# Patient Record
Sex: Female | Born: 1955
Health system: Southern US, Community
[De-identification: ages and names within clinical notes are randomized; demographics above are authoritative.]

## PROBLEM LIST (undated history)

## (undated) DIAGNOSIS — H269 Unspecified cataract: Secondary | ICD-10-CM

## (undated) DIAGNOSIS — I10 Essential (primary) hypertension: Secondary | ICD-10-CM

## (undated) DIAGNOSIS — E785 Hyperlipidemia, unspecified: Secondary | ICD-10-CM

## (undated) DIAGNOSIS — M1990 Unspecified osteoarthritis, unspecified site: Secondary | ICD-10-CM

## (undated) DIAGNOSIS — N189 Chronic kidney disease, unspecified: Secondary | ICD-10-CM

## (undated) HISTORY — PX: CATARACT EXTRACTION: SUR2

## (undated) HISTORY — DX: Chronic kidney disease, unspecified: N18.9

## (undated) HISTORY — PX: ABDOMINAL HYSTERECTOMY: SHX81

## (undated) HISTORY — PX: VAGINAL HYSTERECTOMY: SUR661

## (undated) HISTORY — DX: Unspecified osteoarthritis, unspecified site: M19.90

## (undated) HISTORY — DX: Hyperlipidemia, unspecified: E78.5

## (undated) HISTORY — DX: Unspecified cataract: H26.9

## (undated) HISTORY — PX: EYE SURGERY: SHX253

---

## 1999-06-28 DIAGNOSIS — I10 Essential (primary) hypertension: Secondary | ICD-10-CM

## 1999-06-28 HISTORY — DX: Essential (primary) hypertension: I10

## 1999-12-20 ENCOUNTER — Encounter: Admission: RE | Admit: 1999-12-20 | Discharge: 2000-03-19 | Payer: Self-pay | Admitting: Internal Medicine

## 2000-03-06 ENCOUNTER — Ambulatory Visit (HOSPITAL_COMMUNITY): Admission: RE | Admit: 2000-03-06 | Discharge: 2000-03-06 | Payer: Self-pay | Admitting: Internal Medicine

## 2000-03-06 ENCOUNTER — Encounter: Payer: Self-pay | Admitting: Internal Medicine

## 2000-10-12 ENCOUNTER — Encounter: Payer: Self-pay | Admitting: Internal Medicine

## 2000-10-12 ENCOUNTER — Ambulatory Visit (HOSPITAL_COMMUNITY): Admission: RE | Admit: 2000-10-12 | Discharge: 2000-10-12 | Payer: Self-pay | Admitting: Internal Medicine

## 2001-02-12 ENCOUNTER — Encounter: Payer: Self-pay | Admitting: Internal Medicine

## 2001-02-12 ENCOUNTER — Ambulatory Visit (HOSPITAL_COMMUNITY): Admission: RE | Admit: 2001-02-12 | Discharge: 2001-02-12 | Payer: Self-pay | Admitting: Internal Medicine

## 2002-10-28 ENCOUNTER — Ambulatory Visit (HOSPITAL_COMMUNITY): Admission: RE | Admit: 2002-10-28 | Discharge: 2002-10-28 | Payer: Self-pay | Admitting: Family Medicine

## 2003-03-05 ENCOUNTER — Emergency Department (HOSPITAL_COMMUNITY): Admission: EM | Admit: 2003-03-05 | Discharge: 2003-03-06 | Payer: Self-pay

## 2003-08-07 ENCOUNTER — Emergency Department (HOSPITAL_COMMUNITY): Admission: EM | Admit: 2003-08-07 | Discharge: 2003-08-07 | Payer: Self-pay | Admitting: Emergency Medicine

## 2005-01-14 ENCOUNTER — Ambulatory Visit (HOSPITAL_COMMUNITY): Admission: RE | Admit: 2005-01-14 | Discharge: 2005-01-14 | Payer: Self-pay | Admitting: Obstetrics and Gynecology

## 2005-07-11 ENCOUNTER — Encounter: Admission: RE | Admit: 2005-07-11 | Discharge: 2005-07-11 | Payer: Self-pay | Admitting: Family Medicine

## 2005-08-26 ENCOUNTER — Ambulatory Visit: Payer: Self-pay | Admitting: Family Medicine

## 2005-09-06 ENCOUNTER — Ambulatory Visit: Payer: Self-pay | Admitting: *Deleted

## 2005-09-29 ENCOUNTER — Ambulatory Visit: Payer: Self-pay | Admitting: Family Medicine

## 2005-10-10 ENCOUNTER — Ambulatory Visit: Payer: Self-pay | Admitting: Family Medicine

## 2005-11-04 ENCOUNTER — Ambulatory Visit: Payer: Self-pay | Admitting: Family Medicine

## 2005-11-04 ENCOUNTER — Ambulatory Visit (HOSPITAL_COMMUNITY): Admission: RE | Admit: 2005-11-04 | Discharge: 2005-11-04 | Payer: Self-pay | Admitting: Family Medicine

## 2006-01-23 ENCOUNTER — Ambulatory Visit: Payer: Self-pay | Admitting: Family Medicine

## 2006-01-30 ENCOUNTER — Ambulatory Visit (HOSPITAL_COMMUNITY): Admission: RE | Admit: 2006-01-30 | Discharge: 2006-01-30 | Payer: Self-pay | Admitting: Family Medicine

## 2006-02-20 ENCOUNTER — Ambulatory Visit: Payer: Self-pay | Admitting: Family Medicine

## 2006-06-06 ENCOUNTER — Encounter (INDEPENDENT_AMBULATORY_CARE_PROVIDER_SITE_OTHER): Payer: Self-pay | Admitting: Family Medicine

## 2006-06-06 ENCOUNTER — Ambulatory Visit: Payer: Self-pay | Admitting: Family Medicine

## 2006-08-11 ENCOUNTER — Ambulatory Visit: Payer: Self-pay | Admitting: Family Medicine

## 2006-10-17 ENCOUNTER — Ambulatory Visit: Payer: Self-pay | Admitting: Family Medicine

## 2007-01-22 ENCOUNTER — Ambulatory Visit: Payer: Self-pay | Admitting: Family Medicine

## 2007-01-22 LAB — CONVERTED CEMR LAB
BUN: 15 mg/dL (ref 6–23)
Chloride: 102 meq/L (ref 96–112)
Cholesterol: 233 mg/dL — ABNORMAL HIGH (ref 0–200)
Creatinine, Ser: 0.75 mg/dL (ref 0.40–1.20)
HDL: 51 mg/dL (ref >39)
LDL Cholesterol: 139 mg/dL — ABNORMAL HIGH (ref 0–99)
Potassium: 4 meq/L (ref 3.5–5.3)
Sodium: 137 meq/L (ref 135–145)
Total CHOL/HDL Ratio: 4.6
Triglycerides: 213 mg/dL — ABNORMAL HIGH (ref <150)
VLDL: 43 mg/dL — ABNORMAL HIGH (ref 0–40)

## 2007-03-14 ENCOUNTER — Encounter (INDEPENDENT_AMBULATORY_CARE_PROVIDER_SITE_OTHER): Payer: Self-pay | Admitting: *Deleted

## 2007-05-04 ENCOUNTER — Ambulatory Visit: Payer: Self-pay | Admitting: Internal Medicine

## 2007-05-21 ENCOUNTER — Ambulatory Visit: Payer: Self-pay | Admitting: Internal Medicine

## 2007-06-11 ENCOUNTER — Ambulatory Visit: Payer: Self-pay | Admitting: Family Medicine

## 2007-08-15 ENCOUNTER — Ambulatory Visit: Payer: Self-pay | Admitting: Internal Medicine

## 2007-09-10 ENCOUNTER — Ambulatory Visit: Payer: Self-pay | Admitting: Family Medicine

## 2007-09-10 LAB — CONVERTED CEMR LAB: Microalb, Ur: 1.03 mg/dL (ref 0.00–1.89)

## 2007-11-05 ENCOUNTER — Ambulatory Visit: Payer: Self-pay | Admitting: Internal Medicine

## 2007-11-05 ENCOUNTER — Encounter (INDEPENDENT_AMBULATORY_CARE_PROVIDER_SITE_OTHER): Payer: Self-pay | Admitting: Family Medicine

## 2007-11-05 LAB — CONVERTED CEMR LAB
ALT: 39 units/L — ABNORMAL HIGH (ref 0–35)
Albumin: 4.5 g/dL (ref 3.5–5.2)
BUN: 10 mg/dL (ref 6–23)
Potassium: 4.5 meq/L (ref 3.5–5.3)
Sodium: 141 meq/L (ref 135–145)
Total Bilirubin: 0.5 mg/dL (ref 0.3–1.2)
Total Protein: 7.3 g/dL (ref 6.0–8.3)
Triglycerides: 219 mg/dL — ABNORMAL HIGH (ref <150)

## 2007-11-14 ENCOUNTER — Ambulatory Visit: Payer: Self-pay | Admitting: Family Medicine

## 2007-12-10 ENCOUNTER — Ambulatory Visit: Payer: Self-pay | Admitting: Internal Medicine

## 2008-01-09 ENCOUNTER — Ambulatory Visit: Payer: Self-pay | Admitting: Family Medicine

## 2008-01-09 LAB — CONVERTED CEMR LAB
ALT: 46 units/L — ABNORMAL HIGH (ref 0–35)
AST: 39 units/L — ABNORMAL HIGH (ref 0–37)
Cholesterol: 209 mg/dL — ABNORMAL HIGH (ref 0–200)
Indirect Bilirubin: 0.3 mg/dL (ref 0.0–0.9)
Total Bilirubin: 0.4 mg/dL (ref 0.3–1.2)
Total CHOL/HDL Ratio: 3.9
Total Protein: 7.3 g/dL (ref 6.0–8.3)

## 2008-02-13 ENCOUNTER — Ambulatory Visit: Payer: Self-pay | Admitting: Internal Medicine

## 2008-03-11 ENCOUNTER — Ambulatory Visit: Payer: Self-pay | Admitting: Family Medicine

## 2008-03-25 ENCOUNTER — Ambulatory Visit: Payer: Self-pay | Admitting: Internal Medicine

## 2008-04-03 ENCOUNTER — Ambulatory Visit: Payer: Self-pay | Admitting: Internal Medicine

## 2008-04-25 ENCOUNTER — Ambulatory Visit: Payer: Self-pay | Admitting: Family Medicine

## 2008-06-12 ENCOUNTER — Ambulatory Visit: Payer: Self-pay | Admitting: Family Medicine

## 2008-07-14 ENCOUNTER — Ambulatory Visit: Payer: Self-pay | Admitting: Internal Medicine

## 2008-07-25 ENCOUNTER — Ambulatory Visit: Payer: Self-pay | Admitting: Internal Medicine

## 2008-08-04 ENCOUNTER — Encounter (INDEPENDENT_AMBULATORY_CARE_PROVIDER_SITE_OTHER): Payer: Self-pay | Admitting: Family Medicine

## 2008-08-04 ENCOUNTER — Ambulatory Visit: Payer: Self-pay | Admitting: Internal Medicine

## 2008-08-04 LAB — CONVERTED CEMR LAB
CO2: 25 meq/L (ref 19–32)
Calcium: 9.4 mg/dL (ref 8.4–10.5)
Chloride: 105 meq/L (ref 96–112)
Creatinine, Ser: 0.78 mg/dL (ref 0.40–1.20)
HDL: 58 mg/dL (ref >39)
Sodium: 141 meq/L (ref 135–145)
VLDL: 23 mg/dL (ref 0–40)

## 2008-08-05 ENCOUNTER — Ambulatory Visit: Payer: Self-pay | Admitting: Family Medicine

## 2008-08-08 ENCOUNTER — Ambulatory Visit (HOSPITAL_COMMUNITY): Admission: RE | Admit: 2008-08-08 | Discharge: 2008-08-08 | Payer: Self-pay | Admitting: Family Medicine

## 2008-08-11 ENCOUNTER — Ambulatory Visit (HOSPITAL_COMMUNITY): Admission: RE | Admit: 2008-08-11 | Discharge: 2008-08-11 | Payer: Self-pay | Admitting: Family Medicine

## 2008-09-05 ENCOUNTER — Ambulatory Visit: Payer: Self-pay | Admitting: Family Medicine

## 2008-12-18 ENCOUNTER — Ambulatory Visit: Payer: Self-pay | Admitting: Family Medicine

## 2009-03-11 ENCOUNTER — Telehealth (INDEPENDENT_AMBULATORY_CARE_PROVIDER_SITE_OTHER): Payer: Self-pay | Admitting: *Deleted

## 2009-03-13 ENCOUNTER — Ambulatory Visit (HOSPITAL_COMMUNITY): Admission: RE | Admit: 2009-03-13 | Discharge: 2009-03-13 | Payer: Self-pay | Admitting: Family Medicine

## 2009-03-13 ENCOUNTER — Ambulatory Visit: Payer: Self-pay | Admitting: Internal Medicine

## 2009-04-24 ENCOUNTER — Ambulatory Visit: Payer: Self-pay | Admitting: Family Medicine

## 2009-05-01 ENCOUNTER — Telehealth (INDEPENDENT_AMBULATORY_CARE_PROVIDER_SITE_OTHER): Payer: Self-pay | Admitting: *Deleted

## 2009-05-04 ENCOUNTER — Telehealth (INDEPENDENT_AMBULATORY_CARE_PROVIDER_SITE_OTHER): Payer: Self-pay | Admitting: *Deleted

## 2009-05-05 ENCOUNTER — Ambulatory Visit: Payer: Self-pay | Admitting: Internal Medicine

## 2009-05-14 ENCOUNTER — Ambulatory Visit: Payer: Self-pay | Admitting: Internal Medicine

## 2009-06-23 ENCOUNTER — Ambulatory Visit: Payer: Self-pay | Admitting: Family Medicine

## 2009-06-23 LAB — CONVERTED CEMR LAB
ALT: 15 units/L (ref 0–35)
Albumin: 4.8 g/dL (ref 3.5–5.2)
Alkaline Phosphatase: 51 units/L (ref 39–117)
Calcium: 9.9 mg/dL (ref 8.4–10.5)
Chloride: 105 meq/L (ref 96–112)
Sodium: 142 meq/L (ref 135–145)
Total Protein: 7.6 g/dL (ref 6.0–8.3)

## 2009-08-07 ENCOUNTER — Ambulatory Visit: Payer: Self-pay | Admitting: Family Medicine

## 2009-10-09 ENCOUNTER — Ambulatory Visit: Payer: Self-pay | Admitting: Family Medicine

## 2010-01-05 ENCOUNTER — Ambulatory Visit: Payer: Self-pay | Admitting: Family Medicine

## 2010-01-05 LAB — CONVERTED CEMR LAB
Albumin: 4.4 g/dL (ref 3.5–5.2)
Alkaline Phosphatase: 53 units/L (ref 39–117)
BUN: 13 mg/dL (ref 6–23)
CO2: 23 meq/L (ref 19–32)
Chloride: 103 meq/L (ref 96–112)
Cholesterol: 159 mg/dL (ref 0–200)
HDL: 45 mg/dL (ref >39)
LDL Cholesterol: 74 mg/dL (ref 0–99)
Sodium: 140 meq/L (ref 135–145)
Total Protein: 7.3 g/dL (ref 6.0–8.3)
VLDL: 40 mg/dL (ref 0–40)

## 2010-01-13 ENCOUNTER — Ambulatory Visit (HOSPITAL_COMMUNITY): Admission: RE | Admit: 2010-01-13 | Discharge: 2010-01-13 | Payer: Self-pay | Admitting: Internal Medicine

## 2010-01-19 ENCOUNTER — Ambulatory Visit: Payer: Self-pay | Admitting: Family Medicine

## 2010-04-12 ENCOUNTER — Encounter (INDEPENDENT_AMBULATORY_CARE_PROVIDER_SITE_OTHER): Payer: Self-pay | Admitting: Family Medicine

## 2010-04-12 LAB — CONVERTED CEMR LAB
HDL: 58 mg/dL (ref >39)
Hgb A1c MFr Bld: 10.7 % — ABNORMAL HIGH (ref <5.7)
Total CHOL/HDL Ratio: 2.5

## 2010-04-15 ENCOUNTER — Ambulatory Visit: Payer: Self-pay | Admitting: Family Medicine

## 2010-06-17 ENCOUNTER — Encounter (INDEPENDENT_AMBULATORY_CARE_PROVIDER_SITE_OTHER): Payer: Self-pay | Admitting: Family Medicine

## 2010-06-17 LAB — CONVERTED CEMR LAB: Free T4: 1.06 ng/dL (ref 0.80–1.80)

## 2010-07-27 NOTE — Miscellaneous (Signed)
Summary: VIP  Patient: Babs Bertin Note: All result statuses are Final unless otherwise noted.  Tests: (1) VIP (Medications)   LLIMPORTMEDS              "Result Below..."       RESULT: *USE AS DIRECTED*12/11/2006*Last Refill: [id number]*******   LLIMPORTMEDS              "Result Below..."       RESULT: MUCINEX D TB12 60-600 MG*TAKE ONE (1) TABLET TWICE DAILY*08/24/2006*Last Refill: [id number]*******   LLIMPORTMEDS              "Result Below..."       RESULT: JANUVIA TABS 100 MG*TAKE ONE (1) TABLET EACH DAY*03/12/2007*Last Refill: Unknown*[id number]*******   LLIMPORTMEDS              "Result Below..."       RESULT: GLYBURIDE-METFORMIN TABS 5-500 MG*TAKE 2 TABLETS TWICE DAILY WITH MEALS   Generic for GLUCOVANCE 5-500MG *03/12/2007*Last Refill: [id number]*******   LLIMPORTALLS              NKDA***  Note: An exclamation mark (!) indicates a result that was not dispersed into the flowsheet. Document Creation Date: 04/26/2007 3:00 PM _______________________________________________________________________  (1) Order result status: Final Collection or observation date-time: 03/14/2007 Requested date-time: 03/14/2007 Receipt date-time:  Reported date-time: 03/14/2007 Referring Physician:   Ordering Physician:   Specimen Source:  Source: [facility] [place] Order Number:  Lab site:

## 2011-05-02 ENCOUNTER — Other Ambulatory Visit (HOSPITAL_COMMUNITY): Payer: Self-pay | Admitting: Family Medicine

## 2011-05-02 DIAGNOSIS — Z1231 Encounter for screening mammogram for malignant neoplasm of breast: Secondary | ICD-10-CM

## 2011-05-30 ENCOUNTER — Ambulatory Visit (HOSPITAL_COMMUNITY)
Admission: RE | Admit: 2011-05-30 | Discharge: 2011-05-30 | Disposition: A | Discharge status: Home / Self Care | Payer: Self-pay | Source: Ambulatory Visit | Attending: Family Medicine | Admitting: Family Medicine

## 2011-05-30 DIAGNOSIS — Z1231 Encounter for screening mammogram for malignant neoplasm of breast: Secondary | ICD-10-CM

## 2011-06-10 ENCOUNTER — Ambulatory Visit (HOSPITAL_COMMUNITY)
Admission: RE | Admit: 2011-06-10 | Discharge: 2011-06-10 | Disposition: A | Discharge status: Home / Self Care | Payer: Self-pay | Source: Ambulatory Visit | Attending: Family Medicine | Admitting: Family Medicine

## 2011-06-10 ENCOUNTER — Other Ambulatory Visit (HOSPITAL_COMMUNITY): Payer: Self-pay | Admitting: Family Medicine

## 2011-06-10 DIAGNOSIS — R05 Cough: Secondary | ICD-10-CM

## 2011-06-10 DIAGNOSIS — M79609 Pain in unspecified limb: Secondary | ICD-10-CM | POA: Insufficient documentation

## 2011-06-10 DIAGNOSIS — R053 Chronic cough: Secondary | ICD-10-CM

## 2011-06-10 DIAGNOSIS — M79646 Pain in unspecified finger(s): Secondary | ICD-10-CM

## 2011-06-10 DIAGNOSIS — R0602 Shortness of breath: Secondary | ICD-10-CM | POA: Insufficient documentation

## 2012-03-19 ENCOUNTER — Emergency Department (INDEPENDENT_AMBULATORY_CARE_PROVIDER_SITE_OTHER)
Admission: EM | Admit: 2012-03-19 | Discharge: 2012-03-19 | Disposition: A | Discharge status: Home / Self Care | Payer: Self-pay | Source: Home / Self Care | Attending: Family Medicine | Admitting: Family Medicine

## 2012-03-19 ENCOUNTER — Encounter (HOSPITAL_COMMUNITY): Payer: Self-pay

## 2012-03-19 DIAGNOSIS — Z760 Encounter for issue of repeat prescription: Secondary | ICD-10-CM

## 2012-03-19 HISTORY — DX: Essential (primary) hypertension: I10

## 2012-03-19 NOTE — ED Notes (Addendum)
Patient left upset, states that we did nothing for her, she wanted forms filled out for free lantus and to have it shipped her, when she found out we did not have meds shipped here she wanted a flu shot which we do not have, once advised she wanted her 20.00 copay back and was told to call supervisor, stated she did not want a number to call and left upset, did not want to wait for CBG

## 2012-03-19 NOTE — ED Provider Notes (Signed)
History     CSN: [id number]  Arrival date & time 03/19/12  Kimberly [name]   First MD Initiated Contact with Patient 03/19/12 1945      Chief Complaint  Patient presents with  . Medication Refill    (Consider location/radiation/quality/duration/timing/severity/associated sxs/prior treatment) HPI Comments: [facility].  She has been out of her lantus insulin  For 7 weeks.  She has brought paper work that "if you fill it out out they will send me a 90 day supply.  They won't send it to my house so they will have to send it here".  She can't afford to pay to get established with a doctor.    i went and spoke with dr. Juventino Slovak about the logistics of this interaction and the proposed arrangements.  He states that we can not have the medicine sent here and then distribute it to her.   i went back and explained this to the pt.  She is very angry that "i paid $20 dollars just to find out you guys can't help me?"  i want my money back.  i offered to write an rx for insulin but pt says she can't afford to get it filled.  The history is provided by the patient. No language interpreter was used.    Past Medical History  Diagnosis Date  . Diabetes mellitus   . Hypertension     Past Surgical History  Procedure Date  . Abdominal hysterectomy     No family history on file.  History  Substance Use Topics  . Smoking status: Never Smoker   . Smokeless tobacco: Not on file  . Alcohol Use: No    OB History    Grav Para Term Preterm Abortions TAB SAB Ect Mult Living                  Review of Systems  Constitutional: Negative for fever and chills.  All other systems reviewed and are negative.    Allergies  Review of patient's allergies indicates no known allergies.  Home Medications   Current Outpatient Rx  Name Route Sig Dispense Refill  . GLYBURIDE 5 MG PO TABS Oral Take 5 mg by mouth 2 (two) times daily.    . INSULIN GLARGINE 100 UNIT/ML  SOLN Subcutaneous Inject into  the skin. Inject 40 units the morning, and 40 units in the evening    . METFORMIN HCL 1000 MG PO TABS Oral Take 1,000 mg by mouth 2 (two) times daily with a meal.      BP 125/65  Pulse 95  Temp 98.2 F (36.8 C) (Oral)  Resp 16  SpO2 96%  Physical Exam  Vitals reviewed. Constitutional: She is oriented to person, place, and time. She appears well-developed and well-nourished.  HENT:  Head: Normocephalic.  Eyes: EOM are normal.  Neck: Normal range of motion.  Pulmonary/Chest: Effort normal.  Abdominal: She exhibits no distension.  Musculoskeletal: Normal range of motion.  Neurological: She is alert and oriented to person, place, and time.  Psychiatric: She has a normal mood and affect.    ED Course  Procedures (including critical care time)  Labs Reviewed - No data to display No results found.   1. Medication refill     No physical exam done.  MDM  Find a PCP ASAP        Jennye Boroughs, PA 03/19/12 2036

## 2012-03-19 NOTE — ED Notes (Addendum)
Patient is here today for a medication refill, states she was a [facility] patient and she needs to have forms filled out to receive her Lantus Solostar Insulin Pens, says that she has been without insulin for 7 weeks, says if possible she would like her labs done too A1C last I found in records was 8.7 on 04/21/11

## 2012-03-23 NOTE — ED Provider Notes (Signed)
Medical screening examination/treatment/procedure(s) were performed by resident physician or non-physician practitioner and as supervising physician I was immediately available for consultation/collaboration.   Pauline Good MD.    Billy Fischer, MD 03/23/12 0930

## 2012-03-26 ENCOUNTER — Emergency Department (INDEPENDENT_AMBULATORY_CARE_PROVIDER_SITE_OTHER)
Admission: EM | Admit: 2012-03-26 | Discharge: 2012-03-26 | Disposition: A | Discharge status: Home / Self Care | Payer: Self-pay | Source: Home / Self Care

## 2012-03-26 ENCOUNTER — Encounter (HOSPITAL_COMMUNITY): Payer: Self-pay | Admitting: Emergency Medicine

## 2012-03-26 DIAGNOSIS — R197 Diarrhea, unspecified: Secondary | ICD-10-CM

## 2012-03-26 DIAGNOSIS — E119 Type 2 diabetes mellitus without complications: Secondary | ICD-10-CM

## 2012-03-26 DIAGNOSIS — N898 Other specified noninflammatory disorders of vagina: Secondary | ICD-10-CM

## 2012-03-26 LAB — POCT I-STAT, CHEM 8
Calcium, Ion: 1.21 mmol/L (ref 1.12–1.23)
Chloride: 106 mEq/L (ref 96–112)
Creatinine, Ser: 0.8 mg/dL (ref 0.50–1.10)
HCT: 45 % (ref 36.0–46.0)
Hemoglobin: 15.3 g/dL — ABNORMAL HIGH (ref 12.0–15.0)
Potassium: 3.9 mEq/L (ref 3.5–5.1)
TCO2: 24 mmol/L (ref 0–100)

## 2012-03-26 LAB — POCT URINALYSIS DIP (DEVICE)
Bilirubin Urine: NEGATIVE
Specific Gravity, Urine: >=1.03 (ref 1.005–1.030)
Urobilinogen, UA: 0.2 mg/dL (ref 0.0–1.0)

## 2012-03-26 LAB — WET PREP, GENITAL: Trich, Wet Prep: NONE SEEN

## 2012-03-26 MED ORDER — FLUCONAZOLE 150 MG PO TABS: ORAL_TABLET | ORAL | Status: DC

## 2012-03-26 NOTE — ED Notes (Signed)
Patient reports having diarrhea that started on 9/18.  Patient reports since then she did take pepto bismal, diarrhea seemed to slow down, but has increased in frequency again.  Reports 3 episodes of diarrhea today.  Reports nausea, no vomiting. Patient reports she is a diabetic.  Patient reports cbg 254.  Patient reports she is out of lantus--out for 2 months

## 2012-03-26 NOTE — ED Notes (Signed)
Pelvic equipment at bedside

## 2012-03-26 NOTE — ED Provider Notes (Signed)
Medical screening examination/treatment/procedure(s) were performed by resident physician or non-physician practitioner and as supervising physician I was immediately available for consultation/collaboration.   Pauline Good MD.    Billy Fischer, MD 03/26/12 2114

## 2012-03-26 NOTE — ED Provider Notes (Addendum)
History     CSN: [id number]  Arrival date & time 03/26/12  1304   None     Chief Complaint  Patient presents with  . Diarrhea    (Consider location/radiation/quality/duration/timing/severity/associated sxs/prior treatment) HPI Comments: 56 year old female [facility] presents with chief complaint of diarrhea since mid-September and stomach ache. Just states she is tight blood sugars. She is a type II diabetic has been out of her oral hypoglycemic agents and insulin for 2 months. She denies upper respiratory symptoms fever or chills. States her blood sugar this morning was 254 at home. Her next complaint is nothing to have a yeast infection describing a cottage cheeselike vaginal discharge associated with vulvovaginal itching.  Patient is a 56 y.o. female presenting with diarrhea. The history is provided by the patient.  Diarrhea The primary symptoms include fatigue, abdominal pain, nausea and diarrhea. Primary symptoms do not include fever, hematemesis, jaundice, dysuria or rash.    Past Medical History  Diagnosis Date  . Diabetes mellitus   . Hypertension     Past Surgical History  Procedure Date  . Abdominal hysterectomy     No family history on file.  History  Substance Use Topics  . Smoking status: Never Smoker   . Smokeless tobacco: Not on file  . Alcohol Use: No    OB History    Grav Para Term Preterm Abortions TAB SAB Ect Mult Living                  Review of Systems  Constitutional: Positive for fatigue. Negative for fever.  HENT: Negative.   Respiratory: Negative.   Cardiovascular: Negative.   Gastrointestinal: Positive for nausea, abdominal pain and diarrhea. Negative for hematemesis and jaundice.  Genitourinary: Negative for dysuria.  Skin: Negative.  Negative for rash.    Allergies  Review of patient's allergies indicates no known allergies.  Home Medications   Current Outpatient Rx  Name Route Sig Dispense Refill  .  HYDROCHLOROTHIAZIDE PO Oral Take by mouth.    Marland Kitchen FLUCONAZOLE 150 MG PO TABS  1 tab po x 1. May repeat in 72 hours if no improvement 2 tablet 0  . GLYBURIDE 5 MG PO TABS Oral Take 5 mg by mouth 2 (two) times daily.    . INSULIN GLARGINE 100 UNIT/ML [place] SOLN Subcutaneous Inject into the skin. Inject 40 units the morning, and 40 units in the evening    . METFORMIN HCL 1000 MG PO TABS Oral Take 1,000 mg by mouth 2 (two) times daily with a meal.      BP 134/73  Pulse 94  Temp 98.2 F (36.8 C) (Oral)  Resp 20  SpO2 100%  Physical Exam  Constitutional: She is oriented to person, place, and time. She appears well-developed and well-nourished. No distress.  Neck: Normal range of motion. Neck supple.  Cardiovascular: Normal rate, regular rhythm and normal heart sounds.   Pulmonary/Chest: Effort normal and breath sounds normal. No respiratory distress. She has no wheezes.  Abdominal: Soft. Bowel sounds are normal. She exhibits no distension and no mass. There is tenderness. There is no rebound and no guarding.       Abdomen normoactive bowel sounds. Mild periumbilical tenderness. No guarding or rebound. No palpable masses.  Lymphadenopathy:    She has no cervical adenopathy.  Neurological: She is alert and oriented to person, place, and time.  Skin: Skin is warm and dry. No erythema.    ED Course  Procedures (including critical  care time)  Labs Reviewed  POCT URINALYSIS DIP (DEVICE) - Abnormal; Notable for the following:    Glucose, UA 500 (*)     Ketones, ur 15 (*)     Hgb urine dipstick TRACE (*)     All other components within normal limits  POCT I-STAT, CHEM 8 - Abnormal; Notable for the following:    Glucose, Bld 232 (*)     Hemoglobin 15.3 (*)     All other components within normal limits  GI PATHOGEN PANEL BY PCR, STOOL  GC/CHLAMYDIA PROBE AMP, GENITAL  WET PREP, GENITAL   No results found.   1. Diarrhea   2. T2DM (type 2 diabetes mellitus)   3. Vaginal Discharge        MDM  Imodium A-D as directed for diarrhea. He did not take enough sufficient to stop the diarrhea most only checked. Should have some diarrhea to shed the virus organism that may be causing this. A prescription for insulin was written by Dr. Ruben Gottron prior to the patient checking in. Diflucan 150 mg one now repeat in 2 days. Am attempting to obtain stool specimen for pathology for diarrhea. She was able to produce a stool however it was well formed no diarrhea. It is questionable as to whether the lab will be able to obtain sufficient specimen for the test. I-STAT values were normal with exception of blood sugar which was 232. She is to take her insulin as directed 40 units PID. Tx vag D/C with clue cells with flagyl 500 bid for 7 d Results for orders placed during the hospital encounter of 03/26/12  POCT URINALYSIS DIP (DEVICE)      Component Value Range   Glucose, UA 500 (*) NEGATIVE mg/dL   Bilirubin Urine NEGATIVE  NEGATIVE   Ketones, ur 15 (*) NEGATIVE mg/dL   Specific Gravity, Urine >=1.030  1.005 - 1.030   Hgb urine dipstick TRACE (*) NEGATIVE   pH 5.0  5.0 - 8.0   Protein, ur NEGATIVE  NEGATIVE mg/dL   Urobilinogen, UA 0.2  0.0 - 1.0 mg/dL   Nitrite NEGATIVE  NEGATIVE   Leukocytes, UA NEGATIVE  NEGATIVE  POCT I-STAT, CHEM 8      Component Value Range   Sodium 141  135 - 145 mEq/L   Potassium 3.9  3.5 - 5.1 mEq/L   Chloride 106  96 - 112 mEq/L   BUN 16  6 - 23 mg/dL   Creatinine, Ser 0.80  0.50 - 1.10 mg/dL   Glucose, Bld 232 (*) 70 - 99 mg/dL   Calcium, Ion 1.21  1.12 - 1.23 mmol/L   TCO2 24  0 - 100 mmol/L   Hemoglobin 15.3 (*) 12.0 - 15.0 g/dL   HCT 45.0  36.0 - 46.0 %    Results for orders placed during the hospital encounter of 03/26/12  POCT URINALYSIS DIP (DEVICE)      Component Value Range   Glucose, UA 500 (*) NEGATIVE mg/dL   Bilirubin Urine NEGATIVE  NEGATIVE   Ketones, ur 15 (*) NEGATIVE mg/dL   Specific Gravity, Urine >=1.030  1.005 - 1.030   Hgb  urine dipstick TRACE (*) NEGATIVE   pH 5.0  5.0 - 8.0   Protein, ur NEGATIVE  NEGATIVE mg/dL   Urobilinogen, UA 0.2  0.0 - 1.0 mg/dL   Nitrite NEGATIVE  NEGATIVE   Leukocytes, UA NEGATIVE  NEGATIVE  POCT I-STAT, CHEM 8      Component Value Range   Sodium 141  135 - 145 mEq/L   Potassium 3.9  3.5 - 5.1 mEq/L   Chloride 106  96 - 112 mEq/L   BUN 16  6 - 23 mg/dL   Creatinine, Ser 0.80  0.50 - 1.10 mg/dL   Glucose, Bld 232 (*) 70 - 99 mg/dL   Calcium, Ion 1.21  1.12 - 1.23 mmol/L   TCO2 24  0 - 100 mmol/L   Hemoglobin 15.3 (*) 12.0 - 15.0 g/dL   HCT 45.0  36.0 - 46.0 %  GI PATHOGEN PANEL BY PCR, STOOL      Component Value Range   Campylobacter by PCR Negative     C difficile toxin A/B Negative     E coli 0157 by PCR Negative     E coli (ETEC) LT/ST Negative     E coli (STEC) Negative     Salmonella by PCR Negative     Shigella by PCR Negative     Norovirus G!/G2 Negative     Rotavirus A by PCR Negative     G lamblia by PCR Negative     Cryptosporidium by PCR Negative    GC/CHLAMYDIA PROBE AMP, GENITAL      Component Value Range   GC Probe Amp, Genital NEGATIVE  NEGATIVE   Chlamydia, DNA Probe NEGATIVE  NEGATIVE  WET PREP, GENITAL      Component Value Range   Yeast Wet Prep HPF POC NONE SEEN  NONE SEEN   Trich, Wet Prep NONE SEEN  NONE SEEN   Clue Cells Wet Prep HPF POC MANY (*) NONE SEEN   WBC, Wet Prep HPF POC NONE SEEN  NONE SEEN           Janne Napoleon, NP 03/26/12 1642  Janne Napoleon, NP 03/30/12 [phone]

## 2012-03-26 NOTE — ED Notes (Signed)
Patient reports intermittent, generalized abdominal pain.  Vaginal discharge "cottoge cheese" , odor, and itching.  [facility], does not have a physician since they closed.

## 2012-03-26 NOTE — ED Notes (Signed)
Sent patient to bathroom for urine specimen

## 2012-03-27 LAB — GC/CHLAMYDIA PROBE AMP, GENITAL
Chlamydia, DNA Probe: NEGATIVE
GC Probe Amp, Genital: NEGATIVE

## 2012-03-28 LAB — GI PATHOGEN PANEL BY PCR, STOOL
Cryptosporidium by PCR: NEGATIVE
E coli (STEC): NEGATIVE
E coli 0157 by PCR: NEGATIVE
Norovirus GI/GII: NEGATIVE

## 2012-03-29 MED ORDER — METRONIDAZOLE 500 MG PO TABS: 500.0000 mg | ORAL_TABLET | Freq: Two times a day (BID) | ORAL | Status: DC

## 2012-03-30 ENCOUNTER — Telehealth (HOSPITAL_COMMUNITY): Payer: Self-pay | Admitting: *Deleted

## 2012-03-30 NOTE — ED Notes (Unsigned)
I called pt.  Pt. verified x 2 and given results.  Pt. told she needs a Rx. of Flagyl.   Pt. instructed to no alcohol while taking this medication.  Pt. wants Rx. called to the [facility]. I told her they were closed now and would not open until Mon.  She said to call it on on Mon., because she can't afford to get it until then anyway. I told her I would call it in.  Rx. called to [facility] voicemail @ [phone]. Roselyn Meier. 03/30/2012

## 2012-04-02 NOTE — ED Provider Notes (Signed)
Medical screening examination/treatment/procedure(s) were performed by resident physician or non-physician practitioner and as supervising physician I was immediately available for consultation/collaboration.   Pauline Good MD.    Billy Fischer, MD 04/02/12 2055

## 2012-04-30 ENCOUNTER — Ambulatory Visit: Payer: Self-pay | Admitting: Family Medicine

## 2012-05-14 ENCOUNTER — Ambulatory Visit (INDEPENDENT_AMBULATORY_CARE_PROVIDER_SITE_OTHER): Payer: Self-pay | Admitting: Family Medicine

## 2012-05-14 ENCOUNTER — Encounter: Payer: Self-pay | Admitting: Family Medicine

## 2012-05-14 VITALS — BP 140/82 | HR 95 | Temp 98.0°F | Ht 71.0 in | Wt 196.0 lb

## 2012-05-14 DIAGNOSIS — E1142 Type 2 diabetes mellitus with diabetic polyneuropathy: Secondary | ICD-10-CM | POA: Insufficient documentation

## 2012-05-14 DIAGNOSIS — I10 Essential (primary) hypertension: Secondary | ICD-10-CM

## 2012-05-14 DIAGNOSIS — E663 Overweight: Secondary | ICD-10-CM

## 2012-05-14 DIAGNOSIS — E785 Hyperlipidemia, unspecified: Secondary | ICD-10-CM

## 2012-05-14 DIAGNOSIS — E1140 Type 2 diabetes mellitus with diabetic neuropathy, unspecified: Secondary | ICD-10-CM | POA: Insufficient documentation

## 2012-05-14 DIAGNOSIS — E1165 Type 2 diabetes mellitus with hyperglycemia: Secondary | ICD-10-CM | POA: Insufficient documentation

## 2012-05-14 DIAGNOSIS — Z6825 Body mass index (BMI) 25.0-25.9, adult: Secondary | ICD-10-CM

## 2012-05-14 DIAGNOSIS — E119 Type 2 diabetes mellitus without complications: Secondary | ICD-10-CM

## 2012-05-14 DIAGNOSIS — Z23 Encounter for immunization: Secondary | ICD-10-CM

## 2012-05-14 DIAGNOSIS — E1129 Type 2 diabetes mellitus with other diabetic kidney complication: Secondary | ICD-10-CM | POA: Insufficient documentation

## 2012-05-14 DIAGNOSIS — E049 Nontoxic goiter, unspecified: Secondary | ICD-10-CM

## 2012-05-14 MED ORDER — INSULIN GLARGINE 100 UNIT/ML ~~LOC~~ SOLN: 40.0000 [IU] | Freq: Two times a day (BID) | SUBCUTANEOUS | Status: DC

## 2012-05-14 MED ORDER — GLYBURIDE 5 MG PO TABS: 5.0000 mg | ORAL_TABLET | Freq: Two times a day (BID) | ORAL | Status: DC

## 2012-05-14 MED ORDER — METFORMIN HCL 1000 MG PO TABS: 1000.0000 mg | ORAL_TABLET | Freq: Two times a day (BID) | ORAL | Status: DC

## 2012-05-14 MED ORDER — HYDROCHLOROTHIAZIDE 25 MG PO TABS: 25.0000 mg | ORAL_TABLET | Freq: Every day | ORAL | Status: DC

## 2012-05-14 NOTE — Progress Notes (Signed)
Subjective:     Patient ID: Kimberly [name], female   DOB: Apr 24, 1956, 56 y.o.   MRN: [id number]  HPI Kimberly [name] is a 56 yo F previous [facility] patient who presents to establish care and f/u diabetes.   1. Diabetes: x 12 years. On insulin. Was taking 40 U lantus BID. Ran out 3 days ago. She is checking CBGs Fasting: 95-136 Post prandial: low 64, 118-188 She denies visual changes, chest pain, GI upset.   2. HTN: x 12 years. taking HCTZ. Was on and ACE inhibitor as well but she was d/cd due to chronic cough in 2012.   Past Medical History  Diagnosis Date  . Diabetes mellitus 2001  . Hypertension 2001   Review of Systems As per HPI    Objective:   Physical Exam BP 140/82  Pulse 95  Temp 98 F (36.7 C) (Oral)  Ht 5\' 11"  (1.803 m)  Wt 196 lb (88.905 kg)  BMI 27.34 kg/m2 General appearance: alert, cooperative and no distress Neck: no adenopathy, no carotid bruit, no JVD, supple, symmetrical, trachea midline and thyroid: enlarged and L lobe, non tender, no palpable nodules  Lungs: clear to auscultation bilaterally Heart: regular rate and rhythm, S1, S2 normal, no murmur, click, rub or gallop Extremities: extremities normal, atraumatic, no cyanosis or edema    Assessment and Plan:

## 2012-05-14 NOTE — Patient Instructions (Addendum)
Kimberly [name],  Thank you for coming in today.  It is a pleasure meeting you.  Please take the prescription for lantus to the [facility]. They will order and dispense it from there.   I will call with lab results.   Dr. Adrian Blackwater

## 2012-05-15 LAB — COMPREHENSIVE METABOLIC PANEL
Albumin: 4.7 g/dL (ref 3.5–5.2)
Alkaline Phosphatase: 54 U/L (ref 39–117)
BUN: 15 mg/dL (ref 6–23)
CO2: 29 mEq/L (ref 19–32)
Calcium: 10.2 mg/dL (ref 8.4–10.5)
Glucose, Bld: 229 mg/dL — ABNORMAL HIGH (ref 70–99)
Potassium: 3.9 mEq/L (ref 3.5–5.3)
Sodium: 139 mEq/L (ref 135–145)
Total Protein: 7.5 g/dL (ref 6.0–8.3)

## 2012-05-15 LAB — LDL CHOLESTEROL, DIRECT: Direct LDL: 153 mg/dL — ABNORMAL HIGH

## 2012-05-16 ENCOUNTER — Encounter: Payer: Self-pay | Admitting: Family Medicine

## 2012-05-16 DIAGNOSIS — E663 Overweight: Secondary | ICD-10-CM | POA: Insufficient documentation

## 2012-05-16 DIAGNOSIS — E785 Hyperlipidemia, unspecified: Secondary | ICD-10-CM | POA: Insufficient documentation

## 2012-05-16 DIAGNOSIS — I10 Essential (primary) hypertension: Secondary | ICD-10-CM | POA: Insufficient documentation

## 2012-05-16 DIAGNOSIS — E049 Nontoxic goiter, unspecified: Secondary | ICD-10-CM | POA: Insufficient documentation

## 2012-05-16 NOTE — Assessment & Plan Note (Signed)
A: enlarged L lobe. No evidence of hypo or hyperthyroidism  P: Review previous records.  Check TSH. Consider thyroid ultrasound.

## 2012-05-16 NOTE — Assessment & Plan Note (Signed)
A: BP elevated above goal. Patient intolerant of ACE inhibitors. P:  Recheck at f/u.  Start norvasc if BP still elevated at f/u.

## 2012-05-16 NOTE — Assessment & Plan Note (Addendum)
A: improved. A1c trending down.  P: Refilled lantus.  The patient would get better glycemic control for a short acting insulin. Will investigate purchasing this from the [employer] program.  Encouraged regular exercise.  Will check LDL today. Goal < 100.

## 2012-05-16 NOTE — Assessment & Plan Note (Signed)
A: LDL above goal.  P: start statin since LDL > 130.

## 2012-05-23 ENCOUNTER — Telehealth: Payer: Self-pay | Admitting: *Deleted

## 2012-05-23 NOTE — Telephone Encounter (Signed)
Message left on our office voicemail.  Patient had labs drawn on 05/14/12 (cholesterol & thyroid).  Has not heard back from our office and would like lab results.  Will route request to Dr. Adrian Blackwater and blue team.  Nolene Ebbs, RN

## 2012-05-28 MED ORDER — PRAVASTATIN SODIUM 40 MG PO TABS: 40.0000 mg | ORAL_TABLET | Freq: Every day | ORAL | Status: DC

## 2012-05-28 NOTE — Telephone Encounter (Signed)
Called patient about lab results.  There was no TSH. LDL elevated. Plan to start pravastatin 40 mg daily. Recheck cholesterol in 6 months. Will send letter with lab results

## 2012-11-16 ENCOUNTER — Telehealth: Payer: Self-pay | Admitting: Family Medicine

## 2012-11-16 MED ORDER — ASPIRIN EC 81 MG PO TBEC: 81.0000 mg | DELAYED_RELEASE_TABLET | Freq: Every day | ORAL | Status: DC

## 2012-11-16 NOTE — Telephone Encounter (Signed)
Called patient to discuss medications. Left VM. ? Is she taking pravastatin? If not why? If she can we can get it from [employer]. ? Starting ARB. Recommend starting daily aspirin 82 mg EC daily. It is OTC.   Asked for call back.  Need info to complete [facility] form from [facility].

## 2013-01-01 NOTE — Telephone Encounter (Signed)
Called patient Overdue for diabetes f/u. Asked patient to please call in to schedule f/u appt within the next month.

## 2013-05-25 ENCOUNTER — Other Ambulatory Visit: Payer: Self-pay | Admitting: Family Medicine

## 2013-07-27 ENCOUNTER — Ambulatory Visit: Payer: No Typology Code available for payment source | Attending: Internal Medicine | Admitting: Family Medicine

## 2013-07-27 ENCOUNTER — Encounter: Payer: Self-pay | Admitting: Family Medicine

## 2013-07-27 VITALS — BP 128/76 | HR 76 | Temp 98.2°F | Ht 71.0 in | Wt 200.8 lb

## 2013-07-27 DIAGNOSIS — Z0000 Encounter for general adult medical examination without abnormal findings: Secondary | ICD-10-CM

## 2013-07-27 DIAGNOSIS — E119 Type 2 diabetes mellitus without complications: Secondary | ICD-10-CM

## 2013-07-27 DIAGNOSIS — I10 Essential (primary) hypertension: Secondary | ICD-10-CM

## 2013-07-27 DIAGNOSIS — E785 Hyperlipidemia, unspecified: Secondary | ICD-10-CM

## 2013-07-27 DIAGNOSIS — E049 Nontoxic goiter, unspecified: Secondary | ICD-10-CM

## 2013-07-27 LAB — LIPID PANEL
CHOL/HDL RATIO: 4.9 ratio
CHOLESTEROL: 183 mg/dL (ref 0–200)
HDL: 37 mg/dL — ABNORMAL LOW (ref >39)
LDL Cholesterol: 73 mg/dL (ref 0–99)
TRIGLYCERIDES: 365 mg/dL — AB (ref <150)
VLDL: 73 mg/dL — AB (ref 0–40)

## 2013-07-27 LAB — COMPREHENSIVE METABOLIC PANEL
ALK PHOS: 44 U/L (ref 39–117)
ALT: 40 U/L — ABNORMAL HIGH (ref 0–35)
AST: 43 U/L — AB (ref 0–37)
Albumin: 4 g/dL (ref 3.5–5.2)
BILIRUBIN TOTAL: 0.3 mg/dL (ref 0.2–1.2)
BUN: 12 mg/dL (ref 6–23)
CO2: 27 mEq/L (ref 19–32)
CREATININE: 0.82 mg/dL (ref 0.50–1.10)
Calcium: 9.4 mg/dL (ref 8.4–10.5)
Chloride: 104 mEq/L (ref 96–112)
Glucose, Bld: 162 mg/dL — ABNORMAL HIGH (ref 70–99)
Potassium: 3.9 mEq/L (ref 3.5–5.3)
Sodium: 141 mEq/L (ref 135–145)
Total Protein: 6.7 g/dL (ref 6.0–8.3)

## 2013-07-27 LAB — TSH: TSH: 0.611 u[IU]/mL (ref 0.350–4.500)

## 2013-07-27 LAB — POCT GLYCOSYLATED HEMOGLOBIN (HGB A1C): Hemoglobin A1C: 9.3

## 2013-07-27 MED ORDER — GLIMEPIRIDE 4 MG PO TABS: 8.0000 mg | ORAL_TABLET | Freq: Every day | ORAL | Status: DC

## 2013-07-27 MED ORDER — GLIMEPIRIDE 4 MG PO TABS: 4.0000 mg | ORAL_TABLET | Freq: Every day | ORAL | Status: DC

## 2013-07-27 MED ORDER — LOSARTAN POTASSIUM 50 MG PO TABS: 50.0000 mg | ORAL_TABLET | Freq: Every day | ORAL | Status: DC

## 2013-07-27 MED ORDER — INSULIN GLARGINE 100 UNIT/ML SOLOSTAR PEN: 50.0000 [IU] | PEN_INJECTOR | Freq: Every day | SUBCUTANEOUS | Status: DC

## 2013-07-27 MED ORDER — BLOOD GLUCOSE METER KIT: PACK | Status: DC

## 2013-07-27 MED ORDER — ROSUVASTATIN CALCIUM 10 MG PO TABS: 10.0000 mg | ORAL_TABLET | ORAL | Status: DC

## 2013-07-27 NOTE — Progress Notes (Signed)
   Subjective:    Patient ID: Kimberly [name], female    DOB: 1956/04/03, 58 y.o.   MRN: [id number]  HPI 58 yo F new patient presents to establish care and discuss the following:  1. CHRONIC DIABETES  Disease Monitoring  Blood Sugar Ranges: not checked   Polyuria: no   Visual problems: no   Medication Compliance: yes. lantus 40 U BID and orals.  Patient gets lantus solostar from the company she is enrolled in medication assistance. Recently received a 3 month supply  Medication Side Effects  Hypoglycemia: no symptoms  Weight gain: around abdomen   Preventitive Health Care  Eye Exam: due   Foot Exam: done today   Diet pattern: low carb, does not skip meals   Exercise: not done   2. HLD- compliant with crestor 10 mg once weekly. Had myalgias with lipitor and crestor 20. Still gets intermittent leg cramps. Taking HCTZ. Unsure of last BMP.   3 L axillary irritation: x 3 months. Tenderness and mild itching. Also on the R. No improvement after changing deodorant. No swelling. No fever, chills, weight loss.    4. HM: due for mammogram, colonoscopy, pap (not needed, patient reports hysterectomy for heavy vaginal bleeding only).   Review of Systems As per HPI  Denies HA, vision changes, CP, SOB and palpitations     Objective:   Physical Exam BP 128/76  Pulse 76  Temp(Src) 98.2 F (36.8 C) (Oral)  Ht 5\' 11"  (1.803 m)  Wt 200 lb 12.8 oz (91.082 kg)  BMI 28.02 kg/m2 General appearance: alert, cooperative and no distress, thin African American woman  Neck: no adenopathy, no carotid bruit, no JVD, supple, symmetrical, trachea midline and thyroid: enlarged  L>R Lungs: clear to auscultation bilaterally Heart: regular rate and rhythm, S1, S2 normal, no murmur, click, rub or gallop Abdomen: soft, non-tender; bowel sounds normal; no masses,  no organomegaly Pelvic: external genitalia normal,  vagina with scant white mucoid discharge, cervix surgically absent, uterus surgically absent. No  adnexal mass or fullness.   Pap not done-absent cervix  Lab Results  Component Value Date   HGBA1C 9.3 07/27/2013   Diabetic foot exam done and documented:  Dorsal foot calluses noted  R-1st MTP and 2nd MTP  L-1st PIP, 2nd MTP    Assessment & Plan:

## 2013-07-27 NOTE — Assessment & Plan Note (Signed)
Noted on exam. tsh ordered.

## 2013-07-27 NOTE — Assessment & Plan Note (Signed)
A: Tolerating low dose crestor. P: Lipid panel Increase crestor to 10 mg every other day

## 2013-07-27 NOTE — Patient Instructions (Addendum)
Please contact Kimberly [name] with the [employer] (pharmacy assistance)  program on Monday to change the the recipient address to our clinic for your lantus.   In the meantime, I think we can decrease your insulin dose and maxmize oral medication. Decrease lantus to 50 U once daily  Increase diabeta to two pills twice daily. Once you are out of diabeta start amaryl one pill daily with plan to increase to two pills daily if your blood sugar does not get too low.  Continue metformin.   For BP: stop HCTZ start cozaar.   We have free meters. Lancets $2 Strips $6 Start checking sugars twice daily- In the morning fasting and afternoon after meals.   F/u with chris via phone [phone]  I will be in touch with lab results.  We will get the records from your hysterectomy.   Dr. Adrian Blackwater

## 2013-07-27 NOTE — Assessment & Plan Note (Signed)
A: at goal. Patient with DM2 taking HCTZ. Had cough with ACEi P: BMP Start arb F/u BMP in 2-4 weeks (monitor Cr)

## 2013-07-27 NOTE — Assessment & Plan Note (Addendum)
A: poor control. Goal A1c <7.  Large dose of [place] lantus, increase truncal adiposity. No s/s of retinopathy, nephropathy or significant neuropathy P: Decrease lantus to 50 U q HS Continue metformin Change from diabeta to amaryl (safer longterm) Negotiated an increase of crestor to 10 mg every other day Eye exam- to be done at [facility] Will need U Pr/Cr to quantitate risk of diabetic nephropathy  Ordered new meter today.

## 2013-07-30 ENCOUNTER — Telehealth: Payer: Self-pay | Admitting: Family Medicine

## 2013-07-30 ENCOUNTER — Encounter: Payer: Self-pay | Admitting: Family Medicine

## 2013-07-30 NOTE — Telephone Encounter (Signed)
Called patient Reviewed results. Informed her that letter was sent. She asked about records. Informed her that I have no update on records at this time.

## 2013-08-07 ENCOUNTER — Other Ambulatory Visit: Payer: Self-pay | Admitting: Emergency Medicine

## 2013-08-07 MED ORDER — SIMVASTATIN 40 MG PO TABS: 40.0000 mg | ORAL_TABLET | Freq: Every day | ORAL | Status: DC

## 2013-09-20 ENCOUNTER — Encounter: Payer: Self-pay | Admitting: Internal Medicine

## 2013-09-20 ENCOUNTER — Ambulatory Visit: Payer: No Typology Code available for payment source | Attending: Internal Medicine | Admitting: Internal Medicine

## 2013-09-20 VITALS — BP 136/76 | HR 73 | Temp 97.9°F | Resp 16 | Ht 71.0 in | Wt 199.0 lb

## 2013-09-20 DIAGNOSIS — E119 Type 2 diabetes mellitus without complications: Secondary | ICD-10-CM

## 2013-09-20 DIAGNOSIS — E785 Hyperlipidemia, unspecified: Secondary | ICD-10-CM

## 2013-09-20 DIAGNOSIS — I10 Essential (primary) hypertension: Secondary | ICD-10-CM

## 2013-09-20 MED ORDER — BUDESONIDE-FORMOTEROL FUMARATE 160-4.5 MCG/ACT IN AERO: 2.0000 | INHALATION_SPRAY | Freq: Two times a day (BID) | RESPIRATORY_TRACT | Status: DC

## 2013-09-20 MED ORDER — AZITHROMYCIN 250 MG PO TABS: ORAL_TABLET | ORAL | Status: DC

## 2013-09-20 NOTE — Progress Notes (Signed)
Pt here to f/u with medication change on BP meds. States Losartan was causing cough,sob. Stopped taking 2 weeks ago. Restarted HCTZ 25 mg CBG 136 this am at home

## 2013-09-20 NOTE — Progress Notes (Signed)
Patient ID: Kimberly [name], female   DOB: 01-30-1956, 58 y.o.   MRN: [id number]  CC: Cough  HPI: 58 year old female with past medical history significant for hypertension, diabetes, dyslipidemia who presented to clinic for followup. Patient reports having persistent cough and she noticed this with taking losartan. No fevers or chills. She did have cold a few days ago. She reports using Symbicort to help her with cough and occasional shortness of breath.  Allergies  Allergen Reactions  . Ace Inhibitors Cough   Past Medical History  Diagnosis Date  . Diabetes mellitus 2001  . Hypertension 2001   Current Outpatient Prescriptions on File Prior to Visit  Medication Sig Dispense Refill  . aspirin EC 81 MG tablet Take 1 tablet (81 mg total) by mouth daily.      Marland Kitchen glimepiride (AMARYL) 4 MG tablet Take 2 tablets (8 mg total) by mouth daily with breakfast.  60 tablet  1  . Insulin Glargine (LANTUS) 100 UNIT/ML Solostar Pen Inject 50 Units into the skin daily at 10 pm.  15 mL    . metFORMIN (GLUCOPHAGE) 1000 MG tablet TAKE 1 TABLET (1,000 MG TOTAL) BY MOUTH 2 (TWO) TIMES DAILY WITH A MEAL.  60 tablet  0  . simvastatin (ZOCOR) 40 MG tablet Take 1 tablet (40 mg total) by mouth at bedtime.  90 tablet  3  . Blood Glucose Monitoring Suppl (BLOOD GLUCOSE METER) kit Use as instructed  1 each  0  . losartan (COZAAR) 50 MG tablet Take 1 tablet (50 mg total) by mouth daily.  90 tablet  3   No current facility-administered medications on file prior to visit.   Family history significant for hypertension in mother.  History   Social History  . Marital Status: Divorced    Spouse Name: N/A    Number of Children: N/A  . Years of Education: N/A   Occupational History  . Not on file.   Social History Main Topics  . Smoking status: Never Smoker   . Smokeless tobacco: Not on file  . Alcohol Use: No  . Drug Use: No  . Sexual Activity:    Other Topics Concern  . Not on file   Social History Narrative   . No narrative on file    Review of Systems  Constitutional: Negative for fever, chills, diaphoresis, activity change, appetite change and fatigue.  HENT: Negative for ear pain, nosebleeds, congestion, facial swelling, rhinorrhea, neck pain, neck stiffness and ear discharge.   Eyes: Negative for pain, discharge, redness, itching and visual disturbance.  Respiratory: Positive for cough, negative for choking, chest tightness, shortness of breath, wheezing and stridor.   Cardiovascular: Negative for chest pain, palpitations and leg swelling.  Gastrointestinal: Negative for abdominal distention.  Genitourinary: Negative for dysuria, urgency, frequency, hematuria, flank pain, decreased urine volume, difficulty urinating and dyspareunia.  Musculoskeletal: Negative for back pain, joint swelling, arthralgias and gait problem.  Neurological: Negative for dizziness, tremors, seizures, syncope, facial asymmetry, speech difficulty, weakness, light-headedness, numbness and headaches.  Hematological: Negative for adenopathy. Does not bruise/bleed easily.  Psychiatric/Behavioral: Negative for hallucinations, behavioral problems, confusion, dysphoric mood, decreased concentration and agitation.    Objective:   Filed Vitals:   09/20/13 0953  BP: 136/76  Pulse: 73  Temp: 97.9 F (36.6 C)  Resp: 16    Physical Exam  Constitutional: Appears well-developed and well-nourished. No distress.  HENT: Normocephalic. External right and left ear normal. Oropharynx is clear and moist.  Eyes: Conjunctivae and EOM are normal.  PERRLA, no scleral icterus.  Neck: Normal ROM. Neck supple. No JVD. No tracheal deviation. No thyromegaly.  CVS: RRR, S1/S2 +, no murmurs, no gallops, no carotid bruit.  Pulmonary: Effort and breath sounds normal, no stridor, rhonchi, wheezes, rales.  Abdominal: Soft. BS +,  no distension, tenderness, rebound or guarding.  Musculoskeletal: Normal range of motion. No edema and no  tenderness.  Lymphadenopathy: No lymphadenopathy noted, cervical, inguinal. Neuro: Alert. Normal reflexes, muscle tone coordination. No cranial nerve deficit. Skin: Skin is warm and dry. No rash noted. Not diaphoretic. No erythema. No pallor.  Psychiatric: Normal mood and affect. Behavior, judgment, thought content normal.   Lab Results  Component Value Date   HGB 15.3* 03/26/2012   HCT 45.0 03/26/2012   Lab Results  Component Value Date   CREATININE 0.82 07/27/2013   BUN 12 07/27/2013   NA 141 07/27/2013   K 3.9 07/27/2013   CL 104 07/27/2013   CO2 27 07/27/2013    Lab Results  Component Value Date   HGBA1C 9.3 07/27/2013   Lipid Panel     Component Value Date/Time   CHOL 183 07/27/2013 1243   TRIG 365* 07/27/2013 1243   HDL 37* 07/27/2013 1243   CHOLHDL 4.9 07/27/2013 1243   VLDL 73* 07/27/2013 1243   LDLCALC 73 07/27/2013 1243       Assessment and plan:   Patient Active Problem List   Diagnosis Date Noted  . Hypertension 05/16/2012    Priority: Medium - Patient was on HCTZ and losartan. She did not take losartan because of the cough. Her blood pressure is stable during this visit. Patient will continue HCTZ for right now and was instructed to come back in one month so we can recheck the blood pressure. Stop losartan  - We have discussed target BP range - I have advised pt to check BP regularly and to call us back if the numbers are higher than 140/90 - discussed the importance of compliance with medical therapy and diet   . Hyperlipidemia LDL goal < 100 05/16/2012    Priority: Medium - Continue statin therapy   . DM type 2 (diabetes mellitus, type 2) 05/14/2012    Priority: Medium - Next A1c due April 30 first 2015. Previous A1c in January was 9.3 indicating poor glycemic control  - Patient is on Amaryl, Lantus, metformin  - Podiatry and ophthalmology referral   .  Mild bronchitis  - Start azithromycin, 500 mg tablet today and then 250 mg for next 3 days after that  -  Continue Symbicort  05/16/2012

## 2013-09-20 NOTE — Addendum Note (Signed)
Addended by: Luberta Mutter R on: 09/20/2013 10:16 AM   Modules accepted: Orders, Medications

## 2013-09-20 NOTE — Patient Instructions (Signed)

## 2013-09-30 ENCOUNTER — Ambulatory Visit (INDEPENDENT_AMBULATORY_CARE_PROVIDER_SITE_OTHER): Payer: No Typology Code available for payment source | Admitting: Home Health Services

## 2013-09-30 DIAGNOSIS — E119 Type 2 diabetes mellitus without complications: Secondary | ICD-10-CM

## 2013-09-30 NOTE — Progress Notes (Signed)
DIABETES Pt came in to have a retinal scan per diabetic care.   Image was taken and submitted to [facility]-DR. Cathren Laine for reading.    Results will be available in 1-2 weeks.  Results will be given to PCP for review and to contact patient.  [name]

## 2013-10-18 ENCOUNTER — Ambulatory Visit: Payer: No Typology Code available for payment source | Admitting: Internal Medicine

## 2013-10-23 ENCOUNTER — Ambulatory Visit (HOSPITAL_COMMUNITY)
Admission: RE | Admit: 2013-10-23 | Discharge: 2013-10-23 | Disposition: A | Discharge status: Home / Self Care | Payer: No Typology Code available for payment source | Source: Ambulatory Visit | Attending: Family Medicine | Admitting: Family Medicine

## 2013-10-23 ENCOUNTER — Other Ambulatory Visit: Payer: Self-pay | Admitting: Family Medicine

## 2013-10-23 DIAGNOSIS — Z1231 Encounter for screening mammogram for malignant neoplasm of breast: Secondary | ICD-10-CM

## 2013-10-23 DIAGNOSIS — Z0000 Encounter for general adult medical examination without abnormal findings: Secondary | ICD-10-CM

## 2013-11-28 ENCOUNTER — Encounter: Payer: Self-pay | Admitting: Internal Medicine

## 2013-11-28 ENCOUNTER — Ambulatory Visit: Payer: No Typology Code available for payment source | Attending: Internal Medicine | Admitting: Internal Medicine

## 2013-11-28 ENCOUNTER — Other Ambulatory Visit: Payer: Self-pay | Admitting: Emergency Medicine

## 2013-11-28 VITALS — BP 144/91 | HR 80 | Temp 98.2°F | Resp 17 | Wt 181.0 lb

## 2013-11-28 DIAGNOSIS — Z7982 Long term (current) use of aspirin: Secondary | ICD-10-CM | POA: Insufficient documentation

## 2013-11-28 DIAGNOSIS — E785 Hyperlipidemia, unspecified: Secondary | ICD-10-CM | POA: Insufficient documentation

## 2013-11-28 DIAGNOSIS — Z794 Long term (current) use of insulin: Secondary | ICD-10-CM | POA: Insufficient documentation

## 2013-11-28 DIAGNOSIS — E119 Type 2 diabetes mellitus without complications: Secondary | ICD-10-CM | POA: Insufficient documentation

## 2013-11-28 DIAGNOSIS — I10 Essential (primary) hypertension: Secondary | ICD-10-CM | POA: Insufficient documentation

## 2013-11-28 LAB — POCT GLYCOSYLATED HEMOGLOBIN (HGB A1C): Hemoglobin A1C: 9.3

## 2013-11-28 LAB — GLUCOSE, POCT (MANUAL RESULT ENTRY): POC GLUCOSE: 169 mg/dL — AB (ref 70–99)

## 2013-11-28 MED ORDER — METFORMIN HCL 1000 MG PO TABS: ORAL_TABLET | ORAL | Status: DC

## 2013-11-28 MED ORDER — GLYBURIDE 5 MG PO TABS: 5.0000 mg | ORAL_TABLET | Freq: Two times a day (BID) | ORAL | Status: DC

## 2013-11-28 MED ORDER — HYDROCHLOROTHIAZIDE 25 MG PO TABS: 25.0000 mg | ORAL_TABLET | Freq: Every day | ORAL | Status: DC

## 2013-11-28 MED ORDER — SIMVASTATIN 20 MG PO TABS
20.0000 mg | ORAL_TABLET | Freq: Every day | ORAL | Status: DC
Start: 2013-11-28 — End: 2014-07-24

## 2013-11-28 MED ORDER — SIMVASTATIN 20 MG PO TABS: 20.0000 mg | ORAL_TABLET | Freq: Every day | ORAL | Status: DC

## 2013-11-28 MED ORDER — METFORMIN HCL 1000 MG PO TABS
ORAL_TABLET | ORAL | Status: DC
Start: 2013-11-28 — End: 2014-06-30

## 2013-11-28 NOTE — Progress Notes (Signed)
Patient here for follow up on her DM 

## 2013-11-28 NOTE — Progress Notes (Signed)
Patient ID: Kimberly [name], female   DOB: May 30, 1956, 58 y.o.   MRN: [id number]  CC: f/u DM  HPI: Patient reports that she currently checks BS at home twice weekly ranging from 117 to 200.  Patient was given Amaryl her at past visit but reports that it made her sick so she switched to glyburide 5 mg that she had daily.  She reports that she had some left over glyburide at home from before.  She has been out of the glyburide for three weeks. She reports that she only takes 40 units of Lantus daily because she feels her sugars are normal.  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   Allergies  Allergen Reactions  . Ace Inhibitors Cough   Past Medical History  Diagnosis Date  . Diabetes mellitus 2001  . Hypertension 2001   Current Outpatient Prescriptions on File Prior to Visit  Medication Sig Dispense Refill  . aspirin EC 81 MG tablet Take 1 tablet (81 mg total) by mouth daily.      Marland Kitchen azithromycin (ZITHROMAX) 250 MG tablet Take 2 pills today 3/27 and then 1 tablet daily until completed  6 tablet  0  . Blood Glucose Monitoring Suppl (BLOOD GLUCOSE METER) kit Use as instructed  1 each  0  . budesonide-formoterol (SYMBICORT) 160-4.5 MCG/ACT inhaler Inhale 2 puffs into the lungs 2 (two) times daily.  1 Inhaler  3  . glimepiride (AMARYL) 4 MG tablet Take 2 tablets (8 mg total) by mouth daily with breakfast.  60 tablet  1  . hydrochlorothiazide (HYDRODIURIL) 25 MG tablet Take 25 mg by mouth daily.      . Insulin Glargine (LANTUS) 100 UNIT/ML Solostar Pen Inject 50 Units into the skin daily at 10 pm.  15 mL    . losartan (COZAAR) 50 MG tablet Take 1 tablet (50 mg total) by mouth daily.  90 tablet  3  . metFORMIN (GLUCOPHAGE) 1000 MG tablet TAKE 1 TABLET (1,000 MG TOTAL) BY MOUTH 2 (TWO) TIMES DAILY WITH A MEAL.  60 tablet  0  . simvastatin (ZOCOR) 40 MG tablet Take 1 tablet (40 mg total) by mouth at bedtime.   90 tablet  3   No current facility-administered medications on file prior to visit.   History reviewed. No pertinent family history. History   Social History  . Marital Status: Divorced    Spouse Name: N/A    Number of Children: N/A  . Years of Education: N/A   Occupational History  . Not on file.   Social History Main Topics  . Smoking status: Never Smoker   . Smokeless tobacco: Not on file  . Alcohol Use: No  . Drug Use: No  . Sexual Activity:    Other Topics Concern  . Not on file   Social History Narrative  . No narrative on file   Review of Systems  Constitutional: Negative.   HENT: Negative.   Eyes: Negative.   Neurological: Negative.   Endo/Heme/Allergies: Negative for polydipsia.     Objective:   Filed Vitals:   11/28/13 1722  BP: 144/91  Pulse: 80  Temp: 98.2 F (36.8 C)  Resp: 17   Physical Exam  Constitutional: She is oriented to person, place, and time.  Neck: Normal range of motion. Neck supple. No JVD present.  Cardiovascular: Normal rate, regular rhythm and normal heart sounds.   Pulmonary/Chest: Effort normal  and breath sounds normal.  Abdominal: Soft. Bowel sounds are normal.  Musculoskeletal: Normal range of motion. She exhibits no edema and no tenderness.  Lymphadenopathy:    She has no cervical adenopathy.  Neurological: She is alert and oriented to person, place, and time. She has normal reflexes.  Skin: Skin is warm and dry.  Psychiatric: She has a normal mood and affect. Thought content normal.     Lab Results  Component Value Date   HGB 15.3* 03/26/2012   HCT 45.0 03/26/2012   Lab Results  Component Value Date   CREATININE 0.82 07/27/2013   BUN 12 07/27/2013   NA 141 07/27/2013   K 3.9 07/27/2013   CL 104 07/27/2013   CO2 27 07/27/2013    Lab Results  Component Value Date   HGBA1C 9.3 11/28/2013   Lipid Panel     Component Value Date/Time   CHOL 183 07/27/2013 1243   TRIG 365* 07/27/2013 1243   HDL 37* 07/27/2013 1243    CHOLHDL 4.9 07/27/2013 1243   VLDL 73* 07/27/2013 1243   LDLCALC 73 07/27/2013 1243       Assessment and plan:   Katlyne was seen today for follow-up.  Diagnoses and associated orders for this visit:  DM (diabetes mellitus) - Glucose (CBG) - HgB A1c - glyBURIDE (DIABETA) 5 MG tablet; Take 1 tablet (5 mg total) by mouth 2 (two) times daily with a meal. - metFORMIN (GLUCOPHAGE) 1000 MG tablet; TAKE 1 TABLET (1,000 MG TOTAL) BY MOUTH 2 (TWO) TIMES DAILY WITH A MEAL.  HLD (hyperlipidemia) - simvastatin (ZOCOR) 20 MG tablet; Take 1 tablet (20 mg total) by mouth at bedtime.  HTN (hypertension) - hydrochlorothiazide (HYDRODIURIL) 25 MG tablet; Take 1 tablet (25 mg total) by mouth daily.  Follow up in 3 months for HTN and DM.      Lance Bosch, [facility] [phone] 12/01/2013, 9:24 PM

## 2014-02-24 ENCOUNTER — Ambulatory Visit: Payer: Self-pay | Attending: Internal Medicine | Admitting: Internal Medicine

## 2014-02-24 ENCOUNTER — Encounter: Payer: Self-pay | Admitting: Internal Medicine

## 2014-02-24 VITALS — BP 128/82 | HR 76 | Temp 98.0°F | Resp 16

## 2014-02-24 DIAGNOSIS — E089 Diabetes mellitus due to underlying condition without complications: Secondary | ICD-10-CM | POA: Insufficient documentation

## 2014-02-24 DIAGNOSIS — Z1211 Encounter for screening for malignant neoplasm of colon: Secondary | ICD-10-CM

## 2014-02-24 DIAGNOSIS — E139 Other specified diabetes mellitus without complications: Secondary | ICD-10-CM

## 2014-02-24 DIAGNOSIS — H538 Other visual disturbances: Secondary | ICD-10-CM | POA: Insufficient documentation

## 2014-02-24 DIAGNOSIS — E785 Hyperlipidemia, unspecified: Secondary | ICD-10-CM | POA: Insufficient documentation

## 2014-02-24 DIAGNOSIS — I10 Essential (primary) hypertension: Secondary | ICD-10-CM | POA: Insufficient documentation

## 2014-02-24 DIAGNOSIS — E119 Type 2 diabetes mellitus without complications: Secondary | ICD-10-CM | POA: Insufficient documentation

## 2014-02-24 LAB — POCT GLYCOSYLATED HEMOGLOBIN (HGB A1C): HEMOGLOBIN A1C: 11.2

## 2014-02-24 LAB — GLUCOSE, POCT (MANUAL RESULT ENTRY): POC GLUCOSE: 271 mg/dL — AB (ref 70–99)

## 2014-02-24 NOTE — Progress Notes (Signed)
Patient here for follow up on her diabetes 

## 2014-02-24 NOTE — Patient Instructions (Signed)
Diabetes Mellitus and Food It is important for you to manage your blood sugar (glucose) level. Your blood glucose level can be greatly affected by what you eat. Eating healthier foods in the appropriate amounts throughout the day at about the same time each day will help you control your blood glucose level. It can also help slow or prevent worsening of your diabetes mellitus. Healthy eating may even help you improve the level of your blood pressure and reach or maintain a healthy weight.  HOW CAN FOOD AFFECT ME? Carbohydrates Carbohydrates affect your blood glucose level more than any other type of food. Your dietitian will help you determine how many carbohydrates to eat at each meal and teach you how to count carbohydrates. Counting carbohydrates is important to keep your blood glucose at a healthy level, especially if you are using insulin or taking certain medicines for diabetes mellitus. Alcohol Alcohol can cause sudden decreases in blood glucose (hypoglycemia), especially if you use insulin or take certain medicines for diabetes mellitus. Hypoglycemia can be a life-threatening condition. Symptoms of hypoglycemia (sleepiness, dizziness, and disorientation) are similar to symptoms of having too much alcohol.  If your health care provider has given you approval to drink alcohol, do so in moderation and use the following guidelines:  Women should not have more than one drink per day, and men should not have more than two drinks per day. One drink is equal to:  12 oz of beer.  5 oz of wine.  1 oz of hard liquor.  Do not drink on an empty stomach.  Keep yourself hydrated. Have water, diet soda, or unsweetened iced tea.  Regular soda, juice, and other mixers might contain a lot of carbohydrates and should be counted. WHAT FOODS ARE NOT RECOMMENDED? As you make food choices, it is important to remember that all foods are not the same. Some foods have fewer nutrients per serving than other  foods, even though they might have the same number of calories or carbohydrates. It is difficult to get your body what it needs when you eat foods with fewer nutrients. Examples of foods that you should avoid that are high in calories and carbohydrates but low in nutrients include:  Trans fats (most processed foods list trans fats on the Nutrition Facts label).  Regular soda.  Juice.  Candy.  Sweets, such as cake, pie, doughnuts, and cookies.  Fried foods. WHAT FOODS CAN I EAT? Have nutrient-rich foods, which will nourish your body and keep you healthy. The food you should eat also will depend on several factors, including:  The calories you need.  The medicines you take.  Your weight.  Your blood glucose level.  Your blood pressure level.  Your cholesterol level. You also should eat a variety of foods, including:  Protein, such as meat, poultry, fish, tofu, nuts, and seeds (lean animal proteins are best).  Fruits.  Vegetables.  Dairy products, such as milk, cheese, and yogurt (low fat is best).  Breads, grains, pasta, cereal, rice, and beans.  Fats such as olive oil, trans fat-free margarine, canola oil, avocado, and olives. DOES EVERYONE WITH DIABETES MELLITUS HAVE THE SAME MEAL PLAN? Because every person with diabetes mellitus is different, there is not one meal plan that works for everyone. It is very important that you meet with a dietitian who will help you create a meal plan that is just right for you. Document Released: 03/10/2005 Document Revised: 06/18/2013 Document Reviewed: 05/10/2013 Exit[employer] Patient Information 2015 [employer]. This   information is not intended to replace advice given to you by your health care provider. Make sure you discuss any questions you have with your health care provider.  

## 2014-02-24 NOTE — Progress Notes (Signed)
MRN: [id number] Name: Kimberly [name]  Sex: female Age: 58 y.o. DOB: 1955-08-30  Allergies: Ace inhibitors  Chief Complaint  Patient presents with  . Follow-up    HPI: Patient is 58 y.o. female who has to of diabetes hypertension hyperlipidemia comes today for followup, for diabetes she has been taking Lantus 40 units each bedtime taking glyburide 5 mg twice a day as well as metformin 1 g twice a day, she denies any hypoglycemic symptoms her blood sugar has been uncontrolled, her A1c is trending up, I have advised patient for diet modification, her blood pressure is well controlled she denies any headache dizziness chest and shortness of breath, she is up-to-date with her mammogram and never had colonoscopy done. Patient reported to have occasional numbness tingling in the feet.  Past Medical History  Diagnosis Date  . Diabetes mellitus 2001  . Hypertension 2001    Past Surgical History  Procedure Laterality Date  . Abdominal hysterectomy        Medication List       This list is accurate as of: 02/24/14  5:49 PM.  Always use your most recent med list.               aspirin EC 81 MG tablet  Take 1 tablet (81 mg total) by mouth daily.     azithromycin 250 MG tablet  Commonly known as:  ZITHROMAX  Take 2 pills today 3/27 and then 1 tablet daily until completed     blood glucose meter kit and supplies  Use as instructed     budesonide-formoterol 160-4.5 MCG/ACT inhaler  Commonly known as:  SYMBICORT  Inhale 2 puffs into the lungs 2 (two) times daily.     glyBURIDE 5 MG tablet  Commonly known as:  DIABETA  Take 1 tablet (5 mg total) by mouth 2 (two) times daily with a meal.     hydrochlorothiazide 25 MG tablet  Commonly known as:  HYDRODIURIL  Take 1 tablet (25 mg total) by mouth daily.     Insulin Glargine 100 UNIT/ML Solostar Pen  Commonly known as:  LANTUS  Inject 50 Units into the skin daily at 10 pm.     losartan 50 MG tablet  Commonly known as:  COZAAR   Take 1 tablet (50 mg total) by mouth daily.     metFORMIN 1000 MG tablet  Commonly known as:  GLUCOPHAGE  TAKE 1 TABLET (1,000 MG TOTAL) BY MOUTH 2 (TWO) TIMES DAILY WITH A MEAL.     simvastatin 20 MG tablet  Commonly known as:  ZOCOR  Take 1 tablet (20 mg total) by mouth at bedtime.        No orders of the defined types were placed in this encounter.    Immunization History  Administered Date(s) Administered  . Influenza Split 05/14/2012  . Influenza,inj,Quad PF,36+ Mos 07/27/2013    History reviewed. No pertinent family history.  History  Substance Use Topics  . Smoking status: Never Smoker   . Smokeless tobacco: Not on file  . Alcohol Use: No    Review of Systems   As noted in HPI  Filed Vitals:   02/24/14 1733  BP: 128/82  Pulse: 76  Temp: 98 F (36.7 C)  Resp: 16    Physical Exam  Physical Exam  Constitutional: No distress.  Eyes: EOM are normal. Pupils are equal, round, and reactive to light.  Cardiovascular: Normal rate and regular rhythm.   Pulmonary/Chest: Breath sounds normal. No  respiratory distress. She has no wheezes. She has no rales.  Musculoskeletal:  Bilateral feet no ulcer or callus. Normal monofilament test.    CBC    Component Value Date/Time   HGB 15.3* 03/26/2012 1558   HCT 45.0 03/26/2012 1558    CMP     Component Value Date/Time   NA 141 07/27/2013 1243   K 3.9 07/27/2013 1243   CL 104 07/27/2013 1243   CO2 27 07/27/2013 1243   GLUCOSE 162* 07/27/2013 1243   BUN 12 07/27/2013 1243   CREATININE 0.82 07/27/2013 1243   CREATININE 0.80 03/26/2012 1558   CALCIUM 9.4 07/27/2013 1243   PROT 6.7 07/27/2013 1243   ALBUMIN 4.0 07/27/2013 1243   AST 43* 07/27/2013 1243   ALT 40* 07/27/2013 1243   ALKPHOS 44 07/27/2013 1243   BILITOT 0.3 07/27/2013 1243    Lab Results  Component Value Date/Time   CHOL 183 07/27/2013 12:43 PM    No components found with this basename: hga1c    Lab Results  Component Value Date/Time   AST 43*  07/27/2013 12:43 PM    Assessment and Plan  Diabetes mellitus due to underlying condition without complications - Plan:  Results for orders placed in visit on 02/24/14  GLUCOSE, POCT (MANUAL RESULT ENTRY)      Result Value Ref Range   POC Glucose 271 (*) 70 - 99 mg/dl  POCT GLYCOSYLATED HEMOGLOBIN (HGB A1C)      Result Value Ref Range   Hemoglobin A1C 11.2     Diabetes is uncontrolled, I have advised patient for diabetes meal planning, she will increase the dose of Lantus to 50 units, continue with metformin 1 g twice a day, I have discussed with patient about increasing the dose of glyburide, she does not want to do it at this point will consider on the next visit, repeat A1c in 3 months.  Essential hypertension Blood pressure is well controlled continue with current meds.  HLD (hyperlipidemia) Currently patient is on simvastatin, will do fasting lipid panel on the next visit.  Blurry vision - Plan: Ambulatory referral to Ophthalmology  Special screening for malignant neoplasms, colon - Plan: Ambulatory referral to Gastroenterology   Health Maintenance -Colonoscopy: Referred to GI  -Mammogram: Up-to-date   Return in about 3 months (around 05/26/2014) for diabetes, hyperipidemia.  Lorayne Marek, MD

## 2014-03-12 ENCOUNTER — Ambulatory Visit: Payer: Self-pay | Attending: Family Medicine

## 2014-03-12 ENCOUNTER — Ambulatory Visit: Payer: Self-pay

## 2014-03-12 DIAGNOSIS — Z23 Encounter for immunization: Secondary | ICD-10-CM

## 2014-03-25 ENCOUNTER — Other Ambulatory Visit: Payer: Self-pay | Admitting: Internal Medicine

## 2014-03-28 ENCOUNTER — Telehealth: Payer: Self-pay | Admitting: Emergency Medicine

## 2014-03-28 ENCOUNTER — Telehealth: Payer: Self-pay | Admitting: Family Medicine

## 2014-03-28 NOTE — Telephone Encounter (Signed)
Pt called wanting to schedule a same day appointment. She has been having a cough that began about 2 days after her flu shot and its been going on for about two weeks. Please follow up with patient to see if she could be seen sometime today. FYI: Her Chart looks like she has seen Dr. Annitta Needs more often but her PCP is Dr. Adrian Blackwater.

## 2014-03-28 NOTE — Telephone Encounter (Signed)
Left message for pt to call for possible nurse visit

## 2014-04-01 ENCOUNTER — Other Ambulatory Visit: Payer: Self-pay | Admitting: Internal Medicine

## 2014-04-02 ENCOUNTER — Ambulatory Visit: Payer: Self-pay | Attending: Family Medicine | Admitting: Family Medicine

## 2014-04-02 ENCOUNTER — Encounter: Payer: Self-pay | Admitting: Family Medicine

## 2014-04-02 VITALS — BP 124/75 | HR 93 | Temp 98.1°F | Resp 18 | Ht 72.0 in | Wt 195.0 lb

## 2014-04-02 DIAGNOSIS — I10 Essential (primary) hypertension: Secondary | ICD-10-CM

## 2014-04-02 DIAGNOSIS — E119 Type 2 diabetes mellitus without complications: Secondary | ICD-10-CM

## 2014-04-02 DIAGNOSIS — Z01818 Encounter for other preprocedural examination: Secondary | ICD-10-CM | POA: Insufficient documentation

## 2014-04-02 DIAGNOSIS — F419 Anxiety disorder, unspecified: Secondary | ICD-10-CM | POA: Insufficient documentation

## 2014-04-02 DIAGNOSIS — R739 Hyperglycemia, unspecified: Secondary | ICD-10-CM

## 2014-04-02 DIAGNOSIS — F418 Other specified anxiety disorders: Secondary | ICD-10-CM

## 2014-04-02 DIAGNOSIS — Z0000 Encounter for general adult medical examination without abnormal findings: Secondary | ICD-10-CM

## 2014-04-02 LAB — GLUCOSE, POCT (MANUAL RESULT ENTRY): POC Glucose: 227 mg/dl — AB (ref 70–99)

## 2014-04-02 MED ORDER — METOPROLOL SUCCINATE ER 25 MG PO TB24: 25.0000 mg | ORAL_TABLET | Freq: Every day | ORAL | Status: DC

## 2014-04-02 NOTE — Progress Notes (Signed)
Pt state delevated  blood pressure  BP 141/71 at home this morning  BP 185/82 last night  Taking medication as prescribed

## 2014-04-02 NOTE — Patient Instructions (Signed)
Ms. Jeanlouis,  Thank you for coming in to see me today. It was good to see you again.  One medication change, add toprol xl for situational anxiety and to help control BP. Take once daily. I have placed requested referrals including one to behavioral health for counseling.   F/u in 2 weeks with RN for BP check  F/u in 6 week for DM2 f/u with me, you have repeat A1c done at this visit.   Dr. Adrian Blackwater

## 2014-04-02 NOTE — Assessment & Plan Note (Signed)
A: BP at goal P: Continue current regimen Add toprol for anxiety

## 2014-04-02 NOTE — Assessment & Plan Note (Signed)
A: CBGs at goal per report P: F/u in 6 weeks for DM2 f/u with repeat A1c Referral to ophthalmology

## 2014-04-02 NOTE — Assessment & Plan Note (Signed)
GI referral for screening colonoscopy.

## 2014-04-02 NOTE — Assessment & Plan Note (Signed)
A: with driving. Decreasing quality of life.  P: Start beta blocker toprol XL, 25 mg daily  Referral to psychology

## 2014-04-02 NOTE — Progress Notes (Signed)
   Subjective:    Patient ID: Kimberly [name], female    DOB: Jun 24, 1956, 58 y.o.   MRN: [id number] CC: f/u HTN, BP was high at [facility]  HPI 58 yo F presents for f/u visit to discuss the following:  1. HTN: high at [employer] to 150-180/90s. Feeling lightheaded. No vision changes, CP, SOB. Taking HCTZ daily. Not compliant with low salt diet.   2. DM2: compliant with medication regimen. Checking CBGs, fasting 70-98. Post prandial < 200.  Has not yet seen ophthalmologist, bc [place] was too far.   3. Driving anxiety: since 2007. Avoids highways and busy/large streets.  4. HM: due for screening colonoscopy   Soc hx: chronic non smoker  Review of Systems As per HPI     Objective:   Physical Exam BP 124/75  Pulse 93  Temp(Src) 98.1 F (36.7 C) (Oral)  Resp 18  Ht 6' (1.829 m)  Wt 195 lb (88.451 kg)  BMI 26.44 kg/m2  SpO2 97% BP Readings from Last 3 Encounters:  04/02/14 124/75  02/24/14 128/82  11/28/13 144/91  General appearance: alert, cooperative and no distress Lungs: clear to auscultation bilaterally Heart: regular rate and rhythm, S1, S2 normal, no murmur, click, rub or gallop Extremities: extremities normal, atraumatic, no cyanosis or edema   Lab Results  Component Value Date   HGBA1C 11.2 02/24/2014       Assessment & Plan:

## 2014-04-09 ENCOUNTER — Telehealth: Payer: Self-pay | Admitting: Family Medicine

## 2014-04-09 ENCOUNTER — Other Ambulatory Visit: Payer: Self-pay | Admitting: Internal Medicine

## 2014-04-09 DIAGNOSIS — R053 Chronic cough: Secondary | ICD-10-CM

## 2014-04-09 DIAGNOSIS — R05 Cough: Secondary | ICD-10-CM

## 2014-04-09 NOTE — Telephone Encounter (Signed)
Pt. States that she is still coughing and would like to see the doctor or just get something to treat cough. Please f/u with pt.

## 2014-04-09 NOTE — Telephone Encounter (Signed)
States that she is still coughing and would like to see the doctor or just get something to treat cough. Please f/u with pt.

## 2014-04-15 DIAGNOSIS — K219 Gastro-esophageal reflux disease without esophagitis: Secondary | ICD-10-CM | POA: Insufficient documentation

## 2014-04-15 MED ORDER — BENZONATATE 200 MG PO CAPS: 200.0000 mg | ORAL_CAPSULE | Freq: Three times a day (TID) | ORAL | Status: DC | PRN

## 2014-04-15 NOTE — Assessment & Plan Note (Signed)
Tessalon perles CXR if cough becomes chronic

## 2014-04-15 NOTE — Telephone Encounter (Signed)
Tessalon perles sent to onsite pharmacy

## 2014-04-16 ENCOUNTER — Ambulatory Visit: Payer: Self-pay | Attending: Family Medicine

## 2014-04-16 NOTE — Progress Notes (Unsigned)
Pt here for BP check Medication taken Metoprolol ER 25mg  and   HCTC 25MG  Stated not taking Metoprolol x 1wk   BP 130/76 P 99 Pt advised to continue taking medication as PCP prescribe.

## 2014-04-29 ENCOUNTER — Telehealth: Payer: Self-pay | Admitting: Family Medicine

## 2014-04-29 NOTE — Telephone Encounter (Signed)
Pt. Called stating that she still has a cough and would like to see the doctor. Please f/u with pt.

## 2014-04-30 ENCOUNTER — Ambulatory Visit: Payer: Self-pay | Attending: Family Medicine | Admitting: Family Medicine

## 2014-04-30 ENCOUNTER — Encounter: Payer: Self-pay | Admitting: Family Medicine

## 2014-04-30 VITALS — BP 121/76 | HR 77 | Temp 98.3°F | Resp 18 | Ht 72.0 in | Wt 199.0 lb

## 2014-04-30 DIAGNOSIS — Z299 Encounter for prophylactic measures, unspecified: Secondary | ICD-10-CM

## 2014-04-30 DIAGNOSIS — E119 Type 2 diabetes mellitus without complications: Secondary | ICD-10-CM | POA: Insufficient documentation

## 2014-04-30 DIAGNOSIS — R05 Cough: Secondary | ICD-10-CM | POA: Insufficient documentation

## 2014-04-30 DIAGNOSIS — I10 Essential (primary) hypertension: Secondary | ICD-10-CM | POA: Insufficient documentation

## 2014-04-30 DIAGNOSIS — Z23 Encounter for immunization: Secondary | ICD-10-CM

## 2014-04-30 DIAGNOSIS — R059 Cough, unspecified: Secondary | ICD-10-CM

## 2014-04-30 DIAGNOSIS — Z7982 Long term (current) use of aspirin: Secondary | ICD-10-CM | POA: Insufficient documentation

## 2014-04-30 DIAGNOSIS — Z418 Encounter for other procedures for purposes other than remedying health state: Secondary | ICD-10-CM

## 2014-04-30 LAB — GLUCOSE, POCT (MANUAL RESULT ENTRY): POC Glucose: 167 mg/dl — AB (ref 70–99)

## 2014-04-30 LAB — POCT GLYCOSYLATED HEMOGLOBIN (HGB A1C): HEMOGLOBIN A1C: 9.5

## 2014-04-30 MED ORDER — INSULIN GLARGINE 300 UNIT/ML ~~LOC~~ SOPN: 60.0000 [IU] | PEN_INJECTOR | Freq: Every day | SUBCUTANEOUS | Status: DC

## 2014-04-30 MED ORDER — LORATADINE 10 MG PO TABS
10.0000 mg | ORAL_TABLET | Freq: Every day | ORAL | Status: DC
Start: 2014-04-30 — End: 2014-06-30

## 2014-04-30 NOTE — Progress Notes (Signed)
   Subjective:    Patient ID: Kimberly [name], female    DOB: 03/31/1956, 58 y.o.   MRN: [id number] CC: DM2 f/u,  Dry cough   HPI  58 yo F presents for DM2 f/u:  1. CHRONIC DIABETES  Disease Monitoring  Blood Sugar Ranges: 60-365 averages   Polyuria: no   Visual problems: no   Medication Compliance: yes  Medication Side Effects  Hypoglycemia: no   Preventitive Health Care  Eye Exam: needed   Foot Exam: done   Diet pattern: regular meals   Exercise: rare   2. Dry cough: non productive cough now. No fever and chills. No chest pain. Tessalon perles are not helping.   Soc hx:  Chronic non smoker  Review of Systems  As per HPI     Objective:   Physical Exam BP 121/76 mmHg  Pulse 77  Temp(Src) 98.3 F (36.8 C) (Oral)  Resp 18  Ht 6' (1.829 m)  Wt 199 lb (90.266 kg)  BMI 26.98 kg/m2  SpO2 98% General appearance: alert, cooperative and no distress Nose: Nares normal. Septum midline. Mucosa normal. No drainage or sinus tenderness. Throat: lips, mucosa, and tongue normal; teeth and gums normal Neck: no adenopathy and thyroid not enlarged, symmetric, no tenderness/mass/nodules Lungs: clear to auscultation bilaterally Heart: regular rate and rhythm, S1, S2 normal, no murmur, click, rub or gallop  Lab Results  Component Value Date   HGBA1C 9.5 04/30/2014       Assessment & Plan:

## 2014-04-30 NOTE — Assessment & Plan Note (Addendum)
A: wide range of CBGs with elevated fasting blood sugars. Overall improved control with improving A1c.  P: Change from lantus 50 U nightly to toujeo 60 U nightly with titration instructions, up 2 units for every fasting blood sugar > 130  Continue metformin 1000 mg BID  Continue amaryl 5 mg BID  F/u in 4 weeks

## 2014-04-30 NOTE — Assessment & Plan Note (Signed)
A: BP at goal w.o BB. P: Stop BB Continue HCTZ 25 mg  Daily

## 2014-04-30 NOTE — Assessment & Plan Note (Signed)
A: dry cough no improvement with tessalon. P: Start claritin 10 mg daily for suspected allergy mediated cough.

## 2014-04-30 NOTE — Progress Notes (Signed)
F/U DM  Complaining dry Cough

## 2014-04-30 NOTE — Patient Instructions (Signed)
Kimberly [name],  Thank you for coming in to see me today.  1. Diabetes: Elevated fasting sugar today. Plan: Change from lantus 50 U nightly to toujeo 60 U nightly with plan to up 2 units for every fasting blood sugar > 130. So take 60 tonight if sugar tomorrow AM is higher than 130 take 62 tomorrow night.  Write down your fasting blood sugars and nightly insulin dose.  Continue metformin 1000 mg BID  Continue amaryl 5 mg BID   2. Dry cough: stop tesslon. Start claritin 10 mg daily.   F/u in 4 weeks   Dr. Adrian Blackwater

## 2014-05-01 ENCOUNTER — Telehealth: Payer: Self-pay | Admitting: Family Medicine

## 2014-05-01 NOTE — Telephone Encounter (Signed)
Called patient left VM A1c going down! Well done Still recommend switching to toujeo.

## 2014-05-05 ENCOUNTER — Ambulatory Visit: Payer: Self-pay | Admitting: Family Medicine

## 2014-05-12 ENCOUNTER — Other Ambulatory Visit: Payer: Self-pay | Admitting: Internal Medicine

## 2014-05-12 ENCOUNTER — Other Ambulatory Visit: Payer: Self-pay | Admitting: *Deleted

## 2014-05-12 MED ORDER — GLYBURIDE 5 MG PO TABS: 5.0000 mg | ORAL_TABLET | Freq: Two times a day (BID) | ORAL | Status: DC

## 2014-06-30 ENCOUNTER — Ambulatory Visit: Payer: Self-pay | Attending: Family Medicine | Admitting: Family Medicine

## 2014-06-30 ENCOUNTER — Encounter: Payer: Self-pay | Admitting: Family Medicine

## 2014-06-30 VITALS — BP 150/85 | HR 83 | Temp 97.6°F | Resp 16 | Ht 72.0 in | Wt 197.0 lb

## 2014-06-30 DIAGNOSIS — Z7951 Long term (current) use of inhaled steroids: Secondary | ICD-10-CM | POA: Insufficient documentation

## 2014-06-30 DIAGNOSIS — E119 Type 2 diabetes mellitus without complications: Secondary | ICD-10-CM | POA: Insufficient documentation

## 2014-06-30 DIAGNOSIS — Z794 Long term (current) use of insulin: Secondary | ICD-10-CM | POA: Insufficient documentation

## 2014-06-30 DIAGNOSIS — R05 Cough: Secondary | ICD-10-CM | POA: Insufficient documentation

## 2014-06-30 DIAGNOSIS — R059 Cough, unspecified: Secondary | ICD-10-CM

## 2014-06-30 DIAGNOSIS — I10 Essential (primary) hypertension: Secondary | ICD-10-CM | POA: Insufficient documentation

## 2014-06-30 DIAGNOSIS — Z7982 Long term (current) use of aspirin: Secondary | ICD-10-CM | POA: Insufficient documentation

## 2014-06-30 LAB — GLUCOSE, POCT (MANUAL RESULT ENTRY): POC Glucose: 205 mg/dl — AB (ref 70–99)

## 2014-06-30 MED ORDER — LOSARTAN POTASSIUM 50 MG PO TABS: 50.0000 mg | ORAL_TABLET | Freq: Every day | ORAL | Status: DC

## 2014-06-30 MED ORDER — GLYBURIDE 5 MG PO TABS: 5.0000 mg | ORAL_TABLET | Freq: Two times a day (BID) | ORAL | Status: DC

## 2014-06-30 MED ORDER — OMEPRAZOLE 40 MG PO CPDR: 40.0000 mg | DELAYED_RELEASE_CAPSULE | Freq: Every day | ORAL | Status: DC

## 2014-06-30 MED ORDER — METFORMIN HCL 1000 MG PO TABS: ORAL_TABLET | ORAL | Status: DC

## 2014-06-30 MED ORDER — HYDROCHLOROTHIAZIDE 25 MG PO TABS: 25.0000 mg | ORAL_TABLET | Freq: Every day | ORAL | Status: DC

## 2014-06-30 NOTE — Progress Notes (Signed)
   Subjective:    Patient ID: Kimberly [name], female    DOB: 12-Jun-1956, 59 y.o.   MRN: [id number] CC: f/u HTN and DM2  HPI 59 yo F presents for f/u visit:  1. CHRONIC DIABETES  Disease Monitoring  Polyuria: no   Visual problems: no   Medication Compliance: yes  Medication Side Effects  Hypoglycemia: no   Preventitive Health Care  Eye Exam:done   Foot Exam: done   Diet pattern: regular   Exercise: light   2. CHRONIC HYPERTENSION  Disease Monitoring  Blood pressure range: does not check   Chest pain: no   Dyspnea: no   Claudication: no   Medication compliance: yes  Medication Side Effects  Lightheadedness: no   Urinary frequency: no   Edema: no     Preventitive Healthcare:  Salt Restriction: yes    Soc Hx:  Non smoker  Review of Systems As per HPI  R bicep pain  Persistent cough, no improvement with claritin. No fever, chills or weight loss.     Objective:   Physical Exam BP 149/83 mmHg  Pulse 84  Temp(Src) 97.6 F (36.4 C) (Oral)  Resp 16  Ht 6' (1.829 m)  Wt 197 lb (89.359 kg)  BMI 26.71 kg/m2  SpO2 98%  BP Readings from Last 3 Encounters:  06/30/14 149/83  04/30/14 121/76  04/16/14 130/76  General appearance: alert, cooperative and no distress Lungs: clear to auscultation bilaterally Heart: regular rate and rhythm, S1, S2 normal, no murmur, click, rub or gallop Extremities: extremities normal, atraumatic, no cyanosis or edema  CBG 205  Lab Results  Component Value Date   HGBA1C 9.5 04/30/2014        Assessment & Plan:

## 2014-06-30 NOTE — Progress Notes (Signed)
Pt is here following up on her HTN, Diabetes,and her hyperlipidemia. Pt states that she has had a prolonged cough. Pt reports since Friday she is having extreme pain in her right arm.

## 2014-06-30 NOTE — Patient Instructions (Signed)
Mrs. Lanford,  Thank you for coming in to see me today. Happy new year!  1. Diabetes: continue current medication. Low carb diet Exercise.  2. HTN: Continue HCTZ 25 mg daily  Goal BP is < 140/90  If needed, I will add cozaar to help get you to goal.   3.cough for 3 months I suspect post viral cough, allergy, GERD.  Allergy ruled out since Claritin did not help. Now treating possible GERD with Protonix  F/u in 4-6 weeks for DM2 (due for A1c), cough  Dr. Adrian Blackwater

## 2014-07-01 NOTE — Assessment & Plan Note (Signed)
Diabetes: continue current medication. Low carb diet Exercise.

## 2014-07-01 NOTE — Assessment & Plan Note (Signed)
cough for 3 months I suspect post viral cough, allergy, GERD.  Allergy ruled out since Claritin did not help. Now treating possible GERD with Protonix

## 2014-07-01 NOTE — Assessment & Plan Note (Signed)
HTN: Continue HCTZ 25 mg daily  Goal BP is < 140/90  If needed, I will add cozaar to help get you to goal.

## 2014-07-09 ENCOUNTER — Other Ambulatory Visit: Payer: Self-pay | Admitting: Family Medicine

## 2014-07-22 ENCOUNTER — Other Ambulatory Visit: Payer: Self-pay | Admitting: Internal Medicine

## 2014-07-24 ENCOUNTER — Other Ambulatory Visit: Payer: Self-pay | Admitting: *Deleted

## 2014-07-24 DIAGNOSIS — E785 Hyperlipidemia, unspecified: Secondary | ICD-10-CM

## 2014-07-24 MED ORDER — SIMVASTATIN 20 MG PO TABS
20.0000 mg | ORAL_TABLET | Freq: Every day | ORAL | Status: DC
Start: 2014-07-24 — End: 2015-02-20

## 2014-08-21 ENCOUNTER — Ambulatory Visit: Payer: Self-pay | Attending: Family Medicine | Admitting: Family Medicine

## 2014-08-21 ENCOUNTER — Encounter: Payer: Self-pay | Admitting: Family Medicine

## 2014-08-21 VITALS — BP 130/77 | HR 85 | Temp 97.8°F | Resp 16 | Ht 71.0 in | Wt 199.0 lb

## 2014-08-21 DIAGNOSIS — E119 Type 2 diabetes mellitus without complications: Secondary | ICD-10-CM | POA: Insufficient documentation

## 2014-08-21 DIAGNOSIS — E1165 Type 2 diabetes mellitus with hyperglycemia: Secondary | ICD-10-CM

## 2014-08-21 DIAGNOSIS — K219 Gastro-esophageal reflux disease without esophagitis: Secondary | ICD-10-CM | POA: Insufficient documentation

## 2014-08-21 DIAGNOSIS — L84 Corns and callosities: Secondary | ICD-10-CM | POA: Insufficient documentation

## 2014-08-21 DIAGNOSIS — I10 Essential (primary) hypertension: Secondary | ICD-10-CM

## 2014-08-21 LAB — GLUCOSE, POCT (MANUAL RESULT ENTRY): POC Glucose: 258 mg/dl — AB (ref 70–99)

## 2014-08-21 LAB — POCT GLYCOSYLATED HEMOGLOBIN (HGB A1C): Hemoglobin A1C: 11.4

## 2014-08-21 MED ORDER — OMEPRAZOLE 40 MG PO CPDR: 40.0000 mg | DELAYED_RELEASE_CAPSULE | Freq: Every day | ORAL | Status: DC

## 2014-08-21 MED ORDER — INSULIN GLARGINE 100 UNIT/ML ~~LOC~~ SOLN: 65.0000 [IU] | Freq: Every day | SUBCUTANEOUS | Status: DC

## 2014-08-21 MED ORDER — LOSARTAN POTASSIUM 50 MG PO TABS: 50.0000 mg | ORAL_TABLET | Freq: Every day | ORAL | Status: DC

## 2014-08-21 MED ORDER — GLYBURIDE 5 MG PO TABS
10.0000 mg | ORAL_TABLET | Freq: Two times a day (BID) | ORAL | Status: DC
Start: 2014-08-21 — End: 2014-10-17

## 2014-08-21 NOTE — Patient Instructions (Addendum)
Kimberly [name],  Thank you for coming in today. Very nice to see you again  1. Diabetes: A1c on the rise 11  Goal is < 8 Plan Increase lantus to 65 U daily  Increase glyburide to 10 mg BID  Increase exercise  Low carb and low fat diet   2. Concerns about housing:  I spoke to the clinic coordinator  [facility]   F/u in 3 weeks with nurse to review blood sugars  F/u in 3 months with me   Dr. Adrian Blackwater

## 2014-08-21 NOTE — Assessment & Plan Note (Signed)
A; chronic cough improved with prilosec, consistent with GERD P: refilled prilosec

## 2014-08-21 NOTE — Progress Notes (Signed)
   Subjective:    Patient ID: Kimberly [name], female    DOB: 22-Jun-1956, 59 y.o.   MRN: [id number] CC; f/u DM2 HPI 59 yo F:  1. CHRONIC DIABETES  Disease Monitoring  Blood Sugar Ranges: not checkin   Polyuria: no   Visual problems: no   Medication Compliance: yes  Medication Side Effects  Hypoglycemia: no   Preventitive Health Care  Eye Exam: due   Foot Exam: due   Diet pattern: eating fried foods  Exercise: minimal   2. Painful foot calluses: x many months. No skin lesions or ulcerations.   Soc Hx: chronic non smoker    Review of Systems As per HPI  GAD-7: 1-4.     Objective:   Physical Exam BP 130/77 mmHg  Pulse 85  Temp(Src) 97.8 F (36.6 C)  Resp 16  Ht 5\' 11"  (1.803 m)  Wt 199 lb (90.266 kg)  BMI 27.77 kg/m2  SpO2 97% General appearance: alert, cooperative and no distress Lungs: normal WOB Extremities: extremities normal, atraumatic, no cyanosis or edema , callus on plantar foot   Lab Results  Component Value Date   HGBA1C 11.40 08/21/2014       Assessment & Plan:

## 2014-08-21 NOTE — Assessment & Plan Note (Signed)
A: calluses both feet P:  Referral to podiatry

## 2014-08-21 NOTE — Progress Notes (Signed)
Patient here to follow up with DM2 Patient reports cough is better since starting the antacid Patient asking if she will need to take this medication for the rest of her life Patient c/o calluses on bottom of both feet

## 2014-08-21 NOTE — Assessment & Plan Note (Signed)
1. Diabetes: declined, A1c elevated  A1c on the rise 11  Goal is < 8 Plan Increase lantus to 65 U daily  Increase glyburide to 10 mg BID  Increase exercise  Low carb and low fat diet

## 2014-08-22 ENCOUNTER — Telehealth: Payer: Self-pay | Admitting: *Deleted

## 2014-08-22 LAB — MICROALBUMIN / CREATININE URINE RATIO
CREATININE, URINE: 107.7 mg/dL
MICROALB UR: 5.5 mg/dL — AB (ref <2.0)
Microalb Creat Ratio: 51.1 mg/g — ABNORMAL HIGH (ref 0.0–30.0)

## 2014-08-22 NOTE — Telephone Encounter (Signed)
Left voice message to return call 

## 2014-08-22 NOTE — Telephone Encounter (Signed)
Pt aware of results 

## 2014-08-22 NOTE — Telephone Encounter (Signed)
-----   Message from Minerva Ends, MD sent at 08/22/2014  9:33 AM EST ----- Elevated urine micral Continue losartan

## 2014-09-12 ENCOUNTER — Ambulatory Visit: Payer: Self-pay | Attending: Family Medicine

## 2014-09-24 ENCOUNTER — Other Ambulatory Visit: Payer: Self-pay | Admitting: Family Medicine

## 2014-10-06 ENCOUNTER — Telehealth: Payer: Self-pay | Admitting: General Practice

## 2014-10-06 NOTE — Telephone Encounter (Signed)
Thank you  Girl

## 2014-10-06 NOTE — Telephone Encounter (Signed)
Hi Alinda Sierras,  The paperwork that you mailed the patient was returned to clinic today, 10/06/14. Per Post office: "insufficent address, unable to forward"..  The address that you sent it to is the same as the address listed in EPIC. I tried contacting the patient, no answer. I will place this paperwork in your box.

## 2014-10-17 ENCOUNTER — Ambulatory Visit: Payer: Self-pay | Attending: Family Medicine | Admitting: Family Medicine

## 2014-10-17 ENCOUNTER — Encounter: Payer: Self-pay | Admitting: Family Medicine

## 2014-10-17 VITALS — BP 150/75 | HR 98 | Temp 98.0°F | Resp 16 | Ht 72.0 in | Wt 195.0 lb

## 2014-10-17 DIAGNOSIS — Z794 Long term (current) use of insulin: Secondary | ICD-10-CM | POA: Insufficient documentation

## 2014-10-17 DIAGNOSIS — E119 Type 2 diabetes mellitus without complications: Secondary | ICD-10-CM | POA: Insufficient documentation

## 2014-10-17 DIAGNOSIS — K219 Gastro-esophageal reflux disease without esophagitis: Secondary | ICD-10-CM

## 2014-10-17 DIAGNOSIS — H269 Unspecified cataract: Secondary | ICD-10-CM

## 2014-10-17 DIAGNOSIS — I10 Essential (primary) hypertension: Secondary | ICD-10-CM | POA: Insufficient documentation

## 2014-10-17 LAB — GLUCOSE, POCT (MANUAL RESULT ENTRY): POC Glucose: 251 mg/dl — AB (ref 70–99)

## 2014-10-17 MED ORDER — OMEPRAZOLE 40 MG PO CPDR: 40.0000 mg | DELAYED_RELEASE_CAPSULE | Freq: Every day | ORAL | Status: DC

## 2014-10-17 MED ORDER — DAPAGLIFLOZIN PROPANEDIOL 5 MG PO TABS: 5.0000 mg | ORAL_TABLET | Freq: Every day | ORAL | Status: DC

## 2014-10-17 NOTE — Assessment & Plan Note (Signed)
A:  Diabetes: above goal P: Diabetes blood sugar goals  Fasting (in AM before breakfast, 8 hrs of no eating or drinking (except water or unsweetened coffee or tea): 90-110 2 hrs after meals and snack: < 160,  2 hrs after meals  No low sugars: nothing < 70   Start Farxiga 5 mg daily drink a lot of water Stop glyburide

## 2014-10-17 NOTE — Assessment & Plan Note (Signed)
HTN: at goal  Goal < 150/90 Continue HCTZ and losartan farxiga will help lower your blood pressure

## 2014-10-17 NOTE — Patient Instructions (Signed)
Kimberly [name],  1. Diabetes: Diabetes blood sugar goals  Fasting (in AM before breakfast, 8 hrs of no eating or drinking (except water or unsweetened coffee or tea): 90-110 2 hrs after meals and snack: < 160,  2 hrs after meals  No low sugars: nothing < 70   Start Farxiga 5 mg daily drink a lot of water Stop glyburide  2. HTN: Goal < 150/90 Continue HCTZ and losartan farxiga will help lower your blood pressure  F/u in 2 months with me for diabetes follow up  Dr. Adrian Blackwater

## 2014-10-17 NOTE — Progress Notes (Signed)
F/U DM

## 2014-10-17 NOTE — Assessment & Plan Note (Signed)
A; followed by Dr. Arlyn Dunning on Milus Glazier P: plan for cataract surgery

## 2014-10-17 NOTE — Progress Notes (Signed)
   Subjective:    Patient ID: Kimberly [name], female    DOB: Aug 22, 1955, 59 y.o.   MRN: [id number] CC: DM f/u  HPI  1. CHRONIC DIABETES  Disease Monitoring  Blood Sugar Ranges: 200-250 high fastings   Polyuria: no   Visual problems: yes, planning to have cataract surgery    Medication Compliance: yes  Medication Side Effects  Hypoglycemia: no   Preventitive Health Care  Eye Exam: done has cataracts   Foot Exam: done today   Diet pattern: regular meals, high carb snacks   Exercise: yes    2. CHRONIC HYPERTENSION  Disease Monitoring  Blood pressure range: not checking   Chest pain: no   Dyspnea: no   Claudication: no   Medication compliance: yes  Medication Side Effects  Lightheadedness: no   Urinary frequency: yes   Edema: no    Soc Hx: non smoker  Review of Systems  Respiratory: Negative for shortness of breath.   Cardiovascular: Negative for chest pain and leg swelling.       Objective:   Physical Exam BP 150/75 mmHg  Pulse 98  Temp(Src) 98 F (36.7 C) (Oral)  Resp 16  Ht 6' (1.829 m)  Wt 195 lb (88.451 kg)  BMI 26.44 kg/m2  SpO2 98% General appearance: alert, cooperative and no distress Lungs: clear to auscultation bilaterally Extremities: extremities normal, atraumatic, no cyanosis or edema  Callus on bottom of feet    Lab Results  Component Value Date   HGBA1C 11.40 08/21/2014   CBG     Assessment & Plan:

## 2014-11-17 ENCOUNTER — Other Ambulatory Visit: Payer: Self-pay | Admitting: Family Medicine

## 2014-11-18 ENCOUNTER — Other Ambulatory Visit: Payer: Self-pay | Admitting: Family Medicine

## 2014-11-20 ENCOUNTER — Other Ambulatory Visit: Payer: Self-pay | Admitting: Family Medicine

## 2014-11-25 ENCOUNTER — Other Ambulatory Visit: Payer: Self-pay | Admitting: Family Medicine

## 2014-11-25 DIAGNOSIS — E119 Type 2 diabetes mellitus without complications: Secondary | ICD-10-CM

## 2014-11-25 MED ORDER — TRUEPLUS LANCETS 28G MISC: 1.0000 | Freq: Three times a day (TID) | Status: DC

## 2014-12-12 ENCOUNTER — Other Ambulatory Visit: Payer: Self-pay | Admitting: Family Medicine

## 2014-12-15 ENCOUNTER — Telehealth: Payer: Self-pay | Admitting: Family Medicine

## 2014-12-15 DIAGNOSIS — I10 Essential (primary) hypertension: Secondary | ICD-10-CM

## 2014-12-15 NOTE — Telephone Encounter (Signed)
Kimberly [name] from [facility] called requesting medication refill on hydrochlorothiazide (HYDRODIURIL) 25 MG tablet . Patient was given an emergency refill .Please f/u with patient

## 2014-12-16 MED ORDER — HYDROCHLOROTHIAZIDE 25 MG PO TABS: 25.0000 mg | ORAL_TABLET | Freq: Every day | ORAL | Status: DC

## 2014-12-16 NOTE — Telephone Encounter (Signed)
Rx send

## 2014-12-19 ENCOUNTER — Other Ambulatory Visit: Payer: Self-pay | Admitting: Family Medicine

## 2014-12-23 ENCOUNTER — Other Ambulatory Visit: Payer: Self-pay | Admitting: Family Medicine

## 2014-12-24 ENCOUNTER — Other Ambulatory Visit: Payer: Self-pay | Admitting: Family Medicine

## 2015-01-06 ENCOUNTER — Ambulatory Visit: Payer: Self-pay | Attending: Family Medicine | Admitting: Family Medicine

## 2015-01-06 ENCOUNTER — Encounter: Payer: Self-pay | Admitting: Family Medicine

## 2015-01-06 VITALS — BP 128/81 | HR 92 | Temp 98.0°F | Resp 18 | Ht 71.0 in | Wt 197.0 lb

## 2015-01-06 DIAGNOSIS — K219 Gastro-esophageal reflux disease without esophagitis: Secondary | ICD-10-CM

## 2015-01-06 DIAGNOSIS — E119 Type 2 diabetes mellitus without complications: Secondary | ICD-10-CM

## 2015-01-06 DIAGNOSIS — H269 Unspecified cataract: Secondary | ICD-10-CM

## 2015-01-06 LAB — GLUCOSE, POCT (MANUAL RESULT ENTRY): POC Glucose: 142 mg/dl — AB (ref 70–99)

## 2015-01-06 LAB — POCT GLYCOSYLATED HEMOGLOBIN (HGB A1C): HEMOGLOBIN A1C: 10.3

## 2015-01-06 MED ORDER — OMEPRAZOLE 20 MG PO CPDR: 20.0000 mg | DELAYED_RELEASE_CAPSULE | Freq: Every day | ORAL | Status: DC

## 2015-01-06 MED ORDER — GLYBURIDE 2.5 MG PO TABS: 2.5000 mg | ORAL_TABLET | Freq: Two times a day (BID) | ORAL | Status: DC

## 2015-01-06 NOTE — Progress Notes (Signed)
   Subjective:    Patient ID: Kimberly [name], female    DOB: [birth year], 59 y.o.   MRN: [id number] CC: DM2 f/u  HPI  1. CHRONIC DIABETES  Disease Monitoring  Blood Sugar Ranges: 140s  Polyuria: no   Visual problems: yes has b/l cataract L > R  Medication Compliance: yes, except for farxiga   Medication Side Effects  Hypoglycemia: no   Preventitive Health Care  Eye Exam: done has cataracts needs cataract surgery. Is awaiting determination of payment from the [employer].    2. Cataracts: L eye worse than R. Has trouble seeing. Scheduled for surgery. Waiting to see if the [employer] will cover the cost of surgery.   3. GERD: improved. No cough. Taking prilosec 40 mg daily. Having burps. Has intermittent CP. No pressure or diaphoresis.   Soc Hx: chronic non smoker Review of Systems  Constitutional: Negative for fever and chills.  Eyes: Positive for visual disturbance.  Respiratory: Negative for shortness of breath.   Cardiovascular: Negative for chest pain.  Gastrointestinal: Negative for abdominal pain and blood in stool.  Endocrine: Negative for polydipsia, polyphagia and polyuria.  Skin: Negative for rash.  Psychiatric/Behavioral: Negative for suicidal ideas and dysphoric mood.       Objective:   Physical Exam BP 128/81 mmHg  Pulse 92  Temp(Src) 98 F (36.7 C) (Oral)  Resp 18  Ht 5\' 11"  (1.803 m)  Wt 197 lb (89.359 kg)  BMI 27.49 kg/m2  SpO2 96% General appearance: alert, cooperative and no distress Lungs: clear to auscultation bilaterally Heart: regular rate and rhythm, S1, S2 normal, no murmur, click, rub or gallop Extremities: extremities normal, atraumatic, no cyanosis or edema  Lab Results  Component Value Date   HGBA1C 10.30 01/06/2015   CBG 142      Assessment & Plan:

## 2015-01-06 NOTE — Progress Notes (Signed)
F/U DM

## 2015-01-06 NOTE — Progress Notes (Signed)
ASSESSMENT: Pt currently experiencing psychosocial problems. She would benefit from community resources. Stage of Change: contemplative  PLAN: 1. F/U with behavioral health consultant in as needed 2. Psychiatric Medications: none. 3. Behavioral recommendation(s):   -Consider [facility] for help w rent/utilities -Consider [employer] for support group- women in transition SUBJECTIVE: Pt. referred by Dr Adrian Blackwater for psychosocial issues:  Pt. here for referral regarding psychosocial issues.  Pt. reports the following symptoms/concerns: Pt says she is out of work for the next two months, and the unemployment she will receive is not going to be enough to pay her rent; she has already maxed out rental/utility assistance at [employer] and [employer], and says that [employer] was not helpful to her.  Duration of problem: about a month Severity: mild  OBJECTIVE: Orientation & Cognition: Oriented x3. Thought processes normal and appropriate to situation. Mood: appropriate. Affect: appropriate Appearance: appropriate Risk of harm to self or others: no risk of harm to self or others Substance use: none Psychiatric medication use: Unchanged from prior contact. Assessments administered: none  Diagnosis: Problem related to psychosocial circumstances CPT Code: Z65.9 -------------------------------------------- Other(s) present in the room: none  Time spent with patient in exam room: 10 minutes

## 2015-01-06 NOTE — Patient Instructions (Signed)
Kindall,  Thank you for coming in today  1. Diabetes: Improving Continue levemir Diabeta 2.5 mg twice daily Metformin 1000 mg twice daily  Exercise    2. Gerd:  Improved with no longer having cough Decrease prilosec to 20 mg once daily for next 3 weeks then decrease to as needed for cough or heartburn   Call with update on cataract surgery  F/u in 3 months for diabetes  Dr. Adrian Blackwater

## 2015-01-07 ENCOUNTER — Ambulatory Visit: Payer: Self-pay | Attending: Internal Medicine

## 2015-01-07 ENCOUNTER — Telehealth: Payer: Self-pay | Admitting: General Practice

## 2015-01-07 DIAGNOSIS — Z0000 Encounter for general adult medical examination without abnormal findings: Secondary | ICD-10-CM

## 2015-01-07 NOTE — Assessment & Plan Note (Signed)
Diabetes: Improving Continue levemir Diabeta 2.5 mg twice daily Metformin 1000 mg twice daily  Exercise

## 2015-01-07 NOTE — Telephone Encounter (Signed)
Mammogram ordered please inform patient

## 2015-01-07 NOTE — Telephone Encounter (Signed)
Patient was seen in clinic yesterday 01/06/15... Patient states she failed to mention to PCP that she would like mammogram referral. Please assist

## 2015-01-07 NOTE — Telephone Encounter (Signed)
Pt aware  [facility] phone number given to pt

## 2015-01-07 NOTE — Assessment & Plan Note (Signed)
Gerd:  Improved with no longer having cough Decrease prilosec to 20 mg once daily for next 3 weeks then decrease to as needed for cough or heartburn

## 2015-01-07 NOTE — Telephone Encounter (Signed)
LVM to return call.

## 2015-01-07 NOTE — Assessment & Plan Note (Signed)
Call with update on cataract surgery if [employer] unable to assist with cost, will pursue referral through [facility] or [facility]

## 2015-01-15 ENCOUNTER — Telehealth: Payer: Self-pay | Admitting: Family Medicine

## 2015-01-15 DIAGNOSIS — E119 Type 2 diabetes mellitus without complications: Secondary | ICD-10-CM

## 2015-01-15 NOTE — Telephone Encounter (Signed)
Pt's upcoming eye surgery has been scheduled and she's calling to rely information about surgery as well as to ask advice. Please f/u with pt.

## 2015-01-16 MED ORDER — GLYBURIDE 2.5 MG PO TABS: 5.0000 mg | ORAL_TABLET | Freq: Two times a day (BID) | ORAL | Status: DC

## 2015-01-16 NOTE — Telephone Encounter (Signed)
Mediation resend, pt aware

## 2015-01-23 ENCOUNTER — Telehealth: Payer: Self-pay | Admitting: Family Medicine

## 2015-01-23 DIAGNOSIS — E119 Type 2 diabetes mellitus without complications: Secondary | ICD-10-CM

## 2015-01-23 NOTE — Telephone Encounter (Signed)
Pt was referred to get a mammogram but was never called to be scheduled. She has the orange card and the [employer] discount. [facility] Please follow up with pt. Thank you.

## 2015-01-23 NOTE — Telephone Encounter (Signed)
Pt is calling because she is confused about her dosage and intake for her glyBURIDE (DIABETA) 2.5 MG tablet. She said that in the past she was diagnosed 5mg  once a day but now is prescribed 2.5mg  twice a day. She mentioned that she would like to keep her dosage to 5mg  once a day to avoid confusion in the future and be consistent. She is also concerned because her prescription was not sent to her pharamacy and was sent to her old pharmacy and that this has been an ongoing issue. The pharmacy that she uses is [facility] [place], [place] and their phone is [phone]. Please follow up with pt. Thank you.

## 2015-01-23 NOTE — Telephone Encounter (Signed)
I don't get those referral  Nurse schedule those appointments.

## 2015-01-27 MED ORDER — GLYBURIDE 5 MG PO TABS: 5.0000 mg | ORAL_TABLET | Freq: Two times a day (BID) | ORAL | Status: DC

## 2015-01-27 NOTE — Telephone Encounter (Signed)
Rx send to [facility]  Pt aware

## 2015-01-30 ENCOUNTER — Other Ambulatory Visit: Payer: Self-pay | Admitting: Family Medicine

## 2015-01-30 ENCOUNTER — Ambulatory Visit (HOSPITAL_COMMUNITY)
Admission: RE | Admit: 2015-01-30 | Discharge: 2015-01-30 | Disposition: A | Discharge status: Home / Self Care | Payer: No Typology Code available for payment source | Source: Ambulatory Visit | Attending: Family Medicine | Admitting: Family Medicine

## 2015-01-30 DIAGNOSIS — Z0000 Encounter for general adult medical examination without abnormal findings: Secondary | ICD-10-CM

## 2015-01-30 DIAGNOSIS — Z1231 Encounter for screening mammogram for malignant neoplasm of breast: Secondary | ICD-10-CM | POA: Insufficient documentation

## 2015-02-14 ENCOUNTER — Other Ambulatory Visit: Payer: Self-pay | Admitting: Family Medicine

## 2015-02-20 ENCOUNTER — Other Ambulatory Visit: Payer: Self-pay | Admitting: *Deleted

## 2015-02-20 DIAGNOSIS — E785 Hyperlipidemia, unspecified: Secondary | ICD-10-CM

## 2015-02-20 MED ORDER — SIMVASTATIN 20 MG PO TABS: 20.0000 mg | ORAL_TABLET | Freq: Every day | ORAL | Status: DC

## 2015-03-28 ENCOUNTER — Other Ambulatory Visit: Payer: Self-pay | Admitting: Family Medicine

## 2015-04-28 ENCOUNTER — Other Ambulatory Visit: Payer: Self-pay | Admitting: Family Medicine

## 2015-05-15 ENCOUNTER — Telehealth: Payer: Self-pay

## 2015-05-15 NOTE — Telephone Encounter (Signed)
Nurse called patient, patient verified date of birth. Nurse received refill request for metformin. Patient explains she already has metformin prescription. Nurse explained to patient need for appointment to be seen by Dr. Adrian Blackwater for diabetes. Patient explains she also needs an appointment with Diane. Nurse transferred patient to front office staff to schedule both apportionments.

## 2015-07-11 ENCOUNTER — Other Ambulatory Visit: Payer: Self-pay | Admitting: Family Medicine

## 2015-08-12 ENCOUNTER — Other Ambulatory Visit: Payer: Self-pay | Admitting: Internal Medicine

## 2015-08-12 DIAGNOSIS — E049 Nontoxic goiter, unspecified: Secondary | ICD-10-CM

## 2015-08-17 ENCOUNTER — Ambulatory Visit (HOSPITAL_COMMUNITY): Payer: No Typology Code available for payment source

## 2015-08-19 ENCOUNTER — Ambulatory Visit (HOSPITAL_COMMUNITY)
Admission: RE | Admit: 2015-08-19 | Discharge: 2015-08-19 | Disposition: A | Discharge status: Home / Self Care | Payer: BLUE CROSS/BLUE SHIELD | Source: Ambulatory Visit | Attending: Internal Medicine | Admitting: Internal Medicine

## 2015-08-19 DIAGNOSIS — E049 Nontoxic goiter, unspecified: Secondary | ICD-10-CM | POA: Insufficient documentation

## 2015-11-02 ENCOUNTER — Emergency Department (HOSPITAL_COMMUNITY): Payer: BLUE CROSS/BLUE SHIELD

## 2015-11-02 ENCOUNTER — Encounter (HOSPITAL_COMMUNITY): Payer: Self-pay | Admitting: Emergency Medicine

## 2015-11-02 ENCOUNTER — Emergency Department (HOSPITAL_COMMUNITY)
Admission: EM | Admit: 2015-11-02 | Discharge: 2015-11-02 | Disposition: A | Discharge status: Home / Self Care | Payer: BLUE CROSS/BLUE SHIELD | Attending: Emergency Medicine | Admitting: Emergency Medicine

## 2015-11-02 DIAGNOSIS — Z7982 Long term (current) use of aspirin: Secondary | ICD-10-CM | POA: Diagnosis not present

## 2015-11-02 DIAGNOSIS — S00511A Abrasion of lip, initial encounter: Secondary | ICD-10-CM | POA: Insufficient documentation

## 2015-11-02 DIAGNOSIS — Y999 Unspecified external cause status: Secondary | ICD-10-CM | POA: Insufficient documentation

## 2015-11-02 DIAGNOSIS — S80211A Abrasion, right knee, initial encounter: Secondary | ICD-10-CM | POA: Insufficient documentation

## 2015-11-02 DIAGNOSIS — M25561 Pain in right knee: Secondary | ICD-10-CM | POA: Diagnosis present

## 2015-11-02 DIAGNOSIS — W010XXA Fall on same level from slipping, tripping and stumbling without subsequent striking against object, initial encounter: Secondary | ICD-10-CM | POA: Insufficient documentation

## 2015-11-02 DIAGNOSIS — Y9301 Activity, walking, marching and hiking: Secondary | ICD-10-CM | POA: Insufficient documentation

## 2015-11-02 DIAGNOSIS — Z794 Long term (current) use of insulin: Secondary | ICD-10-CM | POA: Insufficient documentation

## 2015-11-02 DIAGNOSIS — E119 Type 2 diabetes mellitus without complications: Secondary | ICD-10-CM | POA: Diagnosis not present

## 2015-11-02 DIAGNOSIS — W19XXXA Unspecified fall, initial encounter: Secondary | ICD-10-CM

## 2015-11-02 DIAGNOSIS — Y929 Unspecified place or not applicable: Secondary | ICD-10-CM | POA: Insufficient documentation

## 2015-11-02 DIAGNOSIS — T07XXXA Unspecified multiple injuries, initial encounter: Secondary | ICD-10-CM

## 2015-11-02 DIAGNOSIS — S0081XA Abrasion of other part of head, initial encounter: Secondary | ICD-10-CM | POA: Diagnosis not present

## 2015-11-02 DIAGNOSIS — Z79899 Other long term (current) drug therapy: Secondary | ICD-10-CM | POA: Diagnosis not present

## 2015-11-02 DIAGNOSIS — E785 Hyperlipidemia, unspecified: Secondary | ICD-10-CM | POA: Insufficient documentation

## 2015-11-02 DIAGNOSIS — I10 Essential (primary) hypertension: Secondary | ICD-10-CM | POA: Insufficient documentation

## 2015-11-02 DIAGNOSIS — Z7984 Long term (current) use of oral hypoglycemic drugs: Secondary | ICD-10-CM | POA: Insufficient documentation

## 2015-11-02 MED ORDER — IBUPROFEN 800 MG PO TABS
800.0000 mg | ORAL_TABLET | Freq: Once | ORAL | Status: AC
  Administered 2015-11-02: 800 mg via ORAL
  Filled 2015-11-02: qty 1

## 2015-11-02 MED ORDER — ACETAMINOPHEN 325 MG PO TABS
650.0000 mg | ORAL_TABLET | Freq: Once | ORAL | Status: AC
  Administered 2015-11-02: 650 mg via ORAL
  Filled 2015-11-02: qty 2

## 2015-11-02 MED ORDER — AMOXICILLIN 500 MG PO CAPS: 500.0000 mg | ORAL_CAPSULE | Freq: Two times a day (BID) | ORAL | Status: DC

## 2015-11-02 MED ORDER — IBUPROFEN 800 MG PO TABS: 800.0000 mg | ORAL_TABLET | Freq: Three times a day (TID) | ORAL | Status: DC

## 2015-11-02 MED ORDER — BACITRACIN ZINC 500 UNIT/GM EX OINT
TOPICAL_OINTMENT | Freq: Two times a day (BID) | CUTANEOUS | Status: DC
  Administered 2015-11-02: 1 via TOPICAL

## 2015-11-02 MED ORDER — ACETAMINOPHEN 500 MG PO TABS: 500.0000 mg | ORAL_TABLET | Freq: Four times a day (QID) | ORAL | Status: DC | PRN

## 2015-11-02 NOTE — ED Provider Notes (Signed)
CSN: [id number]     Arrival date & time 11/02/15  1116 History  By signing my name below, I, Soijett Blue, attest that this documentation has been prepared under the direction and in the presence of Gloriann Loan, PA-C Electronically Signed: Soijett Blue, ED Scribe. 11/02/2015. 12:37 PM.   Chief Complaint  Patient presents with  . Facial Injury    laceration to lower lip  . Knee Pain    r/knee pain  . Fall    pt tripped an fell on face, denies LOC     The history is provided by the patient. No language interpreter was used.    Kimberly [name] is a 60 y.o. female with a medical hx of DM, HTN, who presents to the Emergency Department complaining of a fall onset 10:15 AM PTA. Pt notes that she tripped and fell onto her face while ambulating and landed on the concrete. Pt is having associated symptoms of right knee pain, lower lip laceration, abrasion to chin and right knee, and global HA. She notes that she has not tried any medications for the relief of her symptoms. She denies CP, SOB, LOC, nausea, vomiting, blurred vision, neck pain, gait problem, or any other symptoms. She is not anticoagulated.  Per pt chart review, pt tetanus is UTD and was last updated in 2015 during her PCP visit.  Past Medical History  Diagnosis Date  . Diabetes mellitus 2001  . Hypertension 2001  . Hyperlipidemia Dx 2012   Past Surgical History  Procedure Laterality Date  . Abdominal hysterectomy     Family History  Problem Relation Age of Onset  . Depression Mother   . Hypertension Mother   . Heart disease Father   . Cancer Sister   . Cancer Brother    Social History  Substance Use Topics  . Smoking status: Never Smoker   . Smokeless tobacco: None  . Alcohol Use: Yes     Comment: occ   OB History    No data available     Review of Systems  Eyes: Negative for visual disturbance.  Respiratory: Negative for shortness of breath.   Cardiovascular: Negative for chest pain.  Gastrointestinal: Negative  for nausea and vomiting.  Musculoskeletal: Positive for arthralgias. Negative for joint swelling and neck pain.  Skin: Positive for wound (abrasion to chin and right knee).  Neurological: Positive for headaches. Negative for syncope.  All other systems reviewed and are negative.     Allergies  Ace inhibitors  Home Medications   Prior to Admission medications   Medication Sig Start Date End Date Taking? Authorizing Provider  aspirin EC 81 MG tablet Take 1 tablet (81 mg total) by mouth daily. 11/16/12   Boykin Nearing, MD  Biotin 5000 MCG CAPS Take 1 capsule by mouth daily.    Historical Provider, MD  Blood Glucose Monitoring Suppl (BLOOD GLUCOSE METER) kit Use as instructed 07/27/13   Boykin Nearing, MD  budesonide-formoterol (SYMBICORT) 160-4.5 MCG/ACT inhaler Inhale 2 puffs into the lungs 2 (two) times daily. 09/20/13   Robbie Lis, MD  glyBURIDE (DIABETA) 5 MG tablet TAKE 1 TABLET (5 MG TOTAL) BY MOUTH 2 (TWO) TIMES DAILY WITH A MEAL. 04/30/15   Josalyn Funches, MD  hydrochlorothiazide (HYDRODIURIL) 25 MG tablet TAKE 1 TABLET (25 MG TOTAL) BY MOUTH DAILY. 01/30/15   Josalyn Funches, MD  insulin glargine (LANTUS) 100 UNIT/ML injection Inject 0.65 mLs (65 Units total) into the skin at bedtime. 08/21/14   Boykin Nearing, MD  losartan (  COZAAR) 50 MG tablet Take 1 tablet (50 mg total) by mouth daily. 08/21/14   Josalyn Funches, MD  metFORMIN (GLUCOPHAGE) 1000 MG tablet TAKE 1 TABLET (1,000 MG TOTAL) BY MOUTH 2 (TWO) TIMES DAILY WITH A MEAL. 06/30/14   Boykin Nearing, MD  omeprazole (PRILOSEC) 20 MG capsule Take 1 capsule (20 mg total) by mouth daily. 30 minutes before supper 01/06/15   Boykin Nearing, MD  simvastatin (ZOCOR) 20 MG tablet Take 1 tablet (20 mg total) by mouth at bedtime. 02/20/15   Boykin Nearing, MD  TRUEPLUS LANCETS 28G MISC 1 each by Does not apply route 3 (three) times daily. 11/25/14   Josalyn Funches, MD  TRUETEST TEST test strip USE AS DIRECTED BY MD 12/26/14   Boykin Nearing, MD   BP 154/83 mmHg  Pulse 83  Temp(Src) 99.4 F (37.4 C) (Oral)  Resp 18  Wt 88.451 kg  SpO2 96% Physical Exam  Constitutional: She is oriented to person, place, and time. She appears well-developed and well-nourished.  Non-toxic appearance. She does not have a sickly appearance. She does not appear ill.  HENT:  Head: Normocephalic.  Mouth/Throat: Oropharynx is clear and moist.  Superficial abrasion to chin. Interior lip injury involving mucosal surface, non-penetrating. No FB. No other signs of trauma.   Eyes: Conjunctivae are normal. Pupils are equal, round, and reactive to light.  Neck: Normal range of motion. Neck supple.  No cervical midline tenderness.   Cardiovascular: Normal rate, regular rhythm and normal heart sounds.   No murmur heard. Pulmonary/Chest: Effort normal and breath sounds normal. No accessory muscle usage or stridor. No respiratory distress. She has no wheezes. She has no rhonchi. She has no rales.  Abdominal: Soft. Bowel sounds are normal. She exhibits no distension. There is no tenderness.  Musculoskeletal: Normal range of motion.  No thoracic, lumbar, or sacral midline tenderness.  Right knee with superficial abrasion and mild swelling. Mild tenderness over abrasion. Full ROM in flexion and extension.   Lymphadenopathy:    She has no cervical adenopathy.  Neurological: She is alert and oriented to person, place, and time.  Speech clear without dysarthria. Strength and sensation intact to bilateral lower extremities.   Skin: Skin is warm and dry. Abrasion noted.  Psychiatric: She has a normal mood and affect. Her behavior is normal.  Nursing note and vitals reviewed.   ED Course  Procedures (including critical care time) DIAGNOSTIC STUDIES: Oxygen Saturation is 96% on RA, nl by my interpretation.    COORDINATION OF CARE: 12:36 PM Discussed treatment plan with pt at bedside which includes right knee xray, ibuprofen, tylenol, and pt agreed to  plan.    Labs Review Labs Reviewed - No data to display  Imaging Review Dg Knee Complete 4 Views Right  11/02/2015  CLINICAL DATA:  Status post fall onto pavement today with a right knee injury. Pain. Initial encounter. EXAM: RIGHT KNEE - COMPLETE 4+ VIEW COMPARISON:  None. FINDINGS: There is no evidence of fracture, dislocation, or joint effusion. Small medial patellofemoral osteophytes are noted. Soft tissues are unremarkable. IMPRESSION: No acute abnormality. Mild osteoarthritis. Electronically Signed   By: Inge Rise M.D.   On: 11/02/2015 13:14   I have personally reviewed and evaluated these images as part of my medical decision-making.   EKG Interpretation None      MDM   Final diagnoses:  Fall, initial encounter  Abrasions of multiple sites  Lip abrasion, initial encounter  Right knee pain    Patient X-Ray  negative for obvious fracture or dislocation.  No indication for CT head using [place] CT rules.  No cervical midline tenderness. Pt advised to follow up with orthopedics. Conservative therapy recommended and discussed. Interior lip injury is non-penetrating, superficial, no indication for laceration repair at this time. Tetanus UTD. Will place on amoxicillin for prophylaxis. Patient advised to avoid spicy or salty foods.  Eat soft foods for 2-3 days. Rinse after eating.  Bacitracin applied to superficial abrasions. Patient will be discharged home & is agreeable with above plan. Returns precautions discussed. Pt appears safe for discharge.  I personally performed the services described in this documentation, which was scribed in my presence. The recorded information has been reviewed and is accurate.    Gloriann Loan, PA-C 11/02/15 [place], MD 11/03/15 [phone]

## 2015-11-02 NOTE — Discharge Instructions (Signed)
Your x-ray is normal .  Take ibuprofen three times daily as needed for pain.  You may also take Tylenol every 6 hours as needed for pain. Avoid spicy or salty foods. Eat soft foods for 2-3 days. Rinse after eating.  Take Amoxicillin twice daily for the next 7 days.  Apply Bacitracin twice daily to your chin and your knee.  Follow up with your primary care physician in 1 week.  Return to the ED if you experience worsening pain, increased redness, swelling, warmth, or pus from your abrasions or your lip.  Abrasion An abrasion is a cut or scrape on the outer surface of your skin. An abrasion does not extend through all of the layers of your skin. It is important to care for your abrasion properly to prevent infection. CAUSES Most abrasions are caused by falling on or gliding across the ground or another surface. When your skin rubs on something, the outer and inner layer of skin rubs off.  SYMPTOMS A cut or scrape is the main symptom of this condition. The scrape may be bleeding, or it may appear red or pink. If there was an associated fall, there may be an underlying bruise. DIAGNOSIS An abrasion is diagnosed with a physical exam. TREATMENT Treatment for this condition depends on how large and deep the abrasion is. Usually, your abrasion will be cleaned with water and mild soap. This removes any dirt or debris that may be stuck. An antibiotic ointment may be applied to the abrasion to help prevent infection. A bandage (dressing) may be placed on the abrasion to keep it clean. You may also need a tetanus shot. HOME CARE INSTRUCTIONS Medicines  Take or apply medicines only as directed by your health care provider.  If you were prescribed an antibiotic ointment, finish all of it even if you start to feel better. Wound Care  Clean the wound with mild soap and water 2-3 times per day or as directed by your health care provider. Pat your wound dry with a clean towel. Do not rub it.  There are many  different ways to close and cover a wound. Follow instructions from your health care provider about:  Wound care.  Dressing changes and removal.  Check your wound every day for signs of infection. Watch for:  Redness, swelling, or pain.  Fluid, blood, or pus. General Instructions  Keep the dressing dry as directed by your health care provider. Do not take baths, swim, use a hot tub, or do anything that would put your wound underwater until your health care provider approves.  If there is swelling, raise (elevate) the injured area above the level of your heart while you are sitting or lying down.  Keep all follow-up visits as directed by your health care provider. This is important. SEEK MEDICAL CARE IF:  You received a tetanus shot and you have swelling, severe pain, redness, or bleeding at the injection site.  Your pain is not controlled with medicine.  You have increased redness, swelling, or pain at the site of your wound. SEEK IMMEDIATE MEDICAL CARE IF:  You have a red streak going away from your wound.  You have a fever.  You have fluid, blood, or pus coming from your wound.  You notice a bad smell coming from your wound or your dressing.   This information is not intended to replace advice given to you by your health care provider. Make sure you discuss any questions you have with your health care  provider.   Document Released: 03/23/2005 Document Revised: 03/04/2015 Document Reviewed: 06/11/2014 Elsevier Interactive Patient Education 2016 [employer] Mouth Laceration  A mouth laceration is a deep cut in the lining of your mouth (mucosa). The laceration may extend into your lip or go all of the way through your mouth and cheek. Lacerations inside your mouth may involve your tongue, the insides of your cheeks, or the upper surface of your mouth (palate).  Mouth lacerations may bleed a lot because your mouth has a very rich blood supply. Mouth lacerations may need to  be repaired with stitches (sutures).  CAUSES  Any type of facial injury can cause a mouth laceration. Common causes include:  Getting hit in the mouth.  Being in a car accident. SYMPTOMS  The most common sign of a mouth laceration is bleeding that fills the mouth.  DIAGNOSIS  Your health care provider can diagnose a mouth laceration by examining your mouth. Your mouth may need to be washed out (irrigated) with a sterile salt-water (saline) solution. Your health care provider may also have to remove any blood clots to determine how bad your injury is. You may need X-rays of the bones in your jaw or your face to rule out other injuries, such as dental injuries, facial fractures, or jaw fractures.  TREATMENT  Treatment depends on the location and severity of your injury. Small mouth lacerations may not need treatment if bleeding has stopped. You may need sutures if:  You have a tongue laceration.  Your mouth laceration is large or deep, or it continues to bleed. If sutures are necessary, your health care provider will use absorbable sutures that dissolve as your body heals. You may also receive antibiotic medicine or a tetanus shot.  HOME CARE INSTRUCTIONS  Take medicines only as directed by your health care provider.  If you were prescribed an antibiotic medicine, finish all of it even if you start to feel better.  Eat as directed by your health care provider. You may only be able to drink liquids or eat soft foods for a few days.  Rinse your mouth with a warm, salt-water rinse 4-6 times per day or as directed by your health care provider. You can make a salt-water rinse by mixing one tsp of salt into two cups of warm water.  Do not poke the sutures with your tongue. Doing that can loosen them.  Check your wound every day for signs of infection. It is normal to have a white or gray patch over your wound while it heals. Watch for:  Redness.  Swelling.  Blood or pus. Maintain regular oral  hygiene, if possible. Gently brush your teeth with a soft, nylon-bristled toothbrush 2 times per day.  Keep all follow-up visits as directed by your health care provider. This is important. SEEK MEDICAL CARE IF:  You were given a tetanus shot and have swelling, severe pain, redness, or bleeding at the injection site.  You have a fever.  Your pain is not controlled with medicine.  You have redness, swelling, or pain at your wound that is getting worse.  You have fresh bleeding or pus coming from your wound.  The edges of your wound break open.  You develop swollen, tender glands in your throat. SEEK IMMEDIATE MEDICAL CARE IF:  Your face or the area under your jaw becomes swollen.  You have trouble breathing or swallowing. This information is not intended to replace advice given to you by your health care provider. Make  sure you discuss any questions you have with your health care provider.  Document Released: 06/13/2005 Document Revised: 10/28/2014 Document Reviewed: 06/04/2014  Elsevier Interactive Patient Education [employer].

## 2015-11-02 NOTE — ED Notes (Signed)
Pt stated that she was walking and tripped and fell, impact to chin and lower lip. Puncture wound to lower lip noted. Bleeding controlled. Pt c/o r/knee pain

## 2015-12-16 ENCOUNTER — Telehealth: Payer: Self-pay | Admitting: Family Medicine

## 2015-12-16 NOTE — Telephone Encounter (Signed)
Pt. Called requesting an appointment with her PCP. Pt. Was told that her PCP did not have anything available and she stated if she could see another provider. Pt. Was told that we had an appointment available for tomorrow in the walk in clinic and she stated if the doctor was an MD or PA.  I told pt. That it is a PA and she said no to the appointment. Pt. Requested to speak with her PCP. Please f/u

## 2015-12-17 NOTE — Telephone Encounter (Signed)
Called to patient Left VM Requested patient call back. Gave office #.  Requested when she call she schedule a f;u appt Advised that she call first thing in the AM on 12/30/15 for a July appointment.

## 2016-04-13 ENCOUNTER — Other Ambulatory Visit: Payer: Self-pay | Admitting: Family Medicine

## 2016-04-13 DIAGNOSIS — Z1231 Encounter for screening mammogram for malignant neoplasm of breast: Secondary | ICD-10-CM

## 2016-04-19 ENCOUNTER — Ambulatory Visit: Payer: BLUE CROSS/BLUE SHIELD | Attending: Internal Medicine

## 2016-04-19 DIAGNOSIS — Z23 Encounter for immunization: Secondary | ICD-10-CM

## 2016-04-19 NOTE — Progress Notes (Signed)
m °

## 2016-04-20 ENCOUNTER — Telehealth: Payer: Self-pay | Admitting: Family Medicine

## 2016-04-20 NOTE — Telephone Encounter (Signed)
Patient called to schedule a sooner appt. With PCP. Patient is re-establishing care, at this moment we don;t have appointments available. Patient has a schedule appointment for 11/13, I offered to place patient on wait list and patient became upset and stated she is always told something different and hung up

## 2016-04-25 ENCOUNTER — Ambulatory Visit (INDEPENDENT_AMBULATORY_CARE_PROVIDER_SITE_OTHER)
Admission: RE | Admit: 2016-04-25 | Discharge: 2016-04-25 | Disposition: A | Discharge status: Home / Self Care | Payer: BLUE CROSS/BLUE SHIELD | Source: Ambulatory Visit | Attending: Family Medicine | Admitting: Family Medicine

## 2016-04-25 ENCOUNTER — Ambulatory Visit (INDEPENDENT_AMBULATORY_CARE_PROVIDER_SITE_OTHER): Payer: BLUE CROSS/BLUE SHIELD | Admitting: Family Medicine

## 2016-04-25 ENCOUNTER — Encounter: Payer: Self-pay | Admitting: Family Medicine

## 2016-04-25 VITALS — BP 132/80 | HR 86 | Resp 12 | Ht 71.0 in | Wt 196.0 lb

## 2016-04-25 DIAGNOSIS — M7711 Lateral epicondylitis, right elbow: Secondary | ICD-10-CM | POA: Diagnosis not present

## 2016-04-25 DIAGNOSIS — R05 Cough: Secondary | ICD-10-CM

## 2016-04-25 DIAGNOSIS — K219 Gastro-esophageal reflux disease without esophagitis: Secondary | ICD-10-CM | POA: Diagnosis not present

## 2016-04-25 DIAGNOSIS — I10 Essential (primary) hypertension: Secondary | ICD-10-CM | POA: Diagnosis not present

## 2016-04-25 DIAGNOSIS — R059 Cough, unspecified: Secondary | ICD-10-CM

## 2016-04-25 MED ORDER — OMEPRAZOLE 40 MG PO CPDR
40.0000 mg | DELAYED_RELEASE_CAPSULE | Freq: Every day | ORAL | 2 refills | Status: DC
Start: 2016-04-25 — End: 2016-06-16

## 2016-04-25 NOTE — Progress Notes (Signed)
HPI:   Kimberly [name] is a 60 y.o. female, who is here today to establish care with me.  Former PCP: Dr Adrian Blackwater. She follows with endocrinologists, Dr Carlis Abbott, for DM II.  Last preventive routine visit: 1-2 years ago.  She has Hx of HTN,HLD, DM II among some.   Concerns today: Cough and RUE pain. Poor historian, filling out forms during visit.   Right upper extremity pain, which she states that has had for "sometime" but getting worse. Started initially in right wrist, then forearm, and now elbow to mid arm. Sometimes shoulder pain. She has not noted limitation of ROM. No numbness or burning sensation. Pain is described as sore/achy, sometimes it is "bad", constant. Exacerbated by certain activities, movement and palpation of elbow, mild edema, and no erythema. Alleviated by rest and OTC Motrin.  According to patient, she has addressed this problem with other providers and she was told it was "old age."  She has had intermittent lower back pain for years, stable, no radiated, exacerbated by certain activities. Currently she is not having cervical pain but states that she has had it before. She denies any associated skin rash.   Cough: Today she is also c/o 2 months of productive cough with whitish sputum. She denies associated chest pain, wheezing, or dyspnea. She has history of allergies, was on Flonase nasal spray. According to patient, she was also told she had asthma. In the past she has been on albuterol inhaler as needed, she is not using any currently.  She also has history of GERD, has not been on PPI for about a year. Heartburn exacerbated by spicy food. Cough is during the day and at night.  Denies abdominal pain, nausea, vomiting, changes in bowel habits, blood in stool or melena.  Denies tobacco use.  Hypertension:  Currently on Cozaar 50 mg daily and HCTZ 25 mg daily. She has not noted unusual headache, visual changes, exertional chest pain,  dyspnea,  focal weakness, or edema.   Lab Results  Component Value Date   CREATININE 0.82 07/27/2013   BUN 12 07/27/2013   NA 141 07/27/2013   K 3.9 07/27/2013   CL 104 07/27/2013   CO2 27 07/27/2013    HLD, she is asking if she needs to continue Zocor. She is not reporting side effects.  Hx of DM II, she is currently following with endocrinologists.      Review of Systems  Constitutional: Negative for activity change, appetite change, fatigue, fever and unexpected weight change.  HENT: Positive for postnasal drip. Negative for ear pain, mouth sores, sinus pressure, sneezing, sore throat, trouble swallowing and voice change.   Eyes: Negative for discharge, redness, itching and visual disturbance.  Respiratory: Positive for cough. Negative for shortness of breath, wheezing and stridor.   Cardiovascular: Negative for chest pain, palpitations and leg swelling.  Gastrointestinal: Negative for abdominal pain, nausea and vomiting.       No changes in bowel habits.  Genitourinary: Negative for decreased urine volume, difficulty urinating and hematuria.  Musculoskeletal: Positive for arthralgias, back pain and joint swelling. Negative for neck pain.  Skin: Negative for color change and rash.  Allergic/Immunologic: Positive for environmental allergies.  Neurological: Positive for numbness (occasionally on toes, stable.). Negative for syncope, weakness and headaches.  Hematological: Negative for adenopathy. Does not bruise/bleed easily.  Psychiatric/Behavioral: Negative for confusion. The patient is nervous/anxious.       Current Outpatient Prescriptions on File Prior to Visit  Medication Sig  Dispense Refill  . acetaminophen (TYLENOL) 500 MG tablet Take 1 tablet (500 mg total) by mouth every 6 (six) hours as needed. 30 tablet 0  . aspirin EC 81 MG tablet Take 1 tablet (81 mg total) by mouth daily.    . Blood Glucose Monitoring Suppl (BLOOD GLUCOSE METER) kit Use as instructed 1  each 0  . glyBURIDE (DIABETA) 5 MG tablet TAKE 1 TABLET (5 MG TOTAL) BY MOUTH 2 (TWO) TIMES DAILY WITH A MEAL. 30 tablet 2  . hydrochlorothiazide (HYDRODIURIL) 25 MG tablet TAKE 1 TABLET (25 MG TOTAL) BY MOUTH DAILY. 30 tablet 0  . insulin glargine (LANTUS) 100 UNIT/ML injection Inject 0.65 mLs (65 Units total) into the skin at bedtime. 10 mL 11  . simvastatin (ZOCOR) 20 MG tablet Take 1 tablet (20 mg total) by mouth at bedtime. 30 tablet 3  . TRUEPLUS LANCETS 28G MISC 1 each by Does not apply route 3 (three) times daily. 100 each 11  . TRUETEST TEST test strip USE AS DIRECTED BY MD 50 each 12  . budesonide-formoterol (SYMBICORT) 160-4.5 MCG/ACT inhaler Inhale 2 puffs into the lungs 2 (two) times daily. (Patient not taking: Reported on 04/25/2016) 1 Inhaler 3  . losartan (COZAAR) 50 MG tablet Take 1 tablet (50 mg total) by mouth daily. (Patient not taking: Reported on 04/25/2016) 90 tablet 3  . metFORMIN (GLUCOPHAGE) 1000 MG tablet TAKE 1 TABLET (1,000 MG TOTAL) BY MOUTH 2 (TWO) TIMES DAILY WITH A MEAL. 60 tablet 3   No current facility-administered medications on file prior to visit.      Past Medical History:  Diagnosis Date  . Diabetes mellitus 2001  . Hyperlipidemia Dx 2012  . Hypertension 2001   Allergies  Allergen Reactions  . Ace Inhibitors Cough    Family History  Problem Relation Age of Onset  . Depression Mother   . Hypertension Mother   . Heart disease Father   . Cancer Sister   . Cancer Brother     Social History   Social History  . Marital status: Divorced    Spouse name: N/A  . Number of children: N/A  . Years of education: N/A   Social History Main Topics  . Smoking status: Never Smoker  . Smokeless tobacco: None  . Alcohol use Yes     Comment: occ  . Drug use: No  . Sexual activity: Not Asked   Other Topics Concern  . None   Social History Narrative  . None    Vitals:   04/25/16 1401  BP: 132/80  Pulse: 86  Resp: 12   O2 sat 98% at  RA.  Body mass index is 27.34 kg/m.    Physical Exam  Nursing note and vitals reviewed. Constitutional: She is oriented to person, place, and time. She appears well-developed. No distress.  HENT:  Head: Atraumatic.  Mouth/Throat: Oropharynx is clear and moist and mucous membranes are normal.  Eyes: Conjunctivae and EOM are normal. Pupils are equal, round, and reactive to light.  Neck: No JVD present. No tracheal deviation present. Thyromegaly (mild) present. No thyroid mass present.  Cardiovascular: Normal rate and regular rhythm.   No murmur heard. Pulses:      Dorsalis pedis pulses are 2+ on the right side, and 2+ on the left side.  Respiratory: Effort normal and breath sounds normal. No respiratory distress.  GI: Soft. She exhibits no mass. There is no hepatomegaly. There is no tenderness.  Musculoskeletal: She exhibits no edema.  Shoulder  ROM normal bilateral. Right UE: No tenderness upon palpation of shoulder. Pain with palpation of lateral epicondyle, no pain of medial one. Mild edema on lateral epicondyle, no erythema. Pain elicited by supination with resistance >> pronation. Pain upon palpation of proximal forearm muscles (radial aspect). No tenderness upon palpation of paraspinal muscles cervical, thoracic, and lumbar bilateral.  Lymphadenopathy:    She has no cervical adenopathy.  Neurological: She is alert and oriented to person, place, and time. She has normal strength. Coordination normal.  Skin: Skin is warm. No rash noted. No erythema.  Psychiatric: Her mood appears anxious.  Well groomed, poor eye contact.      ASSESSMENT AND PLAN:     Sherral was seen today for establish care.  Diagnoses and all orders for this visit:  Right lateral epicondylitis  Seems chronic. Finding today are suggestive of epicondylitis. We discussed other possible causes of upper extremity pain. Since no Hx of trauma, I do not think imaging is needed today. PT will be  arranged. Ortho referral will be considered if not improvement after PT and iontophoresis.    -     Ambulatory referral to Physical Therapy  Gastroesophageal reflux disease, esophagitis presence not specified  GERD precautions discussed. She agrees with 4-6 weeks of Omeprazole. We can decrease dose to 20 mg after symptoms improved. F/U in 6 weeks.  -     omeprazole (PRILOSEC) 40 MG capsule; Take 1 capsule (40 mg total) by mouth daily.  Essential hypertension  No changes today. Low salt diet and regular eye examination. Labs next OV.  Cough  We discussed possible causes: allergies,asthma, s/p URI, and GERD among some. She is not reporting wheezing or dyspnea, so will hold on resuming inhalers for now.  Further recommendations will be given according to CXR results. F/U in 6 weeks, before if needed.  -     DG Chest 2 View; Future       -She will continue following with Dr Carlis Abbott for DM II. -Next OV I am planning on FLP, will continue stain for now.        Romayne Ticas G. Martinique, MD  [facility]. [place] office.

## 2016-04-25 NOTE — Patient Instructions (Addendum)
A few things to remember from today's visit:   Right lateral epicondylitis - Plan: Ambulatory referral to Physical Therapy  Essential hypertension  Gastroesophageal reflux disease, esophagitis presence not specified - Plan: omeprazole (PRILOSEC) 40 MG capsule  Cough - Plan: DG Chest 2 View    Avoid foods that make your symptoms worse, for example coffee, chocolate,pepermeint,alcohol, and greasy food. Raising the head of your bed about 6 inches may help with nocturnal symptoms.   Weight loss (if you are overweight). Avoid lying down for 3 hours after eating.  Instead 3 large meals daily try small and more frequent meals during the day.   You should be evaluated immediately if bloody vomiting, bloody stools, black stools (like tar), difficulty swallowing, food gets stuck on the way down or choking when eating. Abnormal weight loss or severe abdominal pain.  If symptoms are not resolved sometimes endoscopy is necessary.   Labs next visit Please be sure medication list is accurate. If a new problem present, please set up appointment sooner than planned today.

## 2016-04-25 NOTE — Progress Notes (Signed)
Pre visit review using our clinic review tool, if applicable. No additional management support is needed unless otherwise documented below in the visit note. 

## 2016-05-02 ENCOUNTER — Ambulatory Visit
Admission: RE | Admit: 2016-05-02 | Discharge: 2016-05-02 | Disposition: A | Discharge status: Home / Self Care | Payer: BLUE CROSS/BLUE SHIELD | Source: Ambulatory Visit | Attending: Family Medicine | Admitting: Family Medicine

## 2016-05-02 DIAGNOSIS — Z1231 Encounter for screening mammogram for malignant neoplasm of breast: Secondary | ICD-10-CM

## 2016-05-06 ENCOUNTER — Ambulatory Visit: Payer: BLUE CROSS/BLUE SHIELD | Attending: Family Medicine | Admitting: Physical Therapy

## 2016-05-06 ENCOUNTER — Encounter: Payer: Self-pay | Admitting: Physical Therapy

## 2016-05-06 DIAGNOSIS — M6281 Muscle weakness (generalized): Secondary | ICD-10-CM | POA: Diagnosis present

## 2016-05-06 DIAGNOSIS — R293 Abnormal posture: Secondary | ICD-10-CM | POA: Insufficient documentation

## 2016-05-06 DIAGNOSIS — M25521 Pain in right elbow: Secondary | ICD-10-CM

## 2016-05-06 NOTE — Therapy (Signed)
[facility] [address], [place], [place], [address] Phone: [phone]   Fax:  [phone]  Physical Therapy Evaluation  Patient Details  Name: Kimberly [name] MRN: [id number] Date of Birth: [birth year] Referring Provider: Betty G Martinique, MD  Encounter Date: 05/06/2016      PT End of Session - 05/06/16 2050    Visit Number 1   Date for PT Re-Evaluation 07/01/16   PT Start Time 1020   PT Stop Time 1105   PT Time Calculation (min) 45 min   Activity Tolerance Patient tolerated treatment well   Behavior During Therapy [facility] for tasks assessed/performed      Past Medical History:  Diagnosis Date  . Diabetes mellitus 2001  . Hyperlipidemia Dx 2012  . Hypertension 2001    Past Surgical History:  Procedure Laterality Date  . ABDOMINAL HYSTERECTOMY      There were no vitals filed for this visit.       Subjective Assessment - 05/06/16 1030    Subjective Pt states pain began in Sept in elbow/arm.  States she feels like it started in her Rt wrist which has been painful for years.  Feels like it moving up her arm.  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  Pt states she fell in June with outstretched arms, but she didn't notice increase arm pain in her arm at that time because her pain was mostly jaw and knee pain.    Pertinent History HTN, DM type II   Limitations Lifting;House hold activities;Other (comment)  reaching overhead, writing   How long can you sit comfortably? not limited   How long can you stand comfortably? not limited   How long can you walk comfortably? not limited   Patient Stated Goals to get rid of pain, be able to do ADLs without pain   Currently in Pain? Yes   Pain Score 8   5/10 with NSAIDS   Pain Location Elbow  feel it up and down arm, elbow is worst   Pain Orientation Right   Pain Descriptors / Indicators Throbbing;Sore;Radiating   Pain Type Acute pain   Pain Radiating Towards wrist  and back of arm towards shoulder   Pain Onset More than a month ago   Pain Frequency Constant   Aggravating Factors  turning the steering wheel, washing back, wakes at night about 1x   Pain Relieving Factors NSAIDS   Effect of Pain on Daily Activities driving and showering, lifting bags, writing on papers for students is painful and difficult   Multiple Pain Sites No            [facility] PT Assessment - 05/06/16 0001      Assessment   Medical Diagnosis M77.11 (ICD-10-CM) - Right lateral epicondylitis   Referring Provider Betty G Martinique, MD   Onset Date/Surgical Date 03/08/16   Hand Dominance Right   Prior Therapy no     Precautions   Precautions None     Restrictions   Weight Bearing Restrictions No     Balance Screen   Has the patient fallen in the past 6 months Yes   How many times? 1   Has the patient had a decrease in activity level because of a fear of falling?  No   Is the patient reluctant to leave their home because of a fear of falling?  No     Home Ecologist residence     Prior Function  Level of Independence Independent   Vocation Part time employment   Pensions consultant     Cognition   Overall Cognitive Status Within Functional Limits for tasks assessed     Observation/Other Assessments   Focus on Therapeutic Outcomes (FOTO)  62% limitation  42% limitation - goal     Posture/Postural Control   Posture/Postural Control Postural limitations   Postural Limitations Rounded Shoulders;Forward head     AROM   Right Shoulder Flexion 135 Degrees  +pain   Right Shoulder ABduction 90 Degrees  +pain   Left Shoulder Flexion 150 Degrees   Left Shoulder ABduction 128 Degrees   Right Elbow Flexion 136  +pain   Right Elbow Extension -28  +pain   Right Wrist Extension 30 Degrees  +pain   Right Wrist Flexion 30 Degrees  +pain   Cervical Flexion WNL   Cervical Extension WNL  +pain   Cervical - Left Side  Bend 20 deg  + elbow pain Rt UE   Cervical - Left Rotation 45 deg   +elbow pain Rt     Strength   Overall Strength Comments Right UE grossly 3+/5 due to pain     Palpation   Palpation comment tender to palpation: R forearem flexors, extensors, lateral epicondyle, tricep insertion  muscle tightness of pec major/minor, suboccipitals                   [facility] Adult PT Treatment/Exercise - 05/06/16 0001      Shoulder Exercises: Stretch   Other Shoulder Stretches pec open book stretch  educated and performed 3x30 sec                PT Education - 05/06/16 2049    Education provided Yes   Education Details educated about plan and current condition and posture, edu on doing open book Corporate treasurer) Educated Patient   Methods Explanation;Tactile cues;Verbal cues   Comprehension Verbalized understanding          PT Short Term Goals - 05/06/16 2222      PT SHORT TERM GOAL #1   Title independent with initial HEP   Time 4   Period Weeks   Status New     PT SHORT TERM GOAL #2   Title increased shoulder AROM to Rt shoulder flex 160 deg, abduction 130 deg in order to improve functional reaching overhead   Time 4   Period Weeks   Status New     PT SHORT TERM GOAL #3   Title decrease pain by 50% in due to improved posture and ability to maintain improved posture throughout the day.   Time 4   Period Weeks   Status New           PT Long Term Goals - 05/06/16 2224      PT LONG TERM GOAL #1   Title FOTO < or = to 42%   Baseline 62%   Time 8   Period Weeks   Status New     PT LONG TERM GOAL #2   Title independent with advanced HEP as part of maintenance plan   Time 8   Period Weeks   Status New     PT LONG TERM GOAL #3   Title able to drive without increased pain when looking over shoulder or using steering wheel to turn.   Time 8   Period Weeks     PT LONG TERM GOAL #4   Title  demonstrate 4+/5 strength of elbow and wrist  flexion/extension for improved ability to perform job responsibilities and functional lifting of bags at least 10 lbs.   Time 8   Period Weeks   Status New               Plan - 05/06/16 2151    Clinical Impression Statement Pt received moderate complex eval due to worsening condition and comorbidities including DM, HTN, anxiety, and multiple regions implicated in treatment including Rt wrist, elbow and neck which all appear to have involvement in patient's condition.  Pt presents with pain in Rt elbow and has signs of tendinities to the region, but also has increased pain with certain cervical motions showing some involvement of nerve tissue.  Pt demonstrates decreased cervical, shoulder, elbow and wrist ROM to <75% grossly in Rt UE, decreased strength of grossly 3+/5 in Rt UE, hypomiobility of C2/3/4/5, and pain radiating throughout Rt UE.  Due to these deficits pt is not able to efficiently perform ADLs and job functons.  Pt is recommended skilled PT for the interventions listed below in order to reduce pain and improve function.   Rehab Potential Excellent   Clinical Impairments Affecting Rehab Potential none   PT Frequency 2x / week   PT Duration 8 weeks   PT Treatment/Interventions Electrical Stimulation;Moist Heat;Ultrasound;Therapeutic activities;Therapeutic exercise;Neuromuscular re-education;Patient/family education;Manual techniques;Taping;Passive range of motion   PT Next Visit Plan postural re-education, strengthening mid/low traps, pec stretches, manual to wrist flexors/extensors and triceps, cervical PROM and joint mobilization as needed   PT Home Exercise Plan open book stretches, add cervical retractions if able, progress as needed   Recommended Other Services none   Consulted and Agree with Plan of Care Patient      Patient will benefit from skilled therapeutic intervention in order to improve the following deficits and impairments:  Pain, Postural dysfunction, Decreased  range of motion, Hypomobility, Impaired UE functional use, Increased muscle spasms, Impaired flexibility  Visit Diagnosis: Abnormal posture - Plan: PT plan of care cert/re-cert  Muscle weakness (generalized) - Plan: PT plan of care cert/re-cert  Pain in right elbow - Plan: PT plan of care cert/re-cert     Problem List Patient Active Problem List   Diagnosis Date Noted  . Cataract, left eye 10/17/2014  . Foot callus 08/21/2014  . GERD (gastroesophageal reflux disease) 04/15/2014  . Situational anxiety 04/02/2014  . Healthcare maintenance 04/02/2014  . Blurry vision 02/24/2014  . Hypertension 05/16/2012  . Overweight (BMI 25.0-29.9) 05/16/2012  . Enlarged thyroid 05/16/2012  . Hyperlipidemia LDL goal < 100 05/16/2012  . DM type 2 (diabetes mellitus, type 2) ([place]) 05/14/2012    [name], PT 05/06/2016, 10:33 PM  [facility] [address], [place], [place], [address] Phone: [phone]   Fax:  [phone]  Name: Daniel Johndrow MRN: [id number] Date of Birth: 1955/11/01

## 2016-05-09 ENCOUNTER — Ambulatory Visit: Payer: BLUE CROSS/BLUE SHIELD | Admitting: Family Medicine

## 2016-05-10 ENCOUNTER — Ambulatory Visit: Payer: BLUE CROSS/BLUE SHIELD | Admitting: Physical Therapy

## 2016-05-10 DIAGNOSIS — R293 Abnormal posture: Secondary | ICD-10-CM

## 2016-05-10 DIAGNOSIS — M25521 Pain in right elbow: Secondary | ICD-10-CM

## 2016-05-10 DIAGNOSIS — M6281 Muscle weakness (generalized): Secondary | ICD-10-CM

## 2016-05-10 NOTE — Therapy (Signed)
[facility] [address] [place], [place], [address] Phone: [phone]   Fax:  [phone]  Physical Therapy Treatment  Patient Details  Name: Kimberly [name] MRN: [id number] Date of Birth: Mar 14, 1956 Referring Provider: Betty G Martinique, MD  Encounter Date: 05/10/2016      PT End of Session - 05/10/16 1617    Visit Number 2   Date for PT Re-Evaluation 07/01/16   PT Start Time 0349   PT Stop Time 1705   PT Time Calculation (min) 50 min   Activity Tolerance Patient tolerated treatment well   Behavior During Therapy [facility] for tasks assessed/performed      Past Medical History:  Diagnosis Date  . Diabetes mellitus 2001  . Hyperlipidemia Dx 2012  . Hypertension 2001    Past Surgical History:  Procedure Laterality Date  . ABDOMINAL HYSTERECTOMY      There were no vitals filed for this visit.      Subjective Assessment - 05/10/16 1614    Subjective Pt reports arm not hurting as bad as it was last time she was here. Rt arm pain 6/10 neck pain 5/10   Pertinent History HTN, DM type II   Limitations Lifting;House hold activities;Other (comment)   How long can you sit comfortably? not limited   How long can you stand comfortably? not limited   How long can you walk comfortably? not limited   Patient Stated Goals to get rid of pain, be able to do ADLs without pain   Currently in Pain? Yes   Pain Score 6    Pain Location Elbow   Pain Orientation Right   Pain Descriptors / Indicators Throbbing;Sore;Radiating   Pain Type Acute pain   Pain Onset More than a month ago   Pain Frequency Constant                         [facility] Adult PT Treatment/Exercise - 05/10/16 0001      Exercises   Exercises Wrist;Shoulder;Elbow     Elbow Exercises   Elbow Extension Strengthening;Right;20 reps   Theraband Level (Elbow Extension) Level 2 (Red)   Forearm Supination Strengthening;Right;10 reps;Seated  #2;  elbow on table   Forearm Pronation Strengthening;Right;10 reps;Seated  #2; elbow on table   Wrist Flexion Strengthening;Right;20 reps;Seated  #2   Wrist Extension Strengthening;Right;20 reps;Seated  #2     Shoulder Exercises: Seated   Extension Strengthening;Right;20 reps;Theraband   Theraband Level (Shoulder Extension) Level 2 (Red)   Row Strengthening;Both;20 reps   Theraband Level (Shoulder Row) Level 2 (Red)   Horizontal ABduction Strengthening;Both;20 reps     Wrist Exercises   Other wrist exercises Velcro board x5     Modalities   Modalities Ultrasound     Ultrasound   Ultrasound Location Lt lateral epicondyle   Ultrasound Parameters 3.3 Mhz 50% 1.0   Ultrasound Goals Pain     Manual Therapy   Manual Therapy Soft tissue mobilization   Manual therapy comments Pt seated    Soft tissue mobilization upper traps and lower cervical paraspinals                  PT Short Term Goals - 05/06/16 2222      PT SHORT TERM GOAL #1   Title independent with initial HEP   Time 4   Period Weeks   Status New     PT SHORT TERM GOAL #2   Title increased shoulder  AROM to Rt shoulder flex 160 deg, abduction 130 deg in order to improve functional reaching overhead   Time 4   Period Weeks   Status New     PT SHORT TERM GOAL #3   Title decrease pain by 50% in due to improved posture and ability to maintain improved posture throughout the day.   Time 4   Period Weeks   Status New           PT Long Term Goals - 05/06/16 2224      PT LONG TERM GOAL #1   Title FOTO < or = to 42%   Baseline 62%   Time 8   Period Weeks   Status New     PT LONG TERM GOAL #2   Title independent with advanced HEP as part of maintenance plan   Time 8   Period Weeks   Status New     PT LONG TERM GOAL #3   Title able to drive without increased pain when looking over shoulder or using steering wheel to turn.   Time 8   Period Weeks     PT LONG TERM GOAL #4   Title demonstrate  4+/5 strength of elbow and wrist flexion/extension for improved ability to perform job responsibilities and functional lifting of bags at least 10 lbs.   Time 8   Period Weeks   Status New               Plan - 05/10/16 1631    Clinical Impression Statement Pt presents with forward head posture. Reports arm feeling better than last time but conitnues to be painful. Pt has difficulty with elbow extension, especially with shoulder flexion. Responded well to ultrasound and manual therapy. Pt will cotntinue to benfit from skilled therapy for Rt UE strenghtening and neck strength and stability.    Rehab Potential Excellent   Clinical Impairments Affecting Rehab Potential none   PT Treatment/Interventions Electrical Stimulation;Moist Heat;Ultrasound;Therapeutic activities;Therapeutic exercise;Neuromuscular re-education;Patient/family education;Manual techniques;Taping;Passive range of motion   PT Next Visit Plan Postural strenghtening and education, elbow and shoulder strength, ultrasound to elbow, manual to neck   Consulted and Agree with Plan of Care Patient      Patient will benefit from skilled therapeutic intervention in order to improve the following deficits and impairments:  Pain, Postural dysfunction, Decreased range of motion, Hypomobility, Impaired UE functional use, Increased muscle spasms, Impaired flexibility  Visit Diagnosis: Abnormal posture  Muscle weakness (generalized)  Pain in right elbow     Problem List Patient Active Problem List   Diagnosis Date Noted  . Cataract, left eye 10/17/2014  . Foot callus 08/21/2014  . GERD (gastroesophageal reflux disease) 04/15/2014  . Situational anxiety 04/02/2014  . Healthcare maintenance 04/02/2014  . Blurry vision 02/24/2014  . Hypertension 05/16/2012  . Overweight (BMI 25.0-29.9) 05/16/2012  . Enlarged thyroid 05/16/2012  . Hyperlipidemia LDL goal < 100 05/16/2012  . DM type 2 (diabetes mellitus, type 2) ([place])  05/14/2012    Mikle Bosworth PTA 05/10/2016, 5:13 PM  [facility] [address] [place], [place], [address] Phone: [phone]   Fax:  [phone]  Name: Kimberly [name] MRN: [id number] Date of Birth: 04/28/1956

## 2016-05-17 ENCOUNTER — Encounter: Payer: Self-pay | Admitting: Physical Therapy

## 2016-05-17 ENCOUNTER — Ambulatory Visit: Payer: BLUE CROSS/BLUE SHIELD | Admitting: Physical Therapy

## 2016-05-17 DIAGNOSIS — M25521 Pain in right elbow: Secondary | ICD-10-CM

## 2016-05-17 DIAGNOSIS — R293 Abnormal posture: Secondary | ICD-10-CM | POA: Diagnosis not present

## 2016-05-17 DIAGNOSIS — M6281 Muscle weakness (generalized): Secondary | ICD-10-CM

## 2016-05-17 NOTE — Therapy (Signed)
[facility] [address] [place], [place], [address] Phone: [phone]   Fax:  [phone]  Physical Therapy Treatment  Patient Details  Name: Kimberly [name] MRN: [id number] Date of Birth: 04/09/1956 Referring Provider: Betty G Martinique, MD  Encounter Date: 05/17/2016      PT End of Session - 05/17/16 1709    Visit Number 3   Date for PT Re-Evaluation 07/01/16   PT Start Time 1620   PT Stop Time 1705   PT Time Calculation (min) 45 min   Activity Tolerance Patient tolerated treatment well   Behavior During Therapy [facility] for tasks assessed/performed      Past Medical History:  Diagnosis Date  . Diabetes mellitus 2001  . Hyperlipidemia Dx 2012  . Hypertension 2001    Past Surgical History:  Procedure Laterality Date  . ABDOMINAL HYSTERECTOMY      There were no vitals filed for this visit.      Subjective Assessment - 05/17/16 1628    Subjective Pt states feels like it is a little better.     Patient Stated Goals to get rid of pain, be able to do ADLs without pain   Currently in Pain? Yes   Pain Score 6    Pain Location Elbow   Pain Orientation Right   Pain Descriptors / Indicators Aching;Throbbing;Radiating   Pain Type Acute pain   Pain Radiating Towards wrist and back of arm   Pain Onset More than a month ago   Pain Frequency Intermittent   Pain Relieving Factors taking aleve to be able to sleep   Multiple Pain Sites No                         [facility] Adult PT Treatment/Exercise - 05/17/16 0001      Elbow Exercises   Forearm Supination Strengthening;Right;10 reps;Seated  #2; elbow on table   Forearm Pronation Strengthening;Right;10 reps;Seated  #2; elbow on table (unable to do full ROM)   Wrist Flexion Strengthening;Right;20 reps;Seated  #2   Wrist Extension Strengthening;Right;20 reps;Seated  #2     Shoulder Exercises: Seated   Extension Strengthening;Right;Theraband;10  reps   Theraband Level (Shoulder Extension) Level 2 (Red)  over use of upper traps noted VC   Row Strengthening;Both;20 reps   Theraband Level (Shoulder Row) Level 2 (Red)   Horizontal ABduction Strengthening;15 reps;Theraband   Theraband Level (Shoulder Horizontal ABduction) Level 2 (Red)  VC for upright   External Rotation Strengthening;Both;15 reps;Theraband   Theraband Level (Shoulder External Rotation) Level 2 (Red)     Ultrasound   Ultrasound Location Rt lateral epicondyle   Ultrasound Parameters 3 MHz 20% 1.2   Ultrasound Goals Pain     Manual Therapy   Manual Therapy Soft tissue mobilization   Manual therapy comments supine   Soft tissue mobilization upper traps and upper/lower cervical paraspinals, suboccipitals, Rt pecs                  PT Short Term Goals - 05/17/16 1716      PT SHORT TERM GOAL #1   Title independent with initial HEP   Period Weeks   Status On-going     PT SHORT TERM GOAL #2   Title increased shoulder AROM to Rt shoulder flex 160 deg, abduction 130 deg in order to improve functional reaching overhead   Time 4   Period Weeks   Status On-going     PT SHORT  TERM GOAL #3   Title decrease pain by 50% in due to improved posture and ability to maintain improved posture throughout the day.   Time 4   Period Weeks   Status On-going           PT Long Term Goals - 05/06/16 2224      PT LONG TERM GOAL #1   Title FOTO < or = to 42%   Baseline 62%   Time 8   Period Weeks   Status New     PT LONG TERM GOAL #2   Title independent with advanced HEP as part of maintenance plan   Time 8   Period Weeks   Status New     PT LONG TERM GOAL #3   Title able to drive without increased pain when looking over shoulder or using steering wheel to turn.   Time 8   Period Weeks     PT LONG TERM GOAL #4   Title demonstrate 4+/5 strength of elbow and wrist flexion/extension for improved ability to perform job responsibilities and functional  lifting of bags at least 10 lbs.   Time 8   Period Weeks   Status New               Plan - 05/17/16 1710    Clinical Impression Statement Pt presents with 6/10 pain and reports pain increases to 7/10 with exercises performed today.  Pt has very flat affect and seems to have difficulty keeping her eyes open.  Pt symptoms improved slightly with cues for improved upright posture.  After [place] and manual treatment pt reports 5/10 pain.  Pt continues to demonstrate postural limitations  and weakness causing increased pain.  Skilled PT needed for strength in order to improved posture and functional mobility to return to self care activities.   Rehab Potential Excellent   PT Frequency 2x / week   PT Duration 8 weeks   PT Treatment/Interventions Electrical Stimulation;Moist Heat;Ultrasound;Therapeutic activities;Therapeutic exercise;Neuromuscular re-education;Patient/family education;Manual techniques;Taping;Passive range of motion   PT Next Visit Plan Postural strenghtening and education, elbow and shoulder strength, ultrasound to elbow, manual to neck, cervical retraction and scap squeezes and add to HEP   PT Home Exercise Plan add cervical retractions, scap squeezes and forearm exercises as tolerated   Consulted and Agree with Plan of Care Patient      Patient will benefit from skilled therapeutic intervention in order to improve the following deficits and impairments:  Pain, Postural dysfunction, Decreased range of motion, Hypomobility, Impaired UE functional use, Increased muscle spasms, Impaired flexibility  Visit Diagnosis: Abnormal posture  Muscle weakness (generalized)  Pain in right elbow     Problem List Patient Active Problem List   Diagnosis Date Noted  . Cataract, left eye 10/17/2014  . Foot callus 08/21/2014  . GERD (gastroesophageal reflux disease) 04/15/2014  . Situational anxiety 04/02/2014  . Healthcare maintenance 04/02/2014  . Blurry vision 02/24/2014  .  Hypertension 05/16/2012  . Overweight (BMI 25.0-29.9) 05/16/2012  . Enlarged thyroid 05/16/2012  . Hyperlipidemia LDL goal < 100 05/16/2012  . DM type 2 (diabetes mellitus, type 2) ([place]) 05/14/2012    [name], PT 05/17/2016, 5:18 PM  [facility] [address], [place], [place], [address] Phone: [phone]   Fax:  [phone]  Name: Kimberly [name] MRN: [id number] Date of Birth: December 23, 1955

## 2016-05-18 LAB — LIPID PANEL
Cholesterol: 184 mg/dL (ref 0–200)
HDL: 45 mg/dL (ref 35–70)
LDL Cholesterol: 103 mg/dL
Triglycerides: 180 mg/dL — AB (ref 40–160)

## 2016-05-18 LAB — BASIC METABOLIC PANEL
BUN: 12 mg/dL (ref 4–21)
CREATININE: 0.9 mg/dL (ref 0.5–1.1)
Glucose: 176 mg/dL
Potassium: 4.2 mmol/L (ref 3.4–5.3)
Sodium: 139 mmol/L (ref 137–147)

## 2016-05-18 LAB — TSH: TSH: 1.5 u[IU]/mL (ref 0.41–5.90)

## 2016-05-18 LAB — HEMOGLOBIN A1C: HEMOGLOBIN A1C: 11.3

## 2016-05-18 LAB — CBC AND DIFFERENTIAL
HCT: 41 % (ref 36–46)
Hemoglobin: 13.7 g/dL (ref 12.0–16.0)
PLATELETS: 205 10*3/uL (ref 150–399)
WBC: 5.1 10^3/mL

## 2016-05-18 LAB — HEPATIC FUNCTION PANEL
ALK PHOS: 63 U/L (ref 25–125)
ALT: 39 U/L — AB (ref 7–35)
AST: 43 U/L — AB (ref 13–35)
Bilirubin, Total: 0.4 mg/dL

## 2016-05-24 ENCOUNTER — Encounter: Payer: BLUE CROSS/BLUE SHIELD | Admitting: Physical Therapy

## 2016-05-24 ENCOUNTER — Encounter: Payer: Self-pay | Admitting: Physical Therapy

## 2016-05-24 ENCOUNTER — Ambulatory Visit: Payer: BLUE CROSS/BLUE SHIELD | Admitting: Physical Therapy

## 2016-05-24 DIAGNOSIS — R293 Abnormal posture: Secondary | ICD-10-CM | POA: Diagnosis not present

## 2016-05-24 DIAGNOSIS — M25521 Pain in right elbow: Secondary | ICD-10-CM

## 2016-05-24 DIAGNOSIS — M6281 Muscle weakness (generalized): Secondary | ICD-10-CM

## 2016-05-24 NOTE — Therapy (Signed)
[facility] [address] [place], [place], [address] Phone: [phone]   Fax:  [phone]  Physical Therapy Treatment  Patient Details  Name: Kimberly [name] MRN: [id number] Date of Birth: May 27, 1956 Referring Provider: Betty G Martinique, MD  Encounter Date: 05/24/2016      PT End of Session - 05/24/16 1712    Visit Number 4   Date for PT Re-Evaluation 07/01/16   PT Start Time 1625   PT Stop Time 1705   PT Time Calculation (min) 40 min   Activity Tolerance Patient tolerated treatment well   Behavior During Therapy [facility] for tasks assessed/performed      Past Medical History:  Diagnosis Date  . Diabetes mellitus 2001  . Hyperlipidemia Dx 2012  . Hypertension 2001    Past Surgical History:  Procedure Laterality Date  . ABDOMINAL HYSTERECTOMY      There were no vitals filed for this visit.      Subjective Assessment - 05/24/16 1632    Subjective Pt states she has gone without pain most of this weeks   Pertinent History HTN, DM type II   Limitations Lifting;House hold activities;Other (comment)   How long can you sit comfortably? not limited   How long can you stand comfortably? not limited   How long can you walk comfortably? not limited   Patient Stated Goals to get rid of pain, be able to do ADLs without pain   Currently in Pain? Yes   Pain Score 2    Pain Location Arm   Pain Orientation Right   Pain Descriptors / Indicators Aching   Pain Type Acute pain   Pain Onset More than a month ago   Pain Frequency Intermittent   Pain Relieving Factors only needed aleve one night this week, was able to sleep other nights   Effect of Pain on Daily Activities prolongued activities   Multiple Pain Sites No                         [facility] Adult PT Treatment/Exercise - 05/24/16 0001      Shoulder Exercises: Standing   External Rotation Strengthening;Right;20 reps;Theraband   Theraband Level  (Shoulder External Rotation) Level 1 (Yellow)   Extension Strengthening;20 reps;Theraband   Theraband Level (Shoulder Extension) Level 1 (Yellow)   Row Strengthening;20 reps;Theraband   Theraband Level (Shoulder Row) Level 2 (Red)     Shoulder Exercises: ROM/Strengthening   UBE (Upper Arm Bike) 3 min each way Level 2   [place] flexion 15x     Ultrasound   Ultrasound Location Rt lateral epiconyle   Ultrasound Parameters 3Mhz 20% 1.3   Ultrasound Goals Pain     Manual Therapy   Manual Therapy Soft tissue mobilization   Manual therapy comments supine   Soft tissue mobilization Rt pecs, wrist flexor and extensors                PT Education - 05/24/16 1711    Education provided No          PT Short Term Goals - 05/24/16 1713      PT SHORT TERM GOAL #3   Title decrease pain by 50% in due to improved posture and ability to maintain improved posture throughout the day.   Time 4   Period Weeks   Status Achieved           PT Long Term Goals - 05/06/16 2224  PT LONG TERM GOAL #1   Title FOTO < or = to 42%   Baseline 62%   Time 8   Period Weeks   Status New     PT LONG TERM GOAL #2   Title independent with advanced HEP as part of maintenance plan   Time 8   Period Weeks   Status New     PT LONG TERM GOAL #3   Title able to drive without increased pain when looking over shoulder or using steering wheel to turn.   Time 8   Period Weeks     PT LONG TERM GOAL #4   Title demonstrate 4+/5 strength of elbow and wrist flexion/extension for improved ability to perform job responsibilities and functional lifting of bags at least 10 lbs.   Time 8   Period Weeks   Status New               Plan - 05/24/16 1714    Clinical Impression Statement Pt able to perform band exercises without reports of increased pain.  Pt fatigued quickly and needs constant cues for improved posture and to activated mid and low traps.  Pt responds well to soft tissue  release and has greater shoulder mobility after treatment.  Pt able to perform functional tasks with reduced pain.  She continues to need skilled PT to progress strength in order to work on greter endurance and improved posture.    Rehab Potential Excellent   Clinical Impairments Affecting Rehab Potential none   PT Frequency 2x / week   PT Duration 8 weeks   PT Treatment/Interventions Electrical Stimulation;Moist Heat;Ultrasound;Therapeutic activities;Therapeutic exercise;Neuromuscular re-education;Patient/family education;Manual techniques;Taping;Passive range of motion   PT Next Visit Plan mid/low trap strengthening, bicep and tricep strengthening, cont manual and work on thoracic extension as tolerated   PT Home Exercise Plan progress as tolerated   Consulted and Agree with Plan of Care Patient      Patient will benefit from skilled therapeutic intervention in order to improve the following deficits and impairments:  Pain, Postural dysfunction, Decreased range of motion, Hypomobility, Impaired UE functional use, Increased muscle spasms, Impaired flexibility  Visit Diagnosis: Abnormal posture  Muscle weakness (generalized)  Pain in right elbow     Problem List Patient Active Problem List   Diagnosis Date Noted  . Cataract, left eye 10/17/2014  . Foot callus 08/21/2014  . GERD (gastroesophageal reflux disease) 04/15/2014  . Situational anxiety 04/02/2014  . Healthcare maintenance 04/02/2014  . Blurry vision 02/24/2014  . Hypertension 05/16/2012  . Overweight (BMI 25.0-29.9) 05/16/2012  . Enlarged thyroid 05/16/2012  . Hyperlipidemia LDL goal < 100 05/16/2012  . DM type 2 (diabetes mellitus, type 2) ([place]) 05/14/2012    [name], PT 05/24/2016, 5:20 PM  [facility] [address], [place], [place], [address] Phone: [phone]   Fax:  [phone]  Name: Kimberly [name] MRN: [id number] Date of Birth:  02/11/1956

## 2016-05-25 ENCOUNTER — Encounter: Payer: BLUE CROSS/BLUE SHIELD | Admitting: Physical Therapy

## 2016-05-30 ENCOUNTER — Ambulatory Visit: Payer: BLUE CROSS/BLUE SHIELD | Attending: Family Medicine | Admitting: Physical Therapy

## 2016-05-30 ENCOUNTER — Encounter: Payer: Self-pay | Admitting: Physical Therapy

## 2016-05-30 DIAGNOSIS — R293 Abnormal posture: Secondary | ICD-10-CM | POA: Diagnosis not present

## 2016-05-30 DIAGNOSIS — M6281 Muscle weakness (generalized): Secondary | ICD-10-CM | POA: Diagnosis present

## 2016-05-30 DIAGNOSIS — M25521 Pain in right elbow: Secondary | ICD-10-CM | POA: Diagnosis present

## 2016-05-30 NOTE — Therapy (Signed)
[facility] [address], [place], [place], [address] Phone: [phone]   Fax:  [phone]  Physical Therapy Treatment  Patient Details  Name: Kimberly [name] MRN: [id number] Date of Birth: February 13, 1956 Referring Provider: Betty G Martinique, MD  Encounter Date: 05/30/2016      PT End of Session - 05/30/16 0802    Visit Number 5   Date for PT Re-Evaluation 07/01/16   PT Start Time 0802   PT Stop Time 0845   PT Time Calculation (min) 43 min   Activity Tolerance Patient tolerated treatment well   Behavior During Therapy [facility] for tasks assessed/performed      Past Medical History:  Diagnosis Date  . Diabetes mellitus 2001  . Hyperlipidemia Dx 2012  . Hypertension 2001    Past Surgical History:  Procedure Laterality Date  . ABDOMINAL HYSTERECTOMY      There were no vitals filed for this visit.      Subjective Assessment - 05/30/16 0802    Subjective Pt states "why does my arm still hurt"   Pertinent History HTN, DM type II   Limitations Lifting;House hold activities;Other (comment)   How long can you sit comfortably? not limited   How long can you stand comfortably? not limited   How long can you walk comfortably? not limited   Patient Stated Goals to get rid of pain, be able to do ADLs without pain   Currently in Pain? Yes   Pain Score 6    Pain Location Arm   Pain Descriptors / Indicators Aching   Pain Type Acute pain   Pain Radiating Towards wrist and back of forearm   Pain Onset More than a month ago   Pain Frequency Constant   Multiple Pain Sites No                         [facility] Adult PT Treatment/Exercise - 05/30/16 0001      Elbow Exercises   Elbow Extension Strengthening;Right;20 reps   Theraband Level (Elbow Extension) Level 2 (Red)   Forearm Supination Strengthening;Right;10 reps;Seated  #2; elbow on table   Forearm Pronation Strengthening;Right;10 reps;Seated  #3;  elbow on table (unable to do full ROM)   Wrist Flexion Strengthening;Right;20 reps;Seated  #3   Wrist Extension Strengthening;Right;20 reps;Seated  #3     Shoulder Exercises: Seated   Horizontal ABduction Strengthening;Theraband;20 reps   Theraband Level (Shoulder Horizontal ABduction) Level 2 (Red)   External Rotation Strengthening;Both;Theraband;20 reps   Theraband Level (Shoulder External Rotation) Level 2 (Red)     Shoulder Exercises: Standing   Extension Strengthening;20 reps;Theraband   Theraband Level (Shoulder Extension) Level 2 (Red)   Row Strengthening;20 reps;Theraband   Theraband Level (Shoulder Row) Level 2 (Red)     Shoulder Exercises: ROM/Strengthening   UBE (Upper Arm Bike) 3 min each way Level 2     Shoulder Exercises: Stretch   Other Shoulder Stretches upper trap stretch in sitting 3x20sec   Other Shoulder Stretches pec stretch     Manual Therapy   Manual Therapy Soft tissue mobilization   Manual therapy comments supine   Soft tissue mobilization cervical paraspinals, upper traps Rt, suboccipitals                PT Education - 05/30/16 1205    Education provided Yes   Education Details educated on update to HEP added stretches   Person(s) Educated Patient   Methods Explanation;Handout  Comprehension Verbalized understanding          PT Short Term Goals - 05/30/16 0814      PT SHORT TERM GOAL #1   Title independent with initial HEP   Time 4   Period Weeks   Status Achieved     PT SHORT TERM GOAL #2   Title increased shoulder AROM to Rt shoulder flex 160 deg, abduction 130 deg in order to improve functional reaching overhead   Baseline flexion 125, abduction 130   Time 4   Period Weeks   Status Partially Met     PT SHORT TERM GOAL #3   Title decrease pain by 50% in due to improved posture and ability to maintain improved posture throughout the day.   Baseline 5% improved   Time 4   Period Weeks   Status On-going            PT Long Term Goals - 05/30/16 0815      PT LONG TERM GOAL #1   Title FOTO < or = to 42%   Baseline 62%   Time 8   Period Weeks   Status On-going     PT LONG TERM GOAL #2   Title independent with advanced HEP as part of maintenance plan   Period Weeks   Status On-going     PT LONG TERM GOAL #3   Title able to drive without increased pain when looking over shoulder or using steering wheel to turn.   Time 8   Period Weeks   Status On-going     PT LONG TERM GOAL #4   Title demonstrate 4+/5 strength of elbow and wrist flexion/extension for improved ability to perform job responsibilities and functional lifting of bags at least 10 lbs.   Time 8   Period Weeks   Status On-going               Plan - 05/30/16 0810    Clinical Impression Statement Pt able to increase weight with wrist flexion and extension, and pronation.  Unable to progress supination was unable to due to pain. Pt has not been consistent with stretching and uses upper traps during exercises needing VC and tactile cues for improved form.  Pt responded well to manual to cervical region   Rehab Potential Excellent   Clinical Impairments Affecting Rehab Potential none   PT Frequency 1x / week   PT Duration 8 weeks   PT Treatment/Interventions Electrical Stimulation;Moist Heat;Ultrasound;Therapeutic activities;Therapeutic exercise;Neuromuscular re-education;Patient/family education;Manual techniques;Taping;Passive range of motion   PT Next Visit Plan mid/low trap strengthening, bicep and tricep strengthening, cont manual and work on thoracic extension as tolerated   PT Home Exercise Plan progress as tolerated, f/u with exercises   Consulted and Agree with Plan of Care Patient      Patient will benefit from skilled therapeutic intervention in order to improve the following deficits and impairments:  Pain, Postural dysfunction, Decreased range of motion, Hypomobility, Impaired UE functional use, Increased muscle  spasms, Impaired flexibility  Visit Diagnosis: Abnormal posture  Muscle weakness (generalized)  Pain in right elbow     Problem List Patient Active Problem List   Diagnosis Date Noted  . Cataract, left eye 10/17/2014  . Foot callus 08/21/2014  . GERD (gastroesophageal reflux disease) 04/15/2014  . Situational anxiety 04/02/2014  . Healthcare maintenance 04/02/2014  . Blurry vision 02/24/2014  . Hypertension 05/16/2012  . Overweight (BMI 25.0-29.9) 05/16/2012  . Enlarged thyroid 05/16/2012  . Hyperlipidemia LDL goal <  100 05/16/2012  . DM type 2 (diabetes mellitus, type 2) ([place]) 05/14/2012    [name], PT 05/30/2016, 12:08 PM  [facility] [address], [place], [place], [address] Phone: [phone]   Fax:  [phone]  Name: Harla Mensch MRN: [id number] Date of Birth: 1956/03/20

## 2016-05-30 NOTE — Patient Instructions (Addendum)
Flexibility: Corner Stretch    Standing in corner with hands just above shoulder level and feet ____ inches from corner, lean forward until a comfortable stretch is felt across chest. Hold __30__ seconds. Repeat __1__ times per set. Do _1___ sets per session. Do _3___ sessions per day.  http://orth.exer.us/343   Copyright  VHI. All rights reserved.  Ear / Shoulder Stretch    Exhaling, move left ear toward left shoulder. Hold position for _5__ breaths. Inhaling, bring head back to center. Repeat to other side. Repeat sequence __1_ times. Do _3__ times per day.  Copyright  VHI. All rights reserved.   [place] [facility] [address] [place], [place] [address] Phone # [phone] Fax [phone]  [name], PT 05/30/16 8:35 AM

## 2016-05-31 ENCOUNTER — Encounter: Payer: BLUE CROSS/BLUE SHIELD | Admitting: Physical Therapy

## 2016-06-06 ENCOUNTER — Encounter: Payer: Self-pay | Admitting: Physical Therapy

## 2016-06-06 ENCOUNTER — Ambulatory Visit: Payer: BLUE CROSS/BLUE SHIELD | Admitting: Physical Therapy

## 2016-06-06 DIAGNOSIS — R293 Abnormal posture: Secondary | ICD-10-CM

## 2016-06-06 DIAGNOSIS — M6281 Muscle weakness (generalized): Secondary | ICD-10-CM

## 2016-06-06 DIAGNOSIS — M25521 Pain in right elbow: Secondary | ICD-10-CM

## 2016-06-06 NOTE — Therapy (Signed)
[facility] [address], [address] [place], , [address] Phone: [phone]   Fax:  [phone]  Physical Therapy Treatment  Patient Details  Name: Kimberly [name] MRN: [id number] Date of Birth: 02/11/1956 Referring Provider: Betty G Jordan, MD  Encounter Date: 06/06/2016      PT End of Session - 06/06/16 1535    Visit Number 6   Date for PT Re-Evaluation 07/01/16   PT Start Time 1527   Activity Tolerance Patient tolerated treatment well   Behavior During Therapy WFL for tasks assessed/performed      Past Medical History:  Diagnosis Date  . Diabetes mellitus 2001  . Hyperlipidemia Dx 2012  . Hypertension 2001    Past Surgical History:  Procedure Laterality Date  . ABDOMINAL HYSTERECTOMY      There were no vitals filed for this visit.      Subjective Assessment - 06/06/16 1535    Subjective Pt states her arm and shoulder is feeling much better.  States she was able to do the exercises on Saturday.   Pertinent History HTN, DM type II   Limitations Lifting;House hold activities;Other (comment)   How long can you sit comfortably? not limited   How long can you stand comfortably? not limited   How long can you walk comfortably? not limited   Patient Stated Goals to get rid of pain, be able to do ADLs without pain   Currently in Pain? No/denies   Multiple Pain Sites No                         [facility] Adult PT Treatment/Exercise - 06/06/16 0001      Shoulder Exercises: Seated   External Rotation Strengthening;Both;Theraband;20 reps   Theraband Level (Shoulder External Rotation) Level 2 (Red)     Shoulder Exercises: Standing   Other Standing Exercises PNF D2 - drawing sword 15x each side   Other Standing Exercises wall slide ROM flexion/ pec stretch - 30x     Shoulder Exercises: ROM/Strengthening   UBE (Upper Arm Bike) 3 min each way Level 2   Other ROM/Strengthening Exercises upper  trap stretch 3 x 20sec both sides     Shoulder Exercises: Power Tower   Extension 10 reps  3 sets, 20lb   Row 10 reps  3 sets; 25#     Shoulder Exercises: Body Blade   Other Body Blade Exercises W's seated 3x10, 25#     Manual Therapy   Manual Therapy Soft tissue mobilization   Manual therapy comments supine   Soft tissue mobilization cervical paraspinals, upper traps Rt, suboccipitals                  PT Short Term Goals - 06/06/16 1539      PT SHORT TERM GOAL #2   Title increased shoulder AROM to Rt shoulder flex 160 deg, abduction 130 deg in order to improve functional reaching overhead   Baseline flexion 136, abduction 110 degrees   Time 4   Period Weeks   Status Partially Met     PT SHORT TERM GOAL #3   Title decrease pain by 50% in due to improved posture and ability to maintain improved posture throughout the day.   Baseline at least 50% improved   Time 4   Period Weeks   Status Achieved           PT Long Term Goals - 06/06/16 1540        PT LONG TERM GOAL #2   Title independent with advanced HEP as part of maintenance plan   Period Weeks   Status On-going     PT LONG TERM GOAL #4   Title demonstrate 4+/5 strength of elbow and wrist flexion/extension for improved ability to perform job responsibilities and functional lifting of bags at least 10 lbs.   Time 8   Period Weeks   Status Achieved               Plan - 06/06/16 1611    Clinical Impression Statement Pt was able to tolerate more advanced exericses including power tower and did not have pain or fatigue with UBE.  Pt states she has been feeling much better over the past two days.  Her AROM imroved in flexion today, but abduction was more painful when reaching overhead so she only partially met goal for AROM.  Pt would cont to benefit from skilled PT for improved UE strength for improved functional reaching without pain.   Rehab Potential Excellent   Clinical Impairments Affecting  Rehab Potential none   PT Frequency 1x / week   PT Duration 8 weeks   PT Treatment/Interventions Electrical Stimulation;Moist Heat;Ultrasound;Therapeutic activities;Therapeutic exercise;Neuromuscular re-education;Patient/family education;Manual techniques;Taping;Passive range of motion   PT Next Visit Plan discuss discharge and finalize HEP   PT Home Exercise Plan progress as tolerated, f/u with exercises   Consulted and Agree with Plan of Care Patient      Patient will benefit from skilled therapeutic intervention in order to improve the following deficits and impairments:  Pain, Postural dysfunction, Decreased range of motion, Hypomobility, Impaired UE functional use, Increased muscle spasms, Impaired flexibility  Visit Diagnosis: Abnormal posture  Muscle weakness (generalized)  Pain in right elbow     Problem List Patient Active Problem List   Diagnosis Date Noted  . Cataract, left eye 10/17/2014  . Foot callus 08/21/2014  . GERD (gastroesophageal reflux disease) 04/15/2014  . Situational anxiety 04/02/2014  . Healthcare maintenance 04/02/2014  . Blurry vision 02/24/2014  . Hypertension 05/16/2012  . Overweight (BMI 25.0-29.9) 05/16/2012  . Enlarged thyroid 05/16/2012  . Hyperlipidemia LDL goal < 100 05/16/2012  . DM type 2 (diabetes mellitus, type 2) ([place]) 05/14/2012    [name], PT 06/06/2016, 5:23 PM  [facility] [address] [place], [place], [address] Phone: [phone]   Fax:  [phone]  Name: [name] MRN: [id number] Date of Birth: 26-Jan-1956

## 2016-06-07 ENCOUNTER — Encounter: Payer: BLUE CROSS/BLUE SHIELD | Admitting: Physical Therapy

## 2016-06-14 ENCOUNTER — Encounter: Payer: BLUE CROSS/BLUE SHIELD | Admitting: Physical Therapy

## 2016-06-15 ENCOUNTER — Ambulatory Visit: Payer: BLUE CROSS/BLUE SHIELD | Admitting: Physical Therapy

## 2016-06-15 ENCOUNTER — Encounter: Payer: Self-pay | Admitting: Physical Therapy

## 2016-06-15 DIAGNOSIS — R293 Abnormal posture: Secondary | ICD-10-CM

## 2016-06-15 DIAGNOSIS — M25521 Pain in right elbow: Secondary | ICD-10-CM

## 2016-06-15 DIAGNOSIS — M6281 Muscle weakness (generalized): Secondary | ICD-10-CM

## 2016-06-15 NOTE — Therapy (Signed)
[facility] [address] [place], [place], [address] Phone: [phone]   Fax:  [phone]  Physical Therapy Treatment  Patient Details  Name: Kimberly [name] MRN: [id number] Date of Birth: [birth year] Referring Provider: Betty G Martinique, MD  Encounter Date: 06/15/2016      PT End of Session - 06/15/16 1529    Visit Number 7   Date for PT Re-Evaluation 07/01/16   PT Start Time 1527   PT Stop Time 1615   PT Time Calculation (min) 48 min   Activity Tolerance Patient tolerated treatment well   Behavior During Therapy [facility] for tasks assessed/performed      Past Medical History:  Diagnosis Date  . Diabetes mellitus 2001  . Hyperlipidemia Dx 2012  . Hypertension 2001    Past Surgical History:  Procedure Laterality Date  . ABDOMINAL HYSTERECTOMY      There were no vitals filed for this visit.      Subjective Assessment - 06/15/16 1528    Subjective Pt reports shoulder and hand doing well. A little pain in low back.   Pertinent History HTN, DM type II   Limitations Lifting;House hold activities;Other (comment)   How long can you sit comfortably? not limited   How long can you stand comfortably? not limited   How long can you walk comfortably? not limited   Patient Stated Goals to get rid of pain, be able to do ADLs without pain   Currently in Pain? No/denies                         [facility] Adult PT Treatment/Exercise - 06/15/16 0001      Elbow Exercises   Elbow Extension Strengthening;Right;20 reps   Theraband Level (Elbow Extension) Level 2 (Red)     Shoulder Exercises: Prone   Extension Strengthening  #2   Horizontal ABduction 1 Strengthening;Right;10 reps   Other Prone Exercises Elbow extension  2x10 #2     Shoulder Exercises: Standing   Flexion AAROM  cane   Other Standing Exercises --     Shoulder Exercises: ROM/Strengthening   UBE (Upper Arm Bike) 3 min each way Level 2   Therapist present to discuss treatment     Shoulder Exercises: Power Development worker, community 10 reps  3 sets, [employer] 10 reps  #20   External Rotation 10 reps  2x10 red band   Internal Rotation 10 reps   Other Power [employer] Exercises --   Other Power Tower Exercises Retraction  3x10 red band     Ultrasound   Ultrasound Location Rt lateral epicondyle   Ultrasound Parameters 3 Mhz 20% 1.3   Ultrasound Goals Pain     Manual Therapy   Manual Therapy --   Manual therapy comments --   Soft tissue mobilization --                  PT Short Term Goals - 06/15/16 1530      PT SHORT TERM GOAL #2   Title increased shoulder AROM to Rt shoulder flex 160 deg, abduction 130 deg in order to improve functional reaching overhead   Baseline flexion 136, abduction 110 degrees   Time 4   Period Weeks   Status Partially Met     PT SHORT TERM GOAL #3   Title decrease pain by 50% in due to improved posture and ability to maintain improved posture throughout the day.  Baseline at least 50% improved   Time 4   Period Weeks   Status Achieved           PT Long Term Goals - 06/15/16 1530      PT LONG TERM GOAL #1   Title FOTO < or = to 42%   Baseline 62%   Time 8   Period Weeks   Status On-going     PT LONG TERM GOAL #2   Title independent with advanced HEP as part of maintenance plan   Time 8   Period Weeks   Status On-going     PT LONG TERM GOAL #3   Title able to drive without increased pain when looking over shoulder or using steering wheel to turn.   Time 8   Period Weeks   Status On-going     PT LONG TERM GOAL #4   Title demonstrate 4+/5 strength of elbow and wrist flexion/extension for improved ability to perform job responsibilities and functional lifting of bags at least 10 lbs.   Time 8   Period Weeks   Status Achieved               Plan - 06/15/16 1556    Clinical Impression Statement Pt continues to progress with UE strength and ROM. Able to  tolerate all exercsies well with some fatigue. Pt will continue to benefit from skilled therapy for UE strength and stability.    Rehab Potential Excellent   Clinical Impairments Affecting Rehab Potential none   PT Frequency 1x / week   PT Duration 8 weeks   PT Treatment/Interventions Electrical Stimulation;Moist Heat;Ultrasound;Therapeutic activities;Therapeutic exercise;Neuromuscular re-education;Patient/family education;Manual techniques;Taping;Passive range of motion      Patient will benefit from skilled therapeutic intervention in order to improve the following deficits and impairments:  Pain, Postural dysfunction, Decreased range of motion, Hypomobility, Impaired UE functional use, Increased muscle spasms, Impaired flexibility  Visit Diagnosis: Abnormal posture  Muscle weakness (generalized)  Pain in right elbow     Problem List Patient Active Problem List   Diagnosis Date Noted  . Cataract, left eye 10/17/2014  . Foot callus 08/21/2014  . GERD (gastroesophageal reflux disease) 04/15/2014  . Situational anxiety 04/02/2014  . Healthcare maintenance 04/02/2014  . Blurry vision 02/24/2014  . Hypertension 05/16/2012  . Overweight (BMI 25.0-29.9) 05/16/2012  . Enlarged thyroid 05/16/2012  . Hyperlipidemia LDL goal < 100 05/16/2012  . DM type 2 (diabetes mellitus, type 2) ([place]) 05/14/2012    Mikle Bosworth PTA 06/15/2016, 4:25 PM  [facility] [address], [place], [place], [address] Phone: [phone]   Fax:  [phone]  Name: Kimberly [name] MRN: [id number] Date of Birth: February 16, 1956

## 2016-06-16 ENCOUNTER — Encounter: Payer: Self-pay | Admitting: Family Medicine

## 2016-06-16 ENCOUNTER — Ambulatory Visit (INDEPENDENT_AMBULATORY_CARE_PROVIDER_SITE_OTHER): Payer: BLUE CROSS/BLUE SHIELD | Admitting: Family Medicine

## 2016-06-16 VITALS — BP 122/78 | HR 67 | Resp 12 | Ht 71.0 in | Wt 199.4 lb

## 2016-06-16 DIAGNOSIS — R740 Nonspecific elevation of levels of transaminase and lactic acid dehydrogenase [LDH]: Secondary | ICD-10-CM | POA: Diagnosis not present

## 2016-06-16 DIAGNOSIS — K219 Gastro-esophageal reflux disease without esophagitis: Secondary | ICD-10-CM | POA: Diagnosis not present

## 2016-06-16 DIAGNOSIS — R600 Localized edema: Secondary | ICD-10-CM

## 2016-06-16 DIAGNOSIS — E785 Hyperlipidemia, unspecified: Secondary | ICD-10-CM

## 2016-06-16 DIAGNOSIS — I10 Essential (primary) hypertension: Secondary | ICD-10-CM | POA: Diagnosis not present

## 2016-06-16 DIAGNOSIS — R7401 Elevation of levels of liver transaminase levels: Secondary | ICD-10-CM

## 2016-06-16 DIAGNOSIS — R894 Abnormal immunological findings in specimens from other organs, systems and tissues: Secondary | ICD-10-CM

## 2016-06-16 MED ORDER — HYDROCHLOROTHIAZIDE 25 MG PO TABS: ORAL_TABLET | ORAL | 1 refills | Status: DC

## 2016-06-16 MED ORDER — OMEPRAZOLE 20 MG PO CPDR: 20.0000 mg | DELAYED_RELEASE_CAPSULE | Freq: Every day | ORAL | 1 refills | Status: DC

## 2016-06-16 NOTE — Progress Notes (Signed)
HPI:   [name] is a 60 y.o. female, who is here today to follow on last OV, 04/25/16. She has other concerns today.  Last OV she was Dx with epicondylitis of right elbow, symptoms greatly improved with PT, she has one more section. She has no further concerns in this regard.  GERD: She is currently on omeprazole 40 mg daily, which was recommended because complaining of persistent cough. She states that cough  "kind of subside" , she had some about 2 days ago but attributed to a cold.  Denies abdominal pain, nausea, vomiting, changes in bowel habits, blood in stool or melena.   She recently followed with Dr. Katheran Awe for her DM II, she brings lab results and would like for me to go through results with her.   Hyperlipidemia:  Currently on Zocor 20 mg daily Following a low fat diet: Not consistently.  She has not noted side effects with medication.  Lab Results  Component Value Date   CHOL 184 05/18/2016   HDL 45 05/18/2016   LDLCALC 103 05/18/2016   LDLDIRECT 153 (H) 05/14/2012   TRIG 180 (A) 05/18/2016   CHOLHDL 4.9 07/27/2013     Hypertension:  Currently on HCTZ  25 mg daily. Discontinued Cozaar because thought it may also be causing cough.  She has not noted unusual headache, visual changes, exertional chest pain, dyspnea,  focal weakness, or worsening edema. She mentions long-time history of ankle edema, bilateral, more noticeable at the end of the day. She denies calves pain or erythema.   Lab Results  Component Value Date   CREATININE 0.9 05/18/2016   BUN 12 05/18/2016   NA 139 05/18/2016   K 4.2 05/18/2016   CL 104 07/27/2013   CO2 27 07/27/2013      Concerns today: She also follows with her gynecologist recently, she has some labs done and concern about some of the results Herpes type I-II positive Ab. She denies history of genital herpes or vesicular genital lesion and she wonders if she needs treatment at this time. She states that  test was done because she requested.  -She is also following a diet, "[place]", that recommends 3 servings of grape juice daily as well as bacon and any portion size of meat. She wants to know if she should be continuing his diet for weight loss.  -History of elevated transaminases, recently she had hepatitis B and hepatitis C screening test, both negative. She is not sure if she received hepatitis B vaccine in the past.    Review of Systems  Constitutional: Negative for activity change, fatigue, fever and unexpected weight change.  HENT: Negative for mouth sores, nosebleeds and trouble swallowing.   Eyes: Negative for pain and visual disturbance.  Respiratory: Negative for cough, shortness of breath and wheezing.   Cardiovascular: Negative for chest pain, palpitations and leg swelling.  Gastrointestinal: Negative for abdominal pain, nausea and vomiting.       Negative for changes in bowel habits.  Genitourinary: Negative for decreased urine volume, difficulty urinating, dysuria, genital sores, hematuria, vaginal bleeding and vaginal discharge.  Skin: Negative for rash.  Neurological: Negative for syncope, weakness and headaches.  Psychiatric/Behavioral: Negative for confusion. The patient is nervous/anxious.       Current Outpatient Prescriptions on File Prior to Visit  Medication Sig Dispense Refill  . acetaminophen (TYLENOL) 500 MG tablet Take 1 tablet (500 mg total) by mouth every 6 (six) hours as needed. [place]  tablet 0  . aspirin EC 81 MG tablet Take 1 tablet (81 mg total) by mouth daily.    . Biotin 1000 MCG tablet Take 1,000 mcg by mouth daily.    . Blood Glucose Monitoring Suppl (BLOOD GLUCOSE METER) kit Use as instructed 1 each 0  . budesonide-formoterol (SYMBICORT) 160-4.5 MCG/ACT inhaler Inhale 2 puffs into the lungs 2 (two) times daily. 1 Inhaler 3  . glyBURIDE (DIABETA) 5 MG tablet TAKE 1 TABLET (5 MG TOTAL) BY MOUTH 2 (TWO) TIMES DAILY WITH A MEAL. 30 tablet 2  .  insulin glargine (LANTUS) 100 UNIT/ML injection Inject 0.65 mLs (65 Units total) into the skin at bedtime. 10 mL 11  . metFORMIN (GLUCOPHAGE) 1000 MG tablet TAKE 1 TABLET (1,000 MG TOTAL) BY MOUTH 2 (TWO) TIMES DAILY WITH A MEAL. 60 tablet 3  . simvastatin (ZOCOR) 20 MG tablet Take 1 tablet (20 mg total) by mouth at bedtime. 30 tablet 3  . TRUEPLUS LANCETS 28G MISC 1 each by Does not apply route 3 (three) times daily. 100 each 11  . TRUETEST TEST test strip USE AS DIRECTED BY MD 50 each 12   No current facility-administered medications on file prior to visit.      Past Medical History:  Diagnosis Date  . Diabetes mellitus 2001  . Hyperlipidemia Dx 2012  . Hypertension 2001   Allergies  Allergen Reactions  . Ace Inhibitors Cough    Social History   Social History  . Marital status: Divorced    Spouse name: N/A  . Number of children: N/A  . Years of education: N/A   Social History Main Topics  . Smoking status: Never Smoker  . Smokeless tobacco: None  . Alcohol use Yes     Comment: occ  . Drug use: No  . Sexual activity: Not Asked   Other Topics Concern  . None   Social History Narrative  . None    Vitals:   06/16/16 0802  BP: 122/78  Pulse: 67  Resp: 12   Body mass index is 27.81 kg/m.    Physical Exam  Nursing note and vitals reviewed. Constitutional: She is oriented to person, place, and time. She appears well-developed and well-nourished. No distress.  HENT:  Head: Atraumatic.  Mouth/Throat: Oropharynx is clear and moist and mucous membranes are normal.  Eyes: Conjunctivae and EOM are normal. Pupils are equal, round, and reactive to light.  Neck: Thyromegaly (mild) present.  Cardiovascular: Normal rate and regular rhythm.   No murmur heard. Pulses:      Dorsalis pedis pulses are 2+ on the right side, and 2+ on the left side.  Respiratory: Effort normal and breath sounds normal. No respiratory distress.  GI: Soft. She exhibits no mass. There is no  hepatomegaly. There is no tenderness.  Musculoskeletal: She exhibits edema (mild bilateral periankle pain and trace pitting LE edema). She exhibits no tenderness.  Lymphadenopathy:    She has no cervical adenopathy.  Neurological: She is alert and oriented to person, place, and time. She has normal strength. Coordination and gait normal.  Skin: Skin is warm. No rash noted. No erythema.  Psychiatric: Her mood appears anxious.  Well groomed, poor eye contact.      ASSESSMENT AND PLAN:     Kimberly [name] was seen today for follow-up.  Diagnoses and all orders for this visit:    Gastroesophageal reflux disease, esophagitis presence not specified  Improved. She agrees with trying decreasing dose of Omeprazole from 40 mg to  20 mg daily. Some side effects of PPIs were discussed. GERD precautions to continue. Follow-up in 4 months.  -     omeprazole (PRILOSEC) 20 MG capsule; Take 1 capsule (20 mg total) by mouth daily.  Elevated transaminase level  Possible causes discussed, including fatty liver. With next lab work I am considering adding HBsAb to determine immunity state, OR next OV  we could go a head and given hep A and B vaccines. We'll continue following.  Essential hypertension  Adequately controlled. No changes in current management. DASH diet recommended. Eye exam recommended annually. F/U in 4 months, before if needed.  -     hydrochlorothiazide (HYDRODIURIL) 25 MG tablet; TAKE 1 TABLET (25 MG TOTAL) BY MOUTH DAILY.  Hyperlipidemia, unspecified hyperlipidemia type  Improved. No changes in current management. Low fat diet discussed, so she needs to stop current diet (bacon and meat)  Bilateral lower extremity edema  Mild. We discussed possible causes: Venous disease, medications among some. Lower extremity elevation above heart level may help. Follow up as needed.   Herpes simplex antibody positive  We had a long discussion about this. No Hx of HSV  infection, labs were done upon her request (as reported by pt).    Face to face 8:05 am to 8:47, > 50% of OV was dedicated to discussion of lab results including herpes vitals immunity status. Explained that exposure my has happened many years ago, since she doesn't have any medication and has not had ideation before treatment is not recommended. Very anxious, needed a lot of reassuring. Also sometimes dedicated to some of the side effects of her medications, emphasis on PPIs since she was also concerned about renal side effects. We also had a discussion about dietary recommendations, explained that grapefruit juice can interfere with some of her medications as well as to increase BS;so It is not recommended.     -Kimberly [name] was advised to return sooner than planned today if new concerns arise.       Betty G. Martinique, MD  [facility]. [place] office.

## 2016-06-16 NOTE — Progress Notes (Signed)
Pre visit review using our clinic review tool, if applicable. No additional management support is needed unless otherwise documented below in the visit note. 

## 2016-06-16 NOTE — Patient Instructions (Addendum)
A few things to remember from today's visit:   Essential hypertension  Gastroesophageal reflux disease, esophagitis presence not specified - Plan: omeprazole (PRILOSEC) 20 MG capsule  Hyperlipidemia, unspecified hyperlipidemia type  Elevated transaminase level   Please be sure medication list is accurate. If a new problem present, please set up appointment sooner than planned today.

## 2016-06-23 ENCOUNTER — Encounter: Payer: Self-pay | Admitting: Family Medicine

## 2016-06-23 ENCOUNTER — Telehealth: Payer: Self-pay | Admitting: Family Medicine

## 2016-06-23 NOTE — Telephone Encounter (Signed)
Noted  

## 2016-06-23 NOTE — Telephone Encounter (Signed)
[facility] - Client [place] [facility]  Patient Name: Kimberly [name]  DOB: July 08, 1955    Initial Comment Caller states she has cold, coughing.    Nurse Assessment  Nurse: Wynetta Emery, RN, Baker Janus Date/Time Eilene Ghazi Time): 06/23/2016 10:57:06 AM  Confirm and document reason for call. If symptomatic, describe symptoms. ---Lesleigh Noe has cold - runny nose, cough and congestion onset yesterday fever and body aches  Does the patient have any new or worsening symptoms? ---Yes  Will a triage be completed? ---Yes  Related visit to physician within the last 2 weeks? ---No  Does the PT have any chronic conditions? (i.e. diabetes, asthma, etc.) ---No  Is this a behavioral health or substance abuse call? ---No     Guidelines    Guideline Title Affirmed Question Affirmed Notes  Cough - Acute Non-Productive [1] Fever > 100.5 F (38.1 C) AND [2] diabetes mellitus or weak immune system (e.g., HIV positive, cancer chemo, splenectomy, organ transplant, chronic steroids)    Final Disposition User   See Physician within 4 Hours (or PCP triage) Wynetta Emery, RN, Percell Miller wanting to see MD for cough congestion fever coughing all night; no availability today with any provider; has opening at 115pm arrival time 130 pm with Dr. Blima Singer for cough and congestion fever tomorrow appt given   Referrals  REFERRED TO PCP OFFICE   Disagree/Comply: Comply

## 2016-06-24 ENCOUNTER — Ambulatory Visit (INDEPENDENT_AMBULATORY_CARE_PROVIDER_SITE_OTHER): Payer: BLUE CROSS/BLUE SHIELD | Admitting: Family Medicine

## 2016-06-24 ENCOUNTER — Telehealth: Payer: Self-pay | Admitting: Family Medicine

## 2016-06-24 ENCOUNTER — Encounter: Payer: Self-pay | Admitting: Family Medicine

## 2016-06-24 VITALS — BP 124/62 | HR 96 | Temp 97.5°F | Ht 71.0 in | Wt 196.1 lb

## 2016-06-24 DIAGNOSIS — J069 Acute upper respiratory infection, unspecified: Secondary | ICD-10-CM | POA: Diagnosis not present

## 2016-06-24 LAB — POC INFLUENZA A&B (BINAX/QUICKVUE)
Influenza A, POC: NEGATIVE
Influenza B, POC: NEGATIVE

## 2016-06-24 MED ORDER — BENZONATATE 100 MG PO CAPS: 100.0000 mg | ORAL_CAPSULE | Freq: Three times a day (TID) | ORAL | 0 refills | Status: DC | PRN

## 2016-06-24 NOTE — Telephone Encounter (Signed)
Pt state that she is at the pharmacy ([facility] at [facility]) waiting on the cough syrup that Dr. Maudie Mercury state that would be there when she got there and it is not pt state that she do not want to go back out and will always ask for a paper copy.  (pt was very nasty)

## 2016-06-24 NOTE — Progress Notes (Signed)
Pre visit review using our clinic review tool, if applicable. No additional management support is needed unless otherwise documented below in the visit note. 

## 2016-06-24 NOTE — Addendum Note (Signed)
Addended by: Lucretia Kern on: 06/24/2016 03:37 PM   Modules accepted: Orders

## 2016-06-24 NOTE — Progress Notes (Signed)
HPI:  Kimberly [name] rose pleasant 60 year old here for an acute visit for:  Upper resp symptoms: -Started 3 days ago, everyone in her family has had the same symptoms -Symptoms include nasal congestion, sore throat, postnasal drip, cough, subjective fevers initially -no documented fevers> 100, vomiting, shortness of breath, nausea, sinus pain, thick white mucus production or significant body aches -She has a history of uncontrolled diabetes, however she reports her blood sugars recently including today have been better with a blood glucose around 90 today.   ROS: See pertinent positives and negatives per HPI.  Past Medical History:  Diagnosis Date  . Diabetes mellitus 2001  . Hyperlipidemia Dx 2012  . Hypertension 2001    Past Surgical History:  Procedure Laterality Date  . ABDOMINAL HYSTERECTOMY      Family History  Problem Relation Age of Onset  . Depression Mother   . Hypertension Mother   . Heart disease Father   . Cancer Sister   . Cancer Brother     Social History   Social History  . Marital status: Divorced    Spouse name: N/A  . Number of children: N/A  . Years of education: N/A   Social History Main Topics  . Smoking status: Never Smoker  . Smokeless tobacco: None  . Alcohol use Yes     Comment: occ  . Drug use: No  . Sexual activity: Not Asked   Other Topics Concern  . None   Social History Narrative  . None     Current Outpatient Prescriptions:  .  acetaminophen (TYLENOL) 500 MG tablet, Take 1 tablet (500 mg total) by mouth every 6 (six) hours as needed., Disp: 30 tablet, Rfl: 0 .  aspirin EC 81 MG tablet, Take 1 tablet (81 mg total) by mouth daily., Disp: , Rfl:  .  Biotin 1000 MCG tablet, Take 1,000 mcg by mouth daily., Disp: , Rfl:  .  Blood Glucose Monitoring Suppl (BLOOD GLUCOSE METER) kit, Use as instructed, Disp: 1 each, Rfl: 0 .  budesonide-formoterol (SYMBICORT) 160-4.5 MCG/ACT inhaler, Inhale 2 puffs into the lungs 2 (two)  times daily., Disp: 1 Inhaler, Rfl: 3 .  glyBURIDE (DIABETA) 5 MG tablet, TAKE 1 TABLET (5 MG TOTAL) BY MOUTH 2 (TWO) TIMES DAILY WITH A MEAL., Disp: 30 tablet, Rfl: 2 .  hydrochlorothiazide (HYDRODIURIL) 25 MG tablet, TAKE 1 TABLET (25 MG TOTAL) BY MOUTH DAILY., Disp: 90 tablet, Rfl: 1 .  insulin glargine (LANTUS) 100 UNIT/ML injection, Inject 0.65 mLs (65 Units total) into the skin at bedtime., Disp: 10 mL, Rfl: 11 .  metFORMIN (GLUCOPHAGE) 1000 MG tablet, TAKE 1 TABLET (1,000 MG TOTAL) BY MOUTH 2 (TWO) TIMES DAILY WITH A MEAL., Disp: 60 tablet, Rfl: 3 .  omeprazole (PRILOSEC) 20 MG capsule, Take 1 capsule (20 mg total) by mouth daily., Disp: 90 capsule, Rfl: 1 .  simvastatin (ZOCOR) 20 MG tablet, Take 1 tablet (20 mg total) by mouth at bedtime., Disp: 30 tablet, Rfl: 3 .  TRUEPLUS LANCETS 28G MISC, 1 each by Does not apply route 3 (three) times daily., Disp: 100 each, Rfl: 11 .  TRUETEST TEST test strip, USE AS DIRECTED BY MD, Disp: 50 each, Rfl: 12  EXAM:  Vitals:   06/24/16 1341  BP: 124/62  Pulse: 96  Temp: 97.5 F (36.4 C)    Body mass index is 27.35 kg/m.  GENERAL: vitals reviewed and listed above, alert, oriented, appears well hydrated and in no acute distress  HEENT: atraumatic, conjunttiva  clear, no obvious abnormalities on inspection of external nose and ears, normal appearance of ear canals and TMs, clear nasal congestion, mild post oropharyngeal erythema with PND, no tonsillar edema or exudate, no sinus TTP  NECK: no obvious masses on inspection  LUNGS: clear to auscultation bilaterally, no wheezes, rales or rhonchi, good air movement  CV: HRRR, no peripheral edema  MS: moves all extremities without noticeable abnormality  PSYCH: pleasant and cooperative, no obvious depression or anxiety  ASSESSMENT AND PLAN:  Discussed the following assessment and plan:  Acute upper respiratory infection  -we discussed possible serious and likely etiologies, workup and  treatment, treatment risks and return precautions - history and exam suggestive viral upper respiratory infection that seems to be improving. Did discuss the possibility of flu, limitations of testing and treatment. At this point benefits of Tamiflu are less likely useful, and she opted not to take it flu test positive. -Given recently uncontrolled diabetes, did advise prompt follow-up if any worsening or if symptoms do not continue to improve over the next few days, as she is at higher risk for developing a secondary bacterial infection. There are no signs of a bacterial infection on exam today. Cough medication prescribed. Symptomatic care discussed. Return and emergency/urgency care precautions discussed. -of course, we advised Kaile  to return or notify a doctor immediately if symptoms worsen or persist or new concerns arise.    Patient Instructions  BEFORE YOU LEAVE: -flu test  Seek care promptly if worsening or not improving over the next few days.   INSTRUCTIONS FOR UPPER RESPIRATORY INFECTION:  -plenty of rest and fluids  -nasal saline wash 2-3 times daily (use prepackaged nasal saline or bottled/distilled water if making your own)   -can use tylenol (in no history of liver disease)  as directed for aches and sorethroat  -in the winter time, using a humidifier at night is helpful (please follow cleaning instructions)  -if you are taking a cough medication - use only as directed, may also try a teaspoon of honey to coat the throat and throat lozenges. I sent tessalon to your pharmacy to use for the cough.  -for sore throat, salt water gargles can help  -follow up if you have fevers, facial pain, tooth pain, difficulty breathing or are worsening or symptoms persist longer then expected  Upper Respiratory Infection, Adult An upper respiratory infection (URI) is also known as the common cold. It is often caused by a type of germ (virus). Colds are easily spread (contagious). You  can pass it to others by kissing, coughing, sneezing, or drinking out of the same glass. Usually, you get better in 1 to 3  weeks.  However, the cough can last for even longer. HOME CARE   Only take medicine as told by your doctor. Follow instructions provided above.  Drink enough water and fluids to keep your pee (urine) clear or pale yellow.  Get plenty of rest.  Return to work when your temperature is < 100 for 24 hours or as told by your doctor. You may use a face mask and wash your hands to stop your cold from spreading. GET HELP RIGHT AWAY IF:   After the first few days, you feel you are getting worse.  You have questions about your medicine.  You have chills, shortness of breath, or red spit (mucus).  You have pain in the face for more then 1-2 days, especially when you bend forward.  You have a fever, puffy (swollen) neck, pain  when you swallow, or white spots in the back of your throat.  You have a bad headache, ear pain, sinus pain, or chest pain.  You have a high-pitched whistling sound when you breathe in and out (wheezing).  You cough up blood.  You have sore muscles or a stiff neck. MAKE SURE YOU:   Understand these instructions.  Will watch your condition.  Will get help right away if you are not doing well or get worse. Document Released: 11/30/2007 Document Revised: 09/05/2011 Document Reviewed: 09/18/2013 [facility] Patient Information 2015 [place], [place]. This information is not intended to replace advice given to you by your health care provider. Make sure you discuss any questions you have with your health care provider.       Colin Benton R., DO

## 2016-06-24 NOTE — Patient Instructions (Signed)
BEFORE YOU LEAVE: -flu test  Seek care promptly if worsening or not improving over the next few days.   INSTRUCTIONS FOR UPPER RESPIRATORY INFECTION:  -plenty of rest and fluids  -nasal saline wash 2-3 times daily (use prepackaged nasal saline or bottled/distilled water if making your own)   -can use tylenol (in no history of liver disease)  as directed for aches and sorethroat  -in the winter time, using a humidifier at night is helpful (please follow cleaning instructions)  -if you are taking a cough medication - use only as directed, may also try a teaspoon of honey to coat the throat and throat lozenges. I sent tessalon to your pharmacy to use for the cough.  -for sore throat, salt water gargles can help  -follow up if you have fevers, facial pain, tooth pain, difficulty breathing or are worsening or symptoms persist longer then expected  Upper Respiratory Infection, Adult An upper respiratory infection (URI) is also known as the common cold. It is often caused by a type of germ (virus). Colds are easily spread (contagious). You can pass it to others by kissing, coughing, sneezing, or drinking out of the same glass. Usually, you get better in 1 to 3  weeks.  However, the cough can last for even longer. HOME CARE   Only take medicine as told by your doctor. Follow instructions provided above.  Drink enough water and fluids to keep your pee (urine) clear or pale yellow.  Get plenty of rest.  Return to work when your temperature is < 100 for 24 hours or as told by your doctor. You may use a face mask and wash your hands to stop your cold from spreading. GET HELP RIGHT AWAY IF:   After the first few days, you feel you are getting worse.  You have questions about your medicine.  You have chills, shortness of breath, or red spit (mucus).  You have pain in the face for more then 1-2 days, especially when you bend forward.  You have a fever, puffy (swollen) neck, pain when you  swallow, or white spots in the back of your throat.  You have a bad headache, ear pain, sinus pain, or chest pain.  You have a high-pitched whistling sound when you breathe in and out (wheezing).  You cough up blood.  You have sore muscles or a stiff neck. MAKE SURE YOU:   Understand these instructions.  Will watch your condition.  Will get help right away if you are not doing well or get worse. Document Released: 11/30/2007 Document Revised: 09/05/2011 Document Reviewed: 09/18/2013 [facility] Patient Information 2015 [place], [place]. This information is not intended to replace advice given to you by your health care provider. Make sure you discuss any questions you have with your health care provider.

## 2016-06-24 NOTE — Telephone Encounter (Signed)
rx sent to pharmacy

## 2016-06-24 NOTE — Addendum Note (Signed)
Addended by: Agnes Lawrence on: 06/24/2016 02:14 PM   Modules accepted: Orders

## 2016-06-30 ENCOUNTER — Ambulatory Visit: Payer: BLUE CROSS/BLUE SHIELD | Attending: Family Medicine | Admitting: Physical Therapy

## 2016-06-30 ENCOUNTER — Encounter: Payer: Self-pay | Admitting: Physical Therapy

## 2016-06-30 DIAGNOSIS — R293 Abnormal posture: Secondary | ICD-10-CM | POA: Insufficient documentation

## 2016-06-30 DIAGNOSIS — M6281 Muscle weakness (generalized): Secondary | ICD-10-CM | POA: Insufficient documentation

## 2016-06-30 DIAGNOSIS — M25521 Pain in right elbow: Secondary | ICD-10-CM | POA: Insufficient documentation

## 2016-06-30 NOTE — Therapy (Signed)
[facility] [address] [name], [place], [address] Phone: [phone]   Fax:  [phone]  Physical Therapy Treatment  Patient Details  Name: Kimberly [name] MRN: [id number] Date of Birth: 1955/12/30 Referring Provider: Betty G Martinique, MD  Encounter Date: 06/30/2016      PT End of Session - 06/30/16 1158    Visit Number 8   Date for PT Re-Evaluation 07/01/16   PT Start Time 1157   PT Stop Time 0973   PT Time Calculation (min) 38 min   Activity Tolerance Patient tolerated treatment well   Behavior During Therapy [facility] for tasks assessed/performed      Past Medical History:  Diagnosis Date  . Diabetes mellitus 2001  . Hyperlipidemia Dx 2012  . Hypertension 2001    Past Surgical History:  Procedure Laterality Date  . ABDOMINAL HYSTERECTOMY      There were no vitals filed for this visit.      Subjective Assessment - 06/30/16 1203    Subjective Patient has not had pain today and no flare ups or pain during daily activities since last visit.  Pt feels ready to discharge today.   Pertinent History HTN, DM type II   Limitations Lifting;House hold activities;Other (comment)   How long can you sit comfortably? not limited   How long can you stand comfortably? not limited   How long can you walk comfortably? not limited   Patient Stated Goals Pt states she has achieved her goals at this time   Currently in Pain? No/denies                         [facility] Adult PT Treatment/Exercise - 06/30/16 0001      Shoulder Exercises: ROM/Strengthening   UBE (Upper Arm Bike) 3 min each way Level 2  Therapist present to discuss treatment     Shoulder Exercises: Power Development worker, community 10 reps  3 sets, [employer] 10 reps  #20 x 3 sets, standing   External Rotation 10 reps  2x10 red band   Internal Rotation 10 reps   Other Power Investment banker, operational - 25# 3x10  seated   Other Power Tower Exercises  lat pull down 35# 3x10     Manual Therapy   Manual Therapy Soft tissue mobilization   Manual therapy comments supine   Soft tissue mobilization cervical paraspinals, upper traps Rt, suboccipitals                  PT Short Term Goals - 06/30/16 1201      PT SHORT TERM GOAL #1   Title independent with initial HEP   Time 4   Period Weeks   Status Achieved     PT SHORT TERM GOAL #2   Title increased shoulder AROM to Rt shoulder flex 160 deg, abduction 130 deg in order to improve functional reaching overhead   Baseline flexion 135, abduction 130   Time 4   Period Weeks   Status Partially Met     PT SHORT TERM GOAL #3   Title decrease pain by 50% in due to improved posture and ability to maintain improved posture throughout the day.   Baseline at least 50% improved   Time 4   Period Weeks   Status Achieved           PT Long Term Goals - 06/30/16 1253      PT  LONG TERM GOAL #1   Title FOTO < or = to 42%   Baseline 31%   Time 8   Period Weeks   Status Achieved     PT LONG TERM GOAL #2   Title independent with advanced HEP as part of maintenance plan   Time 8   Period Weeks   Status Achieved     PT LONG TERM GOAL #3   Title able to drive without increased pain when looking over shoulder or using steering wheel to turn.   Period Weeks   Status Achieved     PT LONG TERM GOAL #4   Title demonstrate 4+/5 strength of elbow and wrist flexion/extension for improved ability to perform job responsibilities and functional lifting of bags at least 10 lbs.   Time 8   Period Weeks   Status Achieved               Plan - 06/30/16 1250    Clinical Impression Statement Pt improved with FOTO score better than anticipated adn only 31% limited.  Only one goal is partially met at this time due to slightly less shoulder flexion AROM than anticipated, but all other goals achieved.  Pt demonstrates improved ROM without pain and she is independent with her final HEP.     Rehab Potential Excellent   Clinical Impairments Affecting Rehab Potential none   PT Frequency 1x / week   PT Duration 8 weeks   PT Treatment/Interventions Electrical Stimulation;Moist Heat;Ultrasound;Therapeutic activities;Therapeutic exercise;Neuromuscular re-education;Patient/family education;Manual techniques;Taping;Passive range of motion   PT Next Visit Plan discharge with HEP   Consulted and Agree with Plan of Care Patient      Patient will benefit from skilled therapeutic intervention in order to improve the following deficits and impairments:  Pain, Postural dysfunction, Decreased range of motion, Hypomobility, Impaired UE functional use, Increased muscle spasms, Impaired flexibility  Visit Diagnosis: Abnormal posture  Muscle weakness (generalized)  Pain in right elbow     Problem List Patient Active Problem List   Diagnosis Date Noted  . Elevated transaminase level 06/16/2016  . Cataract, left eye 10/17/2014  . Foot callus 08/21/2014  . GERD (gastroesophageal reflux disease) 04/15/2014  . Anxiety disorder 04/02/2014  . Healthcare maintenance 04/02/2014  . Blurry vision 02/24/2014  . Hypertension 05/16/2012  . Overweight (BMI 25.0-29.9) 05/16/2012  . Enlarged thyroid 05/16/2012  . Hyperlipidemia 05/16/2012  . DM type 2 (diabetes mellitus, type 2) ([place]) 05/14/2012    [name], PT 06/30/2016, 1:02 PM   [facility] [address] [place], [place], [address] Phone: [phone]   Fax:  [phone]  Name: [place]ly [name] MRN: [id number] Date of Birth: [birth year]  PHYSICAL THERAPY DISCHARGE SUMMARY  Visits from Start of Care: 8  Current functional level related to goals / functional outcomes: See above goals   Remaining deficits: See above strength and ROM goals    Education / Equipment: HEP as seen in chart Plan: Patient agrees to discharge.  Patient goals were partially met. Patient  is being discharged due to meeting the stated rehab goals.  ?????

## 2016-07-01 ENCOUNTER — Encounter: Payer: Self-pay | Admitting: Family Medicine

## 2016-07-01 ENCOUNTER — Ambulatory Visit (INDEPENDENT_AMBULATORY_CARE_PROVIDER_SITE_OTHER): Payer: BLUE CROSS/BLUE SHIELD | Admitting: Family Medicine

## 2016-07-01 VITALS — BP 140/72 | HR 86 | Resp 12 | Ht 71.0 in | Wt 196.4 lb

## 2016-07-01 DIAGNOSIS — R05 Cough: Secondary | ICD-10-CM | POA: Diagnosis not present

## 2016-07-01 DIAGNOSIS — K219 Gastro-esophageal reflux disease without esophagitis: Secondary | ICD-10-CM

## 2016-07-01 DIAGNOSIS — R059 Cough, unspecified: Secondary | ICD-10-CM

## 2016-07-01 MED ORDER — BENZONATATE 100 MG PO CAPS: 100.0000 mg | ORAL_CAPSULE | Freq: Three times a day (TID) | ORAL | 0 refills | Status: AC | PRN

## 2016-07-01 MED ORDER — BENZONATATE 100 MG PO CAPS: 100.0000 mg | ORAL_CAPSULE | Freq: Three times a day (TID) | ORAL | 0 refills | Status: DC | PRN

## 2016-07-01 NOTE — Progress Notes (Signed)
    HPI:   [name] is a 60 y.o. female, who is here today to follow on recent office visit.  She was seen on 06/24/16 and Dx with viral URI. Fever, chills, and body aches have resolved. She is concerned because she is still coughing, whitish sputum, denies hemoptysis. Cough seems to be worse at night.  Cough seems to be worse at night.  She has Hx of GERD and currently she is on Omeprazole 20 mg daily.  Denies abdominal pain, nausea, vomiting, changes in bowel habits, blood in stool or melena. + Burping.  She denies chest pain, dyspnea, or wheezing.  Benzonatate 100 mg tid did not help stop cough but help some.    Review of Systems  Constitutional: Negative for activity change, appetite change, fatigue, fever and unexpected weight change.  HENT: Positive for postnasal drip. Negative for mouth sores, sinus pressure, sore throat, trouble swallowing and voice change.   Respiratory: Positive for cough. Negative for shortness of breath, wheezing and stridor.   Cardiovascular: Negative for chest pain and palpitations.  Gastrointestinal: Negative for abdominal pain, nausea and vomiting.  Skin: Negative for rash.  Allergic/Immunologic: Positive for environmental allergies.  Neurological: Negative for syncope, weakness and headaches.  Hematological: Negative for adenopathy. Does not bruise/bleed easily.      Current Outpatient Prescriptions on File Prior to Visit  Medication Sig Dispense Refill  . acetaminophen (TYLENOL) 500 MG tablet Take 1 tablet (500 mg total) by mouth every 6 (six) hours as needed. 30 tablet 0  . aspirin EC 81 MG tablet Take 1 tablet (81 mg total) by mouth daily.    . Biotin 1000 MCG tablet Take 1,000 mcg by mouth daily.    . Blood Glucose Monitoring Suppl (BLOOD GLUCOSE METER) kit Use as instructed 1 each 0  . budesonide-formoterol (SYMBICORT) 160-4.5 MCG/ACT inhaler Inhale 2 puffs into the lungs 2 (two) times daily. 1 Inhaler 3  . glyBURIDE  (DIABETA) 5 MG tablet TAKE 1 TABLET (5 MG TOTAL) BY MOUTH 2 (TWO) TIMES DAILY WITH A MEAL. 30 tablet 2  . hydrochlorothiazide (HYDRODIURIL) 25 MG tablet TAKE 1 TABLET (25 MG TOTAL) BY MOUTH DAILY. 90 tablet 1  . insulin glargine (LANTUS) 100 UNIT/ML injection Inject 0.65 mLs (65 Units total) into the skin at bedtime. 10 mL 11  . metFORMIN (GLUCOPHAGE) 1000 MG tablet TAKE 1 TABLET (1,000 MG TOTAL) BY MOUTH 2 (TWO) TIMES DAILY WITH A MEAL. 60 tablet 3  . omeprazole (PRILOSEC) 20 MG capsule Take 1 capsule (20 mg total) by mouth daily. 90 capsule 1  . simvastatin (ZOCOR) 20 MG tablet Take 1 tablet (20 mg total) by mouth at bedtime. 30 tablet 3  . TRUEPLUS LANCETS 28G MISC 1 each by Does not apply route 3 (three) times daily. 100 each 11  . TRUETEST TEST test strip USE AS DIRECTED BY MD 50 each 12   No current facility-administered medications on file prior to visit.      Past Medical History:  Diagnosis Date  . Diabetes mellitus 2001  . Hyperlipidemia Dx 2012  . Hypertension 2001   Allergies  Allergen Reactions  . Ace Inhibitors Cough    Social History   Social History  . Marital status: Divorced    Spouse name: N/A  . Number of children: N/A  . Years of education: N/A   Social History Main Topics  . Smoking status: Never Smoker  . Smokeless tobacco: None  . Alcohol use Yes       Comment: occ  . Drug use: No  . Sexual activity: Not Asked   Other Topics Concern  . None   Social History Narrative  . None    Vitals:   07/01/16 1513  BP: 140/72  Pulse: 86  Resp: 12    O2 sat 96% at RA.  Body mass index is 27.4 kg/m.   Physical Exam  Nursing note and vitals reviewed. Constitutional: She is oriented to person, place, and time. She appears well-developed and well-nourished. She does not appear ill. No distress.  HENT:  Head: Atraumatic.  Mouth/Throat: Oropharynx is clear and moist and mucous membranes are normal.  Eyes: Conjunctivae are normal.  Cardiovascular:  Normal rate and regular rhythm.   No murmur heard. Respiratory: Effort normal and breath sounds normal. No respiratory distress.  GI: Soft. She exhibits no mass. There is no hepatomegaly. There is no tenderness.  Lymphadenopathy:    She has no cervical adenopathy.  Neurological: She is alert and oriented to person, place, and time. She has normal strength. Gait normal.  Skin: Skin is warm. No rash noted. No erythema.  Psychiatric: She has a normal mood and affect.  Well groomed, good eye contact.      ASSESSMENT AND PLAN:     Kimberly [name] was seen today for cough.  Diagnoses and all orders for this visit:  Cough  We discussed possible causes. Explained that after URI cough and congestion can last a few weeks. Auscultation today negative, I do not think imaging is needed but still offered given her concern; she agrees with holding on CXR. Instructed about warning signs.     -     benzonatate (TESSALON PERLES) 100 MG capsule; Take 1-2 capsules (100-200 mg total) by mouth 3 (three) times daily as needed for cough.  Gastroesophageal reflux disease, esophagitis presence not specified  GERD can also be aggravating cough. GERD precautions discussed. She will increase Omeprazole dose for 3-4 weeks, 20 mg bid.   -She will keep next F/U appt.    Kimberly G. Jordan, MD  [facility]. [facility] office.              

## 2016-07-01 NOTE — Progress Notes (Signed)
Pre visit review using our clinic review tool, if applicable. No additional management support is needed unless otherwise documented below in the visit note. 

## 2016-07-01 NOTE — Patient Instructions (Addendum)
A few things to remember from today's visit:   Cough - Plan: benzonatate (TESSALON PERLES) 100 MG capsule  Gastroesophageal reflux disease, esophagitis presence not specified  viral infections are self-limited and we treat each symptom depending of severity, seems resolved and now having residual symptoms.  GERD can aggravate cough, so increase Omeprazole to 2 times daily for 3-4 weeks.     Honey helps with cough.  Cough and nasal congestion could last a few days and sometimes weeks. Please follow in not any better in 1-2 weeks or if symptoms get worse.  Please be sure medication list is accurate. If a new problem present, please set up appointment sooner than planned today.

## 2016-07-18 LAB — HM COLONOSCOPY

## 2016-08-12 ENCOUNTER — Encounter: Payer: Self-pay | Admitting: Family Medicine

## 2016-08-12 ENCOUNTER — Ambulatory Visit (INDEPENDENT_AMBULATORY_CARE_PROVIDER_SITE_OTHER): Payer: BLUE CROSS/BLUE SHIELD | Admitting: Family Medicine

## 2016-08-12 VITALS — BP 150/84 | HR 79 | Temp 98.1°F | Resp 12 | Ht 71.0 in | Wt 189.4 lb

## 2016-08-12 DIAGNOSIS — R05 Cough: Secondary | ICD-10-CM

## 2016-08-12 DIAGNOSIS — R634 Abnormal weight loss: Secondary | ICD-10-CM

## 2016-08-12 DIAGNOSIS — K219 Gastro-esophageal reflux disease without esophagitis: Secondary | ICD-10-CM | POA: Diagnosis not present

## 2016-08-12 DIAGNOSIS — I10 Essential (primary) hypertension: Secondary | ICD-10-CM | POA: Diagnosis not present

## 2016-08-12 DIAGNOSIS — R053 Chronic cough: Secondary | ICD-10-CM

## 2016-08-12 MED ORDER — BUDESONIDE-FORMOTEROL FUMARATE 160-4.5 MCG/ACT IN AERO: 2.0000 | INHALATION_SPRAY | Freq: Two times a day (BID) | RESPIRATORY_TRACT | 1 refills | Status: DC

## 2016-08-12 MED ORDER — ESOMEPRAZOLE MAGNESIUM 40 MG PO CPDR: 40.0000 mg | DELAYED_RELEASE_CAPSULE | Freq: Every day | ORAL | 1 refills | Status: DC

## 2016-08-12 MED ORDER — AMLODIPINE BESYLATE 5 MG PO TABS: 5.0000 mg | ORAL_TABLET | Freq: Every day | ORAL | 0 refills | Status: DC

## 2016-08-12 NOTE — Progress Notes (Signed)
Pre visit review using our clinic review tool, if applicable. No additional management support is needed unless otherwise documented below in the visit note. 

## 2016-08-12 NOTE — Patient Instructions (Addendum)
A few things to remember from today's visit:   Gastroesophageal reflux disease, esophagitis presence not specified - Plan: esomeprazole (NEXIUM) 40 MG capsule  Essential hypertension - Plan: amLODipine (NORVASC) 5 MG tablet  Persistent cough - Plan: Ambulatory referral to Pulmonology, budesonide-formoterol (SYMBICORT) 160-4.5 MCG/ACT inhaler     Avoid foods that make your symptoms worse, for example coffee, chocolate,pepermeint,alcohol, and greasy food. Raising the head of your bed about 6 inches may help with nocturnal symptoms.  Avoid tobacco use. Weight loss (if you are overweight). Avoid lying down for 3 hours after eating.  Instead 3 large meals daily try small and more frequent meals during the day.  Every medication have side effects and medications for GERD are not the exception.At this time I think benefit is greater than risk.  There has been some concerns about dementia and medications like Omeprazole or Nexium (PPI) but recent studies do not show a relation. Also kidney function can be affected among some patients that take these type of medications, we will follow accordingly. Taking these medications for long term could increase risk of osteoporosis (some debate now), vitamin deficiencies (Vit D and B12 specialty), increases risk of pneumonia.  You should be evaluated immediately if bloody vomiting, bloody stools, black stools (like tar), difficulty swallowing, food gets stuck on the way down or choking when eating. Abnormal weight loss or severe abdominal pain.  If symptoms are not resolved sometimes endoscopy is necessary.   Please be sure medication list is accurate. If a new problem present, please set up appointment sooner than planned today.

## 2016-08-12 NOTE — Progress Notes (Signed)
HPI:   ACUTE VISIT:  Chief Complaint  Patient presents with  . Cough    [name] is a 61 y.o. female, who is here today complaining of persistent cough. She has had cough since 2007, states that she is tired of coughing all the time. She tells me that she wants to see somebody that finds out why she is coughing, "this is not normal", she adds.  She is a poor historian, gets frustrated when inquiring about duration and associated symptoms. She has not had recent fever,chills, or body aches. No sick contact or recent travel.   Wheezing: "I do not know" She does not know for how long she has been coughing, if this is the same cough she has had or if this is a new cough. Sore throat last week, mild.  Hx of allergies, has has nasal congestion, post nasal drainage, and rhinorrhea. She is not taking any OTC antihistaminic or intranasal steroid.  She is also requesting a refill for Symbicort 160-4.5 mcg, which was prescribed by her former PCP , Dr [name], who thought cough was related to asthma.  She has not identified cough exacerbating or alleviating factors. Problem otherwise stable.  She has not used Symbicort "in a good while", she is not sure if this helped, "evidently did not help because I am still coughing."  She denies Hx of tobacco use, + secondhand exposure (father and sister smoked around her).   - She has Hx of GERD and Omeprazole 20 mg bid has not helped at all.  She also mentions weight loss. She states that she was eating "a lot of spinach" but had to discontinue because it was causing diarrhea (resolved). She is eating now more beans, which she tolerated well but seasoning with peppers, chills, and anions.  Denies abdominal pain, nausea, vomiting, blood in stool or melena. Reporting colonoscopy as current.   + Voice changes, "talking like a man."She denies dysphagia or hemoptysis.  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 BP reading at home 150-170's/90's.  Currently on HCTZ 25 mg daily.  She is taking medications as instructed, no side effects reported.  She has not noted unusual headache, visual changes, exertional chest pain or dyspnea,  focal weakness, or edema.  No orthopnea or PND.  Lab Results  Component Value Date   CREATININE 0.9 05/18/2016   BUN 12 05/18/2016   NA 139 05/18/2016   K 4.2 05/18/2016   CL 104 07/27/2013   CO2 27 07/27/2013     Review of Systems  Constitutional: Positive for fatigue. Negative for activity change, appetite change, chills and fever.  HENT: Positive for congestion, nosebleeds (occasionally, mainly right nostril.), postnasal drip, rhinorrhea and voice change. Negative for ear pain, mouth sores, sinus pain and trouble swallowing.   Eyes: Negative for discharge, redness and visual disturbance.  Respiratory: Positive for cough. Negative for shortness of breath.   Cardiovascular: Negative for chest pain and palpitations.  Gastrointestinal: Negative for abdominal pain, blood in stool, nausea and vomiting.  Endocrine: Negative for polydipsia, polyphagia and polyuria.  Musculoskeletal: Negative for gait problem, myalgias and neck pain.  Skin: Negative for rash.  Allergic/Immunologic: Positive for environmental allergies.  Neurological: Negative for syncope, weakness and headaches.  Hematological: Negative for adenopathy. Does not bruise/bleed easily.  Psychiatric/Behavioral: Positive for  sleep disturbance. Negative for confusion. The patient is nervous/anxious.       Current Outpatient Prescriptions on File Prior to Visit  Medication Sig Dispense Refill  . acetaminophen (TYLENOL) 500 MG tablet Take 1 tablet (500 mg total) by mouth every 6 (six) hours as needed. 30 tablet 0  . aspirin EC 81 MG tablet Take 1 tablet (81 mg total) by  mouth daily.    . Biotin 1000 MCG tablet Take 1,000 mcg by mouth daily.    . Blood Glucose Monitoring Suppl (BLOOD GLUCOSE METER) kit Use as instructed 1 each 0  . glyBURIDE (DIABETA) 5 MG tablet TAKE 1 TABLET (5 MG TOTAL) BY MOUTH 2 (TWO) TIMES DAILY WITH A MEAL. 30 tablet 2  . hydrochlorothiazide (HYDRODIURIL) 25 MG tablet TAKE 1 TABLET (25 MG TOTAL) BY MOUTH DAILY. 90 tablet 1  . insulin glargine (LANTUS) 100 UNIT/ML injection Inject 0.65 mLs (65 Units total) into the skin at bedtime. 10 mL 11  . metFORMIN (GLUCOPHAGE) 1000 MG tablet TAKE 1 TABLET (1,000 MG TOTAL) BY MOUTH 2 (TWO) TIMES DAILY WITH A MEAL. 60 tablet 3  . simvastatin (ZOCOR) 20 MG tablet Take 1 tablet (20 mg total) by mouth at bedtime. 30 tablet 3  . TRUEPLUS LANCETS 28G MISC 1 each by Does not apply route 3 (three) times daily. 100 each 11  . TRUETEST TEST test strip USE AS DIRECTED BY MD 50 each 12   No current facility-administered medications on file prior to visit.      Past Medical History:  Diagnosis Date  . Diabetes mellitus 2001  . Hyperlipidemia Dx 2012  . Hypertension 2001   Allergies  Allergen Reactions  . Ace Inhibitors Cough    Social History   Social History  . Marital status: Divorced    Spouse name: N/A  . Number of children: N/A  . Years of education: N/A   Social History Main Topics  . Smoking status: Never Smoker  . Smokeless tobacco: Never Used  . Alcohol use Yes     Comment: occ  . Drug use: No  . Sexual activity: Not Asked   Other Topics Concern  . None   Social History Narrative  . None    Vitals:   08/12/16 1134  BP: (!) 150/84  Pulse: 79  Resp: 12  Temp: 98.1 F (36.7 C)   Body mass index is 26.41 kg/m.  Wt Readings from Last 3 Encounters:  08/12/16 189 lb 6 oz (85.9 kg)  07/01/16 196 lb 7 oz (89.1 kg)  06/24/16 196 lb 1.6 oz (89 kg)    Physical Exam  Nursing note and vitals reviewed. Constitutional: She is oriented to person, place, and time. She appears  well-developed. She does not appear ill. No distress.  HENT:  Head: Atraumatic.  Nose: Rhinorrhea present. Right sinus exhibits no maxillary sinus tenderness and no frontal sinus tenderness. Left sinus exhibits no maxillary sinus tenderness and no frontal sinus tenderness.  Mouth/Throat: Uvula is midline, oropharynx is clear and moist and mucous membranes are normal.  Enlarged turbinates. Post nasal drainage. Mild dysphonia.   Eyes: Conjunctivae and EOM are normal.  Neck: No JVD present. No muscular tenderness present.  Cardiovascular: Normal rate and regular rhythm.   No murmur heard. Pulses:      Dorsalis pedis pulses are 2+ on the right side, and 2+ on the left side.  Respiratory: Effort normal and breath sounds normal. No respiratory distress. She has no decreased breath sounds. She has  no wheezes. She has no rales.  A few episodes of non productive cough during OV.  GI: Soft. She exhibits no mass. There is no hepatomegaly. There is no tenderness.  Musculoskeletal: She exhibits edema (trace pitting edema LE, bilateral.).  Lymphadenopathy:    She has no cervical adenopathy.       Right: No supraclavicular adenopathy present.       Left: No supraclavicular adenopathy present.  Neurological: She is alert and oriented to person, place, and time. She has normal strength. Coordination and gait normal.  Skin: Skin is warm. No erythema.  Psychiatric: Her mood appears anxious.  Well groomed, poor eye contact.      ASSESSMENT AND PLAN:   [place] was seen today for cough.  Diagnoses and all orders for this visit:  Gastroesophageal reflux disease, esophagitis presence not specified  GERD precautions discussed in detail and recommended. Stop omeprazole, she will try Nexium. Dysphonia she is reporting today, which she does not specify if it is a new problem or it has been persistent for a while; could be related to GERD.. I will see her back in 2 months, may consider ENT  evaluation.   -     esomeprazole (NEXIUM) 40 MG capsule; Take 1 capsule (40 mg total) by mouth daily at 12 noon.  Essential hypertension   Not well controlled. Possible complications of elevated BP discussed. Amlodipine 5 mg added today. No changes in HCTZ. Low salt diet. F/U in 2 months.  -     amLODipine (NORVASC) 5 MG tablet; Take 1 tablet (5 mg total) by mouth daily.  Persistent cough  We discussed a few possible etiologies: allergies,GERD, residual from recent URI,asthma,COPD among some. She tells me that her former PCP did a TB skin test a couple years ago: Negative. She has had CXR done, last one 03/2016: Negative. Start Symbicort twice daily. Appointment with pulmonologists will be arranged.   -     Ambulatory referral to Pulmonology -     Discontinue: budesonide-formoterol (SYMBICORT) 160-4.5 MCG/ACT inhaler; Inhale 2 puffs into the lungs 2 (two) times daily. -     budesonide-formoterol (SYMBICORT) 160-4.5 MCG/ACT inhaler; Inhale 2 puffs into the lungs 2 (two) times daily.  Unintended weight loss  She has lost about 6-7 Lb sine her last OV. Discussed possible causes: Dietary changes, poorly controlled DM II, as well as other more concerning illness. CXR done in the past negative and reporting negative PPD + no TB exposure.  Will follow in 2 months.   Return in about 2 months (around 10/10/2016) for HTN,wt,GERD.     -[name] was advised to return or notify a doctor immediately if symptoms worsen or new concerns arise.       Elenna Spratling G. Martinique, MD  [facility]. [place] office.

## 2016-09-06 ENCOUNTER — Institutional Professional Consult (permissible substitution): Payer: BLUE CROSS/BLUE SHIELD | Admitting: Internal Medicine

## 2016-10-05 ENCOUNTER — Encounter: Payer: Self-pay | Admitting: Internal Medicine

## 2016-10-05 ENCOUNTER — Ambulatory Visit (INDEPENDENT_AMBULATORY_CARE_PROVIDER_SITE_OTHER): Payer: BLUE CROSS/BLUE SHIELD | Admitting: Internal Medicine

## 2016-10-05 ENCOUNTER — Ambulatory Visit (INDEPENDENT_AMBULATORY_CARE_PROVIDER_SITE_OTHER): Payer: BLUE CROSS/BLUE SHIELD | Admitting: Family Medicine

## 2016-10-05 ENCOUNTER — Encounter: Payer: Self-pay | Admitting: *Deleted

## 2016-10-05 ENCOUNTER — Encounter: Payer: Self-pay | Admitting: Family Medicine

## 2016-10-05 ENCOUNTER — Other Ambulatory Visit (INDEPENDENT_AMBULATORY_CARE_PROVIDER_SITE_OTHER): Payer: BLUE CROSS/BLUE SHIELD

## 2016-10-05 VITALS — BP 130/80 | HR 75 | Resp 12 | Ht 71.0 in | Wt 186.1 lb

## 2016-10-05 VITALS — BP 126/66 | HR 82 | Ht 71.0 in | Wt 185.0 lb

## 2016-10-05 DIAGNOSIS — R053 Chronic cough: Secondary | ICD-10-CM

## 2016-10-05 DIAGNOSIS — R252 Cramp and spasm: Secondary | ICD-10-CM

## 2016-10-05 DIAGNOSIS — M79642 Pain in left hand: Secondary | ICD-10-CM

## 2016-10-05 DIAGNOSIS — R197 Diarrhea, unspecified: Secondary | ICD-10-CM

## 2016-10-05 DIAGNOSIS — J45991 Cough variant asthma: Secondary | ICD-10-CM

## 2016-10-05 DIAGNOSIS — R05 Cough: Secondary | ICD-10-CM

## 2016-10-05 LAB — COMPREHENSIVE METABOLIC PANEL
ALK PHOS: 60 U/L (ref 39–117)
ALT: 35 U/L (ref 0–35)
AST: 26 U/L (ref 0–37)
Albumin: 4.1 g/dL (ref 3.5–5.2)
BILIRUBIN TOTAL: 0.4 mg/dL (ref 0.2–1.2)
BUN: 17 mg/dL (ref 6–23)
CO2: 30 mEq/L (ref 19–32)
Calcium: 9.6 mg/dL (ref 8.4–10.5)
Chloride: 99 mEq/L (ref 96–112)
Creatinine, Ser: 0.84 mg/dL (ref 0.40–1.20)
GFR: 88.06 mL/min (ref >60.00)
GLUCOSE: 302 mg/dL — AB (ref 70–99)
Potassium: 3.7 mEq/L (ref 3.5–5.1)
Sodium: 138 mEq/L (ref 135–145)
TOTAL PROTEIN: 6.8 g/dL (ref 6.0–8.3)

## 2016-10-05 LAB — CBC WITH DIFFERENTIAL/PLATELET
Basophils Absolute: 0.1 10*3/uL (ref 0.0–0.1)
Basophils Relative: 0.9 % (ref 0.0–3.0)
EOS ABS: 0.2 10*3/uL (ref 0.0–0.7)
Eosinophils Relative: 3.3 % (ref 0.0–5.0)
HCT: 42.4 % (ref 36.0–46.0)
Hemoglobin: 14 g/dL (ref 12.0–15.0)
Lymphocytes Relative: 30.8 % (ref 12.0–46.0)
Lymphs Abs: 1.7 10*3/uL (ref 0.7–4.0)
MCHC: 33 g/dL (ref 30.0–36.0)
MCV: 85.3 fl (ref 78.0–100.0)
MONO ABS: 0.5 10*3/uL (ref 0.1–1.0)
Monocytes Relative: 8.7 % (ref 3.0–12.0)
NEUTROS ABS: 3.1 10*3/uL (ref 1.4–7.7)
NEUTROS PCT: 56.3 % (ref 43.0–77.0)
PLATELETS: 189 10*3/uL (ref 150.0–400.0)
RBC: 4.97 Mil/uL (ref 3.87–5.11)
RDW: 14.4 % (ref 11.5–15.5)
WBC: 5.6 10*3/uL (ref 4.0–10.5)

## 2016-10-05 LAB — CK: Total CK: 168 U/L (ref 7–177)

## 2016-10-05 LAB — TSH: TSH: 1.37 u[IU]/mL (ref 0.35–4.50)

## 2016-10-05 MED ORDER — BUDESONIDE-FORMOTEROL FUMARATE 160-4.5 MCG/ACT IN AERO: 2.0000 | INHALATION_SPRAY | Freq: Two times a day (BID) | RESPIRATORY_TRACT | 1 refills | Status: DC

## 2016-10-05 MED ORDER — BUDESONIDE-FORMOTEROL FUMARATE 80-4.5 MCG/ACT IN AERO: INHALATION_SPRAY | RESPIRATORY_TRACT | 11 refills | Status: DC

## 2016-10-05 NOTE — Progress Notes (Signed)
Subjective:     Patient ID: Charlann Noss, female   DOB: January 19, 1956,  MRN: [id number]  HPI   58 yobf never smoker healthy as child but onset in her 50's with rhinitis/cough/sob > allergist in [place] in her 20's dx as allergic to grass, given breathing treatments but poor recall of details but apparently got better enough, moved to [place] around 2001 got worse around 2015 and improved p started on symbicort but failed to improve on gerd rx and referred to pulmonary clinic 10/05/2016 by Dr   Inez Catalina Martinique   10/05/2016 1st [facility] office visit/ Reigan Tolliver   Chief Complaint  Patient presents with  . Pulmonary Consult    Referred by Dr. Betty Martinique. Pt states she had been coughing for "years", until started on Symbicort inhaler 3 months ago. She states once she started Symbicort, her cough has basically resolved. She had very minimal dry cough this am.    cough tended to be worse at hs and during the day if got around strong smells but improved on symbicort 160 and so stopped gerd rx and has not flared "does this mean I have asthma?" - note hfa quite poor so not clear how much she's actually getting  Not limited by breathing from desired activities    No obvious day to day or daytime variability or assoc excess/ purulent sputum or mucus plugs or hemoptysis or cp or chest tightness, subjective wheeze or overt sinus or hb symptoms. No unusual exp hx or h/o childhood pna/ asthma or knowledge of premature birth.  Sleeping ok without nocturnal  or early am exacerbation  of respiratory  c/o's or need for noct saba. Also denies any obvious fluctuation of symptoms with weather or environmental changes or other aggravating or alleviating factors except as outlined above   Current Medications, Allergies, Complete Past Medical History, Past Surgical History, Family History, and Social History were reviewed in [employer] record.  ROS  The following are not active complaints unless  bolded sore throat, dysphagia, dental problems, itching, sneezing,  nasal congestion or excess/ purulent secretions, ear ache,   fever, chills, sweats, unintended wt loss, classically pleuritic or exertional cp,  orthopnea pnd or leg swelling, presyncope, palpitations, abdominal pain, anorexia, nausea, vomiting, diarrhea  or change in bowel or bladder habits, change in stools or urine, dysuria,hematuria,  rash, arthralgias, visual complaints, headache, numbness, weakness or ataxia or problems with walking or coordination,  change in mood/affect or memory.              Review of Systems     Objective:   Physical Exam    amb bf nad/ unusual affect, slowed responses    Wt Readings from Last 3 Encounters:  10/05/16 185 lb (83.9 kg)  10/05/16 186 lb 2 oz (84.4 kg)  08/12/16 189 lb 6 oz (85.9 kg)    Vital signs reviewed - - Note on arrival 02 sats  96% on RA       HEENT: nl dentition, turbinates bilaterally, and oropharynx. Nl external ear canals without cough reflex   NECK :  without JVD/Nodes/TM/ nl carotid upstrokes bilaterally   LUNGS: no acc muscle use,  Nl contour chest which is clear to A and P bilaterally without cough on insp or exp maneuvers   CV:  RRR  no s3 or murmur or increase in P2, and no edema   ABD:  soft and nontender with nl inspiratory excursion in the supine position. No bruits or  organomegaly appreciated, bowel sounds nl  MS:  Nl gait/ ext warm without deformities, calf tenderness, cyanosis or clubbing No obvious joint restrictions   SKIN: warm and dry without lesions    NEURO:  alert, approp, nl sensorium with  no motor or cerebellar deficits apparent.        Assessment:

## 2016-10-05 NOTE — Patient Instructions (Signed)
A few things to remember from today's visit:   Muscle cramps - Plan: Comprehensive metabolic panel, TSH, CK  Hand pain, left  Chronic diarrhea - Plan: TSH  Metformin can cause or aggravate diarreha DIARRHEA. SIMVASTATIN CAN CAUSE A CRAMPS.  You can hold simvastatin for 3-4 weeks and monitor for changes. Gabapentin, muscle relaxants among some can be utilized to treat cramps. Try over-the-counter topical icy hot, sometimes might help.    We have ordered labs or studies at this visit.  It can take up to 1-2 weeks for results and processing. IF results require follow up or explanation, we will call you with instructions. Clinically stable results will be released to your [facility]. If you have not heard from [place] or cannot find your results in [facility] in 2 weeks please contact our office at [phone].  If you are not yet signed up for [facility], please consider signing up  Please be sure medication list is accurate. If a new problem present, please set up appointment sooner than planned today.

## 2016-10-05 NOTE — Patient Instructions (Addendum)
Continue symbicort 160 Take 2 puffs first thing in am and then another 2 puffs about 12 hours later.  But when you finish it up try the symbiocrt 80 dose instead same number of doses per day  Work on inhaler technique:  relax and gently blow all the way out then take a nice smooth deep breath back in, triggering the inhaler at same time you start breathing in.  Hold for up to 5 seconds if you can. Blow out thru nose. Rinse and gargle with water when done      Please remember to go to the lab department downstairs in the basement  for your tests - we will call you with the results when they are available.   Please schedule a follow up visit in 3 months but call sooner if needed   Copy sent to PCP

## 2016-10-05 NOTE — Progress Notes (Signed)
Pre visit review using our clinic review tool, if applicable. No additional management support is needed unless otherwise documented below in the visit note. 

## 2016-10-05 NOTE — Progress Notes (Signed)
HPI:   ACUTE VISIT:  Chief Complaint  Patient presents with  . left hand pain    Kimberly [name] is a 61 y.o. female, who is here today complaining of left hand pain, which has been going on "for a while." She [place] mentions cramps of LE, "why am I having diarrhea?", and would like refills for Symbicort.   Bilateral hand pain, now left > right. She states that right hand pain has improved with PT I recommended for her. I did referred her for PT due to right elbow pain she tells me the has had bilateral head pain for a while, right hand pain improved after doing PT for elbow pain, epicondylitis, in 03/2016. Sharp pain,8/10, she cannot specify if it is joint pain or diffuse pain. No recent injury or unusual activity. Pain is alleviated if she holds hand with right hand,pressing on palmar area. Index finger seems to be more affected. No limitation of ROM. Pain is intermittent,sharp,last a few minutes. She is not sure about exacerbating factors.  She denies recent injury. For the past 2 months occasional numbness left hand, occasionally dropping papers due to pain. + Stiffness. No edema or erythema.  Also she has noted cramps on some fingers , noted in the morning. Right handed. She has taken Tylenol.  -Lower extremity cramps: She has has crams,day and night,for long time now.Usually calves and thighs, intermittent,last a few minutes. She has not identified trigger factors and resolve spontaneously. She has not noted associated edema or erythema.  She has not noted back pain.  She takes Simvastatin,decreased dose from daily to q 2 days but did not seem to help.  For "a while" she has had toes tingling.She states that she has been told it is related to diabetes.  Hx of enlarged thyroid and DM II,she follows with endocrinologists.   -She is also complaining of diarrhea, exacerbated by eating, mainly when eating spinach and greens.  She is reporting having  colonoscopy and was told "everything was fine." She has had diarrhea for "a while",she has mild abdominal cramps right before defecation and resolves after. She has about 2 stools per day, denies incontinence or blood in the stool. She is on Metformin.  -She is also requesting refills for Symbicort 160-4.5 mcg,which I initially recommended for chronic cough. Last OV , 08/12/16, she requested referral to pulmonologist because she was not any better with inhalers and PPI. She is reporting today that she has not seen pulmonologist. She continue using Symbicort, cough has resolved. She discontinued Omeprazole.   She is a poor historian, shows frustration when questions related to problem are ask    Review of Systems  Constitutional: Positive for fatigue (improved). Negative for activity change, appetite change, fever and unexpected weight change.  HENT: Negative for mouth sores, nosebleeds, sore throat and trouble swallowing.   Eyes: Negative for redness and visual disturbance.  Respiratory: Negative for cough, shortness of breath and wheezing.   Cardiovascular: Negative for chest pain, palpitations and leg swelling.  Gastrointestinal: Positive for diarrhea. Negative for blood in stool, nausea and vomiting.       Negative for changes in bowel habits.  Endocrine: Negative for cold intolerance and heat intolerance.  Genitourinary: Negative for decreased urine volume and hematuria.  Musculoskeletal: Positive for myalgias. Negative for gait problem and neck pain.  Skin: Negative for color change and rash.  Allergic/Immunologic: Positive for environmental allergies.  Neurological: Positive for numbness. Negative for syncope, weakness and headaches.  Psychiatric/Behavioral: Negative for confusion. The patient is nervous/anxious.       Current Outpatient Prescriptions on File Prior to Visit  Medication Sig Dispense Refill  . amLODipine (NORVASC) 5 MG tablet Take 1 tablet (5 mg total) by mouth  daily. 90 tablet 0  . Blood Glucose Monitoring Suppl (BLOOD GLUCOSE METER) kit Use as instructed 1 each 0  . hydrochlorothiazide (HYDRODIURIL) 25 MG tablet TAKE 1 TABLET (25 MG TOTAL) BY MOUTH DAILY. 90 tablet 1  . metFORMIN (GLUCOPHAGE) 1000 MG tablet TAKE 1 TABLET (1,000 MG TOTAL) BY MOUTH 2 (TWO) TIMES DAILY WITH A MEAL. 60 tablet 3  . simvastatin (ZOCOR) 20 MG tablet Take 1 tablet (20 mg total) by mouth at bedtime. 30 tablet 3  . TRUEPLUS LANCETS 28G MISC 1 each by Does not apply route 3 (three) times daily. 100 each 11  . TRUETEST TEST test strip USE AS DIRECTED BY MD 50 each 12  . budesonide-formoterol (SYMBICORT) 80-4.5 MCG/ACT inhaler Take 2 puffs first thing in am and then another 2 puffs about 12 hours later. 1 Inhaler 11   No current facility-administered medications on file prior to visit.      Past Medical History:  Diagnosis Date  . Diabetes mellitus 2001  . Hyperlipidemia Dx 2012  . Hypertension 2001   Allergies  Allergen Reactions  . Ace Inhibitors Cough    Social History   Social History  . Marital status: Divorced    Spouse name: N/A  . Number of children: N/A  . Years of education: N/A   Social History Main Topics  . Smoking status: Never Smoker  . Smokeless tobacco: Never Used  . Alcohol use Yes     Comment: occ  . Drug use: No  . Sexual activity: Not Asked   Other Topics Concern  . None   Social History Narrative  . None    Vitals:   10/05/16 0854  BP: 130/80  Pulse: 75  Resp: 12  O2 sat at RA 98% Body mass index is 25.96 kg/m.   Physical Exam  Nursing note and vitals reviewed. Constitutional: She is oriented to person, place, and time. She appears well-developed and well-nourished. No distress.  HENT:  Head: Atraumatic.  Mouth/Throat: Oropharynx is clear and moist and mucous membranes are normal.  Eyes: Conjunctivae and EOM are normal. Pupils are equal, round, and reactive to light.  Neck: No tracheal deviation present. Thyromegaly  present. No thyroid mass present.  Cardiovascular: Normal rate and regular rhythm.   No murmur heard. Pulses:      Dorsalis pedis pulses are 2+ on the right side, and 2+ on the left side.  Respiratory: Effort normal and breath sounds normal. No respiratory distress.  GI: Soft. Bowel sounds are normal. She exhibits no mass. There is no hepatomegaly. There is no tenderness.  Musculoskeletal: She exhibits no edema or tenderness.       Left hand: She exhibits normal range of motion, no tenderness, no bony tenderness and no swelling.  Phalen and Tinel negative bilateral.  Lymphadenopathy:    She has no cervical adenopathy.  Neurological: She is alert and oriented to person, place, and time. She has normal strength. Coordination and gait normal.  Skin: Skin is warm. No erythema.  Psychiatric: Her mood appears anxious. Her affect is blunt.  Well groomed, poor eye contact.    ASSESSMENT AND PLAN:   Kimberly [name] was seen today for left hand pain.  Diagnoses and all orders for this visit:  Hand  pain, left  We discussed possible causes, ? OA, ? Carpal tunnel synd (Tinel and Phalen negative), cramps.  Today examination was otherwise normal,no pain or limitation of ROM. Continue Tylenol 500 mg tid as needed.   Muscle cramps  She is on statin and HCTZ. We discussed possible etiologies and treatment options:muscle relaxants ,Gabapentin,and Cymbalta.She is not intrerested in more medications. Topical OTC Icy hot may help with hand and LE cramp. I recommend holding on stain for 3-4 weeks and monitor for changes.   -     Comprehensive metabolic panel -     TSH -     CK  Diarrhea, unspecified type  We discussed some side effects of Metformin, which could be aggravating problem. Avoid food that trigger symptoms, low residual diet. She could take OTC Imodium if problem gets worse. Adequate hydration. I do not have colonoscopy report but she reports having one recently and negative.  -      TSH  Persistent cough  Cough variant asthma,COPD, and even GERD to consider. Resolved. No changes in current management. Refill sent for Symbicort, keep appt with pulmonologist.   -    Budesonide-formoterol (SYMBICORT) 160-4.5 MCG/ACT inhaler; Inhale 2 puffs into the lungs 2 (two) times daily.    Return if symptoms worsen or fail to improve, for Keep f/u appt.     Letroy Vazguez G. Martinique, MD  [facility]. [place] office.

## 2016-10-06 ENCOUNTER — Encounter: Payer: Self-pay | Admitting: Internal Medicine

## 2016-10-06 LAB — RESPIRATORY ALLERGY PROFILE REGION II ~~LOC~~
Allergen, A. alternata, m6: <0.1 kU/L
Allergen, Cedar tree, t12: <0.1 kU/L
Allergen, Comm Silver Birch, t9: <0.1 kU/L
Allergen, Cottonwood, t14: <0.1 kU/L
Allergen, D pternoyssinus,d7: <0.1 kU/L
Allergen, Mouse Urine Protein, e78: <0.1 kU/L
Allergen, Mulberry, t76: <0.1 kU/L
Allergen, P. notatum, m1: <0.1 kU/L
Aspergillus fumigatus, m3: <0.1 kU/L
Bermuda Grass: <0.1 kU/L
Cockroach: <0.1 kU/L
Common Ragweed: <0.1 kU/L
IgE (Immunoglobulin E), Serum: 7 kU/L (ref <115)
Pecan/Hickory Tree IgE: <0.1 kU/L
Rough Pigweed  IgE: <0.1 kU/L
Timothy Grass: <0.1 kU/L

## 2016-10-06 NOTE — Assessment & Plan Note (Addendum)
10/05/2016  After extensive coaching HFA effectiveness =    75% from a baseline of < 25% - Spirometry 10/05/2016  No obst p am symb 160 - Allergy profile 10/05/2016 >  Eos 0. /  IgE    Based on hx suggesting atopy dating back to 36s and response to very low amts of symbicort (as her baseline hfa is so poor) she most likely has asthma and now describes non-specific triggers like strong smells rather than typical seasonal pattern but is so much better on ICS that if she learns to use it correctly can probably step down  to much lower doses of ICS/ general avoidance measures reviewed/ letter for work done as well.   Will start with symb 80 2bid with goal of taper to qvar 40 2bid or similar product  Total time devoted to counseling  > 50 % of initial 60 min office visit:  review case with pt/ discussion of options/alternatives/ personally creating written customized instructions  in presence of pt  then going over those specific  Instructions directly with the pt including how to use all of the meds but in particular covering each new medication in detail and the difference between the maintenance= "automatic" meds and the prns using an action plan format for the latter (If this problem/symptom => do that organization reading Left to right).  Please see AVS from this visit for a full list of these instructions which I personally wrote for this pt and  are unique to this visit.

## 2016-10-07 ENCOUNTER — Encounter: Payer: Self-pay | Admitting: Family Medicine

## 2016-10-14 ENCOUNTER — Ambulatory Visit: Payer: BLUE CROSS/BLUE SHIELD | Admitting: Family Medicine

## 2016-11-01 ENCOUNTER — Telehealth: Payer: Self-pay | Admitting: Family Medicine

## 2016-11-01 NOTE — Telephone Encounter (Signed)
LMTCB No notes in chart about stopping Metformin

## 2016-11-01 NOTE — Telephone Encounter (Signed)
Pt would like to know what she can take for constipation. Pt states it has been about 8 days.  Pt is a diabetic. Pt stopped metformin because it causes diarrhea.Kimberly [name] to leave message

## 2016-11-01 NOTE — Telephone Encounter (Signed)
Spoke with pt and she states that she was taken off Metformin by Dr. Jeanann Lewandowsky. She was placed on Lantus, 60U once daily. Chart has been updated. She also c/o constipation. She has not tried adding more fiber to her diet or taking any OTC medications/supplements. Advised pt to try Miralax or OTC stool softener/laxative per directions on package. If she does not have a bowel movement within the next 24 hours or develops abdominal pain, f/n/v to contact office for appt. Nothing further needed at this time.

## 2016-12-03 ENCOUNTER — Other Ambulatory Visit: Payer: Self-pay | Admitting: Family Medicine

## 2016-12-03 DIAGNOSIS — I10 Essential (primary) hypertension: Secondary | ICD-10-CM

## 2016-12-04 ENCOUNTER — Other Ambulatory Visit: Payer: Self-pay | Admitting: Family Medicine

## 2016-12-04 DIAGNOSIS — R05 Cough: Secondary | ICD-10-CM

## 2016-12-04 DIAGNOSIS — R053 Chronic cough: Secondary | ICD-10-CM

## 2017-01-26 ENCOUNTER — Other Ambulatory Visit: Payer: Self-pay | Admitting: Family Medicine

## 2017-01-26 DIAGNOSIS — I10 Essential (primary) hypertension: Secondary | ICD-10-CM

## 2017-01-27 ENCOUNTER — Telehealth: Payer: Self-pay

## 2017-01-27 ENCOUNTER — Encounter: Payer: Self-pay | Admitting: Family Medicine

## 2017-01-27 ENCOUNTER — Ambulatory Visit (INDEPENDENT_AMBULATORY_CARE_PROVIDER_SITE_OTHER): Payer: BLUE CROSS/BLUE SHIELD | Admitting: Family Medicine

## 2017-01-27 VITALS — BP 118/70 | HR 78 | Resp 12 | Ht 71.0 in | Wt 190.4 lb

## 2017-01-27 DIAGNOSIS — Z794 Long term (current) use of insulin: Secondary | ICD-10-CM | POA: Diagnosis not present

## 2017-01-27 DIAGNOSIS — R200 Anesthesia of skin: Secondary | ICD-10-CM

## 2017-01-27 DIAGNOSIS — R202 Paresthesia of skin: Secondary | ICD-10-CM | POA: Diagnosis not present

## 2017-01-27 DIAGNOSIS — E1140 Type 2 diabetes mellitus with diabetic neuropathy, unspecified: Secondary | ICD-10-CM

## 2017-01-27 LAB — POCT GLYCOSYLATED HEMOGLOBIN (HGB A1C): HEMOGLOBIN A1C: 12.5

## 2017-01-27 MED ORDER — METFORMIN HCL 500 MG PO TABS: 500.0000 mg | ORAL_TABLET | Freq: Two times a day (BID) | ORAL | 1 refills | Status: DC

## 2017-01-27 MED ORDER — PREGABALIN 25 MG PO CAPS
50.0000 mg | ORAL_CAPSULE | Freq: Every day | ORAL | 0 refills | Status: DC
Start: 2017-01-27 — End: 2017-03-07

## 2017-01-27 NOTE — Telephone Encounter (Signed)
PA approved, form faxed back to pharmacy. 

## 2017-01-27 NOTE — Patient Instructions (Addendum)
  A few things to remember from today's visit:   Type 2 diabetes mellitus with diabetic neuropathy, with long-term current use of insulin ([place]) - Plan: POCT glycosylated hemoglobin (Hb A1C), metFORMIN (GLUCOPHAGE) 500 MG tablet  Numbness and tingling of both feet - Plan: pregabalin (LYRICA) 25 MG capsule   Please be sure medication list is accurate. If a new problem present, please set up appointment sooner than planned today.   A1C today 12.5, poorly controlled diabetes.  Please schedule follow up with your endocrinologist.  Better controlled of problem may help with feet discomfort.  Lyrica start 1 cap and increase to 2 caps at bedtime.   Metformin added back. No changes in Lantus.    As discussed today you have been dissatisfied with my care, as we both know this is not new, you have voiced this in prior visits. I am recommending establishing with a new PCP, we have a new female doctor coming next week, Dr Luana Shu, who may meet your expectations better than I have.

## 2017-01-27 NOTE — Telephone Encounter (Signed)
Received PA request for Lyrica. PA submitted & is pending. Key: [id number]

## 2017-01-27 NOTE — Progress Notes (Signed)
ACUTE VISIT   HPI:  Chief Complaint  Patient presents with  . feet hurt & tingling    Ms.Loukisha Mccayla Shimada is a 61 y.o. female, who is here today complaining of numbness,tingling,and pounding pain on feet for "a few months." Left worse than right, more noticeable at night and keeping ehr from sleep. + Needle sensation. She has not noted focal weakness or skin rash.  I saw Ms. Low on 10/05/2016.   Neurologic Problem  The patient's primary symptoms include focal sensory loss. The patient's pertinent negatives include no altered mental status, clumsiness, focal weakness, loss of balance, slurred speech, syncope, visual change or weakness. This is a recurrent problem. The problem has been gradually worsening since onset. There was lower extremity focality noted. Associated symptoms include fatigue. Pertinent negatives include no chest pain, confusion, fever, headaches, nausea, palpitations, shortness of breath or vomiting. Past treatments include acetaminophen. (DM II)   Hx of poorly controlled DM II. She has not followed with Dr Carlis Abbott as recommended. Currently she is on Lantus 60 U daily. She was on Metformin in the past but didn't tolerate medication well. Her insurance didn't cover Januvia.  She is not checking BS.  She is requesting A1C check today because she is concerned about last HgA1C.   Lab Results  Component Value Date   HGBA1C 11.3 05/18/2016   She is a poor historian, so I have difficulty collecting Hx.  She is evidently upset and annoyed about questions in regard to above concern as well as diabetes follow-ups, BS"s, treatments,etc.   In the past she has been rude towards me and has voiced dissatisfaction with care.  Today she again voices dissatisfaction with my care as well as with Dr. Ainsley Spinner care, her endocrinologist.   She is not following on chronic conditions as recommended.  She also mentions that she recently started Omeprazole for chronic  cough treatment. She follows with Dr Melvyn Novas, last seen in 09/2016, 3 months f/u was recommended.    Review of Systems  Constitutional: Positive for fatigue. Negative for appetite change, chills and fever.  HENT: Negative for mouth sores and trouble swallowing.   Respiratory: Negative for shortness of breath.   Cardiovascular: Negative for chest pain, palpitations and leg swelling.  Gastrointestinal: Negative for nausea and vomiting.  Endocrine: Negative for polydipsia, polyphagia and polyuria.  Genitourinary: Negative for decreased urine volume and hematuria.  Musculoskeletal: Positive for arthralgias. Negative for gait problem and joint swelling.  Skin: Negative for rash and wound.  Allergic/Immunologic: Positive for environmental allergies.  Neurological: Positive for numbness. Negative for focal weakness, syncope, facial asymmetry, speech difficulty, weakness, headaches and loss of balance.  Psychiatric/Behavioral: Negative for confusion. The patient is nervous/anxious.       Current Outpatient Prescriptions on File Prior to Visit  Medication Sig Dispense Refill  . amLODipine (NORVASC) 5 MG tablet TAKE ONE TABLET BY MOUTH DAILY 90 tablet 1  . budesonide-formoterol (SYMBICORT) 80-4.5 MCG/ACT inhaler Take 2 puffs first thing in am and then another 2 puffs about 12 hours later. 1 Inhaler 11  . hydrochlorothiazide (HYDRODIURIL) 25 MG tablet TAKE ONE TABLET BY MOUTH DAILY 90 tablet 2  . insulin glargine (LANTUS) 100 UNIT/ML injection Inject 0.6 mLs (60 Units total) into the skin at bedtime. 10 mL 11  . simvastatin (ZOCOR) 20 MG tablet Take 1 tablet (20 mg total) by mouth at bedtime. 30 tablet 3  . Insulin Glargine-Lixisenatide (SOLIQUA) 100-33 UNT-MCG/ML SOPN Inject 45 Units into the skin  daily.     No current facility-administered medications on file prior to visit.      Past Medical History:  Diagnosis Date  . Diabetes mellitus 2001  . Hyperlipidemia Dx 2012  . Hypertension 2001     Allergies  Allergen Reactions  . Ace Inhibitors Cough    Social History   Social History  . Marital status: Divorced    Spouse name: N/A  . Number of children: N/A  . Years of education: N/A   Social History Main Topics  . Smoking status: Never Smoker  . Smokeless tobacco: Never Used  . Alcohol use Yes     Comment: occ  . Drug use: No  . Sexual activity: Not Asked   Other Topics Concern  . None   Social History Narrative  . None    Vitals:   01/27/17 1102  BP: 118/70  Pulse: 78  Resp: 12  O2 sat at RA 97% Body mass index is 26.55 kg/m.    Physical Exam  Nursing note and vitals reviewed. Constitutional: She is oriented to person, place, and time. She appears well-developed and well-nourished. No distress.  HENT:  Head: Atraumatic.  Mouth/Throat: Oropharynx is clear and moist and mucous membranes are normal.  Eyes: Pupils are equal, round, and reactive to light. Conjunctivae are normal.  Cardiovascular: Normal rate and regular rhythm.   No murmur heard. Pulses:      Dorsalis pedis pulses are 2+ on the right side, and 2+ on the left side.  Respiratory: Effort normal and breath sounds normal. No respiratory distress.  GI: Soft. She exhibits no mass. There is no hepatomegaly. There is no tenderness.  Musculoskeletal: She exhibits no edema or tenderness.  Neurological: She is alert and oriented to person, place, and time. She has normal strength. Coordination and gait normal.  Skin: Skin is warm. No rash noted. No erythema.  Psychiatric: Her mood appears anxious. Her affect is blunt.  Well groomed, poor eye contact.    Diabetic foot exam:  Monofilament normal bilateral. Peripheral pulses present (DP). No ulcers/excoriations  Bilateral plantar calluses + Hypertrophic/long toenails.   ASSESSMENT AND PLAN:   Ms. Jasime was seen today for feet hurt & tingling.  Diagnoses and all orders for this visit:  Lab Results  Component Value Date   HGBA1C  12.5 01/27/2017    Type 2 diabetes mellitus with diabetic neuropathy, with long-term current use of insulin ([place])  Poorly controlled. We dicussed in detail possible complications of poorly controlled DM. Some treatment options discussed,she decides going back to Metformin, so she can start los dose. No changes in Lantus. Strongly recommend arranging appt with Dr Carlis Abbott. Eye exam periodically recommended, at least annually.  -     POCT glycosylated hemoglobin (Hb A1C) -     metFORMIN (GLUCOPHAGE) 500 MG tablet; Take 1 tablet (500 mg total) by mouth 2 (two) times daily with a meal.  Numbness and tingling of both feet  After discussion of a few possible etiologies,I explained that symptoms are suggestive of peripheral neuropathy. B12 and/or EMG may need to be considered if not improvement. Better DM controlled is very important. Educated about foot care, podiatrist may also be appropriate. Options in regard to treatment and some side effects discussed, she would like to try Lyrica but low dose. F/U in 2-3 months.   -     pregabalin (LYRICA) 25 MG capsule; Take 2 capsules (50 mg total) by mouth at bedtime.    26 min face to  face OV. > 50% was dedicated to discussion of above Dx, prognosis, treatment options, and some side effects of medications. She is upset because I cannot predict if symptoms are going to resolve completely. Explained that a better controlled diabetes may prevent further damage and even improve symptoms but not sure about complete resolution. Also educated about the importance of compliance with treatments and F/U appts. Recommend trying to find a provider with who she feels more comfortable,explained that this is one of many rights she has as a patient.    Return if symptoms worsen or fail to improve, for Needs appt with endocrinologists. F/U with new PCP in 2-3 months..     Felisa Zechman G. Martinique, MD  [facility]. [place]  office.

## 2017-03-03 ENCOUNTER — Telehealth: Payer: Self-pay | Admitting: Internal Medicine

## 2017-03-03 NOTE — Telephone Encounter (Signed)
Delsym cough syrup 5 ML twice daily  Call us back for antibiotic if sputum changes color

## 2017-03-03 NOTE — Telephone Encounter (Signed)
Pt c/o mostly nonprod cough with some clear mucus, continual throat clearing, feels like "something is stuck in my throat" X1 week.  Denies fever, chest pain, sinus congestion.    Pt taking maintenance inhalers to help with s/s, requesting further recs.    Pt uses [facility] on [address].  Sending to DOD as MW is unavailable today.  RA please advise on further recs.  Thanks!

## 2017-03-03 NOTE — Telephone Encounter (Signed)
Called and spoke with pt and she is aware of RA recs and will try this over the weekend.  I have scheduled an appt with MW on Tuesday for the pt.

## 2017-03-07 ENCOUNTER — Ambulatory Visit (INDEPENDENT_AMBULATORY_CARE_PROVIDER_SITE_OTHER): Payer: BLUE CROSS/BLUE SHIELD | Admitting: Internal Medicine

## 2017-03-07 ENCOUNTER — Encounter: Payer: Self-pay | Admitting: Internal Medicine

## 2017-03-07 ENCOUNTER — Ambulatory Visit: Payer: BLUE CROSS/BLUE SHIELD | Admitting: Internal Medicine

## 2017-03-07 VITALS — BP 124/78 | HR 86 | Ht 71.0 in | Wt 197.0 lb

## 2017-03-07 DIAGNOSIS — Z23 Encounter for immunization: Secondary | ICD-10-CM | POA: Diagnosis not present

## 2017-03-07 DIAGNOSIS — J45991 Cough variant asthma: Secondary | ICD-10-CM | POA: Diagnosis not present

## 2017-03-07 MED ORDER — TRAMADOL HCL 50 MG PO TABS: ORAL_TABLET | ORAL | 0 refills | Status: DC

## 2017-03-07 MED ORDER — BENZONATATE 200 MG PO CAPS: 200.0000 mg | ORAL_CAPSULE | Freq: Three times a day (TID) | ORAL | 2 refills | Status: DC | PRN

## 2017-03-07 MED ORDER — BUDESONIDE-FORMOTEROL FUMARATE 160-4.5 MCG/ACT IN AERO: 2.0000 | INHALATION_SPRAY | Freq: Two times a day (BID) | RESPIRATORY_TRACT | 0 refills | Status: DC

## 2017-03-07 MED ORDER — METHYLPREDNISOLONE ACETATE 80 MG/ML IJ SUSP
120.0000 mg | Freq: Once | INTRAMUSCULAR | Status: AC
  Administered 2017-03-07: 120 mg via INTRAMUSCULAR

## 2017-03-07 NOTE — Patient Instructions (Addendum)
symbicort 160 Take 2 puffs first thing in am and then another 2 puffs about 12 hours later.     As needed for cough >  Tessalon 200 mg every 6-8 hours and supplement by adding tramadol 50 mg one every 4 hours though this may make you sleepy  GERD (REFLUX)  is an extremely common cause of respiratory symptoms just like yours , many times with no obvious heartburn at all (it's not all about acid which you found to be true before as the acid reflux medications did not help)    It can be treated with medication, but also with lifestyle changes including elevation of the head of your bed (ideally with 6 inch  bed blocks),  Smoking cessation, avoidance of late meals, excessive alcohol, and avoid fatty foods, chocolate, peppermint, colas, red wine, and acidic juices such as orange juice.  NO MINT OR MENTHOL PRODUCTS SO NO COUGH DROPS  USE SUGARLESS CANDY INSTEAD (Jolley ranchers or Stover's or Life Savers) or even ice chips will also do - the key is to swallow to prevent all throat clearing. NO OIL BASED VITAMINS - use powdered substitutes.     If not satisfied return here with all medications in hand in 2 weeks to start the cough work up from scratch

## 2017-03-07 NOTE — Progress Notes (Signed)
Subjective:     Patient ID: Kimberly [name], female   DOB: [birth year],  MRN: [id number]     Brief patient profile:  43 yobf never smoker healthy as child but onset in her 54's with rhinitis/cough/sob > allergist in [place] in her 20's dx as allergic to grass, given breathing treatments but poor recall of details but apparently got better enough, moved to [place] around 2001 got worse around 2015 and improved p started on symbicort but failed to improve on gerd rx and referred to pulmonary clinic 10/05/2016 by Dr   Betty Martinique    History of Present Illness  10/05/2016 1st [facility] office visit/ Kimberly [name]   Chief Complaint  Patient presents with  . Pulmonary Consult    Referred by Dr. Betty Martinique. Pt states she had been coughing for "years", until started on Symbicort inhaler 3 months ago. She states once she started Symbicort, her cough has basically resolved. She had very minimal dry cough this am.    cough tended to be worse at hs and during the day if got around strong smells but improved on symbicort 160 and so stopped gerd rx and has not flared "does this mean I have asthma?" - note hfa quite poor so not clear how much she's actually getting rec Continue symbicort 160 Take 2 puffs first thing in am and then another 2 puffs about 12 hours later.  But when you finish it up try the symbiocrt 80 dose instead same number of doses per day Work on inhaler technique:   Please remember to go to the lab department downstairs in the basement  for your tests - we will call you with the results when they are available.     03/07/2017 acute extended ov/Kimberly [name] re: recurring coughing x decades,  now worse x 2 weeks Chief Complaint  Patient presents with  . Acute Visit    Increased cough x 2 wks- prod with white sputum. She states her chest feels sore at times.  cough recurrent x decades pattern is that it goes away completely for months at a time then while on symb 80 2bid and no gerd rx flaired s  obvious trigger x 2 weeks Cough as bad day vs noct assoc with gen ant chest discomfort during coughing fits Pharmacy confirmed she's refilling her symb 160, not the 80 as rec though hfa not optimal - see a/p  No obvious day to day or daytime variability or assoc excess/ purulent sputum or mucus plugs or hemoptysis   or chest tightness, subjective wheeze or overt sinus or hb symptoms. No unusual exp hx or h/o childhood pna/ asthma or knowledge of premature birth.  Sleeping ok flat without nocturnal  or early am exacerbation  of respiratory  c/o's or need for noct saba. Also denies any obvious fluctuation of symptoms with weather or environmental changes or other aggravating or alleviating factors except as outlined above   Current Allergies, Complete Past Medical History, Past Surgical History, Family History, and Social History were reviewed in [employer] record.  ROS  The following are not active complaints unless bolded sore throat, dysphagia, dental problems, itching, sneezing,  nasal congestion or disharge of excess mucus or purulent secretions, ear ache,   fever, chills, sweats, unintended wt loss or wt gain, classically pleuritic or exertional cp,  orthopnea pnd or leg swelling, presyncope, palpitations, abdominal pain, anorexia, nausea, vomiting, diarrhea  or change in bowel habits or bladder habits, change in stools or change  in urine, dysuria, hematuria,  rash, arthralgias, visual complaints, headache, numbness, weakness or ataxia or problems with walking or coordination,  change in mood/affect or memory.              Objective:   Physical Exam    amb bf nad    03/07/2017        197   10/05/16 185 lb (83.9 kg)  10/05/16 186 lb 2 oz (84.4 kg)  08/12/16 189 lb 6 oz (85.9 kg)    Vital signs reviewed  - Note on arrival 02 sats  98% on RA     HEENT: nl dentition, turbinates bilaterally, and oropharynx. Nl external ear canals without cough reflex   NECK :   without JVD/Nodes/TM/ nl carotid upstrokes bilaterally   LUNGS: no acc muscle use,  Nl contour chest which is clear to A and P bilaterally without cough on insp or exp maneuvers   CV:  RRR  no s3 or murmur or increase in P2, and no edema   ABD:  soft and nontender with nl inspiratory excursion in the supine position. No bruits or organomegaly appreciated, bowel sounds nl  MS:  Nl gait/ ext warm without deformities, calf tenderness, cyanosis or clubbing No obvious joint restrictions   SKIN: warm and dry without lesions    NEURO:  alert, approp, nl sensorium with  no motor or cerebellar deficits apparent.           Assessment:

## 2017-03-09 NOTE — Assessment & Plan Note (Addendum)
-   Spirometry 10/05/2016  No obst p am symb 160 - Allergy profile 10/05/2016 >  Eos 0.2/  IgE  7 neg RAST - recurrent flare x mid to late august 2018  - 03/07/2017  After extensive coaching HFA effectiveness =    75% > continue symb 160 2bid plus tessilon/tramadol   She was convinced the symb 160 corrected the cough but I had wanted to try step down to 80 to see if did just as well - that didn't apparently happened and now flaring on the 160 albeit with suboptimal hfa  rec Work on hfa/ suppress cough to eliminate cyclical cough > f/u with all meds in hand   Reviewed with pt: The standardized cough guidelines published in Chest by Lissa Morales in 2006 are still the best available and consist of a multiple step process (up to 12!) , not a single office visit,  and are intended  to address this problem logically,  with an alogrithm dependent on response to empiric treatment at  each progressive step  to determine a specific diagnosis with  minimal addtional testing needed. Therefore if adherence is an issue or can't be accurately verified,  it's very unlikely the standard evaluation and treatment will be successful here.    Furthermore, response to therapy (other than acute cough suppression, which should only be used short term with avoidance of narcotic containing cough syrups if possible), can be a gradual process for which the patient is not likely to  perceive immediate benefit.  Unlike going to an eye doctor where the best perscription is almost always the first one and is immediately effective, this is almost never the case in the management of chronic cough syndromes. Therefore the patient needs to commit up front to consistently adhere to recommendations  for up to 6 weeks of therapy directed at the likely underlying problem(s) before the response can be reasonably evaluated.    I had an extended discussion with the patient reviewing all relevant studies completed to date and  lasting 15 to 20  minutes of a 25 minute acute  visit    Each maintenance medication was reviewed in detail including most importantly the difference between maintenance and prns and under what circumstances the prns are to be triggered using an action plan format that is not reflected in the computer generated alphabetically organized AVS.    Please see AVS for specific instructions unique to this visit that I personally wrote and verbalized to the the pt in detail and then reviewed with pt  by my nurse highlighting any  changes in therapy recommended at today's visit to their plan of care.

## 2017-03-16 ENCOUNTER — Encounter: Payer: Self-pay | Admitting: Family Medicine

## 2017-03-22 ENCOUNTER — Telehealth: Payer: Self-pay | Admitting: Internal Medicine

## 2017-03-22 NOTE — Telephone Encounter (Signed)
Called and spoke with pt and she stated that her cough is some better but she is still coughing and still has the white/clear sputum. She stated that over the weekend she was having some chest discomfort and chills, but not since that time.  She stated that she did stop taking one of the BP pills, but she could not tell me the name of this med.  She stated that it has been 3 days since she stopped this med and this is when she started to feel some better.  MW please advise. Thanks  Allergies  Allergen Reactions  . Ace Inhibitors Cough

## 2017-03-22 NOTE — Telephone Encounter (Signed)
It takes several weeks tpically to see improvement in symptoms even when the changes were exactly what she needed so continue tessalon prn and see [place] next week as add on with all meds in hand if not better by then

## 2017-03-22 NOTE — Telephone Encounter (Signed)
Pt is aware of MW's recommendations and voiced her understanding. Pt states she will call back to schedule next week if no improvement in symptoms. Nothing further needed

## 2017-06-18 ENCOUNTER — Other Ambulatory Visit: Payer: Self-pay | Admitting: Family Medicine

## 2017-06-18 DIAGNOSIS — I10 Essential (primary) hypertension: Secondary | ICD-10-CM

## 2017-06-26 ENCOUNTER — Other Ambulatory Visit: Payer: Self-pay | Admitting: *Deleted

## 2017-06-26 DIAGNOSIS — E1140 Type 2 diabetes mellitus with diabetic neuropathy, unspecified: Secondary | ICD-10-CM

## 2017-06-26 DIAGNOSIS — Z794 Long term (current) use of insulin: Principal | ICD-10-CM

## 2017-06-26 MED ORDER — METFORMIN HCL 500 MG PO TABS: 500.0000 mg | ORAL_TABLET | Freq: Two times a day (BID) | ORAL | 1 refills | Status: DC

## 2017-06-28 ENCOUNTER — Telehealth: Payer: Self-pay | Admitting: Family Medicine

## 2017-06-28 NOTE — Telephone Encounter (Signed)
Copied from [place]. Topic: Appointment Scheduling - Scheduling Inquiry for Clinic >> Jun 26, 2017  4:11 PM [name] wrote: Reason for CRM: pt would like to switch from Dr Martinique to Dr Volanda Napoleon.  She states that she has already discussed this with Dr Martinique and she is aware of the pts desire to change providers. Please call [phone]

## 2017-06-29 NOTE — Telephone Encounter (Signed)
Please advise if okay for patient to switch providers. Thank you.

## 2017-07-06 NOTE — Telephone Encounter (Signed)
Patient needs a 60 min slot

## 2017-07-06 NOTE — Telephone Encounter (Signed)
Left a VM for patient to give the office a call to schedule a new patient appointment.

## 2017-07-06 NOTE — Telephone Encounter (Signed)
ok 

## 2017-07-10 NOTE — Telephone Encounter (Signed)
Patient has been scheduled

## 2017-08-16 ENCOUNTER — Ambulatory Visit: Payer: BLUE CROSS/BLUE SHIELD | Admitting: Family Medicine

## 2017-08-22 ENCOUNTER — Other Ambulatory Visit: Payer: Self-pay | Admitting: Family Medicine

## 2017-09-12 ENCOUNTER — Other Ambulatory Visit: Payer: Self-pay | Admitting: Internal Medicine

## 2017-09-12 DIAGNOSIS — J45991 Cough variant asthma: Secondary | ICD-10-CM

## 2017-09-13 ENCOUNTER — Telehealth: Payer: Self-pay | Admitting: Internal Medicine

## 2017-09-13 ENCOUNTER — Telehealth: Payer: Self-pay | Admitting: *Deleted

## 2017-09-13 DIAGNOSIS — J45991 Cough variant asthma: Secondary | ICD-10-CM

## 2017-09-13 MED ORDER — BENZONATATE 200 MG PO CAPS: 200.0000 mg | ORAL_CAPSULE | Freq: Three times a day (TID) | ORAL | 0 refills | Status: DC | PRN

## 2017-09-13 NOTE — Telephone Encounter (Signed)
Left message for patient to call clinic to schedule a follow-up for medication refill.

## 2017-09-13 NOTE — Telephone Encounter (Signed)
Okay for Tessalon 200 mg 3 times daily as needed #90

## 2017-09-13 NOTE — Telephone Encounter (Signed)
Requesting new script for Lyrica 25 mg capsule

## 2017-09-13 NOTE — Telephone Encounter (Signed)
Last OV 01/2017,she was supposed to have 2-3 months follow up.  Thanks, BJ

## 2017-09-13 NOTE — Telephone Encounter (Signed)
Pt is aware of below message and voiced her understanding. Rx for Kimberly [name] has been sent to preferred pharmacy. Nothing further is needed.

## 2017-09-13 NOTE — Telephone Encounter (Signed)
Called and spoke with pt.  Pt is requesting Rx for Tessalon. Pt states she developed dry cough 2 weeks ago. Denies additional symptoms.  RA please advise. Thanks

## 2017-09-14 ENCOUNTER — Other Ambulatory Visit: Payer: Self-pay | Admitting: Internal Medicine

## 2017-09-14 DIAGNOSIS — J45991 Cough variant asthma: Secondary | ICD-10-CM

## 2017-09-15 ENCOUNTER — Telehealth: Payer: Self-pay | Admitting: Internal Medicine

## 2017-09-15 ENCOUNTER — Other Ambulatory Visit: Payer: Self-pay | Admitting: Family Medicine

## 2017-09-15 DIAGNOSIS — J45991 Cough variant asthma: Secondary | ICD-10-CM

## 2017-09-15 MED ORDER — BENZONATATE 200 MG PO CAPS: 200.0000 mg | ORAL_CAPSULE | Freq: Three times a day (TID) | ORAL | 0 refills | Status: DC | PRN

## 2017-09-15 NOTE — Telephone Encounter (Signed)
Pt states that tessalon was not sent to pharmacy on 3/20 as ordered.  Per chart it was printed and not escribed.  Resent rx.  Pt aware.  Nothing further needed.

## 2017-09-15 NOTE — Telephone Encounter (Signed)
Left message to give me a call back concerning medication refill follow-up.

## 2017-09-18 NOTE — Telephone Encounter (Signed)
Tried calling patient several times concerning medication and appointment with Dr. Martinique. Patient never called back, but I see she is scheduled with Dr. Volanda Napoleon.

## 2017-10-16 ENCOUNTER — Ambulatory Visit: Payer: BLUE CROSS/BLUE SHIELD | Admitting: Family Medicine

## 2017-10-16 ENCOUNTER — Encounter: Payer: Self-pay | Admitting: Family Medicine

## 2017-10-16 VITALS — BP 128/62 | HR 73 | Temp 97.3°F | Wt 197.6 lb

## 2017-10-16 DIAGNOSIS — E782 Mixed hyperlipidemia: Secondary | ICD-10-CM | POA: Diagnosis not present

## 2017-10-16 DIAGNOSIS — Z0000 Encounter for general adult medical examination without abnormal findings: Secondary | ICD-10-CM | POA: Diagnosis not present

## 2017-10-16 DIAGNOSIS — I10 Essential (primary) hypertension: Secondary | ICD-10-CM

## 2017-10-16 DIAGNOSIS — E049 Nontoxic goiter, unspecified: Secondary | ICD-10-CM

## 2017-10-16 DIAGNOSIS — E11649 Type 2 diabetes mellitus with hypoglycemia without coma: Secondary | ICD-10-CM

## 2017-10-16 LAB — CBC WITH DIFFERENTIAL/PLATELET
BASOS PCT: 0.4 % (ref 0.0–3.0)
Basophils Absolute: 0 10*3/uL (ref 0.0–0.1)
EOS PCT: 3 % (ref 0.0–5.0)
Eosinophils Absolute: 0.2 10*3/uL (ref 0.0–0.7)
HCT: 40.8 % (ref 36.0–46.0)
Hemoglobin: 13.7 g/dL (ref 12.0–15.0)
LYMPHS ABS: 1.6 10*3/uL (ref 0.7–4.0)
Lymphocytes Relative: 27.7 % (ref 12.0–46.0)
MCHC: 33.6 g/dL (ref 30.0–36.0)
MCV: 85.6 fl (ref 78.0–100.0)
MONO ABS: 0.5 10*3/uL (ref 0.1–1.0)
MONOS PCT: 8.8 % (ref 3.0–12.0)
Neutro Abs: 3.4 10*3/uL (ref 1.4–7.7)
Neutrophils Relative %: 60.1 % (ref 43.0–77.0)
Platelets: 192 10*3/uL (ref 150.0–400.0)
RBC: 4.77 Mil/uL (ref 3.87–5.11)
RDW: 14.1 % (ref 11.5–15.5)
WBC: 5.6 10*3/uL (ref 4.0–10.5)

## 2017-10-16 LAB — BASIC METABOLIC PANEL
BUN: 15 mg/dL (ref 6–23)
CHLORIDE: 101 meq/L (ref 96–112)
CO2: 29 mEq/L (ref 19–32)
Calcium: 9.5 mg/dL (ref 8.4–10.5)
Creatinine, Ser: 0.8 mg/dL (ref 0.40–1.20)
GFR: 92.86 mL/min (ref >60.00)
Glucose, Bld: 251 mg/dL — ABNORMAL HIGH (ref 70–99)
POTASSIUM: 3.9 meq/L (ref 3.5–5.1)
SODIUM: 139 meq/L (ref 135–145)

## 2017-10-16 LAB — LIPID PANEL
CHOLESTEROL: 207 mg/dL — AB (ref 0–200)
HDL: 49.4 mg/dL (ref >39.00)
NONHDL: 158.02
TRIGLYCERIDES: 217 mg/dL — AB (ref 0.0–149.0)
Total CHOL/HDL Ratio: 4
VLDL: 43.4 mg/dL — ABNORMAL HIGH (ref 0.0–40.0)

## 2017-10-16 LAB — TSH: TSH: 1.25 u[IU]/mL (ref 0.35–4.50)

## 2017-10-16 LAB — HEMOGLOBIN A1C: HEMOGLOBIN A1C: 11.1 % — AB (ref 4.6–6.5)

## 2017-10-16 LAB — T4, FREE: FREE T4: 0.82 ng/dL (ref 0.60–1.60)

## 2017-10-16 LAB — LDL CHOLESTEROL, DIRECT: LDL DIRECT: 126 mg/dL

## 2017-10-16 NOTE — Patient Instructions (Signed)
Preventive Care 40-64 Years, Female Preventive care refers to lifestyle choices and visits with your health care provider that can promote health and wellness. What does preventive care include?  A yearly physical exam. This is also called an annual well check.  Dental exams once or twice a year.  Routine eye exams. Ask your health care provider how often you should have your eyes checked.  Personal lifestyle choices, including: ? Daily care of your teeth and gums. ? Regular physical activity. ? Eating a healthy diet. ? Avoiding tobacco and drug use. ? Limiting alcohol use. ? Practicing safe sex. ? Taking low-dose aspirin daily starting at age 58. ? Taking vitamin and mineral supplements as recommended by your health care provider. What happens during an annual well check? The services and screenings done by your health care provider during your annual well check will depend on your age, overall health, lifestyle risk factors, and family history of disease. Counseling Your health care provider may ask you questions about your:  Alcohol use.  Tobacco use.  Drug use.  Emotional well-being.  Home and relationship well-being.  Sexual activity.  Eating habits.  Work and work Statistician.  Method of birth control.  Menstrual cycle.  Pregnancy history.  Screening You may have the following tests or measurements:  Height, weight, and BMI.  Blood pressure.  Lipid and cholesterol levels. These may be checked every 5 years, or more frequently if you are over 81 years old.  Skin check.  Lung cancer screening. You may have this screening every year starting at age 78 if you have a 30-pack-year history of smoking and currently smoke or have quit within the past 15 years.  Fecal occult blood test (FOBT) of the stool. You may have this test every year starting at age 65.  Flexible sigmoidoscopy or colonoscopy. You may have a sigmoidoscopy every 5 years or a colonoscopy  every 10 years starting at age 30.  Hepatitis C blood test.  Hepatitis B blood test.  Sexually transmitted disease (STD) testing.  Diabetes screening. This is done by checking your blood sugar (glucose) after you have not eaten for a while (fasting). You may have this done every 1-3 years.  Mammogram. This may be done every 1-2 years. Talk to your health care provider about when you should start having regular mammograms. This may depend on whether you have a family history of breast cancer.  BRCA-related cancer screening. This may be done if you have a family history of breast, ovarian, tubal, or peritoneal cancers.  Pelvic exam and Pap test. This may be done every 3 years starting at age 80. Starting at age 36, this may be done every 5 years if you have a Pap test in combination with an HPV test.  Bone density scan. This is done to screen for osteoporosis. You may have this scan if you are at high risk for osteoporosis.  Discuss your test results, treatment options, and if necessary, the need for more tests with your health care provider. Vaccines Your health care provider may recommend certain vaccines, such as:  Influenza vaccine. This is recommended every year.  Tetanus, diphtheria, and acellular pertussis (Tdap, Td) vaccine. You may need a Td booster every 10 years.  Varicella vaccine. You may need this if you have not been vaccinated.  Zoster vaccine. You may need this after age 5.  Measles, mumps, and rubella (MMR) vaccine. You may need at least one dose of MMR if you were born in  1957 or later. You may also need a second dose.  Pneumococcal 13-valent conjugate (PCV13) vaccine. You may need this if you have certain conditions and were not previously vaccinated.  Pneumococcal polysaccharide (PPSV23) vaccine. You may need one or two doses if you smoke cigarettes or if you have certain conditions.  Meningococcal vaccine. You may need this if you have certain  conditions.  Hepatitis A vaccine. You may need this if you have certain conditions or if you travel or work in places where you may be exposed to hepatitis A.  Hepatitis B vaccine. You may need this if you have certain conditions or if you travel or work in places where you may be exposed to hepatitis B.  Haemophilus influenzae type b (Hib) vaccine. You may need this if you have certain conditions.  Talk to your health care provider about which screenings and vaccines you need and how often you need them. This information is not intended to replace advice given to you by your health care provider. Make sure you discuss any questions you have with your health care provider. Document Released: 07/10/2015 Document Revised: 03/02/2016 Document Reviewed: 04/14/2015 Elsevier Interactive Patient Education  2018 Elsevier Inc.  Diabetes Mellitus and Nutrition When you have diabetes (diabetes mellitus), it is very important to have healthy eating habits because your blood sugar (glucose) levels are greatly affected by what you eat and drink. Eating healthy foods in the appropriate amounts, at about the same times every day, can help you:  Control your blood glucose.  Lower your risk of heart disease.  Improve your blood pressure.  Reach or maintain a healthy weight.  Every person with diabetes is different, and each person has different needs for a meal plan. Your health care provider may recommend that you work with a diet and nutrition specialist (dietitian) to make a meal plan that is best for you. Your meal plan may vary depending on factors such as:  The calories you need.  The medicines you take.  Your weight.  Your blood glucose, blood pressure, and cholesterol levels.  Your activity level.  Other health conditions you have, such as heart or kidney disease.  How do carbohydrates affect me? Carbohydrates affect your blood glucose level more than any other type of food. Eating  carbohydrates naturally increases the amount of glucose in your blood. Carbohydrate counting is a method for keeping track of how many carbohydrates you eat. Counting carbohydrates is important to keep your blood glucose at a healthy level, especially if you use insulin or take certain oral diabetes medicines. It is important to know how many carbohydrates you can safely have in each meal. This is different for every person. Your dietitian can help you calculate how many carbohydrates you should have at each meal and for snack. Foods that contain carbohydrates include:  Bread, cereal, rice, pasta, and crackers.  Potatoes and corn.  Peas, beans, and lentils.  Milk and yogurt.  Fruit and juice.  Desserts, such as cakes, cookies, ice cream, and candy.  How does alcohol affect me? Alcohol can cause a sudden decrease in blood glucose (hypoglycemia), especially if you use insulin or take certain oral diabetes medicines. Hypoglycemia can be a life-threatening condition. Symptoms of hypoglycemia (sleepiness, dizziness, and confusion) are similar to symptoms of having too much alcohol. If your health care provider says that alcohol is safe for you, follow these guidelines:  Limit alcohol intake to no more than 1 drink per day for nonpregnant women and 2   drinks per day for men. One drink equals 12 oz of beer, 5 oz of wine, or 1 oz of hard liquor.  Do not drink on an empty stomach.  Keep yourself hydrated with water, diet soda, or unsweetened iced tea.  Keep in mind that regular soda, juice, and other mixers may contain a lot of sugar and must be counted as carbohydrates.  What are tips for following this plan? Reading food labels  Start by checking the serving size on the label. The amount of calories, carbohydrates, fats, and other nutrients listed on the label are based on one serving of the food. Many foods contain more than one serving per package.  Check the total grams (g) of  carbohydrates in one serving. You can calculate the number of servings of carbohydrates in one serving by dividing the total carbohydrates by 15. For example, if a food has 30 g of total carbohydrates, it would be equal to 2 servings of carbohydrates.  Check the number of grams (g) of saturated and trans fats in one serving. Choose foods that have low or no amount of these fats.  Check the number of milligrams (mg) of sodium in one serving. Most people should limit total sodium intake to less than 2,300 mg per day.  Always check the nutrition information of foods labeled as "low-fat" or "nonfat". These foods may be higher in added sugar or refined carbohydrates and should be avoided.  Talk to your dietitian to identify your daily goals for nutrients listed on the label. Shopping  Avoid buying canned, premade, or processed foods. These foods tend to be high in fat, sodium, and added sugar.  Shop around the outside edge of the grocery store. This includes fresh fruits and vegetables, bulk grains, fresh meats, and fresh dairy. Cooking  Use low-heat cooking methods, such as baking, instead of high-heat cooking methods like deep frying.  Cook using healthy oils, such as olive, canola, or sunflower oil.  Avoid cooking with butter, cream, or high-fat meats. Meal planning  Eat meals and snacks regularly, preferably at the same times every day. Avoid going long periods of time without eating.  Eat foods high in fiber, such as fresh fruits, vegetables, beans, and whole grains. Talk to your dietitian about how many servings of carbohydrates you can eat at each meal.  Eat 4-6 ounces of lean protein each day, such as lean meat, chicken, fish, eggs, or tofu. 1 ounce is equal to 1 ounce of meat, chicken, or fish, 1 egg, or 1/4 cup of tofu.  Eat some foods each day that contain healthy fats, such as avocado, nuts, seeds, and fish. Lifestyle   Check your blood glucose regularly.  Exercise at least  30 minutes 5 or more days each week, or as told by your health care provider.  Take medicines as told by your health care provider.  Do not use any products that contain nicotine or tobacco, such as cigarettes and e-cigarettes. If you need help quitting, ask your health care provider.  Work with a Social worker or diabetes educator to identify strategies to manage stress and any emotional and social challenges. What are some questions to ask my health care provider?  Do I need to meet with a diabetes educator?  Do I need to meet with a dietitian?  What number can I call if I have questions?  When are the best times to check my blood glucose? Where to find more information:  American Diabetes Association: diabetes.org/food-and-fitness/food  Academy  of Nutrition and Dietetics: PokerClues.dk  [employer] of Diabetes and Digestive and Kidney Diseases (NIH): ContactWire.be Summary  A healthy meal plan will help you control your blood glucose and maintain a healthy lifestyle.  Working with a diet and nutrition specialist (dietitian) can help you make a meal plan that is best for you.  Keep in mind that carbohydrates and alcohol have immediate effects on your blood glucose levels. It is important to count carbohydrates and to use alcohol carefully. This information is not intended to replace advice given to you by your health care provider. Make sure you discuss any questions you have with your health care provider. Document Released: 03/10/2005 Document Revised: 07/18/2016 Document Reviewed: 07/18/2016 Elsevier Interactive Patient Education  [employer].

## 2017-10-16 NOTE — Progress Notes (Signed)
Subjective:    Patient ID: Kimberly [name], female    DOB: 1955-09-27, 62 y.o.   MRN: [id number]  No chief complaint on file.   HPI Patient was seen today to transfer care from Dr. Martinique and for CPE.  DM: -pt taking janumet (sitagliptin-metformin 50-1000mg ) BID and lantus 60 units qhs -pt does not consistently check her fsbs -pt states she has not been eating as well as she could b/c of the recent Easter holiday and her birthday. -pt states she was followed in the past by Endo. -pt does some exercise at the gym at her apt complex -pt endorses drinking 6-8 bottles of water/day  HTN: -pt does not check her bp regularly, though she has a cuff at home -thinks it was 120/80 the last time she checked. -pt states she likes to eat vegetables. -taking HCTZ 25 mg and norvasc 5 mg daily/    Past Medical History:  Diagnosis Date  . Diabetes mellitus 2001  . Hyperlipidemia Dx 2012  . Hypertension 2001    Allergies  Allergen Reactions  . Ace Inhibitors Cough    ROS General: Denies fever, chills, night sweats, changes in weight, changes in appetite HEENT: Denies headaches, ear pain, changes in vision, rhinorrhea, sore throat  +h/o goiter CV: Denies CP, palpitations, SOB, orthopnea Pulm: Denies SOB, cough, wheezing GI: Denies abdominal pain, nausea, vomiting, diarrhea, constipation GU: Denies dysuria, hematuria, frequency, vaginal discharge Msk: Denies muscle cramps, joint pains Neuro: Denies weakness, numbness, tingling Skin: Denies rashes, bruising Psych: Denies depression, anxiety, hallucinations     Objective:    Blood pressure 128/62, pulse 73, temperature (!) 97.3 F (36.3 C), temperature source Oral, weight 197 lb 9.6 oz (89.6 kg), SpO2 95 %.   Gen. Pleasant, well-nourished, in no distress, normal affect   HEENT: [place]/AT, face symmetric, no scleral icterus, arcus senilis, PERRLA,nares patent without drainage, pharynx without erythema or exudate. Neck: No JVD,  thyromegaly, no carotid bruits Lungs: no accessory muscle use, CTAB, no wheezes or rales Cardiovascular: RRR, no m/r/g, no peripheral edema Abdomen: BS present, soft, NT/ND Neuro:  A&Ox3, CN II-XII intact, normal gait Skin:  Warm, no lesions/ rash   Wt Readings from Last 3 Encounters:  10/16/17 197 lb 9.6 oz (89.6 kg)  03/07/17 197 lb (89.4 kg)  01/27/17 190 lb 6 oz (86.4 kg)    Lab Results  Component Value Date   WBC 5.6 10/05/2016   HGB 14.0 10/05/2016   HCT 42.4 10/05/2016   PLT 189.0 10/05/2016   GLUCOSE 302 (H) 10/05/2016   CHOL 184 05/18/2016   TRIG 180 (A) 05/18/2016   HDL 45 05/18/2016   LDLDIRECT 153 (H) 05/14/2012   LDLCALC 103 05/18/2016   ALT 35 10/05/2016   AST 26 10/05/2016   NA 138 10/05/2016   K 3.7 10/05/2016   CL 99 10/05/2016   CREATININE 0.84 10/05/2016   BUN 17 10/05/2016   CO2 30 10/05/2016   TSH 1.37 10/05/2016   HGBA1C 12.5 01/27/2017   MICROALBUR 5.5 (H) 08/21/2014    Assessment/Plan:  Well adult exam  -Anticipatory guidance given including wearing seatbelts, smoke detectors in the home, increasing physical activity, increasing p.o. intake of water. -Pap up to date  -pt encouraged to schedule mammogram - Plan: CBC with Differential/Platelet, CBC with Differential/Platelet  Uncontrolled type 2 diabetes mellitus with hypoglycemia without coma ([place])  -Discussed importance of better diabetic control and possible consequences from uncontrolled blood sugars. -continue lantus 60 units qhs and janumet 50-1000 bid. - Plan:  Hemoglobin A1c, Ambulatory referral to Ophthalmology  Essential hypertension  -continue norvasc 5 mg and HCTZ 25 mg daily -pt encouraged to check bp at home and keep a log. -lifestyle modifications encouraged. - Plan: Basic metabolic panel  Goiter  -thyroid u/s done last yr. - Plan: TSH, T4, Free  Mixed hyperlipidemia  -continue simvastatin 25 mg daily - Plan: Lipid panel  F/u in the next 2-3 months, sooner if  needed  Grier Mitts, MD

## 2017-10-18 ENCOUNTER — Encounter: Payer: Self-pay | Admitting: Family Medicine

## 2017-10-18 ENCOUNTER — Telehealth: Payer: Self-pay | Admitting: Family Medicine

## 2017-10-18 NOTE — Telephone Encounter (Signed)
Copied from [place] [id number]. Topic: Quick Communication - See Telephone Encounter >> Oct 18, 2017  9:43 AM Cleaster Corin, NT wrote: CRM for notification. See Telephone encounter for: 10/18/17. Pt./ calling to have nurse or Dr. Daphane Shepherd over lab results received them on my chart but needs clarification. Pt. Also  Calling to request refill but stated that Dr. Volanda Napoleon should know what medication she needs a refill on. Wasn't able to send request due to pt. Not know the name of medication. Offered to send request pt. Sated that she will just come by the office.

## 2017-10-19 ENCOUNTER — Telehealth: Payer: Self-pay | Admitting: Family Medicine

## 2017-10-19 ENCOUNTER — Other Ambulatory Visit: Payer: Self-pay | Admitting: Family Medicine

## 2017-10-19 DIAGNOSIS — E785 Hyperlipidemia, unspecified: Secondary | ICD-10-CM

## 2017-10-19 DIAGNOSIS — I10 Essential (primary) hypertension: Secondary | ICD-10-CM

## 2017-10-19 MED ORDER — SITAGLIPTIN PHOS-METFORMIN HCL 50-1000 MG PO TABS: 1.0000 | ORAL_TABLET | Freq: Two times a day (BID) | ORAL | 1 refills | Status: DC

## 2017-10-19 MED ORDER — AMLODIPINE BESYLATE 5 MG PO TABS: 5.0000 mg | ORAL_TABLET | Freq: Every day | ORAL | 1 refills | Status: DC

## 2017-10-19 MED ORDER — SIMVASTATIN 20 MG PO TABS: 20.0000 mg | ORAL_TABLET | Freq: Every day | ORAL | 1 refills | Status: DC

## 2017-10-19 MED ORDER — HYDROCHLOROTHIAZIDE 25 MG PO TABS: 25.0000 mg | ORAL_TABLET | Freq: Every day | ORAL | 1 refills | Status: DC

## 2017-10-19 MED ORDER — INSULIN GLARGINE 100 UNIT/ML ~~LOC~~ SOLN: 60.0000 [IU] | Freq: Every day | SUBCUTANEOUS | 11 refills | Status: DC

## 2017-10-19 NOTE — Telephone Encounter (Signed)
Medication refills were e-scribed to [address]

## 2017-10-19 NOTE — Telephone Encounter (Signed)
Please see other telephone note

## 2017-10-19 NOTE — Telephone Encounter (Signed)
I can not call pt, the number listed in the chart states " not accepting calls at this time".

## 2017-10-19 NOTE — Telephone Encounter (Unsigned)
Copied from [place] [phone]. Topic: Quick Communication - See Telephone Encounter >> Oct 19, 2017  3:03 PM Hewitt Shorts wrote: CRM for notification. See Telephone encounter for: 10/19/17.pt is needing a refill on hydrochlorothiazide , simvastatin, amlodipine Janumet  and lantus   [facility] [address] [phone] Best number [phone]   And also would like a call back from someone regarding her lab work -she sees it on my chart but has questions

## 2017-10-25 ENCOUNTER — Ambulatory Visit: Payer: BLUE CROSS/BLUE SHIELD | Admitting: Family Medicine

## 2017-10-27 ENCOUNTER — Other Ambulatory Visit: Payer: Self-pay | Admitting: *Deleted

## 2017-10-27 ENCOUNTER — Other Ambulatory Visit: Payer: Self-pay

## 2017-10-27 MED ORDER — INSULIN GLARGINE 100 UNITS/ML SOLOSTAR PEN: 100.0000 [IU] | PEN_INJECTOR | Freq: Every day | SUBCUTANEOUS | 11 refills | Status: DC

## 2017-10-27 MED ORDER — INSULIN GLARGINE 100 UNIT/ML SOLOSTAR PEN: 60.0000 [IU] | PEN_INJECTOR | Freq: Every day | SUBCUTANEOUS | 11 refills | Status: DC

## 2017-10-27 NOTE — Telephone Encounter (Signed)
Patient requested Lantus pen instead of vial. Medication filled to pharmacy as requested.

## 2017-10-29 ENCOUNTER — Other Ambulatory Visit: Payer: Self-pay | Admitting: Internal Medicine

## 2017-11-03 ENCOUNTER — Telehealth: Payer: Self-pay | Admitting: Family Medicine

## 2017-11-03 NOTE — Telephone Encounter (Signed)
Left message on machine for patient to return our call.  CRM created 

## 2017-11-03 NOTE — Telephone Encounter (Signed)
Directions for Janumet please

## 2017-11-03 NOTE — Telephone Encounter (Signed)
Called patient and left message to return call. Need clarification on janumet dose due to patient statements in phone note contradicting last note encounter. If patient takes one tab daily, 90 tabs is correct for a 90 day supply, if she takes it twice, than we need to resubmit. Thanks!

## 2017-11-03 NOTE — Telephone Encounter (Signed)
Patient called in to question the Janumet dose, she usually takes this only once a day and requested a 90 day supply to [facility]. She is upset that correct amount was not sent in to pharmacy, says she does not want to pay again for another script. Please call patient to discuss and confirm correct recommended dosage.

## 2017-11-03 NOTE — Telephone Encounter (Signed)
Per office not pt was on janumet 50-1000.  Take 1 tab bid.  Ok to give 90 day supply with 2 refills.

## 2017-11-06 ENCOUNTER — Encounter (INDEPENDENT_AMBULATORY_CARE_PROVIDER_SITE_OTHER): Payer: Self-pay | Admitting: Ophthalmology

## 2017-11-07 ENCOUNTER — Telehealth: Payer: Self-pay | Admitting: Family Medicine

## 2017-11-07 MED ORDER — SITAGLIP PHOS-METFORMIN HCL ER 50-1000 MG PO TB24: 1.0000 | ORAL_TABLET | Freq: Every day | ORAL | 1 refills | Status: DC

## 2017-11-07 NOTE — Telephone Encounter (Signed)
Medication filled to pharmacy as requested.   

## 2017-11-07 NOTE — Telephone Encounter (Addendum)
Patient returned call. She states she is taking Janumet XR daily, instead of plain Janumet BID.  Okay to send rx for Janumet XR 50-1000 mg daily?

## 2017-11-07 NOTE — Telephone Encounter (Signed)
That's fine

## 2017-11-07 NOTE — Telephone Encounter (Signed)
Copied from [place] [phone]. Topic: Quick Communication - See Telephone Encounter >> Nov 07, 2017 12:11 PM Conception Chancy, NT wrote: CRM for notification. See Telephone encounter for: 11/07/17.  [facility] is calling and needing clarification on sitaGLIPtin-metformin (JANUMET) 50-1000 MG tablet. Please contact pharmacy. At [phone]

## 2017-11-07 NOTE — Telephone Encounter (Signed)
See 11/03/2017 phone note.

## 2018-01-09 ENCOUNTER — Encounter (INDEPENDENT_AMBULATORY_CARE_PROVIDER_SITE_OTHER): Payer: Self-pay | Admitting: Ophthalmology

## 2018-01-10 ENCOUNTER — Ambulatory Visit: Payer: BLUE CROSS/BLUE SHIELD | Admitting: Family Medicine

## 2018-01-12 ENCOUNTER — Encounter (INDEPENDENT_AMBULATORY_CARE_PROVIDER_SITE_OTHER): Payer: Self-pay | Admitting: Ophthalmology

## 2018-01-25 ENCOUNTER — Telehealth: Payer: Self-pay | Admitting: Family Medicine

## 2018-01-25 NOTE — Telephone Encounter (Signed)
Copied from [place] [phone]. Topic: Inquiry >> Jan 25, 2018 10:30 AM Pricilla Handler wrote: Reason for CRM: Patient called stating that she needs the disposible needles 30 G for her Insulin pens. Patient also states that she needs Lancets and Test Strips for her Contour Net 1 glucose meter. Please call the patient once refills have been sent.   Thank You!!!

## 2018-01-25 NOTE — Telephone Encounter (Signed)
Copied from [place] [phone]. Topic: Appointment Scheduling - Scheduling Inquiry for Clinic >> Jan 25, 2018 11:28 AM Conception Chancy, NT wrote: Reason for CRM: patient is wanting to switch her care back to Dr. Martinique from Dr. Volanda Napoleon. She was a previous patient of Dr. Martinique but switched to Dr. Volanda Napoleon, patient states she can never get in to see Dr. Volanda Napoleon and she is never available at the times she needs her. I informed patient I would send this transfer request and we would contact her when it is finalized. Please advise.

## 2018-01-25 NOTE — Telephone Encounter (Signed)
Copied from [place] [id number]. Topic: Referral - Request >> Jan 25, 2018 11:13 AM Bea Graff, NT wrote: Reason for CRM: Pt would like to be referred to Dr. Cruzita Lederer, endocrinologist for her diabetes. Phone: [phone]

## 2018-01-26 ENCOUNTER — Ambulatory Visit: Payer: BLUE CROSS/BLUE SHIELD | Admitting: Family Medicine

## 2018-01-26 ENCOUNTER — Other Ambulatory Visit: Payer: Self-pay | Admitting: *Deleted

## 2018-01-26 NOTE — Telephone Encounter (Signed)
I recommend considering other providers in this office, some are still taking new patients.  Thanks, BJ

## 2018-01-29 ENCOUNTER — Other Ambulatory Visit: Payer: Self-pay | Admitting: Family Medicine

## 2018-01-29 DIAGNOSIS — E1165 Type 2 diabetes mellitus with hyperglycemia: Principal | ICD-10-CM

## 2018-01-29 DIAGNOSIS — IMO0001 Reserved for inherently not codable concepts without codable children: Secondary | ICD-10-CM

## 2018-01-29 MED ORDER — LANCETS THIN MISC: 5 refills | Status: DC

## 2018-01-29 MED ORDER — GLUCOSE BLOOD VI STRP: ORAL_STRIP | 12 refills | Status: DC

## 2018-01-29 MED ORDER — PEN NEEDLES 30G X 8 MM MISC: 1.0000 | Freq: Every day | 5 refills | Status: DC

## 2018-01-29 NOTE — Telephone Encounter (Signed)
Left message informing patient that Rx's were sent to the pharmacy.

## 2018-01-29 NOTE — Telephone Encounter (Signed)
Referral placed.  Also ok for pt to transfer providers.

## 2018-01-30 ENCOUNTER — Ambulatory Visit: Payer: BLUE CROSS/BLUE SHIELD | Admitting: Family Medicine

## 2018-01-31 ENCOUNTER — Telehealth: Payer: Self-pay | Admitting: Family Medicine

## 2018-01-31 NOTE — Telephone Encounter (Signed)
Copied from [place] [phone]. Topic: Quick Communication - Rx Refill/Question >> Jan 31, 2018  2:32 PM Scherrie Gerlach wrote: Medication:Insulin Pen Needle (PEN NEEDLES) 30G X 8 MM MISC (wrong needle called in)  Caleb with [facility] calling to request pt be switched back the  Pen Needles 32 G X 4 MM needle she used to use.  [facility] [address] [place] [phone] (Phone) [phone] (Fax)

## 2018-01-31 NOTE — Telephone Encounter (Signed)
Called pt left a detail message regarding the referral request and the ok to transfer to another provider.

## 2018-02-01 NOTE — Telephone Encounter (Signed)
LMTCB Dr. Martinique is not able to accept pt back at this time. If pt still wishes to transfer to another provider, a transfer request would need to be initiated with any other provider accepting New Pt's. As of now the choices would be Dorothyann Peng, NP and Dr. Micheline Rough.

## 2018-02-01 NOTE — Telephone Encounter (Signed)
Copied from [place] [phone]. Topic: General - Other >> Feb 01, 2018 12:56 PM Alfredia Ferguson R wrote: [facility] asking to switch pt Insulin Pen Needle (PEN NEEDLES) 30G X 8 MM MISC to 32g x 71mm due to the other being on back order

## 2018-02-02 ENCOUNTER — Ambulatory Visit: Payer: BLUE CROSS/BLUE SHIELD | Admitting: Family Medicine

## 2018-02-02 ENCOUNTER — Encounter: Payer: Self-pay | Admitting: Family Medicine

## 2018-02-02 VITALS — BP 126/84 | HR 74 | Temp 98.3°F | Resp 12 | Ht 71.0 in | Wt 190.1 lb

## 2018-02-02 DIAGNOSIS — M791 Myalgia, unspecified site: Secondary | ICD-10-CM

## 2018-02-02 DIAGNOSIS — E785 Hyperlipidemia, unspecified: Secondary | ICD-10-CM

## 2018-02-02 DIAGNOSIS — E1140 Type 2 diabetes mellitus with diabetic neuropathy, unspecified: Secondary | ICD-10-CM

## 2018-02-02 DIAGNOSIS — T466X5A Adverse effect of antihyperlipidemic and antiarteriosclerotic drugs, initial encounter: Secondary | ICD-10-CM

## 2018-02-02 DIAGNOSIS — R0781 Pleurodynia: Secondary | ICD-10-CM

## 2018-02-02 DIAGNOSIS — Z794 Long term (current) use of insulin: Secondary | ICD-10-CM

## 2018-02-02 DIAGNOSIS — I10 Essential (primary) hypertension: Secondary | ICD-10-CM | POA: Diagnosis not present

## 2018-02-02 LAB — URINALYSIS, ROUTINE W REFLEX MICROSCOPIC
BILIRUBIN URINE: NEGATIVE
KETONES UR: NEGATIVE
LEUKOCYTES UA: NEGATIVE
NITRITE: NEGATIVE
Specific Gravity, Urine: 1.025 (ref 1.000–1.030)
Total Protein, Urine: 30 — AB
UROBILINOGEN UA: 0.2 (ref 0.0–1.0)
Urine Glucose: 100 — AB
pH: 5.5 (ref 5.0–8.0)

## 2018-02-02 LAB — POCT GLYCOSYLATED HEMOGLOBIN (HGB A1C): HEMOGLOBIN A1C: 11.8 % — AB (ref 4.0–5.6)

## 2018-02-02 MED ORDER — SITAGLIP PHOS-METFORMIN HCL ER 50-1000 MG PO TB24: 1.0000 | ORAL_TABLET | Freq: Every day | ORAL | 1 refills | Status: DC

## 2018-02-02 NOTE — Assessment & Plan Note (Signed)
She did not tolerate statin medication. She is not interested in trying other options. Benefits of statins discussed. Recommend discussing with PCP other options.

## 2018-02-02 NOTE — Patient Instructions (Addendum)
A few things to remember from today's visit:   Type 2 diabetes mellitus with diabetic neuropathy, with long-term current use of insulin ([place]) - Plan: POCT glycosylated hemoglobin (Hb A1C), SitaGLIPtin-MetFORMIN HCl (JANUMET XR) 50-1000 MG TB24  Costal margin pain - Plan: Urinalysis, Routine w reflex microscopic  Myalgia due to statin  I think site pain is musculoskeletal. Please keep appointment with endocrinologist. Janumet XR was sent to your pharmacy. No changes in Lantus.   In the future you might consider trying a different cholesterol medication. For now continue low-fat diet.  Please be sure medication list is accurate. If a new problem present, please set up appointment sooner than planned today.

## 2018-02-02 NOTE — Assessment & Plan Note (Signed)
BP adequately controlled. No changes in current management. Eye exam is due. Continue low salt diet. F/U in 5-6 months with PCP.

## 2018-02-02 NOTE — Progress Notes (Signed)
HPI:  Chief Complaint  Patient presents with  . Follow-up    diabetes follow-up    Kimberly [name] is a 62 y.o. female, who is here today to follow on DM 2. Her PCP was not available today.   History of DM 2 with neuropathy.  She was already referred to endocrinologist, she requested to see Dr. Cruzita Lederer. She has appointment with endocrinologist on 03/09/2018.   Lab Results  Component Value Date   HGBA1C 11.1 (H) 10/16/2017   Denies abdominal pain, nausea,vomiting, or polydipsia. + Polyuria, or polyphagia.  She is not taking sitagliptin-Metformin 50-1000 mg daily, she was on Janumet XR and tolerated well. She is on Lantus 60 units daily  She is not checking BS's. Last eye exam a year ago,she cancelled appt and planning on rescheduling it.  Today she is c/o pain on left side,she points at the waist and under rib cage. She has had pain for a couple months. It is intermittent,sharp,5/10. Sometimes pain lasts all day. She is not sure about exacerbating or alleviating factors but adds that it is interfering with sleep because husts when lying on left side. No hx of trauma. She is concerned about possible renal issues causing pain. She has not had dysuria,foam in urine, or gross hematuria. + Frequency but she is drinking more water.  Lab Results  Component Value Date   CREATININE 0.80 10/16/2017   BUN 15 10/16/2017   NA 139 10/16/2017   K 3.9 10/16/2017   CL 101 10/16/2017   CO2 29 10/16/2017    No fever,chills,nausea,vomiting,abdominal pain,or changes in bowel habits. She is not exercising regularly and has not been consistent with a healthy diet.  She states that she is "feeling good."  HLD:  Lab Results  Component Value Date   CHOL 207 (H) 10/16/2017   HDL 49.40 10/16/2017   LDLCALC 103 05/18/2016   LDLDIRECT 126.0 10/16/2017   TRIG 217.0 (H) 10/16/2017   CHOLHDL 4 10/16/2017   She discontinued Simvastatin because it was causing leg  cramps. She has been on Crestor in the past. She has not been consistent with low fat diet.   Review of Systems  Constitutional: Negative for activity change, appetite change, fatigue and fever.  HENT: Negative for mouth sores, nosebleeds and trouble swallowing.   Eyes: Negative for redness and visual disturbance.  Respiratory: Negative for cough, shortness of breath and wheezing.   Cardiovascular: Negative for chest pain, palpitations and leg swelling.  Gastrointestinal: Negative for abdominal pain, nausea and vomiting.       Negative for changes in bowel habits.  Endocrine: Negative for polydipsia, polyphagia and polyuria.  Genitourinary: Negative for decreased urine volume, dysuria and hematuria.  Musculoskeletal: Positive for myalgias. Negative for gait problem.  Skin: Negative for rash and wound.  Neurological: Negative for syncope, weakness and headaches.  Psychiatric/Behavioral: Negative for confusion. The patient is nervous/anxious.       Current Outpatient Medications on File Prior to Visit  Medication Sig Dispense Refill  . amLODipine (NORVASC) 5 MG tablet Take 1 tablet (5 mg total) by mouth daily. 90 tablet 1  . benzonatate (TESSALON) 200 MG capsule Take 1 capsule (200 mg total) by mouth 3 (three) times daily as needed for cough. 90 capsule 0  . budesonide-formoterol (SYMBICORT) 160-4.5 MCG/ACT inhaler Inhale 2 puffs into the lungs 2 (two) times daily. 1 Inhaler 0  . ECHINACEA PO Take 760 mg by mouth 2 (two) times daily.    Marland Kitchen glucose  blood (CONTOUR NEXT TEST) test strip Use as instructed 100 each 12  . hydrochlorothiazide (HYDRODIURIL) 25 MG tablet Take 1 tablet (25 mg total) by mouth daily. 90 tablet 1  . Insulin Glargine (LANTUS) 100 UNIT/ML Solostar Pen Inject 60 Units into the skin at bedtime. 15 mL 11  . Insulin Pen Needle (PEN NEEDLES) 30G X 8 MM MISC 1 each by Does not apply route daily. 100 each 5  . Korean Panax Ginseng 100 MG CAPS Take 100 mg by mouth.    .  Lancets Thin MISC USE TO TEST BLOOD SUGAR DAILY 100 each 5  . simvastatin (ZOCOR) 20 MG tablet Take 1 tablet (20 mg total) by mouth at bedtime. (Patient not taking: Reported on 02/02/2018) 90 tablet 1   No current facility-administered medications on file prior to visit.      Past Medical History:  Diagnosis Date  . Diabetes mellitus 2001  . Hyperlipidemia Dx 2012  . Hypertension 2001   Allergies  Allergen Reactions  . Ace Inhibitors Cough    Social History   Socioeconomic History  . Marital status: Divorced    Spouse name: Not on file  . Number of children: Not on file  . Years of education: Not on file  . Highest education level: Not on file  Occupational History  . Not on file  Social Needs  . Financial resource strain: Not on file  . Food insecurity:    Worry: Not on file    Inability: Not on file  . Transportation needs:    Medical: Not on file    Non-medical: Not on file  Tobacco Use  . Smoking status: Never Smoker  . Smokeless tobacco: Never Used  Substance and Sexual Activity  . Alcohol use: Yes    Comment: occ  . Drug use: No  . Sexual activity: Not on file  Lifestyle  . Physical activity:    Days per week: Not on file    Minutes per session: Not on file  . Stress: Not on file  Relationships  . Social connections:    Talks on phone: Not on file    Gets together: Not on file    Attends religious service: Not on file    Active member of club or organization: Not on file    Attends meetings of clubs or organizations: Not on file    Relationship status: Not on file  Other Topics Concern  . Not on file  Social History Narrative  . Not on file    Vitals:   02/02/18 0905  BP: 126/84  Pulse: 74  Resp: 12  Temp: 98.3 F (36.8 C)  SpO2: 98%   Body mass index is 26.52 kg/m.   Physical Exam  Nursing note and vitals reviewed. Constitutional: She is oriented to person, place, and time. She appears well-developed. No distress.  HENT:  Head:  Normocephalic and atraumatic.  Mouth/Throat: Oropharynx is clear and moist and mucous membranes are normal.  Eyes: Pupils are equal, round, and reactive to light. Conjunctivae are normal.  Cardiovascular: Normal rate and regular rhythm.  No murmur heard. Pulses:      Dorsalis pedis pulses are 2+ on the right side, and 2+ on the left side.  Respiratory: Effort normal and breath sounds normal. No respiratory distress.  GI: Soft. She exhibits no mass. There is no hepatomegaly. There is no tenderness.  Musculoskeletal: She exhibits no edema.  Lymphadenopathy:    She has no cervical adenopathy.  Neurological: She  is alert and oriented to person, place, and time. She has normal strength. No cranial nerve deficit. Gait normal.  Skin: Skin is warm. No rash noted. No erythema.  Psychiatric: She has a normal mood and affect.  Well groomed, good eye contact.      ASSESSMENT AND PLAN:  Ms. Karon was seen today for follow-up.  Diagnoses and all orders for this visit:  Costal margin pain  I think it is musculoskeletal. Not reproducible today. OTC Icy hot or asper cream with lidocaine may help. Monitor for warning signs.  -     Urinalysis, Routine w reflex microscopic   DM type 2 (diabetes mellitus, type 2) HgA1C is not at goal,poorly controlled. Some complications from poorly controlled DM discussed. She agrees with resuming Janumet XR 50-1000 mg. No changes in Lantus. Regular exercise and healthy diet with avoidance of added sugar food intake is an important part of treatment and recommended. Annual eye exam and foot care recommended. Keep appt with Dr Cruzita Lederer.   Hypertension BP adequately controlled. No changes in current management. Eye exam is due. Continue low salt diet. F/U in 5-6 months with PCP.  Hyperlipidemia She did not tolerate statin medication. She is not interested in trying other options. Benefits of statins discussed. Recommend discussing with PCP other  options.  Myalgia due to statin Resolved after discontinuing Simvastatin. Crestor also caused some myalgias in the past. She could try taking medication a couple times per week at least.     Return if symptoms worsen or fail to improve.      Jaivion Kingsley G. [place], MD  [facility]. [place] office.

## 2018-02-02 NOTE — Telephone Encounter (Signed)
Called pt left a message to return call in the office regarding the change in her insulin needles from her pharmacy.

## 2018-02-02 NOTE — Assessment & Plan Note (Signed)
HgA1C is not at goal,poorly controlled. Some complications from poorly controlled DM discussed. She agrees with resuming Janumet XR 50-1000 mg. No changes in Lantus. Regular exercise and healthy diet with avoidance of added sugar food intake is an important part of treatment and recommended. Annual eye exam and foot care recommended. Keep appt with Dr Cruzita Lederer.

## 2018-02-02 NOTE — Assessment & Plan Note (Signed)
Resolved after discontinuing Simvastatin. Crestor also caused some myalgias in the past. She could try taking medication a couple times per week at least.

## 2018-02-05 ENCOUNTER — Encounter: Payer: Self-pay | Admitting: Family Medicine

## 2018-02-05 NOTE — Telephone Encounter (Signed)
Spoke with Loletha Grayer with [facility], state that the pen needles that pt currently uses are on back order, pharmacy is not sure when they will be delivered, gave verbal orders for the requested size which pt used at one point, called pt and left a detailed message and requested to contact her pharmacy if have any questions.

## 2018-03-09 ENCOUNTER — Ambulatory Visit: Payer: BLUE CROSS/BLUE SHIELD | Admitting: Internal Medicine

## 2018-03-09 ENCOUNTER — Encounter: Payer: Self-pay | Admitting: Internal Medicine

## 2018-03-09 VITALS — BP 136/78 | HR 79 | Ht 71.0 in | Wt 188.2 lb

## 2018-03-09 DIAGNOSIS — E1142 Type 2 diabetes mellitus with diabetic polyneuropathy: Secondary | ICD-10-CM | POA: Diagnosis not present

## 2018-03-09 DIAGNOSIS — Z23 Encounter for immunization: Secondary | ICD-10-CM

## 2018-03-09 DIAGNOSIS — E1165 Type 2 diabetes mellitus with hyperglycemia: Secondary | ICD-10-CM

## 2018-03-09 MED ORDER — SEMAGLUTIDE(0.25 OR 0.5MG/DOS) 2 MG/1.5ML ~~LOC~~ SOPN: 0.5000 mg | PEN_INJECTOR | SUBCUTANEOUS | 5 refills | Status: DC

## 2018-03-09 NOTE — Patient Instructions (Addendum)
Please continue: - Lantus 60 units at bedtime  Please increase: - Janumet ER 50-1000 mg to 2x a day After you finish JanuMet, let me know so I can send Metformin ER to your pharmacy.  Please start Ozempic 0.25 mg weekly in a.m. (for example on Sunday morning) x 4 weeks, then increase to 0.5 mg weekly in a.m. if no nausea or hypoglycemia.  Please come back for a follow-up appointment in 3 months.  PATIENT INSTRUCTIONS FOR TYPE 2 DIABETES:  **Please join MyChart!** - see attached instructions about how to join if you have not done so already.  DIET AND EXERCISE Diet and exercise is an important part of diabetic treatment.  We recommended aerobic exercise in the form of brisk walking (working between 40-60% of maximal aerobic capacity, similar to brisk walking) for 150 minutes per week (such as 30 minutes five days per week) along with 3 times per week performing 'resistance' training (using various gauge rubber tubes with handles) 5-10 exercises involving the major muscle groups (upper body, lower body and core) performing 10-15 repetitions (or near fatigue) each exercise. Start at half the above goal but build slowly to reach the above goals. If limited by weight, joint pain, or disability, we recommend daily walking in a swimming pool with water up to waist to reduce pressure from joints while allow for adequate exercise.    BLOOD GLUCOSES Monitoring your blood glucoses is important for continued management of your diabetes. Please check your blood glucoses 2-4 times a day: fasting, before meals and at bedtime (you can rotate these measurements - e.g. one day check before the 3 meals, the next day check before 2 of the meals and before bedtime, etc.).   HYPOGLYCEMIA (low blood sugar) Hypoglycemia is usually a reaction to not eating, exercising, or taking too much insulin/ other diabetes drugs.  Symptoms include tremors, sweating, hunger, confusion, headache, etc. Treat IMMEDIATELY with 15  grams of Carbs: . 4 glucose tablets .  cup regular juice/soda . 2 tablespoons raisins . 4 teaspoons sugar . 1 tablespoon honey Recheck blood glucose in 15 mins and repeat above if still symptomatic/blood glucose <100.  RECOMMENDATIONS TO REDUCE YOUR RISK OF DIABETIC COMPLICATIONS: * Take your prescribed MEDICATION(S) * Follow a DIABETIC diet: Complex carbs, fiber rich foods, (monounsaturated and polyunsaturated) fats * AVOID saturated/trans fats, high fat foods, >2,300 mg salt per day. * EXERCISE at least 5 times a week for 30 minutes or preferably daily.  * DO NOT SMOKE OR DRINK more than 1 drink a day. * Check your FEET every day. Do not wear tightfitting shoes. Contact us if you develop an ulcer * See your EYE doctor once a year or more if needed * Get a FLU shot once a year * Get a PNEUMONIA vaccine once before and once after age 46 years  GOALS:  * Your Hemoglobin A1c of <7%  * fasting sugars need to be <130 * after meals sugars need to be <180 (2h after you start eating) * Your Systolic BP should be 109 or lower  * Your Diastolic BP should be 80 or lower  * Your HDL (Good Cholesterol) should be 40 or higher  * Your LDL (Bad Cholesterol) should be 100 or lower. * Your Triglycerides should be 150 or lower  * Your Urine microalbumin (kidney function) should be <30 * Your Body Mass Index should be 25 or lower    Please consider the following ways to cut down carbs and fat and  increase fiber and micronutrients in your diet: - substitute whole grain for white bread or pasta - substitute brown rice for white rice - substitute 90-calorie flat bread pieces for slices of bread when possible - substitute sweet potatoes or yams for white potatoes - substitute humus for margarine - substitute tofu for cheese when possible - substitute almond or rice milk for regular milk (would not drink soy milk daily due to concern for soy estrogen influence on breast cancer risk) - substitute  dark chocolate for other sweets when possible - substitute water - can add lemon or orange slices for taste - for diet sodas (artificial sweeteners will trick your body that you can eat sweets without getting calories and will lead you to overeating and weight gain in the long run) - do not skip breakfast or other meals (this will slow down the metabolism and will result in more weight gain over time)  - can try smoothies made from fruit and almond/rice milk in am instead of regular breakfast - can also try old-fashioned (not instant) oatmeal made with almond/rice milk in am - order the dressing on the side when eating salad at a restaurant (pour less than half of the dressing on the salad) - eat as little meat as possible - can try juicing, but should not forget that juicing will get rid of the fiber, so would alternate with eating raw veg./fruits or drinking smoothies - use as little oil as possible, even when using olive oil - can dress a salad with a mix of balsamic vinegar and lemon juice, for e.g. - use agave nectar, stevia sugar, or regular sugar rather than artificial sweateners - steam or broil/roast veggies  - snack on veggies/fruit/nuts (unsalted, preferably) when possible, rather than processed foods - reduce or eliminate aspartame in diet (it is in diet sodas, chewing gum, etc) Read the labels!  Try to read Dr. Janene Harvey book: "Program for Reversing Diabetes" for other ideas for healthy eating.

## 2018-03-09 NOTE — Progress Notes (Signed)
Patient ID: Kimberly [name], female   DOB: [birth year], 62 y.o.   MRN: [id number]   HPI: Kimberly [name] is a 62 y.o.-year-old female, referred by her PCP, Dr. Volanda Napoleon, for management of DM2, dx in ~2006, insulin-dependent since 2015, uncontrolled, with complications (PN). She saw Dr. Jeanann Lewandowsky before, before he retired.  Reviewed hemoglobin A1c levels: Lab Results  Component Value Date   HGBA1C 11.8 (A) 02/02/2018   HGBA1C 11.1 (H) 10/16/2017   HGBA1C 12.5 01/27/2017   HGBA1C 11.3 05/18/2016   HGBA1C 10.30 01/06/2015   HGBA1C 11.40 08/21/2014   HGBA1C 9.5 04/30/2014   HGBA1C 11.2 02/24/2014   HGBA1C 9.3 11/28/2013   HGBA1C 9.3 07/27/2013   Pt is on a regimen of: - Lantus 60 units at bedtime - Janumet ER 50-1000 mg 1x a day, with dinner -she did not tolerate the regular Janumet well, but is tolerating Janumet XR  Tried  Pt is not checking sugars: - am: n/c - 2h after b'fast: n/c - before lunch: n/c - 2h after lunch: n/c - before dinner: n/c - 2h after dinner: n/c - bedtime: n/c - nighttime: n/c Lowest sugar was 99; she has hypoglycemia awareness at 70s.  Highest sugar was 220.  Glucometer: [employer]  Pt's meals are: - Breakfast: oatmeal, fruit, bagel or leftovers - Lunch: salad or vegetable soup - Dinner: chicken, fish; broccoli, spinach - Snacks: 3 snack, watermelon, nuts, fruit  - no CKD, last BUN/creatinine:  Lab Results  Component Value Date   BUN 15 10/16/2017   BUN 17 10/05/2016   CREATININE 0.80 10/16/2017   CREATININE 0.84 10/05/2016  She is allergic to ACE inhibitors.  -+ HL; last set of lipids: Lab Results  Component Value Date   CHOL 207 (H) 10/16/2017   HDL 49.40 10/16/2017   LDLCALC 103 05/18/2016   LDLDIRECT 126.0 10/16/2017   TRIG 217.0 (H) 10/16/2017   CHOLHDL 4 10/16/2017  On Zocor 20 >> stopped b/c leg cramps.  - last eye exam was in 2016. No DR reportedly.   -+ Numbness and tingling in her feet.  Pt has FH of DM in  mother.  ROS: Constitutional: no weight gain/loss, + fatigue, no subjective hyperthermia/hypothermia, + nocturia, + poor sleep Eyes: no blurry vision, no xerophthalmia ENT: no sore throat, no nodules palpated in throat, no dysphagia/odynophagia, no hoarseness Cardiovascular: no CP/SOB/palpitations/leg swelling Respiratory: no cough/SOB Gastrointestinal: no N/V/D/C Musculoskeletal: no muscle/joint aches Skin: no rashes Neurological: no tremors/numbness/tingling/dizziness Psychiatric: no depression/anxiety  Past Medical History:  Diagnosis Date  . Diabetes mellitus 2001  . Hyperlipidemia Dx 2012  . Hypertension 2001   Past Surgical History:  Procedure Laterality Date  . ABDOMINAL HYSTERECTOMY     Social History   Socioeconomic History  . Marital status: Divorced    Spouse name: Not on file  . Number of children: 1  . Years of education: Not on file  . Highest education level: Not on file  Occupational History  . Not on file  Social Needs  . Financial resource strain: Not on file  . Food insecurity:    Worry: Not on file    Inability: Not on file  . Transportation needs:    Medical: Not on file    Non-medical: Not on file  Tobacco Use  . Smoking status: Never Smoker  . Smokeless tobacco: Never Used  Substance and Sexual Activity  . Alcohol use: Yes    Comment: occ  . Drug use: No  . Sexual activity: Not  on file  Lifestyle  . Physical activity:    Days per week: Not on file    Minutes per session: Not on file  . Stress: Not on file  Relationships  . Social connections:    Talks on phone: Not on file    Gets together: Not on file    Attends religious service: Not on file    Active member of club or organization: Not on file    Attends meetings of clubs or organizations: Not on file    Relationship status: Not on file  . Intimate partner violence:    Fear of current or ex partner: Not on file    Emotionally abused: Not on file    Physically abused: Not on  file    Forced sexual activity: Not on file  Other Topics Concern  . Not on file  Social History Narrative  . Not on file   Current Outpatient Medications on File Prior to Visit  Medication Sig Dispense Refill  . amLODipine (NORVASC) 5 MG tablet Take 1 tablet (5 mg total) by mouth daily. 90 tablet 1  . budesonide-formoterol (SYMBICORT) 160-4.5 MCG/ACT inhaler Inhale 2 puffs into the lungs 2 (two) times daily. 1 Inhaler 0  . ECHINACEA PO Take 760 mg by mouth 2 (two) times daily.    Marland Kitchen glucose blood (CONTOUR NEXT TEST) test strip Use as instructed 100 each 12  . hydrochlorothiazide (HYDRODIURIL) 25 MG tablet Take 1 tablet (25 mg total) by mouth daily. 90 tablet 1  . Insulin Glargine (LANTUS) 100 UNIT/ML Solostar Pen Inject 60 Units into the skin at bedtime. 15 mL 11  . Insulin Pen Needle (PEN NEEDLES) 30G X 8 MM MISC 1 each by Does not apply route daily. 100 each 5  . Korean Panax Ginseng 100 MG CAPS Take 100 mg by mouth.    . Lancets Thin MISC USE TO TEST BLOOD SUGAR DAILY 100 each 5  . simvastatin (ZOCOR) 20 MG tablet Take 1 tablet (20 mg total) by mouth at bedtime. 90 tablet 1  . SitaGLIPtin-MetFORMIN HCl (JANUMET XR) 50-1000 MG TB24 Take 1 tablet by mouth daily. 90 tablet 1   No current facility-administered medications on file prior to visit.    Allergies  Allergen Reactions  . Ace Inhibitors Cough   Family History  Problem Relation Age of Onset  . Depression Mother   . Hypertension Mother   . Heart disease Father   . Cancer Sister   . Cancer Brother     PE: BP 136/78   Pulse 79   Ht 5\' 11"  (1.803 m)   Wt 188 lb 3.2 oz (85.4 kg)   SpO2 96%   BMI 26.25 kg/m  Wt Readings from Last 3 Encounters:  03/09/18 188 lb 3.2 oz (85.4 kg)  02/02/18 190 lb 2 oz (86.2 kg)  10/16/17 197 lb 9.6 oz (89.6 kg)   Constitutional: overweight, in NAD Eyes: PERRLA, EOMI, no exophthalmos ENT: moist mucous membranes, no thyromegaly, no cervical lymphadenopathy Cardiovascular: RRR, No  MRG Respiratory: CTA B Gastrointestinal: abdomen soft, NT, ND, BS+ Musculoskeletal: no deformities, strength intact in all 4 Skin: moist, warm, no rashes Neurological: no tremor with outstretched hands, DTR normal in all 4  ASSESSMENT: 1. DM2, insulin-dependent, uncontrolled, with long-term complications - PN  PLAN:  1. Patient with long-standing, uncontrolled diabetes, on oral antidiabetic regimen, which became insufficient. Pt has had HbA1c levels for a long time now, latest being 11.8% last month.  I explained that this  will result in diabetes complications: Affecting her cardiovascular system, eyes, kidneys, and nerves.  I strongly recommended that we start to gain control of her diabetes to avoid the above complications.  She is not checking sugars now, unfortunately, and I strongly advised her to start.  She is very reticent to add another medication to her regimen for now, however, I explained that we do not have much choice.  I will also refer her to nutrition and improving her diet will be key, but for now, I strongly advised her to add a GLP-1 receptor agonist to her regimen.  Since she has Janumet XR at home, I will have her continue this for now and I advised her to let me know when she runs out so I can call in only metformin ER for her.  We will increase the dose of Janumet XR for now. - Discussed about Ozempic: Mechanism of action, benefits, possible side effects.  We will started a low dose and advance as tolerated.  Given coupon card. - I suggested to:  Patient Instructions  Please continue: - Lantus 60 units at bedtime  Please increase: - Janumet ER 50-1000 mg to 2x a day After you finish JanuMet, let me know so I can send Metformin ER to your pharmacy.  Please start Ozempic 0.25 mg weekly in a.m. (for example on Sunday morning) x 4 weeks, then increase to 0.5 mg weekly in a.m. if no nausea or hypoglycemia.  Please come back for a follow-up appointment in 3 months.  -  Strongly advised her to start checking sugars at different times of the day - check 2x a day, rotating checks - given sugar log and advised how to fill it and to bring it at next appt  - given foot care handout and explained the principles  - given instructions for hypoglycemia management "15-15 rule"  - advised for yearly eye exams >> she is due for another one - Return to clinic in 3 mo with sugar log   Philemon Kingdom, MD PhD [facility] Endocrinology

## 2018-03-28 ENCOUNTER — Telehealth: Payer: Self-pay | Admitting: Internal Medicine

## 2018-03-28 NOTE — Telephone Encounter (Signed)
Pt is calling back [phone]

## 2018-03-28 NOTE — Telephone Encounter (Signed)
Called and spoke with pt who was requesting a refill of her benzonatate.  Stated to pt we needed to schedule her for an OV due to it being a year since we have seen her.   Pt expressed understanding. Stated to pt to bring all meds with her to OV so MW can reassess what meds she is taking. Pt expressed understanding.  Pt has been scheduled for an OV with MW Tuesday, 10/8 at 11:45 and made a note that pt was told to bring all meds with her to appt.   Nothing further needed.

## 2018-03-28 NOTE — Telephone Encounter (Signed)
Attempted to call pt but no answer.  Left message for pt to return call x1 

## 2018-04-03 ENCOUNTER — Ambulatory Visit (INDEPENDENT_AMBULATORY_CARE_PROVIDER_SITE_OTHER): Payer: BLUE CROSS/BLUE SHIELD | Admitting: Internal Medicine

## 2018-04-03 ENCOUNTER — Encounter: Payer: Self-pay | Admitting: Internal Medicine

## 2018-04-03 VITALS — BP 130/70 | HR 75 | Ht 72.0 in | Wt 192.4 lb

## 2018-04-03 DIAGNOSIS — J45991 Cough variant asthma: Secondary | ICD-10-CM

## 2018-04-03 MED ORDER — BUDESONIDE-FORMOTEROL FUMARATE 160-4.5 MCG/ACT IN AERO: 2.0000 | INHALATION_SPRAY | Freq: Two times a day (BID) | RESPIRATORY_TRACT | 11 refills | Status: DC

## 2018-04-03 MED ORDER — BENZONATATE 200 MG PO CAPS: 200.0000 mg | ORAL_CAPSULE | Freq: Three times a day (TID) | ORAL | 11 refills | Status: DC | PRN

## 2018-04-03 MED ORDER — BUDESONIDE-FORMOTEROL FUMARATE 160-4.5 MCG/ACT IN AERO: 2.0000 | INHALATION_SPRAY | Freq: Two times a day (BID) | RESPIRATORY_TRACT | 0 refills | Status: DC

## 2018-04-03 NOTE — Patient Instructions (Addendum)
For any cough/ wheeze/short of breath start back on symbicort 160 2 pffs every 12 hours and taper slowly off - the 80 strength may work just as well if we establish you are happy with the 160 first   Work on inhaler technique:  relax and gently blow all the way out then take a nice smooth deep breath back in, triggering the inhaler at same time you start breathing in.  Hold for up to 5 seconds if you can. Blow out thru nose. Rinse and gargle with water when done       For cough > tessalon every 8 hours if needed     If you are satisfied with your treatment plan,  let your doctor know and he/she can either refill your medications or you can return here when your prescription runs out.     If in any way you are not 100% satisfied,  please tell us.  If 100% better, tell your friends!  Pulmonary follow up is as needed

## 2018-04-03 NOTE — Progress Notes (Signed)
Subjective:     Patient ID: Kimberly [name], female   DOB: [birth year],  MRN: [id number]     Brief patient profile:  4   yobf never smoker healthy as child but onset in her 72's with rhinitis/cough/sob > allergist in [place] in her 20's dx as allergic to grass, given breathing treatments but poor recall of details but apparently got better enough, moved to [place] around 2001 got worse around 2015 and improved p started on symbicort but cough  failed to improve on gerd rx and referred to pulmonary clinic 10/05/2016 by Dr   Betty Martinique    History of Present Illness  10/05/2016 1st [facility] office visit/ Kimberly [name]   Chief Complaint  Patient presents with  . Pulmonary Consult    Referred by Dr. Betty Martinique. Pt states she had been coughing for "years", until started on Symbicort inhaler 3 months ago. She states once she started Symbicort, her cough has basically resolved. She had very minimal dry cough this am.    cough tended to be worse at hs and during the day if got around strong smells but improved on symbicort 160 and so stopped gerd rx and has not flared "does this mean I have asthma?" - note hfa quite poor so not clear how much she's actually getting rec Continue symbicort 160 Take 2 puffs first thing in am and then another 2 puffs about 12 hours later.  But when you finish it up try the symbiocrt 80 dose instead same number of doses per day Work on inhaler technique:   Please remember to go to the lab department downstairs in the basement  for your tests - we will call you with the results when they are available.     03/07/2017 acute extended ov/Kimberly [name] re: recurring coughing x decades,  now worse x 2 weeks Chief Complaint  Patient presents with  . Acute Visit    Increased cough x 2 wks- prod with white sputum. She states her chest feels sore at times.  cough recurrent x decades pattern is that it goes away completely for months at a time then while on symb 80 2bid and no gerd rx  flaired s obvious trigger x 2 weeks Cough as bad day vs noct assoc with gen ant chest discomfort during coughing fits Pharmacy confirmed she's refilling her symb 160, not the 80 as rec though hfa not optimal - see a/p rec symbicort 160 Take 2 puffs first thing in am and then another 2 puffs about 12 hours later.  As needed for cough >  Tessalon 200 mg every 6-8 hours and supplement by adding tramadol 50 mg one every 4 hours though this may make you sleepy GERD diet  If not satisfied return here with all medications in hand in 2 weeks to start the cough work up from scratch      04/03/2018  Acute ov/Kimberly [name] re:  Cough variant asthma Chief Complaint  Patient presents with  . Acute Visit    Increased cough x 2 wks- non prod  Dyspnea:  Not limited by breathing from desired activities   Cough:     For the last 2 weeks was much  worse so re-started symb and tessalon and improved gradually  Sleeping bed flat SABA use: none    No obvious day to day or daytime variability or assoc excess/ purulent sputum or mucus plugs or hemoptysis or cp or chest tightness, subjective wheeze or overt sinus or hb symptoms.  Sleeping as above  without nocturnal  or early am exacerbation  of respiratory  c/o's or need for noct saba. Also denies any obvious fluctuation of symptoms with weather or environmental changes or other aggravating or alleviating factors except as outlined above   No unusual exposure hx or h/o childhood pna/ asthma or knowledge of premature birth.  Current Allergies, Complete Past Medical History, Past Surgical History, Family History, and Social History were reviewed in [employer] record.  ROS  The following are not active complaints unless bolded Hoarseness, sore throat, dysphagia, dental problems, itching, sneezing,  nasal congestion or discharge of excess mucus or purulent secretions, ear ache,   fever, chills, sweats, unintended wt loss or wt gain, classically  pleuritic or exertional cp,  orthopnea pnd or arm/hand swelling  or leg swelling, presyncope, palpitations, abdominal pain, anorexia, nausea, vomiting, diarrhea  or change in bowel habits or change in bladder habits, change in stools or change in urine, dysuria, hematuria,  rash, arthralgias, visual complaints, headache, numbness, weakness or ataxia or problems with walking or coordination,  change in mood or  memory.        Current Meds  Medication Sig  . amLODipine (NORVASC) 5 MG tablet Take 1 tablet (5 mg total) by mouth daily.  . budesonide-formoterol (SYMBICORT) 160-4.5 MCG/ACT inhaler Inhale 2 puffs into the lungs 2 (two) times daily.  Marland Kitchen ECHINACEA PO Take 760 mg by mouth 2 (two) times daily.  Marland Kitchen glucose blood (CONTOUR NEXT TEST) test strip Use as instructed  . hydrochlorothiazide (HYDRODIURIL) 25 MG tablet Take 1 tablet (25 mg total) by mouth daily.  . Insulin Glargine (LANTUS) 100 UNIT/ML Solostar Pen Inject 60 Units into the skin at bedtime.  . Insulin Pen Needle (PEN NEEDLES) 30G X 8 MM MISC 1 each by Does not apply route daily.  . Korean Panax Ginseng 100 MG CAPS Take 100 mg by mouth.  . Lancets Thin MISC USE TO TEST BLOOD SUGAR DAILY  . Semaglutide,0.25 or 0.5MG /DOS, (OZEMPIC, 0.25 OR 0.5 MG/DOSE,) 2 MG/1.5ML SOPN Inject 0.5 mg into the skin once a week.  . simvastatin (ZOCOR) 20 MG tablet Take 1 tablet (20 mg total) by mouth at bedtime.  . SitaGLIPtin-MetFORMIN HCl (JANUMET XR) 50-1000 MG TB24 Take 1 tablet by mouth daily.  .     .                 Objective:   Physical Exam    amb bf nad   04/03/2018        192  03/07/2017        197   10/05/16 185 lb (83.9 kg)  10/05/16 186 lb 2 oz (84.4 kg)  08/12/16 189 lb 6 oz (85.9 kg)    Vital signs reviewed - Note on arrival 02 sats  100% on RA     HEENT: nl dentition, turbinates bilaterally, and oropharynx. Nl external ear canals without cough reflex   NECK :  without JVD/Nodes/TM/ nl carotid upstrokes  bilaterally   LUNGS: no acc muscle use,  Nl contour chest which is clear to A and P bilaterally without cough on insp or exp maneuvers   CV:  RRR  no s3 or murmur or increase in P2, and no edema   ABD:  soft and nontender with nl inspiratory excursion in the supine position. No bruits or organomegaly appreciated, bowel sounds nl  MS:  Nl gait/ ext warm without deformities, calf tenderness, cyanosis or clubbing No obvious joint  restrictions   SKIN: warm and dry without lesions    NEURO:  alert, approp, nl sensorium with  no motor or cerebellar deficits apparent.            Assessment:

## 2018-04-04 ENCOUNTER — Encounter: Payer: Self-pay | Admitting: Internal Medicine

## 2018-04-04 NOTE — Assessment & Plan Note (Addendum)
-  Spirometry 10/05/2016  No obst p am symb 160 - Allergy profile 10/05/2016 >  Eos 0.2/  IgE  7 neg RAST - recurrent flare x mid to late august 2018  - 03/07/2017   > continue symb 160 2bid plus tessilon/tramadol prn  - 04/03/2018  After extensive coaching inhaler device,  effectiveness =    50%     Despite suboptimal hfa, as long as stays on symbicort apparently All goals of chronic asthma control met including optimal function and elimination of symptoms with minimal need for rescue therapy.  Contingencies discussed in full including contacting this office immediately if not controlling the symptoms using the rule of two's.      I had an extended discussion with the patient reviewing all relevant studies completed to date and  lasting 15 to 20 minutes of a 25 minute visit on the following ongoing  Issues  1) she does not like to use symb as maint rx   2 ) Based on two studies from [place]; 20 p 1865 (2018) and 380 : p2020-30 (2019) in pts with mild asthma it is reasonable to use low dose symbicort eg 80 2bid "prn" flare in this setting but I emphasized this was only shown with symbicort and takes advantage of the rapid onset of action but is not the same as "rescue therapy" but can be stopped once the acute symptoms have resolved and the need for rescue has been minimized (< 2 x weekly)    3) reviewed above and suggested if masters hfa could probably reduce symb to 80 q12h and use it as above at less cost/ convenience than maint rx   Discussed in detail all the  indications, usual  risks and alternatives  relative to the benefits with patient who agrees to proceed with rx as outlined.    Each maintenance medication was reviewed in detail including most importantly the difference between maintenance and as needed and under what circumstances the prns are to be used.  Please see AVS for specific  Instructions which are unique to this visit and I personally typed out  which were reviewed in  detail in writing with the patient and a copy provided.   See device teaching which extended face to face time for this visit     Pulmonary f/u is prn if PCP willing to renew meds

## 2018-04-06 ENCOUNTER — Ambulatory Visit: Payer: BLUE CROSS/BLUE SHIELD | Admitting: Internal Medicine

## 2018-04-20 ENCOUNTER — Other Ambulatory Visit: Payer: Self-pay | Admitting: Family Medicine

## 2018-04-20 DIAGNOSIS — I10 Essential (primary) hypertension: Secondary | ICD-10-CM

## 2018-04-26 ENCOUNTER — Ambulatory Visit: Payer: BLUE CROSS/BLUE SHIELD | Admitting: Dietician

## 2018-06-05 IMAGING — MG 2D DIGITAL SCREENING BILATERAL MAMMOGRAM WITH CAD AND ADJUNCT TO
8 of 12 series · 8 of 28 positions shown · non-contrast
Comparison: Previous exam(s).

CLINICAL DATA: Screening.

EXAM:
2D DIGITAL SCREENING BILATERAL MAMMOGRAM WITH CAD AND ADJUNCT TOMO

[R MLO]
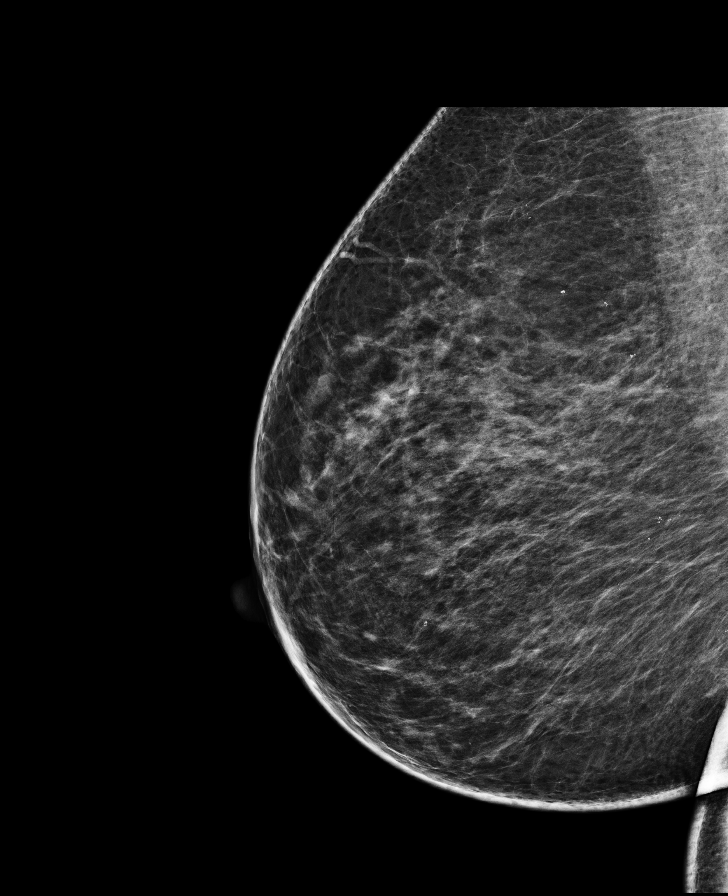

[L CC]
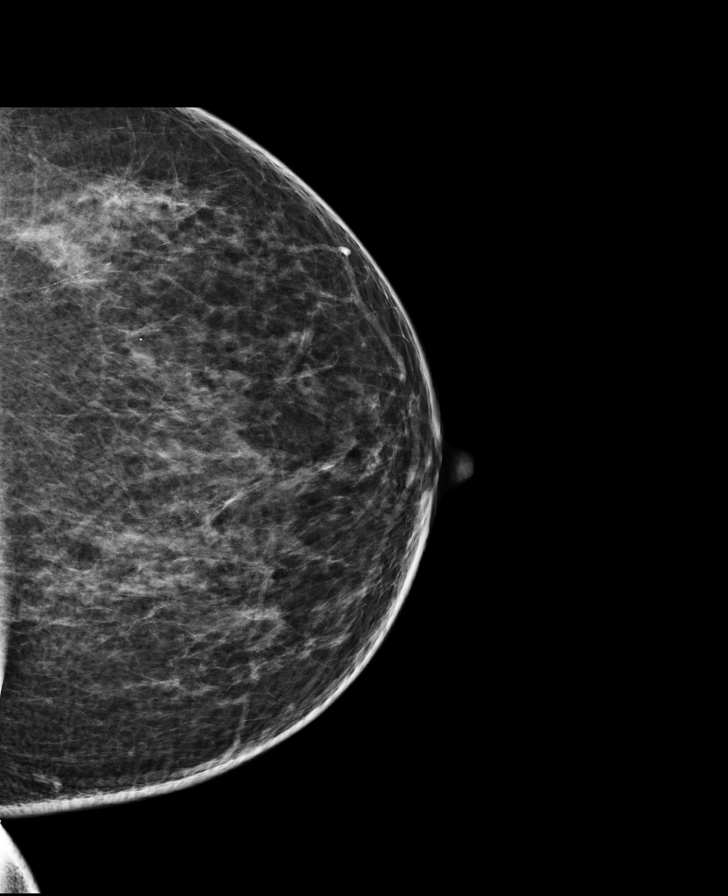

[L CC synth-2D]
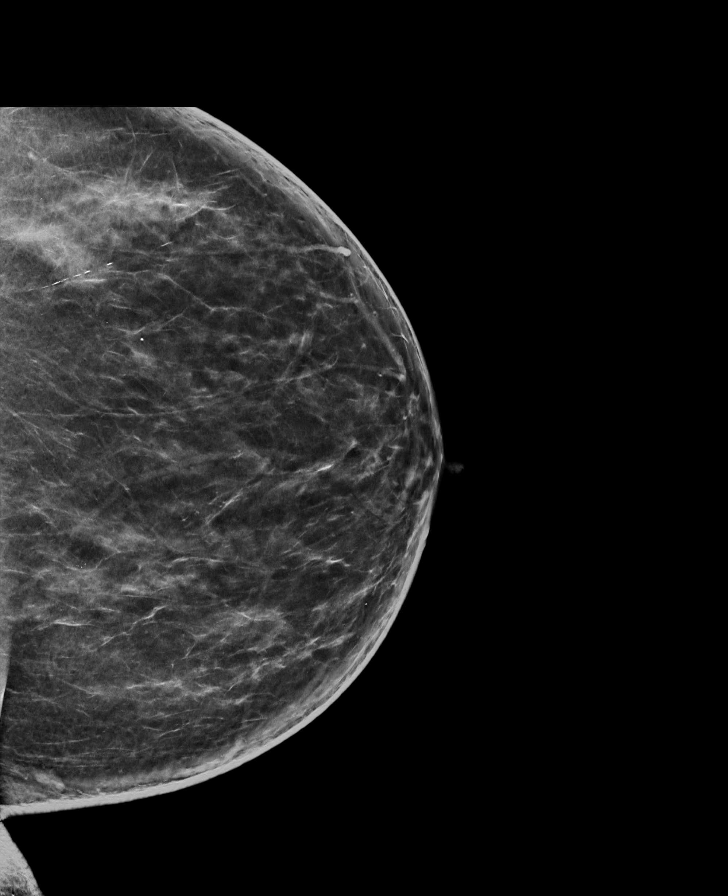

[R CC synth-2D]
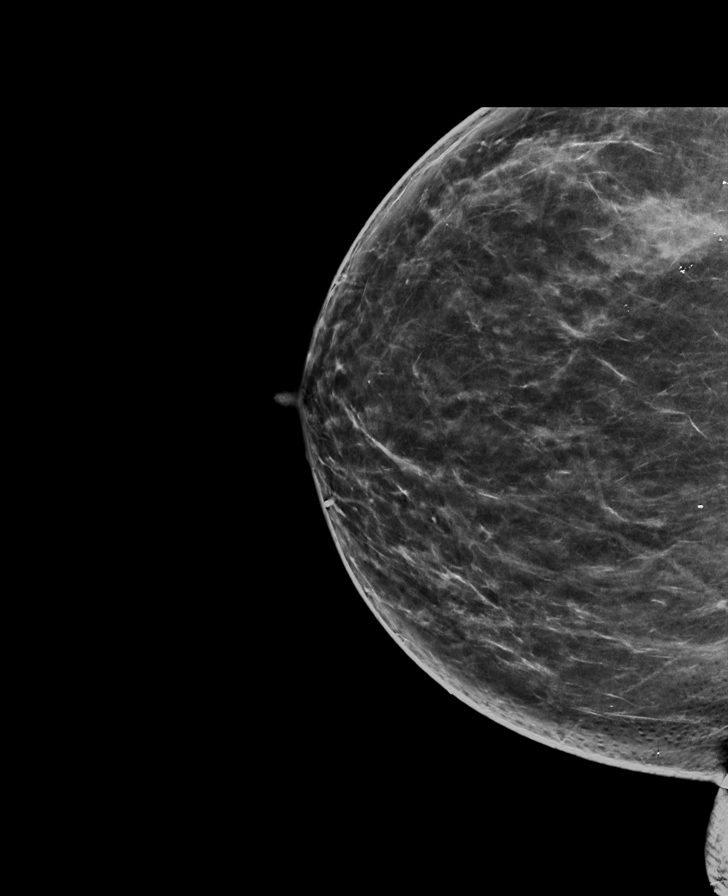

[L MLO]
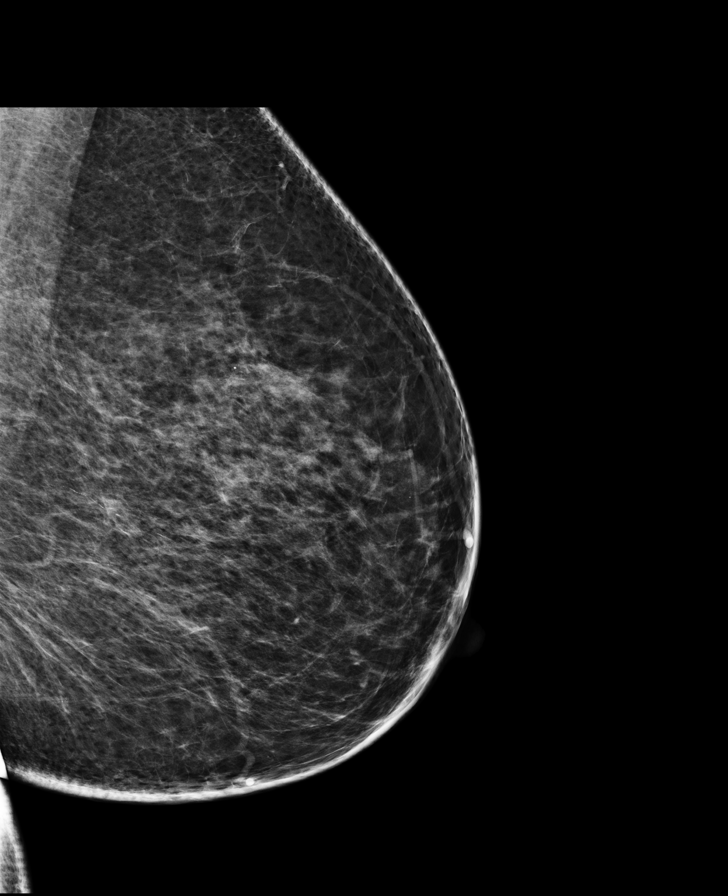

[L MLO synth-2D]
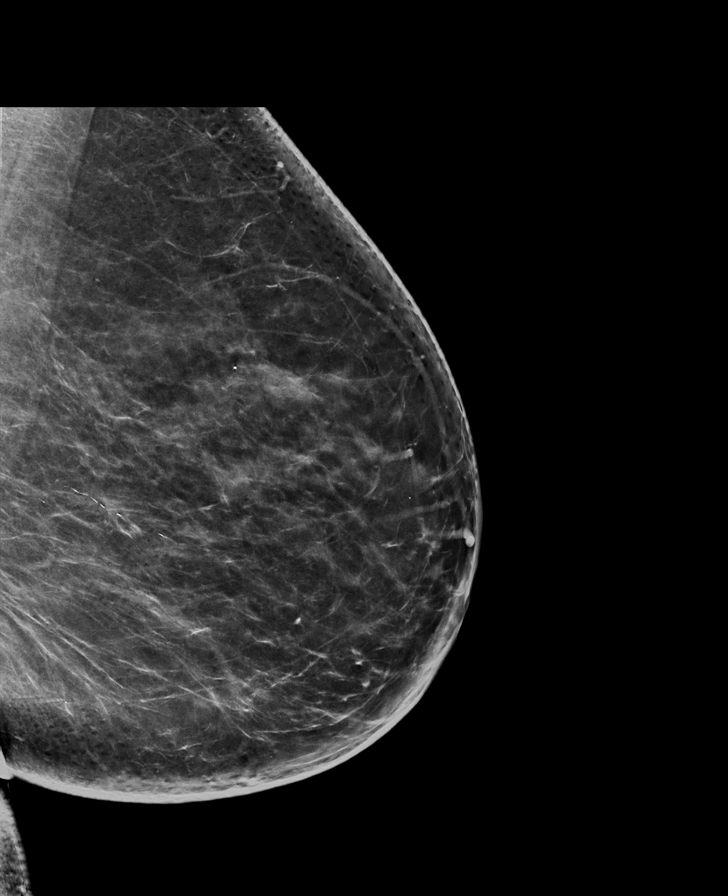

[R MLO synth-2D]
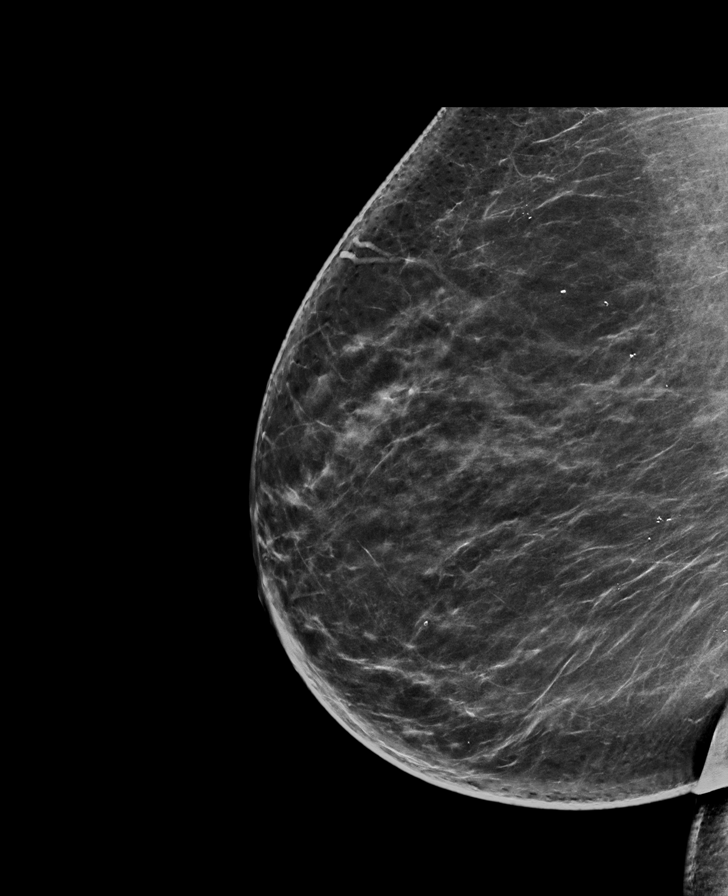

[R CC]
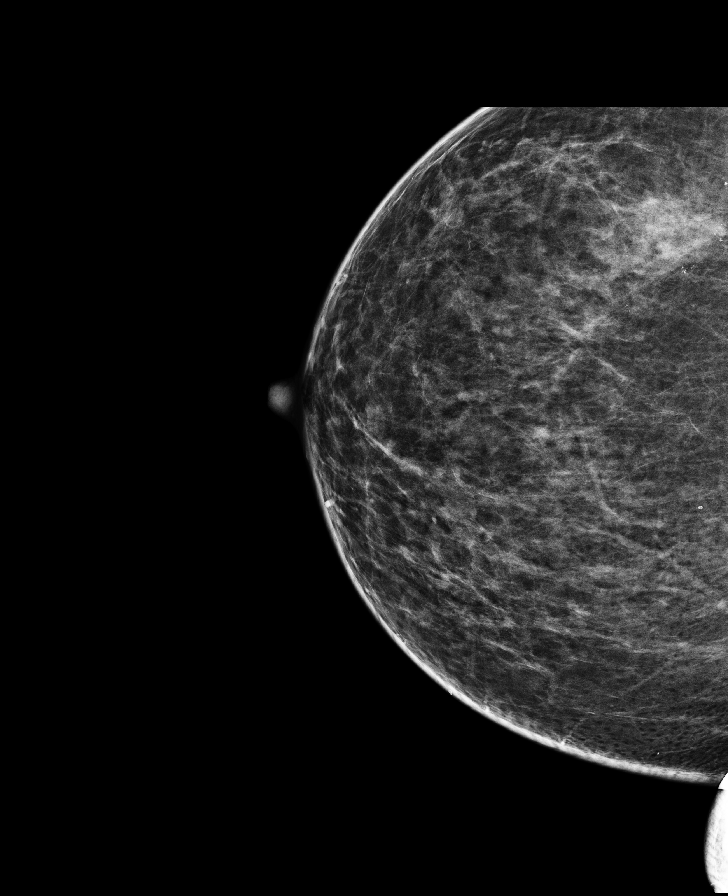

[8 of 28 positions shown; findings below may reference images not displayed]

ACR Breast Density Category c: The breast tissue is heterogeneously
dense, which may obscure small masses.
FINDINGS: There are no findings suspicious for malignancy. Images were
processed with CAD.
IMPRESSION: No mammographic evidence of malignancy. A result letter of this
screening mammogram will be mailed directly to the patient.

RECOMMENDATION:
Screening mammogram in one year. (Code:[id number])

BI-RADS CATEGORY  1: Negative.

## 2018-06-12 ENCOUNTER — Other Ambulatory Visit: Payer: Self-pay | Admitting: Family Medicine

## 2018-06-12 DIAGNOSIS — I10 Essential (primary) hypertension: Secondary | ICD-10-CM

## 2018-06-13 MED ORDER — HYDROCHLOROTHIAZIDE 25 MG PO TABS: 25.0000 mg | ORAL_TABLET | Freq: Every day | ORAL | 0 refills | Status: DC

## 2018-06-15 ENCOUNTER — Ambulatory Visit: Payer: BLUE CROSS/BLUE SHIELD | Admitting: Internal Medicine

## 2018-06-25 ENCOUNTER — Telehealth: Payer: Self-pay

## 2018-06-25 ENCOUNTER — Ambulatory Visit: Payer: BLUE CROSS/BLUE SHIELD | Admitting: Family Medicine

## 2018-06-25 ENCOUNTER — Other Ambulatory Visit: Payer: Self-pay | Admitting: *Deleted

## 2018-06-25 ENCOUNTER — Encounter: Payer: Self-pay | Admitting: Family Medicine

## 2018-06-25 VITALS — BP 125/84 | HR 77 | Temp 97.7°F | Resp 12 | Ht 72.0 in | Wt 188.5 lb

## 2018-06-25 DIAGNOSIS — I10 Essential (primary) hypertension: Secondary | ICD-10-CM

## 2018-06-25 DIAGNOSIS — Z9189 Other specified personal risk factors, not elsewhere classified: Secondary | ICD-10-CM

## 2018-06-25 DIAGNOSIS — E785 Hyperlipidemia, unspecified: Secondary | ICD-10-CM | POA: Diagnosis not present

## 2018-06-25 DIAGNOSIS — R740 Nonspecific elevation of levels of transaminase and lactic acid dehydrogenase [LDH]: Secondary | ICD-10-CM | POA: Diagnosis not present

## 2018-06-25 DIAGNOSIS — Z1159 Encounter for screening for other viral diseases: Secondary | ICD-10-CM | POA: Diagnosis not present

## 2018-06-25 DIAGNOSIS — E1165 Type 2 diabetes mellitus with hyperglycemia: Secondary | ICD-10-CM

## 2018-06-25 DIAGNOSIS — E1142 Type 2 diabetes mellitus with diabetic polyneuropathy: Secondary | ICD-10-CM | POA: Diagnosis not present

## 2018-06-25 DIAGNOSIS — R7401 Elevation of levels of liver transaminase levels: Secondary | ICD-10-CM

## 2018-06-25 LAB — LIPID PANEL
CHOLESTEROL: 284 mg/dL — AB (ref 0–200)
HDL: 39.8 mg/dL (ref >39.00)
NonHDL: 244.56
Total CHOL/HDL Ratio: 7
Triglycerides: 247 mg/dL — ABNORMAL HIGH (ref 0.0–149.0)
VLDL: 49.4 mg/dL — AB (ref 0.0–40.0)

## 2018-06-25 LAB — COMPREHENSIVE METABOLIC PANEL
ALBUMIN: 4 g/dL (ref 3.5–5.2)
ALK PHOS: 51 U/L (ref 39–117)
ALT: 15 U/L (ref 0–35)
AST: 16 U/L (ref 0–37)
BILIRUBIN TOTAL: 0.4 mg/dL (ref 0.2–1.2)
BUN: 17 mg/dL (ref 6–23)
CALCIUM: 9.2 mg/dL (ref 8.4–10.5)
CO2: 29 mEq/L (ref 19–32)
Chloride: 99 mEq/L (ref 96–112)
Creatinine, Ser: 1.01 mg/dL (ref 0.40–1.20)
GFR: 70.8 mL/min (ref >60.00)
Glucose, Bld: 173 mg/dL — ABNORMAL HIGH (ref 70–99)
POTASSIUM: 3.6 meq/L (ref 3.5–5.1)
Sodium: 135 mEq/L (ref 135–145)
TOTAL PROTEIN: 6.5 g/dL (ref 6.0–8.3)

## 2018-06-25 LAB — HEMOGLOBIN A1C: Hgb A1c MFr Bld: 10.5 % — ABNORMAL HIGH (ref 4.6–6.5)

## 2018-06-25 LAB — LDL CHOLESTEROL, DIRECT: Direct LDL: 187 mg/dL

## 2018-06-25 LAB — MICROALBUMIN / CREATININE URINE RATIO
Creatinine,U: 43.9 mg/dL
Microalb Creat Ratio: 13.7 mg/g (ref 0.0–30.0)
Microalb, Ur: 6 mg/dL — ABNORMAL HIGH (ref 0.0–1.9)

## 2018-06-25 MED ORDER — HYDROCHLOROTHIAZIDE 25 MG PO TABS: 25.0000 mg | ORAL_TABLET | Freq: Every day | ORAL | 0 refills | Status: DC

## 2018-06-25 MED ORDER — AMLODIPINE BESYLATE 5 MG PO TABS: 5.0000 mg | ORAL_TABLET | Freq: Every day | ORAL | 1 refills | Status: DC

## 2018-06-25 MED ORDER — SITAGLIPTIN PHOS-METFORMIN HCL 50-1000 MG PO TABS: 1.0000 | ORAL_TABLET | Freq: Every day | ORAL | 0 refills | Status: DC

## 2018-06-25 NOTE — Assessment & Plan Note (Signed)
She is not on a statin medication. Further recommendation will be given according to lipid panel results.

## 2018-06-25 NOTE — Assessment & Plan Note (Signed)
Probably has been poorly controlled for several months. She refused statin medication recommended by her endocrinologist. She refused referral to a different endocrinologist, she would like to continue following with PCP. We discussed possible complications of poorly controlled DM. Regular exercise and healthy diet with avoidance of added sugar food intake is an important part of treatment and recommended. Annual eye exam, periodic dental and foot care recommended. F/U in 4 months

## 2018-06-25 NOTE — Assessment & Plan Note (Signed)
BP adequately controlled. No changes in current management. Continue low salt diet. 

## 2018-06-25 NOTE — Patient Instructions (Addendum)
A few things to remember from today's visit:   Poorly controlled type 2 diabetes mellitus with peripheral neuropathy ([place]) - Plan: Hemoglobin A1c, Fructosamine, Comprehensive metabolic panel  Essential hypertension - Plan: Comprehensive metabolic panel  Encounter for HCV screening test for high risk patient - Plan: Hepatitis C antibody

## 2018-06-25 NOTE — Assessment & Plan Note (Signed)
Further recommendations will be given according to lab results. 

## 2018-06-25 NOTE — Telephone Encounter (Signed)
Per Dr Martinique she has approved to take pt back on her panel. IF pt ever wants to change providers in the future she will not be taken back by Dr Martinique.   PCP has been changed.

## 2018-06-25 NOTE — Progress Notes (Signed)
HPI:   Kimberly [name] is a 62 y.o. female, who is here today for 4 months follow up.  She was last seen on 02/02/2018.   Hypertension: Currently she is on HCTZ 25 mg daily and amlodipine 5 mg daily. She is not checking BP at home. Denies severe/frequent headache, visual changes, chest pain, dyspnea, palpitation, claudication, focal weakness, or edema.   Diabetes Mellitus II:  Dx in 2006. She has not been compliant with dietary recommendations. Problem has been poorly controlled. New medications was recommended by endocrinologist but she did not follow recommendations.  She was following with Dr. Cruzita Lederer but she has not keep follow-up appointments.  Currently on Janumet 50-1000 mg daily and Lantus 60 units daily.  Checking BS's : Not checking. Hypoglycemia: Denies  She is tolerating medications well. She denies abdominal pain, nausea, vomiting, polydipsia, polyuria, or polyphagia. Reporting no numbness, tingling, or burning.   Lab Results  Component Value Date   CREATININE 0.80 10/16/2017   BUN 15 10/16/2017   NA 139 10/16/2017   K 3.9 10/16/2017   CL 101 10/16/2017   CO2 29 10/16/2017    Lab Results  Component Value Date   HGBA1C 11.8 (A) 02/02/2018   Lab Results  Component Value Date   MICROALBUR 5.5 (H) 08/21/2014    History of abnormal LFTs. She has not noted abdominal pain, nausea, vomiting, or color changes in urine/stool/skin. Lab Results  Component Value Date   ALT 35 10/05/2016   AST 26 10/05/2016   ALKPHOS 60 10/05/2016   BILITOT 0.4 10/05/2016   Hyperlipidemia: She is not on statin medication. She has not been consistent with low-fat diet.  Lab Results  Component Value Date   CHOL 207 (H) 10/16/2017   HDL 49.40 10/16/2017   LDLCALC 103 05/18/2016   LDLDIRECT 126.0 10/16/2017   TRIG 217.0 (H) 10/16/2017   CHOLHDL 4 10/16/2017    Review of Systems  Constitutional: Negative for activity change, appetite change, fatigue and  fever.  HENT: Negative for mouth sores, nosebleeds and trouble swallowing.   Eyes: Negative for redness and visual disturbance.  Respiratory: Negative for shortness of breath and wheezing.   Cardiovascular: Negative for chest pain, palpitations and leg swelling.  Gastrointestinal: Negative for abdominal pain, nausea and vomiting.       Negative for changes in bowel habits.  Endocrine: Negative for polydipsia, polyphagia and polyuria.  Genitourinary: Negative for decreased urine volume, dysuria and hematuria.  Skin: Negative for rash and wound.  Neurological: Negative for syncope, weakness and headaches.  Psychiatric/Behavioral: Negative for confusion. The patient is nervous/anxious.     Current Outpatient Medications on File Prior to Visit  Medication Sig Dispense Refill  . benzonatate (TESSALON) 200 MG capsule Take 1 capsule (200 mg total) by mouth 3 (three) times daily as needed for cough. 30 capsule 11  . budesonide-formoterol (SYMBICORT) 160-4.5 MCG/ACT inhaler Inhale 2 puffs into the lungs 2 (two) times daily. 1 Inhaler 11  . ECHINACEA PO Take 760 mg by mouth 2 (two) times daily.    Marland Kitchen glucose blood (CONTOUR NEXT TEST) test strip Use as instructed 100 each 12  . Insulin Glargine (LANTUS) 100 UNIT/ML Solostar Pen Inject 60 Units into the skin at bedtime. 15 mL 11  . Insulin Pen Needle (PEN NEEDLES) 30G X 8 MM MISC 1 each by Does not apply route daily. 100 each 5  . Korean Panax Ginseng 100 MG CAPS Take 100 mg by mouth.    Marland Kitchen  Lancets Thin MISC USE TO TEST BLOOD SUGAR DAILY 100 each 5   No current facility-administered medications on file prior to visit.      Past Medical History:  Diagnosis Date  . Diabetes mellitus 2001  . Hyperlipidemia Dx 2012  . Hypertension 2001   Allergies  Allergen Reactions  . Ace Inhibitors Cough    Social History   Socioeconomic History  . Marital status: Divorced    Spouse name: Not on file  . Number of children: Not on file  . Years of  education: Not on file  . Highest education level: Not on file  Occupational History  . Not on file  Social Needs  . Financial resource strain: Not on file  . Food insecurity:    Worry: Not on file    Inability: Not on file  . Transportation needs:    Medical: Not on file    Non-medical: Not on file  Tobacco Use  . Smoking status: Never Smoker  . Smokeless tobacco: Never Used  Substance and Sexual Activity  . Alcohol use: Yes    Comment: occ  . Drug use: No  . Sexual activity: Not on file  Lifestyle  . Physical activity:    Days per week: Not on file    Minutes per session: Not on file  . Stress: Not on file  Relationships  . Social connections:    Talks on phone: Not on file    Gets together: Not on file    Attends religious service: Not on file    Active member of club or organization: Not on file    Attends meetings of clubs or organizations: Not on file    Relationship status: Not on file  Other Topics Concern  . Not on file  Social History Narrative  . Not on file    Vitals:   06/25/18 0821  BP: 125/84  Pulse: 77  Resp: 12  Temp: 97.7 F (36.5 C)  SpO2: 94%   Body mass index is 25.57 kg/m.    Physical Exam  Nursing note and vitals reviewed. Constitutional: She is oriented to person, place, and time. She appears well-developed and well-nourished. No distress.  HENT:  Head: Normocephalic and atraumatic.  Mouth/Throat: Oropharynx is clear and moist and mucous membranes are normal.  Eyes: Pupils are equal, round, and reactive to light. Conjunctivae are normal.  Cardiovascular: Normal rate and regular rhythm.  No murmur heard. Pulses:      Dorsalis pedis pulses are 2+ on the right side and 2+ on the left side.  Respiratory: Effort normal and breath sounds normal. No respiratory distress.  GI: Soft. She exhibits no mass. There is no hepatomegaly. There is no abdominal tenderness.  Musculoskeletal:        General: No edema.  Lymphadenopathy:    She  has no cervical adenopathy.  Neurological: She is alert and oriented to person, place, and time. She has normal strength. No cranial nerve deficit. Gait normal.  Skin: Skin is warm. No rash noted. No erythema.  Psychiatric: Her mood appears anxious.  Well groomed, good eye contact.   Diabetic Foot Exam - Simple   Simple Foot Form Diabetic Foot exam was performed with the following findings:  Yes 06/25/2018  8:58 AM  Visual Inspection See comments:  Yes Sensation Testing Intact to touch and monofilament testing bilaterally:  Yes Pulse Check Posterior Tibialis and Dorsalis pulse intact bilaterally:  Yes Comments Calluses bilateral.      ASSESSMENT AND  PLAN:   Ms. Jacqualynn Parco was seen today for 4 months follow-up.  Orders Placed This Encounter  Procedures  . Hepatitis C antibody  . Hemoglobin A1c  . Fructosamine  . Comprehensive metabolic panel  . Lipid panel  . Microalbumin / creatinine urine ratio  . LDL cholesterol, direct   Lab Results  Component Value Date   HGBA1C 10.5 (H) 06/25/2018   Lab Results  Component Value Date   CREATININE 1.01 06/25/2018   BUN 17 06/25/2018   NA 135 06/25/2018   K 3.6 06/25/2018   CL 99 06/25/2018   CO2 29 06/25/2018   Lab Results  Component Value Date   CHOL 284 (H) 06/25/2018   HDL 39.80 06/25/2018   LDLCALC 103 05/18/2016   LDLDIRECT 187.0 06/25/2018   TRIG 247.0 (H) 06/25/2018   CHOLHDL 7 06/25/2018   Lab Results  Component Value Date   ALT 15 06/25/2018   AST 16 06/25/2018   ALKPHOS 51 06/25/2018   BILITOT 0.4 06/25/2018   Lab Results  Component Value Date   MICROALBUR 6.0 (H) 06/25/2018     Poorly controlled type 2 diabetes mellitus with peripheral neuropathy ([facility]) Probably has been poorly controlled for several months. She refused statin medication recommended by her endocrinologist. She refused referral to a different endocrinologist, she would like to continue following with PCP. We discussed  possible complications of poorly controlled DM. Regular exercise and healthy diet with avoidance of added sugar food intake is an important part of treatment and recommended. Annual eye exam, periodic dental and foot care recommended. F/U in 4 months   Hypertension BP adequately controlled. No changes in current management. Continue low-salt diet.  Elevated transaminase level Further recommendations will be given according to lab results.  Hyperlipidemia She is not on a statin medication. Further recommendation will be given according to lipid panel results.  Encounter for HCV screening test for high risk patient - Hepatitis C antibody      Temesgen Weightman G. Martinique, MD  [facility]. [place] office.

## 2018-06-26 ENCOUNTER — Encounter: Payer: Self-pay | Admitting: Family Medicine

## 2018-06-27 LAB — FRUCTOSAMINE: Fructosamine: 391 umol/L — ABNORMAL HIGH (ref 205–285)

## 2018-06-27 LAB — HEPATITIS C ANTIBODY
Hepatitis C Ab: NONREACTIVE
SIGNAL TO CUT-OFF: 0.01 (ref <1.00)

## 2018-06-28 ENCOUNTER — Other Ambulatory Visit: Payer: Self-pay | Admitting: *Deleted

## 2018-06-28 MED ORDER — SITAGLIP PHOS-METFORMIN HCL ER 50-1000 MG PO TB24: 1.0000 | ORAL_TABLET | Freq: Every day | ORAL | 3 refills | Status: DC

## 2018-06-29 ENCOUNTER — Telehealth: Payer: Self-pay | Admitting: *Deleted

## 2018-06-29 NOTE — Telephone Encounter (Signed)
Patient informed that PCP is out of the office and will return Monday and she hasn't reviewed them, will release in my chart and call with recommendations once she does. Patient verbalized understanding.

## 2018-06-29 NOTE — Telephone Encounter (Signed)
Copied from [place] [id number]. Topic: General - Inquiry >> Jun 29, 2018  9:20 AM Alanda Slim E wrote: Reason for CRM: Pt called to inquire about the status of her lab results and would like a call to go over them/ please advise

## 2018-07-02 ENCOUNTER — Other Ambulatory Visit: Payer: Self-pay | Admitting: *Deleted

## 2018-07-02 DIAGNOSIS — E1140 Type 2 diabetes mellitus with diabetic neuropathy, unspecified: Secondary | ICD-10-CM

## 2018-07-02 DIAGNOSIS — Z794 Long term (current) use of insulin: Principal | ICD-10-CM

## 2018-07-02 NOTE — Telephone Encounter (Signed)
Message sent to Dr. Jordan for review. 

## 2018-07-02 NOTE — Telephone Encounter (Signed)
Patient reviewed lab results on 07/01/2018 through my chart.

## 2018-07-09 ENCOUNTER — Encounter: Payer: BLUE CROSS/BLUE SHIELD | Attending: Family Medicine | Admitting: Registered"

## 2018-07-09 ENCOUNTER — Encounter: Payer: Self-pay | Admitting: Registered"

## 2018-07-09 DIAGNOSIS — E1165 Type 2 diabetes mellitus with hyperglycemia: Secondary | ICD-10-CM | POA: Diagnosis present

## 2018-07-09 DIAGNOSIS — E1142 Type 2 diabetes mellitus with diabetic polyneuropathy: Secondary | ICD-10-CM | POA: Diagnosis not present

## 2018-07-09 NOTE — Patient Instructions (Addendum)
Consider taking a B complex that includes vitamin B12 You can try a lactose free milk to help with symptoms of stomach upset with dairy. Aim to eat balanced meals and snacks Aim to get protein with each meal. When eating fruit for a snack, be sure to also eat a protein Continue with your exercise to help maintain/build muscle mass and help with bone health. Consider eating fatty fish 2-3 times per week Continue taking good care of your emotional health Consider checking your blood sugar over the next 2-3 weeks equal number of times fasting (when it is low) and 2 hours after some meals and bring with you to your next appointment

## 2018-07-09 NOTE — Progress Notes (Signed)
Diabetes Self-Management Education  Visit Type: First/Initial  Appt. Start Time: 1405 Appt. End Time: 2025  07/09/2018  [name], identified by name and date of birth, is a 63 y.o. female with a diagnosis of Diabetes: Type 2.   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 Pt states she feels she has always eaten pretty healthy and doesn't understand why she has diabetes.   Pt states she used to have hypoglycemic symptoms but not recently. Pt states in the past she felt very bad when her BG was in the 70s.   Pt states sometimes she skips breakfast and only eats 2 meals per day. Pt states she loves beans, vegetables, salmon and chicken, eats apples daily and doesn't drink juice.  Pt reports losing 10 lbs over a 6 month period last year. Pt states she does not want to lose anymore weight because her legs are too skinny. Pt states she has been active for a while including weight training and days that she walks aims for 10K steps.   Pt states she has learned to leave the insulin needle in long enough for the insulin to absorb into the skin. RD reviewed proper injection site/rotation technique.  Sleep: pt reports 8 hrs restful.  Diabetes Self-Management Education - 07/09/18 1408      Visit Information   Visit Type  First/Initial      Initial Visit   Diabetes Type  Type 2    Are you currently following a meal plan?  Yes    What type of meal plan do you follow?  avoids fried food, white rice, eats a lot of beans, a lot of fish    Are you taking your medications as prescribed?  Yes    Date Diagnosed  20 yrs       Health Coping   How would you rate your overall health?  Good      Psychosocial Assessment   Patient Belief/Attitude about Diabetes  Denial    How often do you need to have someone help you when you read  instructions, pamphlets, or other written materials from your doctor or pharmacy?  1 - Never    What is the last grade level you completed in school?  BA degree      Complications   Last HgB A1C per patient/outside source  10.5 %    How often do you check your blood sugar?  1-2 times/day    Fasting Blood glucose range (mg/dL)  130-179    Number of hypoglycemic episodes per month  0    Number of hyperglycemic episodes per week  0    Have you had a dilated eye exam in the past 12 months?  No    Have you had a dental exam in the past 12 months?  No    Are you checking your feet?  Yes    How many days per week are you checking your feet?  7      Dietary Intake   Breakfast  oatmeal, canteloupe, water    Snack (morning)  blueberries OR nuts OR carrots, celery    Lunch  left overs    Snack (afternoon)  PB crackers OR cheese and crackers    Dinner  black eyed peas, salmon, salad    Snack (evening)  cheese puff  Beverage(s)  green tea with lemon juice, wine occassionally, chamomile tea      Exercise   Exercise Type  Moderate (swimming / aerobic walking)    How many days per week to you exercise?  3    How many minutes per day do you exercise?  90    Total minutes per week of exercise  270      Patient Education   Previous Diabetes Education  Yes (please comment)   over 10 yrs ago   Disease state   Factors that contribute to the development of diabetes    Nutrition management   Role of diet in the treatment of diabetes and the relationship between the three main macronutrients and blood glucose level    Physical activity and exercise   Role of exercise on diabetes management, blood pressure control and cardiac health.    Medications  Reviewed patients medication for diabetes, action, purpose, timing of dose and side effects.    Monitoring  Identified appropriate SMBG and/or A1C goals.;Purpose and frequency of SMBG.    Psychosocial adjustment  Role of stress on diabetes       Individualized Goals (developed by patient)   Nutrition  General guidelines for healthy choices and portions discussed    Physical Activity  Exercise 3-5 times per week    Monitoring   test my blood glucose as discussed    Reducing Risk  increase portions of healthy fats      Outcomes   Expected Outcomes  Demonstrated interest in learning. Expect positive outcomes    Future DMSE  2 months    Program Status  Not Completed      Individualized Plan for Diabetes Self-Management Training:   Learning Objective:  Patient will have a greater understanding of diabetes self-management. Patient education plan is to attend individual and/or group sessions per assessed needs and concerns.   Patient Instructions  Consider taking a B complex that includes vitamin B12 You can try a lactose free milk to help with symptoms of stomach upset with dairy. Aim to eat balanced meals and snacks Aim to get protein with each meal. When eating fruit for a snack, be sure to also eat a protein Continue with your exercise to help maintain/build muscle mass and help with bone health. Consider eating fatty fish 2-3 times per week Continue taking good care of your emotional health Consider checking your blood sugar over the next 2-3 weeks equal number of times fasting (when it is low) and 2 hours after some meals and bring with you to your next appointment   Expected Outcomes:  Demonstrated interest in learning. Expect positive outcomes  Education material provided: A1C conversion sheet and My Plate, Glusose log book- Staying on track by Novonordisk, medications list (oral and insulin)  If problems or questions, patient to contact team via:  Phone  Future DSME appointment: 2 months

## 2018-08-30 ENCOUNTER — Other Ambulatory Visit: Payer: Self-pay | Admitting: Internal Medicine

## 2018-09-04 ENCOUNTER — Ambulatory Visit: Payer: BLUE CROSS/BLUE SHIELD | Admitting: Registered"

## 2018-09-06 ENCOUNTER — Encounter: Payer: Self-pay | Admitting: Internal Medicine

## 2018-09-06 ENCOUNTER — Other Ambulatory Visit: Payer: Self-pay

## 2018-09-06 ENCOUNTER — Ambulatory Visit: Payer: BLUE CROSS/BLUE SHIELD | Admitting: Internal Medicine

## 2018-09-06 VITALS — BP 140/90 | HR 89 | Temp 98.1°F | Wt 194.4 lb

## 2018-09-06 DIAGNOSIS — E049 Nontoxic goiter, unspecified: Secondary | ICD-10-CM

## 2018-09-06 DIAGNOSIS — B369 Superficial mycosis, unspecified: Secondary | ICD-10-CM

## 2018-09-06 MED ORDER — CLOTRIMAZOLE 1 % EX CREA: 1.0000 "application " | TOPICAL_CREAM | Freq: Two times a day (BID) | CUTANEOUS | 0 refills | Status: DC

## 2018-09-06 NOTE — Progress Notes (Signed)
Acute Office Visit     CC/Reason for Visit: Rash on neck  HPI: Kimberly [name] is a 63 y.o. female who is coming in today for the above mentioned reasons.  For the past week and a half she has noticed a red linear rash where her neck meets her torso, this has subsequently extended to the back of her neck as well.  It is in the crease, it has subsequently become more purplish and scaly.  It is extremely itchy.  She has not tried any new perfumes or laundry detergents.   Past Medical/Surgical History: Past Medical History:  Diagnosis Date  . Diabetes mellitus 2001  . Hyperlipidemia Dx 2012  . Hypertension 2001    Past Surgical History:  Procedure Laterality Date  . ABDOMINAL HYSTERECTOMY      Social History:  reports that she has never smoked. She has never used smokeless tobacco. She reports current alcohol use. She reports that she does not use drugs.  Allergies: Allergies  Allergen Reactions  . Ace Inhibitors Cough    Family History:  Family History  Problem Relation Age of Onset  . Depression Mother   . Hypertension Mother   . Heart disease Father   . Cancer Sister   . Cancer Brother      Current Outpatient Medications:  .  amLODipine (NORVASC) 5 MG tablet, Take 1 tablet (5 mg total) by mouth daily., Disp: 90 tablet, Rfl: 1 .  benzonatate (TESSALON) 200 MG capsule, Take 1 capsule (200 mg total) by mouth 3 (three) times daily as needed for cough., Disp: 30 capsule, Rfl: 11 .  ECHINACEA PO, Take 760 mg by mouth 2 (two) times daily., Disp: , Rfl:  .  glucose blood (CONTOUR NEXT TEST) test strip, Use as instructed, Disp: 100 each, Rfl: 12 .  hydrochlorothiazide (HYDRODIURIL) 25 MG tablet, Take 1 tablet (25 mg total) by mouth daily., Disp: 90 tablet, Rfl: 0 .  Insulin Glargine (LANTUS) 100 UNIT/ML Solostar Pen, Inject 60 Units into the skin at bedtime., Disp: 15 mL, Rfl: 11 .  Insulin Pen Needle (PEN NEEDLES) 30G X 8 MM MISC, 1 each by Does not apply route  daily., Disp: 100 each, Rfl: 5 .  [place] Panax Ginseng 100 MG CAPS, Take 100 mg by mouth., Disp: , Rfl:  .  Lancets Thin MISC, USE TO TEST BLOOD SUGAR DAILY, Disp: 100 each, Rfl: 5 .  SitaGLIPtin-MetFORMIN HCl (JANUMET XR) 50-1000 MG TB24, Take 1 tablet by mouth daily., Disp: 90 tablet, Rfl: 3 .  SYMBICORT 160-4.5 MCG/ACT inhaler, INHALE TWO PUFFS BY MOUTH TWICE A DAY, Disp: 10.2 g, Rfl: 0 .  clotrimazole (LOTRIMIN) 1 % cream, Apply 1 application topically 2 (two) times daily., Disp: 30 g, Rfl: 0  Review of Systems:  Constitutional: Denies fever, chills, diaphoresis, appetite change and fatigue.  HEENT: Denies photophobia, eye pain, redness, hearing loss, ear pain, congestion, sore throat, rhinorrhea, sneezing, mouth sores, trouble swallowing, neck pain, neck stiffness and tinnitus.   Respiratory: Denies SOB, DOE, cough, chest tightness,  and wheezing.   Cardiovascular: Denies chest pain, palpitations and leg swelling.  Gastrointestinal: Denies nausea, vomiting, abdominal pain, diarrhea, constipation, blood in stool and abdominal distention.  Genitourinary: Denies dysuria, urgency, frequency, hematuria, flank pain and difficulty urinating.  Endocrine: Denies: hot or cold intolerance, sweats, changes in hair or nails, polyuria, polydipsia. Musculoskeletal: Denies myalgias, back pain, joint swelling, arthralgias and gait problem.  Skin: Denies pallor, rash and wound.  Neurological: Denies dizziness,  seizures, syncope, weakness, light-headedness, numbness and headaches.  Hematological: Denies adenopathy. Easy bruising, personal or family bleeding history  Psychiatric/Behavioral: Denies suicidal ideation, mood changes, confusion, nervousness, sleep disturbance and agitation    Physical Exam: Vitals:   09/06/18 1638  BP: 140/90  Pulse: 89  Temp: 98.1 F (36.7 C)  TempSrc: Oral  SpO2: 96%  Weight: 194 lb 6.4 oz (88.2 kg)    Body mass index is 26.37 kg/m.   Constitutional: NAD, calm,  comfortable Eyes: PERRL, lids and conjunctivae normal ENMT: Mucous membranes are moist. Neck: normal, supple, thyroid gland appears enlarged Respiratory: clear to auscultation bilaterally, no wheezing, no crackles. Normal respiratory effort. No accessory muscle use.  Cardiovascular: Regular rate and rhythm, no murmurs / rubs / gallops. No extremity edema. 2+ pedal pulses. No carotid bruits.  Skin: Purplish rash in the crease of her neck in a ring fashion all around the neck. Psychiatric: Normal judgment and insight. Alert and oriented x 3. Normal mood.    Impression and Plan:  Fungal rash of torso  -Appears to be fungal.  Will prescribe clotrimazole cream to apply twice daily.  Enlarged thyroid gland  -She has a significant, what appears to be enlargement of the thyroid gland, she had thyroid function tests that were normal in April 2019. -Will send for thyroid ultrasound as initial evaluation.    Patient Instructions  -Nice seeing you today!  -Apply clotrimazole cream twice a day to the rash on front and back of your neck.  -Will send for thyroid ultrasound to evaluate the mass in your neck. Will notify you when results are available.     Lelon Frohlich, MD [facility]

## 2018-09-06 NOTE — Patient Instructions (Addendum)
-  Nice seeing you today!  -Apply clotrimazole cream twice a day to the rash on front and back of your neck.  -Will send for thyroid ultrasound to evaluate the mass in your neck. Will notify you when results are available.

## 2018-09-20 ENCOUNTER — Telehealth: Payer: Self-pay | Admitting: *Deleted

## 2018-09-20 NOTE — Telephone Encounter (Signed)
Copied from [place] [id number]. Topic: General - Other >> Sep 20, 2018  1:01 PM Rayann Heman wrote: Reason for CRM: pt called and states that she has not received a call back about scheduling an ultrasound. Pt would like a call back regarding. Please advise

## 2018-09-21 NOTE — Telephone Encounter (Signed)
Spoke with patient and informed her that [place] should be calling her to schedule [place], patient given information for [facility]. Nothing further needed at this time.

## 2018-09-26 ENCOUNTER — Other Ambulatory Visit: Payer: Self-pay | Admitting: Internal Medicine

## 2018-09-26 ENCOUNTER — Telehealth: Payer: Self-pay | Admitting: Family Medicine

## 2018-09-26 NOTE — Telephone Encounter (Signed)
Patient has been home and abiding the quarantine since 09/10/18. Received message today that a coworker tested positive that she had been with back on the 16th. She does not have any symptoms and has not had any during this time. Recommended continued stay at home, monitor symptoms and call back if needed.

## 2018-09-27 ENCOUNTER — Other Ambulatory Visit: Payer: Self-pay | Admitting: Family Medicine

## 2018-09-27 NOTE — Telephone Encounter (Signed)
Dr Jordan pt 

## 2018-09-29 ENCOUNTER — Other Ambulatory Visit: Payer: Self-pay | Admitting: Internal Medicine

## 2018-11-23 ENCOUNTER — Other Ambulatory Visit: Payer: Self-pay | Admitting: Family Medicine

## 2019-01-23 ENCOUNTER — Other Ambulatory Visit: Payer: Self-pay

## 2019-01-23 ENCOUNTER — Ambulatory Visit (INDEPENDENT_AMBULATORY_CARE_PROVIDER_SITE_OTHER): Payer: Medicare Other | Admitting: Family Medicine

## 2019-01-23 ENCOUNTER — Encounter: Payer: Self-pay | Admitting: Family Medicine

## 2019-01-23 ENCOUNTER — Ambulatory Visit: Payer: Self-pay | Admitting: *Deleted

## 2019-01-23 ENCOUNTER — Ambulatory Visit (INDEPENDENT_AMBULATORY_CARE_PROVIDER_SITE_OTHER): Payer: Medicare Other

## 2019-01-23 VITALS — BP 150/65 | HR 100 | Temp 97.8°F | Resp 12 | Ht 72.0 in | Wt 192.4 lb

## 2019-01-23 DIAGNOSIS — R0789 Other chest pain: Secondary | ICD-10-CM | POA: Diagnosis not present

## 2019-01-23 DIAGNOSIS — R071 Chest pain on breathing: Secondary | ICD-10-CM

## 2019-01-23 DIAGNOSIS — I10 Essential (primary) hypertension: Secondary | ICD-10-CM

## 2019-01-23 DIAGNOSIS — E1142 Type 2 diabetes mellitus with diabetic polyneuropathy: Secondary | ICD-10-CM

## 2019-01-23 DIAGNOSIS — R079 Chest pain, unspecified: Secondary | ICD-10-CM

## 2019-01-23 DIAGNOSIS — E1165 Type 2 diabetes mellitus with hyperglycemia: Secondary | ICD-10-CM

## 2019-01-23 DIAGNOSIS — E049 Nontoxic goiter, unspecified: Secondary | ICD-10-CM

## 2019-01-23 DIAGNOSIS — M549 Dorsalgia, unspecified: Secondary | ICD-10-CM

## 2019-01-23 DIAGNOSIS — E785 Hyperlipidemia, unspecified: Secondary | ICD-10-CM | POA: Diagnosis not present

## 2019-01-23 LAB — CBC
HCT: 40.4 % (ref 36.0–46.0)
Hemoglobin: 13.5 g/dL (ref 12.0–15.0)
MCHC: 33.4 g/dL (ref 30.0–36.0)
MCV: 86.7 fl (ref 78.0–100.0)
Platelets: 204 10*3/uL (ref 150.0–400.0)
RBC: 4.66 Mil/uL (ref 3.87–5.11)
RDW: 14.2 % (ref 11.5–15.5)
WBC: 5.4 10*3/uL (ref 4.0–10.5)

## 2019-01-23 LAB — LDL CHOLESTEROL, DIRECT: Direct LDL: 172 mg/dL

## 2019-01-23 LAB — BASIC METABOLIC PANEL
BUN: 20 mg/dL (ref 6–23)
CO2: 29 mEq/L (ref 19–32)
Calcium: 10.3 mg/dL (ref 8.4–10.5)
Chloride: 98 mEq/L (ref 96–112)
Creatinine, Ser: 1.17 mg/dL (ref 0.40–1.20)
GFR: 56.12 mL/min — ABNORMAL LOW (ref >60.00)
Glucose, Bld: 287 mg/dL — ABNORMAL HIGH (ref 70–99)
Potassium: 4.1 mEq/L (ref 3.5–5.1)
Sodium: 136 mEq/L (ref 135–145)

## 2019-01-23 LAB — LIPID PANEL
Cholesterol: 298 mg/dL — ABNORMAL HIGH (ref 0–200)
HDL: 44.9 mg/dL (ref >39.00)
Total CHOL/HDL Ratio: 7
Triglycerides: 433 mg/dL — ABNORMAL HIGH (ref 0.0–149.0)

## 2019-01-23 MED ORDER — MELOXICAM 15 MG PO TABS: 15.0000 mg | ORAL_TABLET | Freq: Every day | ORAL | 0 refills | Status: DC

## 2019-01-23 NOTE — Patient Instructions (Addendum)
A few things to remember from today's visit:   Chest pain, unspecified type - Plan: EKG 12-Lead, CBC, Basic metabolic panel, Lipid panel, Fructosamine, DG Chest 2 View  Poorly controlled type 2 diabetes mellitus with peripheral neuropathy (HCC)  Enlarged thyroid  Hyperlipidemia, unspecified hyperlipidemia type  Upper back pain on right side  Essential hypertension   Please be sure medication list is accurate. If a new problem present, please set up appointment sooner than planned today.        Costochondritis Costochondritis is swelling and irritation (inflammation) of the tissue (cartilage) that connects your ribs to your breastbone (sternum). This causes pain in the front of your chest. Usually, the pain:  Starts gradually.  Is in more than one rib. This condition usually goes away on its own over time. Follow these instructions at home:  Do not do anything that makes your pain worse.  If directed, put ice on the painful area: ? Put ice in a plastic bag. ? Place a towel between your skin and the bag. ? Leave the ice on for 20 minutes, 2-3 times a day.  If directed, put heat on the affected area as often as told by your doctor. Use the heat source that your doctor tells you to use, such as a moist heat pack or a heating pad. ? Place a towel between your skin and the heat source. ? Leave the heat on for 20-30 minutes. ? Take off the heat if your skin turns bright red. This is very important if you cannot feel pain, heat, or cold. You may have a greater risk of getting burned.  Take over-the-counter and prescription medicines only as told by your doctor.  Return to your normal activities as told by your doctor. Ask your doctor what activities are safe for you.  Keep all follow-up visits as told by your doctor. This is important. Contact a doctor if:  You have chills or a fever.  Your pain does not go away or it gets worse.  You have a cough that does not go  away. Get help right away if:  You are short of breath. This information is not intended to replace advice given to you by your health care provider. Make sure you discuss any questions you have with your health care provider. Document Released: 11/30/2007 Document Revised: 06/28/2017 Document Reviewed: 10/07/2015 Elsevier Patient Education  [place].   Please be sure medication list is accurate. If a new problem present, please set up appointment sooner than planned today.

## 2019-01-23 NOTE — Assessment & Plan Note (Signed)
HgA1C has not been at goal. For now no changes in current management, further recommendation will be given according to A1c. We discussed possible complications of poorly controlled glucose. Regular exercise and healthy diet with avoidance of added sugar food intake is an important part of treatment and recommended. Annual eye exam (overdue), periodic dental and foot care recommended. F/U in 5-6 months

## 2019-01-23 NOTE — Assessment & Plan Note (Signed)
Thyroid US will be arranged.

## 2019-01-23 NOTE — Assessment & Plan Note (Addendum)
She has not tolerated statins in the past. Recommend continue low-fat diet. Benefits of statin meds discussed.

## 2019-01-23 NOTE — Telephone Encounter (Signed)
Pt called with complaints of chest pain "for a couple of weeks"; she says that it feels heavy in the middle of her chest;the pt the pt says that it started in the back of her  right shoulder; recommendations made per nurse triage protocol; the pt verbalizes understanding but would like to be seen by Dr [place] instead; the pt can be contacted at [phone]; will route to office for final disposition. Reason for Disposition . Pain also in shoulder(s) or arm(s) or jaw  Answer Assessment - Initial Assessment Questions 1. LOCATION: "Where does it hurt?"       Middle of chest 2. RADIATION: "Does the pain go anywhere else?" (e.g., into neck, jaw, arms, back)     Yes, "in back of right shoulder bone" 3. ONSET: "When did the chest pain begin?" (Minutes, hours or days)      Around 7/07/01/2018 4. PATTERN "Does the pain come and go, or has it been constant since it started?"  "Does it get worse with exertion?"     constant 5. DURATION: "How long does it last" (e.g., seconds, minutes, hours)     weeks 6. SEVERITY: "How bad is the pain?"  (e.g., Scale 1-10; mild, moderate, or severe)    - MILD (1-3): doesn't interfere with normal activities     - MODERATE (4-7): interferes with normal activities or awakens from sleep    - SEVERE (8-10): excruciating pain, unable to do any normal activities       Rated 8 out of 10 7. CARDIAC RISK FACTORS: "Do you have any history of heart problems or risk factors for heart disease?" (e.g., angina, prior heart attack; diabetes, high blood pressure, high cholesterol, smoker, or strong family history of heart disease)    Diabetic, hypertension 8. PULMONARY RISK FACTORS: "Do you have any history of lung disease?"  (e.g., blood clots in lung, asthma, emphysema, birth control pills)     Asthma; sees Pulmologist 9. CAUSE: "What do you think is causing the chest pain?"     Not sure 10. OTHER SYMPTOMS: "Do you have any other symptoms?" (e.g., dizziness, nausea, vomiting,  sweating, fever, difficulty breathing, cough)      no 11. PREGNANCY: "Is there any chance you are pregnant?" "When was your last menstrual period?"       No menopause  Protocols used: CHEST PAIN-A-AH

## 2019-01-23 NOTE — Assessment & Plan Note (Signed)
BP elevated today. Recommend monitoring BP at home. We discussed possible complications of elevated BP. For now no changes in current management. Low-salt diet recommended Instructed about warning signs.

## 2019-01-23 NOTE — Progress Notes (Signed)
ACUTE VISIT   HPI:  Chief Complaint  Patient presents with  . Back Pain  . Chest Pain    Kimberly [name] is a 63 y.o. female with Hx of poorly controlled DM II,HTN,and HLD among some who is here today complaining of 2 months of right upper back pain and 3 weeks of medial chest constant pain. She is reporting both problems as new. Triage nurse recommended going to the ER but she refused. "Heavy" and soreness, 7/10.  She denies any injury. Denies associated dyspnea, palpitations, or diaphoresis. Chest pain is not radiated and it is not related with exertion. Tylenol seems to help with pain. She has not identified exacerbating factors.  Right upper back pain,interscapulsr, no radiated. Intermittent, 3/10 sharp/achy pain. Exacerbated by certain movements and right shoulder ROM.  Negative for fever, chills, rhinorrhea, nasal congestion, sore throat, unusual cough(Hx of chronic cough), wheezing, or skin rash. Denies anosmia or ageusia. Denies sick contact.  Last f/u appt 06/26/19.  BP is elevated today. She denies checking BP at home. Currently she is on amlodipine 5 mg daily and HCTZ 25 mg daily. She denies headache, visual changes, decreased urine output, gross hematuria, foamy urine, or edema.   Lab Results  Component Value Date   CREATININE 1.01 06/25/2018   BUN 17 06/25/2018   NA 135 06/25/2018   K 3.6 06/25/2018   CL 99 06/25/2018   CO2 29 06/25/2018   DM 2, she is not checking BS. Currently she is on Janumet XR 50-1000 mg daily and Lantus 60 units daily. Denies abdominal pain, nausea,vomiting, polydipsia,polyuria, or polyphagia.   Lab Results  Component Value Date   HGBA1C 10.5 (H) 06/25/2018   HLD,she is not on statin medication because intolerance. She has not been consistent with following a healthful/low fat diet.  Component     Latest Ref Rng & Units 06/25/2018  Cholesterol     0 - 200 mg/dL 284 (H)  Triglycerides     0.0 - 149.0  mg/dL 247.0 (H)  HDL Cholesterol     >39.00 mg/dL 39.80  VLDL     0.0 - 40.0 mg/dL 49.4 (H)  Total CHOL/HDL Ratio      7  NonHDL      244.56    She is also inquiring about thyroid US that was supposed to be arranged. 07/2015 thyroid US showed small bilateral nodules. The largest is in the left upper pole measuring 5 mm.  Follow-up by clinical exam is recommended.   Lab Results  Component Value Date   TSH 1.25 10/16/2017    Review of Systems  Constitutional: Positive for fatigue. Negative for activity change and diaphoresis.  HENT: Negative for mouth sores and trouble swallowing.   Gastrointestinal: Negative for nausea and vomiting.  Endocrine: Negative for cold intolerance and heat intolerance.  Musculoskeletal: Positive for arthralgias. Negative for gait problem.  Skin: Negative for pallor and rash.  Allergic/Immunologic: Positive for environmental allergies.  Neurological: Negative for syncope and facial asymmetry.  Psychiatric/Behavioral: Negative for confusion. The patient is nervous/anxious.   Rest see pertinent positives and negatives per HPI.   Current Outpatient Medications on File Prior to Visit  Medication Sig Dispense Refill  . amLODipine (NORVASC) 5 MG tablet Take 1 tablet (5 mg total) by mouth daily. 90 tablet 1  . benzonatate (TESSALON) 200 MG capsule Take 1 capsule (200 mg total) by mouth 3 (three) times daily as needed for cough. 30 capsule 11  . clotrimazole (LOTRIMIN)  1 % cream Apply 1 application topically 2 (two) times daily. 30 g 0  . ECHINACEA PO Take 760 mg by mouth 2 (two) times daily.    Marland Kitchen glucose blood (CONTOUR NEXT TEST) test strip Use as instructed 100 each 12  . hydrochlorothiazide (HYDRODIURIL) 25 MG tablet Take 1 tablet (25 mg total) by mouth daily. 90 tablet 0  . Insulin Pen Needle (PEN NEEDLES) 30G X 8 MM MISC 1 each by Does not apply route daily. 100 each 5  . JANUMET XR 50-1000 MG TB24 TAKE ONE TABLET BY MOUTH DAILY 90 tablet 2  . Korean  Panax Ginseng 100 MG CAPS Take 100 mg by mouth.    . Lancets Thin MISC USE TO TEST BLOOD SUGAR DAILY 100 each 5  . LANTUS SOLOSTAR 100 UNIT/ML Solostar Pen INJECT 60 UNITS INTO THE SKIN EVERY NIGHT AT BEDTIME 15 mL 10  . SYMBICORT 160-4.5 MCG/ACT inhaler INHALE TWO PUFFS BY MOUTH TWO TIMES A DAY 10.2 g 3   No current facility-administered medications on file prior to visit.      Past Medical History:  Diagnosis Date  . Diabetes mellitus 2001  . Hyperlipidemia Dx 2012  . Hypertension 2001   Allergies  Allergen Reactions  . Ace Inhibitors Cough    Social History   Socioeconomic History  . Marital status: Divorced    Spouse name: Not on file  . Number of children: Not on file  . Years of education: Not on file  . Highest education level: Not on file  Occupational History  . Not on file  Social Needs  . Financial resource strain: Not on file  . Food insecurity    Worry: Not on file    Inability: Not on file  . Transportation needs    Medical: Not on file    Non-medical: Not on file  Tobacco Use  . Smoking status: Never Smoker  . Smokeless tobacco: Never Used  Substance and Sexual Activity  . Alcohol use: Yes    Comment: occ  . Drug use: No  . Sexual activity: Not on file  Lifestyle  . Physical activity    Days per week: Not on file    Minutes per session: Not on file  . Stress: Not on file  Relationships  . Social Herbalist on phone: Not on file    Gets together: Not on file    Attends religious service: Not on file    Active member of club or organization: Not on file    Attends meetings of clubs or organizations: Not on file    Relationship status: Not on file  Other Topics Concern  . Not on file  Social History Narrative  . Not on file    Vitals:   01/23/19 1154 01/23/19 1240  BP: (!) 152/64 (!) 150/65  Pulse: 100   Resp: 12   Temp: 97.8 F (36.6 C)   SpO2: 91% 97%   Body mass index is 26.09 kg/m.   Physical Exam  Nursing note  and vitals reviewed. Constitutional: She is oriented to person, place, and time. She appears well-developed. No distress.  HENT:  Head: Normocephalic and atraumatic.  Mouth/Throat: Oropharynx is clear and moist and mucous membranes are normal.  Eyes: Pupils are equal, round, and reactive to light. Conjunctivae are normal.  Neck: Thyromegaly (pulsatile, no bruit) present.  Cardiovascular: Normal rate and regular rhythm.  No murmur heard. Pulses:      Dorsalis pedis pulses  are 2+ on the right side and 2+ on the left side.    Respiratory: Effort normal and breath sounds normal. No respiratory distress. She exhibits tenderness.  GI: Soft. She exhibits no mass. There is no hepatomegaly. There is no abdominal tenderness.  Musculoskeletal:        General: No edema.     Thoracic back: She exhibits tenderness and spasm. She exhibits no bony tenderness.       Back:     Comments: Tenderness upon palpation of right interscapular area. Right side chondrocostal joints,4th-5th,tender with palpation.  Lymphadenopathy:    She has no cervical adenopathy.  Neurological: She is alert and oriented to person, place, and time. She has normal strength. No cranial nerve deficit. Gait normal.  Skin: Skin is warm. No rash noted. No erythema.  Psychiatric: Her mood appears anxious.  Well groomed, good eye contact.    ASSESSMENT AND PLAN:  Ms.  Adonia was seen today for back pain and chest pain.  Diagnoses and all orders for this visit: Orders Placed This Encounter  Procedures  . DG Chest 2 View  . US THYROID  . CBC  . Basic metabolic panel  . Lipid panel  . Fructosamine  . LDL cholesterol, direct  . EKG 12-Lead   Lab Results  Component Value Date   CREATININE 1.17 01/23/2019   BUN 20 01/23/2019   NA 136 01/23/2019   K 4.1 01/23/2019   CL 98 01/23/2019   CO2 29 01/23/2019   Lab Results  Component Value Date   WBC 5.4 01/23/2019   HGB 13.5 01/23/2019   HCT 40.4 01/23/2019   MCV 86.7  01/23/2019   PLT 204.0 01/23/2019   Lab Results  Component Value Date   CHOL 298 (H) 01/23/2019   HDL 44.90 01/23/2019   LDLCALC 103 05/18/2016   LDLDIRECT 172.0 01/23/2019   TRIG (H) 01/23/2019    433.0 Triglyceride is over 400; calculations on Lipids are invalid.   CHOLHDL 7 01/23/2019     Chest pain, unspecified type We discussed possible etiologies. Clinic hx does not suggest a cardiac etiology but given her high CVD risk it has to be considered. Pain is localized,constant, and reproducible with palpation. ?musculoskeletal. EKG done today: [facility] voltagecriteria, and unspecific T wave abnormalities.There is no other EKG for comparison. She was clearly instructed about warning signs. F/U in 3-4 weeks.  Upper back pain on right side Local massage + ice. Recommend taking Tylenol but she would like Rx. We can consider PT if pain is persistent.  Costochondral chest pain Avoid shallow breathing. Educated about Dx,prognosis,and treatment options.       She was educated about side effects of NSAIDs, including elevation of BP and increase in CVD risk.   -     meloxicam (MOBIC) 15 MG tablet; Take 1 tablet (15 mg total) by mouth daily for 15 days.  Poorly controlled type 2 diabetes mellitus with peripheral neuropathy ([place]) HgA1C has not been at goal. For now no changes in current management, further recommendation will be given according to A1c. We discussed possible complications of poorly controlled glucose. Regular exercise and healthy diet with avoidance of added sugar food intake is an important part of treatment and recommended. Annual eye exam (overdue), periodic dental and foot care recommended. F/U in 5-6 months   Hypertension BP elevated today. Recommend monitoring BP at home. We discussed possible complications of elevated BP. For now no changes in current management. Low-salt diet recommended Instructed about warning signs.  Hyperlipidemia She has not  tolerated statins in the past. Recommend continue low-fat diet.  Enlarged thyroid Thyroid US will be arranged.   Return in about 4 weeks (around 02/20/2019) for HTN and CP.    Betty G. Martinique, MD  [facility]. [place] office.

## 2019-01-24 ENCOUNTER — Other Ambulatory Visit: Payer: Self-pay | Admitting: Family Medicine

## 2019-01-24 ENCOUNTER — Encounter: Payer: Self-pay | Admitting: Family Medicine

## 2019-01-24 DIAGNOSIS — I10 Essential (primary) hypertension: Secondary | ICD-10-CM

## 2019-01-24 DIAGNOSIS — Z1231 Encounter for screening mammogram for malignant neoplasm of breast: Secondary | ICD-10-CM

## 2019-01-24 NOTE — Telephone Encounter (Signed)
This has been taking care of.

## 2019-01-26 ENCOUNTER — Other Ambulatory Visit: Payer: Self-pay | Admitting: Family Medicine

## 2019-01-26 DIAGNOSIS — I10 Essential (primary) hypertension: Secondary | ICD-10-CM

## 2019-01-26 LAB — FRUCTOSAMINE: Fructosamine: 467 umol/L — ABNORMAL HIGH (ref 205–285)

## 2019-01-29 NOTE — Telephone Encounter (Signed)
This has been taking care of see results notes.

## 2019-02-04 ENCOUNTER — Other Ambulatory Visit: Payer: Self-pay

## 2019-02-04 ENCOUNTER — Ambulatory Visit (INDEPENDENT_AMBULATORY_CARE_PROVIDER_SITE_OTHER): Payer: Medicare Other | Admitting: Family Medicine

## 2019-02-04 ENCOUNTER — Encounter: Payer: Self-pay | Admitting: Family Medicine

## 2019-02-04 VITALS — BP 122/70 | HR 84 | Temp 98.2°F | Resp 12 | Ht 72.0 in | Wt 190.0 lb

## 2019-02-04 DIAGNOSIS — Z780 Asymptomatic menopausal state: Secondary | ICD-10-CM | POA: Diagnosis not present

## 2019-02-04 DIAGNOSIS — E1140 Type 2 diabetes mellitus with diabetic neuropathy, unspecified: Secondary | ICD-10-CM

## 2019-02-04 DIAGNOSIS — Z0000 Encounter for general adult medical examination without abnormal findings: Secondary | ICD-10-CM

## 2019-02-04 DIAGNOSIS — Z23 Encounter for immunization: Secondary | ICD-10-CM

## 2019-02-04 DIAGNOSIS — Z794 Long term (current) use of insulin: Secondary | ICD-10-CM | POA: Diagnosis not present

## 2019-02-04 DIAGNOSIS — I10 Essential (primary) hypertension: Secondary | ICD-10-CM

## 2019-02-04 DIAGNOSIS — E785 Hyperlipidemia, unspecified: Secondary | ICD-10-CM | POA: Diagnosis not present

## 2019-02-04 LAB — HEPATIC FUNCTION PANEL
ALT: 17 U/L (ref 0–35)
AST: 17 U/L (ref 0–37)
Albumin: 4.4 g/dL (ref 3.5–5.2)
Alkaline Phosphatase: 51 U/L (ref 39–117)
Bilirubin, Direct: 0.1 mg/dL (ref 0.0–0.3)
Total Bilirubin: 0.4 mg/dL (ref 0.2–1.2)
Total Protein: 7 g/dL (ref 6.0–8.3)

## 2019-02-04 LAB — HEMOGLOBIN A1C: Hgb A1c MFr Bld: 10.9 % — ABNORMAL HIGH (ref 4.6–6.5)

## 2019-02-04 MED ORDER — ACCU-CHEK AVIVA CONNECT W/DEVICE KIT: 1.0000 | PACK | Freq: Every day | 0 refills | Status: DC

## 2019-02-04 NOTE — Progress Notes (Signed)
HPI:  [name], who is here today for welcome to Medicare visit and chronic disease management. She was last seen on 01/23/19,when she was c/o CP, greatly improvement, no new associated symptoms.  She lives alone. Independent ADL's and IADL's. She exercise regularly, walks and aerobic exercise 3 times per week. Enjoys reading ,walking,and spending time with her grandchildren.  No falls in the past year and denies depression symptoms.  Functional Status Survey: Is the patient deaf or have difficulty hearing?: No Does the patient have difficulty seeing, even when wearing glasses/contacts?: No Does the patient have difficulty concentrating, remembering, or making decisions?: No Does the patient have difficulty walking or climbing stairs?: No Does the patient have difficulty dressing or bathing?: No Does the patient have difficulty doing errands alone such as visiting a doctor's office or shopping?: No  Fall Risk  02/04/2019 07/09/2018 01/06/2015 10/17/2014 08/21/2014  Falls in the past year? 0 0 No No No  Number falls in past yr: 0 - - - -  Injury with Fall? 0 - - - -  Follow up Education provided - - - -    Providers she sees regularly:  Eye care provider: She has not had an eye exam in years.  Depression screen PHQ 2/9 02/04/2019  Decreased Interest 0  Down, Depressed, Hopeless 0  PHQ - 2 Score 0  Altered sleeping -  Tired, decreased energy -  Change in appetite -  Feeling bad or failure about yourself  -  Trouble concentrating -  Moving slowly or fidgety/restless -  Suicidal thoughts -  PHQ-9 Score -   Mini-Cog - 02/04/19 0940    Normal clock drawing test?  yes    How many words correct?  3       Hearing Screening   '125Hz'  '250Hz'  '500Hz'  '1000Hz'  '2000Hz'  '3000Hz'  '4000Hz'  '6000Hz'  '8000Hz'   Right ear:   Pass Pass Pass  Pass    Left ear:   Pass Pass Pass  Pass      Visual Acuity Screening   Right eye Left eye Both eyes  Without correction:  '20/50 20/30 20/25 '  With correction:       Colonoscopy done on 07/18/2016. Mammogram: 03/13/2019. DEXA:Not done before. S/P hysterectomy.  DM II,poorly controlled. She has had problem with compliance. Feet numbness/tingling intermittently in the past.No complaints today.  Recently treatment was adjusted.  Lab Results  Component Value Date   HGBA1C 10.5 (H) 06/25/2018   HLD: She is not on pharmacologic treatment, which was recommended. She is trying to improve her diet. In the past statin meds have caused myalgias LE's and arthralgias.  Lab Results  Component Value Date   CHOL 298 (H) 01/23/2019   HDL 44.90 01/23/2019   LDLCALC 103 05/18/2016   LDLDIRECT 172.0 01/23/2019   TRIG (H) 01/23/2019    433.0 Triglyceride is over 400; calculations on Lipids are invalid.   CHOLHDL 7 01/23/2019   HTN On Amlodipine 5 mg daily. Tolerating medication well. She does not check BP regularly.  Lab Results  Component Value Date   CREATININE 1.17 01/23/2019   BUN 20 01/23/2019   NA 136 01/23/2019   K 4.1 01/23/2019   CL 98 01/23/2019   CO2 29 01/23/2019   Lab Results  Component Value Date   ALT 15 06/25/2018   AST 16 06/25/2018   ALKPHOS 51 06/25/2018   BILITOT 0.4 06/25/2018    Review of Systems  Constitutional: Positive for  fatigue. Negative for activity change, appetite change and fever.  HENT: Negative for mouth sores, nosebleeds and trouble swallowing.   Eyes: Negative for redness and visual disturbance.  Respiratory: Positive for cough (chronic). Negative for shortness of breath and wheezing.   Cardiovascular: Negative for palpitations and leg swelling.  Gastrointestinal: Negative for abdominal pain, nausea and vomiting.       Negative for changes in bowel habits.  Endocrine: Negative for cold intolerance, heat intolerance, polydipsia, polyphagia and polyuria.  Genitourinary: Negative for decreased urine volume and hematuria.  Skin: Negative for rash and wound.   Neurological: Negative for syncope, weakness and headaches.  Psychiatric/Behavioral: The patient is nervous/anxious.   Rest see pertinent positives and negatives per HPI.   Current Outpatient Medications on File Prior to Visit  Medication Sig Dispense Refill  . amLODipine (NORVASC) 5 MG tablet TAKE ONE TABLET BY MOUTH DAILY 90 tablet 0  . benzonatate (TESSALON) 200 MG capsule Take 1 capsule (200 mg total) by mouth 3 (three) times daily as needed for cough. 30 capsule 11  . clotrimazole (LOTRIMIN) 1 % cream Apply 1 application topically 2 (two) times daily. 30 g 0  . ECHINACEA PO Take 760 mg by mouth 2 (two) times daily.    Marland Kitchen glucose blood (CONTOUR NEXT TEST) test strip Use as instructed 100 each 12  . hydrochlorothiazide (HYDRODIURIL) 25 MG tablet TAKE ONE TABLET BY MOUTH DAILY 90 tablet 0  . Insulin Pen Needle (PEN NEEDLES) 30G X 8 MM MISC 1 each by Does not apply route daily. 100 each 5  . JANUMET XR 50-1000 MG TB24 TAKE ONE TABLET BY MOUTH DAILY 90 tablet 2  . Korean Panax Ginseng 100 MG CAPS Take 100 mg by mouth.    . Lancets Thin MISC USE TO TEST BLOOD SUGAR DAILY 100 each 5  . LANTUS SOLOSTAR 100 UNIT/ML Solostar Pen INJECT 60 UNITS INTO THE SKIN EVERY NIGHT AT BEDTIME 15 mL 10  . SYMBICORT 160-4.5 MCG/ACT inhaler INHALE TWO PUFFS BY MOUTH TWO TIMES A DAY 10.2 g 3   No current facility-administered medications on file prior to visit.      Past Medical History:  Diagnosis Date  . Diabetes mellitus 2001  . Hyperlipidemia Dx 2012  . Hypertension 2001   Allergies  Allergen Reactions  . Ace Inhibitors Cough   Past Surgical History:  Procedure Laterality Date  . ABDOMINAL HYSTERECTOMY      Social History   Socioeconomic History  . Marital status: Divorced    Spouse name: Not on file  . Number of children: Not on file  . Years of education: Not on file  . Highest education level: Not on file  Occupational History  . Not on file  Social Needs  . Financial resource  strain: Not on file  . Food insecurity    Worry: Not on file    Inability: Not on file  . Transportation needs    Medical: Not on file    Non-medical: Not on file  Tobacco Use  . Smoking status: Never Smoker  . Smokeless tobacco: Never Used  Substance and Sexual Activity  . Alcohol use: Yes    Comment: occ  . Drug use: No  . Sexual activity: Not on file  Lifestyle  . Physical activity    Days per week: Not on file    Minutes per session: Not on file  . Stress: Not on file  Relationships  . Social Herbalist on phone:  Not on file    Gets together: Not on file    Attends religious service: Not on file    Active member of club or organization: Not on file    Attends meetings of clubs or organizations: Not on file    Relationship status: Not on file  Other Topics Concern  . Not on file  Social History Narrative  . Not on file    Vitals:   02/04/19 0905  BP: 122/70  Pulse: 84  Resp: 12  Temp: 98.2 F (36.8 C)  SpO2: 97%   Body mass index is 25.77 kg/m.   Physical Exam  Nursing note and vitals reviewed. Constitutional: She is oriented to person, place, and time. She appears well-developed and well-nourished. No distress.  HENT:  Head: Normocephalic and atraumatic.  Mouth/Throat: Oropharynx is clear and moist and mucous membranes are normal.  Eyes: Pupils are equal, round, and reactive to light. Conjunctivae are normal.  Neck: Thyromegaly present.  Cardiovascular: Normal rate and regular rhythm.  No murmur heard. Pulses:      Dorsalis pedis pulses are 2+ on the right side and 2+ on the left side.  Respiratory: Effort normal and breath sounds normal. No respiratory distress.  GI: Soft. She exhibits no mass. There is no hepatomegaly. There is no abdominal tenderness.  Musculoskeletal:        General: No edema.  Lymphadenopathy:    She has no cervical adenopathy.  Neurological: She is alert and oriented to person, place, and time. She has normal  strength. No cranial nerve deficit. Gait normal.  Skin: Skin is warm. No rash noted. No erythema.  Psychiatric: She has a normal mood and affect.  Well groomed, good eye contact.    ASSESSMENT AND PLAN:  [name] was seen today for welcome to medicare.  Diagnoses and all orders for this visit:  Orders Placed This Encounter  Procedures  . DG Bone Density  . Pneumococcal conjugate vaccine 13-valent IM  . Hepatic function panel  . Hemoglobin A1c   Lab Results  Component Value Date   HGBA1C 10.9 (H) 02/04/2019   Lab Results  Component Value Date   ALT 17 02/04/2019   AST 17 02/04/2019   ALKPHOS 51 02/04/2019   BILITOT 0.4 02/04/2019    Welcome to Medicare preventive visit We discussed the importance of staying active, physically and mentally, as well as the benefits of a healthy/balnace diet. Low impact exercise that involve stretching and strengthing are ideal. Vaccines up dated. We discussed preventive screening for the next 5-10 years, summery of recommendations given in AVS:    Health Maintenance  Topic Date Due  . Eye exam for diabetics  02/02/2016  . Mammogram  05/02/2018  . DEXA scan (bone density measurement)  09/30/2018  . Flu Shot  01/26/2019  . Hemoglobin A1C  08/07/2019  . Complete foot exam   02/04/2020  . Pneumonia vaccines (2 of 2 - PPSV23) 02/04/2020  . Tetanus Vaccine  04/30/2024  . Colon Cancer Screening  07/18/2026  .  Hepatitis C: One time screening is recommended by Center for Disease Control  (CDC) for  adults born from [birth year] through 1967.   Completed  . HIV Screening  Discontinued   Fall prevention.  Advance directives and end of life discussed, she does not have POA or living will, web site information on AVS.   Type 2 diabetes mellitus with diabetic neuropathy, with long-term current use of insulin ([place]) HgA1C is not at goal. No changes  in current management, treatment recently adjusted. Continue monitoring BS's regularly. We discussed  other mediation to consider according to HgA1C result, Victoza vs Trulicity. Regular exercise and healthy diet with avoidance of added sugar food intake is an important part of treatment and recommended. Annual eye exam (overdue), periodic dental and foot care recommended. F/U in 5-6 months  -     Hepatic function panel -     Hemoglobin A1c -     Blood Glucose Monitoring Suppl (ACCU-CHEK AVIVA CONNECT) w/Device KIT; 1 Device by Does not apply route daily.  Essential hypertension BP adequately controlled. No changes in current management. Continue low salt diet.  -     Hepatic function panel  Asymptomatic postmenopausal estrogen deficiency -     DG Bone Density; Future  Need for pneumococcal vaccination -     Pneumococcal conjugate vaccine 13-valent IM  Hyperlipidemia, unspecified hyperlipidemia type Educated about benefits of statins in regard to CVD prevention. She has not tolerated statins in the past , she is not interested. In trying in a different one. Low fat diet.   Return in about 4 months (around 06/06/2019) for dmii.   -[name] was advised to return sooner than planned today if new concerns arise.    Betty G. Martinique, MD  [facility]. [place] office.

## 2019-02-04 NOTE — Patient Instructions (Signed)
  Kimberly [name] , Thank you for taking time to come for your Medicare Wellness Visit. I appreciate your ongoing commitment to your health goals. Please review the following plan we discussed and let me know if I can assist you in the future.   These are the goals we discussed: Goals    . Blood Pressure < 140/90    . HEMOGLOBIN A1C < 8       This is a list of the screening recommended for you and due dates:  Health Maintenance  Topic Date Due  . Eye exam for diabetics  02/02/2016  . Mammogram  05/02/2018  . DEXA scan (bone density measurement)  09/30/2018  . Pneumonia vaccines (1 of 2 - PCV13) 09/30/2018  . Hemoglobin A1C  12/25/2018  . Flu Shot  01/26/2019  . Complete foot exam   06/26/2019  . Tetanus Vaccine  04/30/2024  . Colon Cancer Screening  07/18/2026  .  Hepatitis C: One time screening is recommended by Center for Disease Control  (CDC) for  adults born from [birth year] through 1967.   Completed  . HIV Screening  Discontinued   A few things to remember from today's visit:   Welcome to Medicare preventive visit  Type 2 diabetes mellitus with diabetic neuropathy, with long-term current use of insulin ([place]) - Plan: Hepatic function panel, Hemoglobin A1c  Essential hypertension - Plan: Hepatic function panel  Asymptomatic postmenopausal estrogen deficiency - Plan: DG Bone Density  We could consider adding Victoza or Trulicity depending on your A1c, if we do this we will have to stop Januvia.   A few tips:  -As we age balance is not as good as it was, so there is a higher risks for falls. Please remove small rugs and furniture that is "in your way" and could increase the risk of falls. Stretching exercises may help with fall prevention: Yoga and Tai Chi are some examples. Low impact exercise is better, so you are not very achy the next day.  -Sun screen and avoidance of direct sun light recommended. Caution with dehydration, if working outdoors be sure to drink enough  fluids.  - Some medications are not safe as we age, increases the risk of side effects and can potentially interact with other medication you are also taken;  including some of over the counter medications. Be sure to let me know when you start a new medication even if it is a dietary/vitamin supplement.   -Healthy diet low in red meet/animal fat and sugar + regular physical activity is recommended.     Advance directives:  Please see a lawyer and/or go to this website to help you with advanced directives and designating a health care power of attorney so that your wishes will be followed should you become too ill to make your own medical decisions.  [email or web address]       Please be sure medication list is accurate. If a new problem present, please set up appointment sooner than planned today.

## 2019-02-05 ENCOUNTER — Ambulatory Visit
Admission: RE | Admit: 2019-02-05 | Discharge: 2019-02-05 | Disposition: A | Discharge status: Home / Self Care | Payer: Medicare Other | Source: Ambulatory Visit | Attending: Family Medicine | Admitting: Family Medicine

## 2019-02-05 ENCOUNTER — Other Ambulatory Visit: Payer: Medicare Other

## 2019-02-05 DIAGNOSIS — E049 Nontoxic goiter, unspecified: Secondary | ICD-10-CM

## 2019-02-05 DIAGNOSIS — E0789 Other specified disorders of thyroid: Secondary | ICD-10-CM | POA: Diagnosis not present

## 2019-02-07 ENCOUNTER — Encounter: Payer: Self-pay | Admitting: Family Medicine

## 2019-02-08 ENCOUNTER — Other Ambulatory Visit: Payer: Self-pay | Admitting: *Deleted

## 2019-02-08 ENCOUNTER — Telehealth: Payer: Self-pay | Admitting: Family Medicine

## 2019-02-08 DIAGNOSIS — E1140 Type 2 diabetes mellitus with diabetic neuropathy, unspecified: Secondary | ICD-10-CM

## 2019-02-08 MED ORDER — ACCU-CHEK FASTCLIX LANCETS MISC: 3 refills | Status: DC

## 2019-02-08 MED ORDER — ACCU-CHEK AVIVA PLUS W/DEVICE KIT: PACK | 0 refills | Status: DC

## 2019-02-08 MED ORDER — ACCU-CHEK AVIVA CONNECT W/DEVICE KIT: 1.0000 | PACK | Freq: Every day | 0 refills | Status: DC

## 2019-02-08 MED ORDER — ACCU-CHEK AVIVA PLUS VI STRP: ORAL_STRIP | 3 refills | Status: DC

## 2019-02-08 NOTE — Telephone Encounter (Signed)
See note

## 2019-02-08 NOTE — Telephone Encounter (Signed)
Kimberly [name] with [facility] called and said that they dont have Blood Glucose Monitoring Suppl (ACCU-CHEK AVIVA CONNECT) w/Device KIT and would like to know if it can be changed to the Viva plus metter or the [facility] check guide. Please call patient back, thanks.

## 2019-02-08 NOTE — Telephone Encounter (Signed)
Called pt to cancel appointment and she stated that she need her glucometer Rx to be corrected at the pharmacy.  She stated that she went to go pick it up and the pharmacy told her that information was missing on the Rx and they had sent something over for Dr. Martinique to correct it.

## 2019-02-08 NOTE — Telephone Encounter (Signed)
Rx for Accu-chek Aviva Plus sent to the pharmacy as requested.

## 2019-02-19 ENCOUNTER — Ambulatory Visit: Payer: Medicare Other | Admitting: Family Medicine

## 2019-02-23 DIAGNOSIS — Z1159 Encounter for screening for other viral diseases: Secondary | ICD-10-CM | POA: Diagnosis not present

## 2019-02-27 ENCOUNTER — Encounter: Payer: Self-pay | Admitting: *Deleted

## 2019-03-13 ENCOUNTER — Other Ambulatory Visit: Payer: Self-pay

## 2019-03-13 ENCOUNTER — Ambulatory Visit
Admission: RE | Admit: 2019-03-13 | Discharge: 2019-03-13 | Disposition: A | Discharge status: Home / Self Care | Payer: Medicare Other | Source: Ambulatory Visit | Attending: Family Medicine | Admitting: Family Medicine

## 2019-03-13 DIAGNOSIS — Z1231 Encounter for screening mammogram for malignant neoplasm of breast: Secondary | ICD-10-CM | POA: Diagnosis not present

## 2019-03-16 ENCOUNTER — Ambulatory Visit (INDEPENDENT_AMBULATORY_CARE_PROVIDER_SITE_OTHER): Payer: Medicare Other

## 2019-03-16 DIAGNOSIS — Z23 Encounter for immunization: Secondary | ICD-10-CM

## 2019-04-12 ENCOUNTER — Telehealth: Payer: Self-pay | Admitting: *Deleted

## 2019-04-12 ENCOUNTER — Other Ambulatory Visit: Payer: Self-pay | Admitting: *Deleted

## 2019-04-12 DIAGNOSIS — E1142 Type 2 diabetes mellitus with diabetic polyneuropathy: Secondary | ICD-10-CM

## 2019-04-12 DIAGNOSIS — E1165 Type 2 diabetes mellitus with hyperglycemia: Secondary | ICD-10-CM

## 2019-04-12 NOTE — Telephone Encounter (Signed)
Referral placed as requested to [facility].  Copied from [place] [phone]. Topic: Referral - Request for Referral >> Apr 11, 2019  3:40 PM Scherrie Gerlach wrote: Reason for CRM: Pt would like referral to [place].  Pt had referral last year but did not go.  Tried to make appt now, they need a new referral, since she is diabetic

## 2019-04-18 ENCOUNTER — Other Ambulatory Visit: Payer: Medicare Other

## 2019-04-22 ENCOUNTER — Other Ambulatory Visit: Payer: Self-pay

## 2019-04-22 ENCOUNTER — Ambulatory Visit: Payer: Medicare Other | Admitting: Podiatry

## 2019-04-22 ENCOUNTER — Encounter: Payer: Self-pay | Admitting: Podiatry

## 2019-04-22 ENCOUNTER — Other Ambulatory Visit: Payer: Self-pay | Admitting: Family Medicine

## 2019-04-22 ENCOUNTER — Ambulatory Visit (INDEPENDENT_AMBULATORY_CARE_PROVIDER_SITE_OTHER): Payer: Medicare Other

## 2019-04-22 VITALS — BP 189/89 | HR 93 | Resp 16

## 2019-04-22 DIAGNOSIS — I10 Essential (primary) hypertension: Secondary | ICD-10-CM

## 2019-04-22 DIAGNOSIS — M79674 Pain in right toe(s): Secondary | ICD-10-CM

## 2019-04-22 DIAGNOSIS — E1142 Type 2 diabetes mellitus with diabetic polyneuropathy: Secondary | ICD-10-CM

## 2019-04-22 DIAGNOSIS — M79675 Pain in left toe(s): Secondary | ICD-10-CM | POA: Diagnosis not present

## 2019-04-22 DIAGNOSIS — B351 Tinea unguium: Secondary | ICD-10-CM | POA: Diagnosis not present

## 2019-04-22 MED ORDER — GABAPENTIN 300 MG PO CAPS: 300.0000 mg | ORAL_CAPSULE | Freq: Three times a day (TID) | ORAL | 3 refills | Status: DC

## 2019-04-22 NOTE — Progress Notes (Signed)
Subjective:   Patient ID: Kimberly [name], female   DOB: 63 y.o.   MRN: [id number]   HPI Patient presents with chronic nail disease left tingling burning of both feet with pain in the ankles and long-term history of diabetes not good control with last A1c being 11.  Patient does keep weight under control and does not smoke   Review of Systems  All other systems reviewed and are negative.       Objective:  Physical Exam Vitals signs and nursing note reviewed.  Constitutional:      Appearance: She is well-developed.  Pulmonary:     Effort: Pulmonary effort is normal.  Musculoskeletal: Normal range of motion.  Skin:    General: Skin is warm.  Neurological:     Mental Status: She is alert.     Neurovascular status was found to be diminished with diminished sharp dull vibratory bilateral with patient noted to have thickened dystrophic nailbeds hallux and second bilateral that are thickened and moderately painful along with history of burning tingling-like pain and ankle discomfort F2     Assessment:  Probability for neuropathy as the biggest part of the problem with traumatized nailbeds and diabetes not good control with pain in the ankles     Plan:  H&P x-rays reviewed and today I debrided nailbeds 1-5 both feet and spent a great deal time going over with her the importance of reducing her A1c and different options available.  Patient will be seen back for [place] to recheck  X-rays were negative for signs of bony injuries or arthritis of the subtalar midtarsal or ankle joint bilateral

## 2019-04-22 NOTE — Progress Notes (Signed)
[facility] Note  04/23/2019  CHIEF COMPLAINT Patient presents for Diabetic Eye Exam  HISTORY OF PRESENT ILLNESS: Kimberly [name] is a 63 y.o. female who presents to the clinic today for:   HPI    Diabetic Eye Exam    Vision is blurred for distance.  Diabetes characteristics include Type 2, taking oral medications and on insulin.  This started 20 years ago.  Blood sugar level fluctuates.  Last Blood Glucose 195 (04/22/19).  Last A1C 11 (2 mos ago).  I, the attending physician,  performed the HPI with the patient and updated documentation appropriately.          Comments    63 y/o female pt referred by her PCP, Dr. Betty Martinique for [place].  LEE x 2 yrs.  Pt is somewhat agitated this morning, and did not want to come to appt, b/c she feels all she needs is new glasses.  Pt has a cataract OD, and is psuedo OS.  Feels DVA is "okay" OU.  Denies pain, flashes, floaters.  No gtts.       Last edited by Bernarda Caffey, MD on 04/23/2019  9:31 AM. (History)    pt states her PCP sent her here for a DM exam, pt states she had cataract sx in her left eye in 2015 with a dr at [facility] that has since transferred to [facility], pt states she was not going to come to this appointment this morning bc she is not having any problems, pt states she wants glasses bc she is having trouble reading, last A1c was 10.9 on 02/04/19   Referring physician: Martinique, Betty G, MD [place],  [place] [address]  HISTORICAL INFORMATION:   Selected notes from the [place] - Referred by PCP, Dr. Betty Martinique   CURRENT MEDICATIONS: No current outpatient medications on file. (Ophthalmic Drugs)   No current facility-administered medications for this visit.  (Ophthalmic Drugs)   Current Outpatient Medications (Other)  Medication Sig  . Accu-Chek FastClix Lancets MISC Use to test blood sugar 4 times daily  . amLODipine (NORVASC) 5 MG tablet TAKE ONE TABLET  BY MOUTH DAILY  . BIOTIN PO Take by mouth.  . Blood Glucose Monitoring Suppl (ACCU-CHEK AVIVA CONNECT) w/Device KIT 1 Device by Does not apply route daily. Use to test blood sugar 4 times daily  . Blood Glucose Monitoring Suppl (ACCU-CHEK AVIVA PLUS) w/Device KIT Use to test blood sugar 4 times daily  . Cholecalciferol (D3 VITAMIN PO) Take by mouth.  . ECHINACEA PO Take 760 mg by mouth 2 (two) times daily.  Marland Kitchen gabapentin (NEURONTIN) 300 MG capsule Take 1 capsule (300 mg total) by mouth 3 (three) times daily.  Marland Kitchen glucose blood (ACCU-CHEK AVIVA PLUS) test strip Use to test blood sugar 4 times daily  . hydrochlorothiazide (HYDRODIURIL) 25 MG tablet TAKE ONE TABLET BY MOUTH DAILY  . Insulin Pen Needle (PEN NEEDLES) 30G X 8 MM MISC 1 each by Does not apply route daily.  Marland Kitchen JANUMET XR 50-1000 MG TB24 TAKE ONE TABLET BY MOUTH DAILY  . LANTUS SOLOSTAR 100 UNIT/ML Solostar Pen INJECT 60 UNITS INTO THE SKIN EVERY NIGHT AT BEDTIME  . SYMBICORT 160-4.5 MCG/ACT inhaler INHALE TWO PUFFS BY MOUTH TWO TIMES A DAY  . VITAMIN E PO Take by mouth.   No current facility-administered medications for this visit.  (Other)      REVIEW OF SYSTEMS: ROS    Positive for:  Gastrointestinal, Endocrine, Eyes   Negative for: Constitutional, Neurological, Skin, Genitourinary, Musculoskeletal, HENT, Cardiovascular, Respiratory, Psychiatric, Allergic/Imm, Heme/Lymph   Last edited by Matthew Folks, COA on 04/23/2019  8:41 AM. (History)       ALLERGIES Allergies  Allergen Reactions  . Ace Inhibitors Cough    PAST MEDICAL HISTORY Past Medical History:  Diagnosis Date  . Cataract    OD  . Diabetes mellitus 2001  . Hyperlipidemia Dx 2012  . Hypertension 2001   Past Surgical History:  Procedure Laterality Date  . ABDOMINAL HYSTERECTOMY    . CATARACT EXTRACTION Left   . EYE SURGERY Left    Cat Sx    FAMILY HISTORY Family History  Problem Relation Age of Onset  . Depression Mother   . Hypertension Mother    . Heart disease Father   . Cancer Sister   . Cancer Brother     SOCIAL HISTORY Social History   Tobacco Use  . Smoking status: Never Smoker  . Smokeless tobacco: Never Used  Substance Use Topics  . Alcohol use: Yes    Comment: occ  . Drug use: No         OPHTHALMIC EXAM:  Base Eye Exam    Visual Acuity (Snellen - Linear)      Right Left   Dist [place] 20/60 20/20   Dist ph  20/40 -2        Tonometry (Tonopen, 8:43 AM)      Right Left   Pressure 20 17       Pupils      Dark Light Shape React APD   Right 2 1 Round Brisk None   Left 2 1 Round Brisk None       Visual Fields (Counting fingers)      Left Right    Full Full       Extraocular Movement      Right Left    Full, Ortho Full, Ortho       Neuro/Psych    Oriented x3: Yes   Mood/Affect: Normal       Dilation    Both eyes: 1.0% Mydriacyl, 2.5% Phenylephrine @ 8:43 AM        Slit Lamp and Fundus Exam    Slit Lamp Exam      Right Left   Lids/Lashes Dermatochalasis - upper lid, Meibomian gland dysfunction, Ptosis Dermatochalasis - upper lid, Meibomian gland dysfunction, Ptosis   Conjunctiva/Sclera Inferior Conjunctivochalasis White and quiet   Cornea 1-2+ inferior Punctate epithelial erosions 2+ inferior Punctate epithelial erosions, Debris in tear film   Anterior Chamber Deep and quiet Deep and quiet   Iris Round and dilated, No NVI Round and dilated, No NVI   Lens 1-2+ Nuclear sclerosis, 1-2+ Cortical cataract, 2+PSC, +vacuoles PC IOL in good position   Vitreous Vitreous syneresis Vitreous syneresis       Fundus Exam      Right Left   Disc Compact, Pink and Sharp Compact, Pink and Sharp   C/D Ratio 0.1 0.2   Macula Flat, Blunted foveal reflex, No heme or edema Flat, Blunted foveal reflex, mild RPE mottling, No heme or edema   Vessels Mild Vascular attenuation, mild AV crossing changes Mild Vascular attenuation, mild AV crossing changes   Periphery Attached, rare MA  Attached, rare MA, focal  CWS just inferior to IT arcades        Refraction    Manifest Refraction      Sphere Cylinder Dist VA  Right -0.50 Sphere 20/60+2   Left [place] Sphere 20/20          IMAGING AND PROCEDURES  Imaging and Procedures for '@TODAY' @  OCT, Retina - OU - Both Eyes       Right Eye Quality was good. Central Foveal Thickness: 324. Progression has no prior data. Findings include normal foveal contour, no SRF (Trace, scattered IRHM).   Left Eye Quality was good. Central Foveal Thickness: 298. Progression has no prior data. Findings include normal foveal contour, no IRF, no SRF (Trace, scattered IRHM).   Notes *Images captured and stored on drive  Diagnosis / Impression:  NFP, no IRF/SRF OU No DME OU Trace, scattered IRHM OU   Clinical management:  See below  Abbreviations: NFP - Normal foveal profile. CME - cystoid macular edema. PED - pigment epithelial detachment. IRF - intraretinal fluid. SRF - subretinal fluid. EZ - ellipsoid zone. ERM - epiretinal membrane. ORA - outer retinal atrophy. ORT - outer retinal tubulation. SRHM - subretinal hyper-reflective material                 ASSESSMENT/PLAN:    ICD-10-CM   1. Moderate nonproliferative diabetic retinopathy of both eyes without macular edema associated with type 2 diabetes mellitus ([place])  D78.2423   2. Retinal edema  H35.81 OCT, Retina - OU - Both Eyes  3. Essential hypertension  I10   4. Hypertensive retinopathy of both eyes  H35.033   5. Combined forms of age-related cataract of right eye  H25.811   6. Pseudophakia  Z96.1     1,2. Mild nonproliferative diabetic retinopathy w/o DME, both eyes  - The incidence, risk factors for progression, natural history and treatment options for diabetic retinopathy were discussed with patient.    - The need for close monitoring of blood glucose, blood pressure, and serum lipids, avoiding cigarette or any type of tobacco, and the need for long term follow up was also discussed  with patient.  - exam shows mild scattered MA OU   - OCT without diabetic macular edema, both eyes   - f/u in 1 yr, sooner prn  3,4. Hypertensive retinopathy OU  - discussed importance of tight BP control  - monitor  5. Mixed form age related cataract OD  - The symptoms of cataract, surgical options, and treatments and risks were discussed with patient.  - discussed diagnosis and progression  - visually significant PSC OD -- pt reports significant glare symptoms  - recommend cataract evaluation -- pt plans to f/u at [facility], where she had CEIOL OS in 2015  6. Pseudophakia OS  - s/p CE/IOL (2015) at [facility]  - doing well  - monitor    Ophthalmic Meds Ordered this visit:  No orders of the defined types were placed in this encounter.  Return in about 1 year (around 04/22/2020) for DM exam.  There are no Patient Instructions on file for this visit.  Explained the diagnoses, plan, and follow up with the patient and they expressed understanding.  Patient expressed understanding of the importance of proper follow up care.   Electronically Signed by Estill Bakes, COT 04/22/19 10:43 a.m.  Gardiner Sleeper, M.D., Ph.D. Diseases & Surgery of the Retina and Vitreous [facility] 04/24/19  I have reviewed the above documentation for accuracy and completeness, and I agree with the above. Gardiner Sleeper, M.D., Ph.D. 04/24/19 12:33 AM    Abbreviations: M myopia (nearsighted); A astigmatism; H hyperopia (  farsighted); P presbyopia; Mrx spectacle prescription;  CTL contact lenses; OD right eye; OS left eye; OU both eyes  XT exotropia; ET esotropia; PEK punctate epithelial keratitis; PEE punctate epithelial erosions; DES dry eye syndrome; MGD meibomian gland dysfunction; ATs artificial tears; PFAT's preservative free artificial tears; [place] nuclear sclerotic cataract; PSC posterior subcapsular cataract; ERM epi-retinal membrane; PVD  posterior vitreous detachment; RD retinal detachment; DM diabetes mellitus; DR diabetic retinopathy; NPDR non-proliferative diabetic retinopathy; PDR proliferative diabetic retinopathy; CSME clinically significant macular edema; DME diabetic macular edema; dbh dot blot hemorrhages; CWS cotton wool spot; POAG primary open angle glaucoma; C/D cup-to-disc ratio; HVF humphrey visual field; GVF goldmann visual field; OCT optical coherence tomography; IOP intraocular pressure; BRVO Branch retinal vein occlusion; CRVO central retinal vein occlusion; CRAO central retinal artery occlusion; BRAO branch retinal artery occlusion; RT retinal tear; SB scleral buckle; PPV pars plana vitrectomy; VH Vitreous hemorrhage; PRP panretinal laser photocoagulation; IVK intravitreal kenalog; VMT vitreomacular traction; MH Macular hole;  NVD neovascularization of the disc; NVE neovascularization elsewhere; AREDS age related eye disease study; ARMD age related macular degeneration; POAG primary open angle glaucoma; EBMD epithelial/anterior basement membrane dystrophy; ACIOL anterior chamber intraocular lens; IOL intraocular lens; PCIOL posterior chamber intraocular lens; Phaco/IOL phacoemulsification with intraocular lens placement; [place] photorefractive keratectomy; LASIK laser assisted in situ keratomileusis; HTN hypertension; DM diabetes mellitus; COPD chronic obstructive pulmonary disease

## 2019-04-23 ENCOUNTER — Encounter (INDEPENDENT_AMBULATORY_CARE_PROVIDER_SITE_OTHER): Payer: Self-pay | Admitting: Ophthalmology

## 2019-04-23 ENCOUNTER — Ambulatory Visit (INDEPENDENT_AMBULATORY_CARE_PROVIDER_SITE_OTHER): Payer: Medicare Other | Admitting: Ophthalmology

## 2019-04-23 DIAGNOSIS — E113393 Type 2 diabetes mellitus with moderate nonproliferative diabetic retinopathy without macular edema, bilateral: Secondary | ICD-10-CM

## 2019-04-23 DIAGNOSIS — H35033 Hypertensive retinopathy, bilateral: Secondary | ICD-10-CM | POA: Diagnosis not present

## 2019-04-23 DIAGNOSIS — Z961 Presence of intraocular lens: Secondary | ICD-10-CM

## 2019-04-23 DIAGNOSIS — H25811 Combined forms of age-related cataract, right eye: Secondary | ICD-10-CM

## 2019-04-23 DIAGNOSIS — I10 Essential (primary) hypertension: Secondary | ICD-10-CM

## 2019-04-23 DIAGNOSIS — H3581 Retinal edema: Secondary | ICD-10-CM

## 2019-04-25 ENCOUNTER — Other Ambulatory Visit: Payer: Self-pay | Admitting: Family Medicine

## 2019-04-25 DIAGNOSIS — I10 Essential (primary) hypertension: Secondary | ICD-10-CM

## 2019-04-26 ENCOUNTER — Telehealth: Payer: Self-pay | Admitting: Family Medicine

## 2019-04-26 MED ORDER — BLOOD GLUCOSE MONITOR KIT: PACK | 0 refills | Status: DC

## 2019-04-26 NOTE — Telephone Encounter (Signed)
Pharmacy called in to inform Dr Martinique that Patient wasn't prescribed the Accu check Fast click  Lancet device. She was prescribed the accu check lancet just missing the device .   Please advise    [name] [address] [place]  [address] [place] [address]  Phone: [phone] Fax: [phone]   Medication should be sent to pharmacy above

## 2019-04-26 NOTE — Telephone Encounter (Signed)
monitor has been faxed to the pharmacy.

## 2019-04-29 ENCOUNTER — Encounter: Payer: Self-pay | Admitting: Podiatry

## 2019-05-02 ENCOUNTER — Telehealth: Payer: Self-pay | Admitting: *Deleted

## 2019-05-02 NOTE — Telephone Encounter (Signed)
Called patient on 05/02/19 to let them know that I ordered them the following medication:  [facility] Peripheral Neuropathy Cream with 5 refills  Patient had concerns originally with taking Gabapentin and will not take that medication. They were concerned about the side effects of this medicine and that is why they won't take it.  Dr. Paulla Dolly recommended patient to take peripheral neuropathy cream instead.  I told patient that because this medicine is topical, there should not be any systemic side effects. She should receive the cream within 7 days at her house.  Patient stated she understood.

## 2019-05-29 ENCOUNTER — Encounter (INDEPENDENT_AMBULATORY_CARE_PROVIDER_SITE_OTHER): Payer: Self-pay | Admitting: Ophthalmology

## 2019-06-06 ENCOUNTER — Other Ambulatory Visit: Payer: Self-pay

## 2019-06-07 ENCOUNTER — Encounter: Payer: Self-pay | Admitting: Family Medicine

## 2019-06-07 ENCOUNTER — Ambulatory Visit (INDEPENDENT_AMBULATORY_CARE_PROVIDER_SITE_OTHER): Payer: Medicare Other | Admitting: Family Medicine

## 2019-06-07 VITALS — BP 120/70 | HR 80 | Resp 12 | Ht 72.0 in | Wt 192.0 lb

## 2019-06-07 DIAGNOSIS — Z794 Long term (current) use of insulin: Secondary | ICD-10-CM

## 2019-06-07 DIAGNOSIS — I10 Essential (primary) hypertension: Secondary | ICD-10-CM

## 2019-06-07 DIAGNOSIS — E782 Mixed hyperlipidemia: Secondary | ICD-10-CM

## 2019-06-07 DIAGNOSIS — G629 Polyneuropathy, unspecified: Secondary | ICD-10-CM

## 2019-06-07 DIAGNOSIS — R222 Localized swelling, mass and lump, trunk: Secondary | ICD-10-CM

## 2019-06-07 DIAGNOSIS — E1140 Type 2 diabetes mellitus with diabetic neuropathy, unspecified: Secondary | ICD-10-CM

## 2019-06-07 LAB — LIPID PANEL
Cholesterol: 305 mg/dL — ABNORMAL HIGH (ref 0–200)
HDL: 45.4 mg/dL (ref >39.00)
NonHDL: 259.67
Total CHOL/HDL Ratio: 7
Triglycerides: 212 mg/dL — ABNORMAL HIGH (ref 0.0–149.0)
VLDL: 42.4 mg/dL — ABNORMAL HIGH (ref 0.0–40.0)

## 2019-06-07 LAB — MICROALBUMIN / CREATININE URINE RATIO
Creatinine,U: 81 mg/dL
Microalb Creat Ratio: 48.5 mg/g — ABNORMAL HIGH (ref 0.0–30.0)
Microalb, Ur: 39.3 mg/dL — ABNORMAL HIGH (ref 0.0–1.9)

## 2019-06-07 LAB — HEMOGLOBIN A1C: Hgb A1c MFr Bld: 9 % — ABNORMAL HIGH (ref 4.6–6.5)

## 2019-06-07 LAB — LDL CHOLESTEROL, DIRECT: Direct LDL: 193 mg/dL

## 2019-06-07 NOTE — Progress Notes (Signed)
    HPI:   [name], who is here today for chronic disease management.  She was last seen on 02/04/19. No new problems since her last visit. She was recently evaluated by ophthalmologist , moderate diabetic retinopathy bilateral. Also saw Dr Regal,podiatrist. Recommended Gabapentin for diabetic polyneuropathy but she did not start it, afraid of possible side effects. Tingling and burning feet sensation + ankle pain stable. States that topical vinegar has helped with toes abnormalities.  DM II: Dx'ed around 2001. Denies abdominal pain, nausea,vomiting, polydipsia,polyuria, or polyphagia. She is on Janumet XR 50-1000 mg daily and Lantus 60 U daily.  No hypoglycemias. Post prandial 150's, < 300. FG 70's.  Lab Results  Component Value Date   HGBA1C 10.9 (H) 02/04/2019   Lab Results  Component Value Date   MICROALBUR 6.0 (H) 06/25/2018   MICROALBUR 5.5 (H) 08/21/2014    HTN: Dx'ed around 2001. She does not check BP's at home.  Negative for severe/frequent headache, visual changes, chest pain, dyspnea, palpitation, focal weakness, or significant LE edema. She is on HCTZ 25 mg daily and Amlodipine 5 mg daily.  Lab Results  Component Value Date   CREATININE 1.17 01/23/2019   BUN 20 01/23/2019   NA 136 01/23/2019   K 4.1 01/23/2019   CL 98 01/23/2019   CO2 29 01/23/2019   HLD: Not interested in statin medication,she does not think she needs it at this time. Following low fat diet.  Lab Results  Component Value Date   CHOL 298 (H) 01/23/2019   HDL 44.90 01/23/2019   LDLCALC 103 05/18/2016   LDLDIRECT 172.0 01/23/2019   TRIG (H) 01/23/2019    433.0 Triglyceride is over 400; calculations on Lipids are invalid.   CHOLHDL 7 01/23/2019    Review of Systems  Constitutional: Negative for activity change, appetite change, fatigue, fever and unexpected weight change.  HENT: Negative for mouth sores, nosebleeds and sore throat.     Respiratory: Negative for cough and wheezing.   Gastrointestinal: Negative for abdominal pain, nausea and vomiting.       Negative for changes in bowel habits.  Genitourinary: Negative for decreased urine volume, dysuria and hematuria.  Musculoskeletal: Positive for arthralgias. Negative for gait problem.  Skin: Negative for rash and wound.  Neurological: Negative for syncope, facial asymmetry and speech difficulty.  Rest of ROS, see pertinent positives sand negatives in HPI  Current Outpatient Medications on File Prior to Visit  Medication Sig Dispense Refill  . Accu-Chek FastClix Lancets MISC Use to test blood sugar 4 times daily 102 each 3  . amLODipine (NORVASC) 5 MG tablet TAKE ONE TABLET BY MOUTH DAILY 90 tablet 0  . BIOTIN PO Take by mouth.    . blood glucose meter kit and supplies KIT Use to check blood sugar 4 times a day 1 each 0  . Blood Glucose Monitoring Suppl (ACCU-CHEK AVIVA CONNECT) w/Device KIT 1 Device by Does not apply route daily. Use to test blood sugar 4 times daily 1 kit 0  . Blood Glucose Monitoring Suppl (ACCU-CHEK AVIVA PLUS) w/Device KIT Use to test blood sugar 4 times daily 1 kit 0  . Cholecalciferol (D3 VITAMIN PO) Take by mouth.    . ECHINACEA PO Take 760 mg by mouth 2 (two) times daily.    . glucose blood (ACCU-CHEK AVIVA PLUS) test strip Use to test blood sugar 4 times daily 100 each 3  . hydrochlorothiazide (HYDRODIURIL) 25 MG tablet TAKE ONE   TABLET BY MOUTH DAILY 90 tablet 0  . Insulin Pen Needle (PEN NEEDLES) 30G X 8 MM MISC 1 each by Does not apply route daily. 100 each 5  . JANUMET XR 50-1000 MG TB24 TAKE ONE TABLET BY MOUTH DAILY 90 tablet 2  . LANTUS SOLOSTAR 100 UNIT/ML Solostar Pen INJECT 60 UNITS INTO THE SKIN EVERY NIGHT AT BEDTIME 15 mL 10  . NON FORMULARY [facility] Peripheral Neuropathy Cream: Bupivacaine 1%; Doxepin 3%; Gabapentin 6%; Pentoxifylline 3%; Topiramate 1%  Faxed over to [facility] - 05/02/19 with 5 refills    .  SYMBICORT 160-4.5 MCG/ACT inhaler INHALE TWO PUFFS BY MOUTH TWO TIMES A DAY 10.2 g 3  . VITAMIN E PO Take by mouth.     No current facility-administered medications on file prior to visit.     Past Medical History:  Diagnosis Date  . Cataract    OD  . Diabetes mellitus 2001  . Hyperlipidemia Dx 2012  . Hypertension 2001   Allergies  Allergen Reactions  . Ace Inhibitors Cough    Social History   Socioeconomic History  . Marital status: Divorced    Spouse name: Not on file  . Number of children: Not on file  . Years of education: Not on file  . Highest education level: Not on file  Occupational History  . Not on file  Tobacco Use  . Smoking status: Never Smoker  . Smokeless tobacco: Never Used  Substance and Sexual Activity  . Alcohol use: Yes    Comment: occ  . Drug use: No  . Sexual activity: Not on file  Other Topics Concern  . Not on file  Social History Narrative  . Not on file   Social Determinants of Health   Financial Resource Strain:   . Difficulty of Paying Living Expenses: Not on file  Food Insecurity:   . Worried About Running Out of Food in the Last Year: Not on file  . Ran Out of Food in the Last Year: Not on file  Transportation Needs:   . Lack of Transportation (Medical): Not on file  . Lack of Transportation (Non-Medical): Not on file  Physical Activity:   . Days of Exercise per Week: Not on file  . Minutes of Exercise per Session: Not on file  Stress:   . Feeling of Stress : Not on file  Social Connections:   . Frequency of Communication with Friends and Family: Not on file  . Frequency of Social Gatherings with Friends and Family: Not on file  . Attends Religious Services: Not on file  . Active Member of Clubs or Organizations: Not on file  . Attends Club or Organization Meetings: Not on file  . Marital Status: Not on file    Vitals:   06/07/19 0859  BP: 120/70  Pulse: 80  Resp: 12  SpO2: 94%   Body mass index is 26.04  kg/m.   Physical Exam  Nursing note and vitals reviewed. Constitutional: She is oriented to person, place, and time. She appears well-developed. No distress.  HENT:  Head: Normocephalic and atraumatic.  Mouth/Throat: Oropharynx is clear and moist and mucous membranes are normal.  Eyes: Pupils are equal, round, and reactive to light. Conjunctivae are normal.  Cardiovascular: Normal rate and regular rhythm.  No murmur heard. Pulses:      Dorsalis pedis pulses are 2+ on the right side and 2+ on the left side.  Respiratory: Effort normal and breath sounds normal. No respiratory distress.    GI: Soft. She exhibits no mass. There is no hepatomegaly. There is no abdominal tenderness.  Musculoskeletal:        General: No edema.  Lymphadenopathy:    She has no cervical adenopathy.  ? Supraclavicular 1 cm, bilateral. No tender or skin changes.  Neurological: She is alert and oriented to person, place, and time. She has normal strength. No cranial nerve deficit. Gait normal.  Skin: Skin is warm. No rash noted. No erythema.  Psychiatric: Her mood appears anxious.  Well groomed, good eye contact.   ASSESSMENT AND PLAN:   [name] was seen today for chronic disease management.  Orders Placed This Encounter  Procedures  . US SOFT TISSUE HEAD & NECK (NON-THYROID)  . Lipid panel  . Microalbumin / creatinine urine ratio  . Hemoglobin A1c  . LDL cholesterol, direct   Lab Results  Component Value Date   HGBA1C 9.0 (H) 06/07/2019   Lab Results  Component Value Date   MICROALBUR 39.3 (H) 06/07/2019   MICROALBUR 6.0 (H) 06/25/2018    Lab Results  Component Value Date   CHOL 305 (H) 06/07/2019   HDL 45.40 06/07/2019   LDLCALC 103 05/18/2016   LDLDIRECT 193.0 06/07/2019   TRIG 212.0 (H) 06/07/2019   CHOLHDL 7 06/07/2019    1. Type 2 diabetes mellitus with diabetic neuropathy, with long-term current use of insulin ([place]) HgA1C has not been at goal. No changes in current  management, will be adjusted according to lab result. Regular exercise and healthy diet with avoidance of added sugar food intake is an important part of treatment. Annual eye exam, periodic dental and foot care are recommended. F/U in 4 months  2. Peripheral polyneuropathy Appropriate foot care. She is not interested in pharmacologic treatment for now.  3. Essential hypertension Adequately controlled. No changes in current management. Monitor BP regularly. Continue low salt diet.  4. Supraclavicular fossa fullness ? Lymphadenopathy,bilateral and symmetric. [place] will be arranged.  5. Mixed hyperlipidemia Not well controlled. She is not interested in statin meds,benefits discussed. Continue low fat diet.   Return in about 4 months (around 10/06/2019) for dm II,HLD.   Rand Etchison G. Martinique, MD  [facility]. [place] office.

## 2019-06-07 NOTE — Patient Instructions (Signed)
A few things to remember from today's visit:   Type 2 diabetes mellitus with diabetic neuropathy, with long-term current use of insulin ([place]) - Plan: Microalbumin / creatinine urine ratio, Hemoglobin A1c  Essential hypertension  Mixed hyperlipidemia - Plan: Lipid panel    Please be sure medication list is accurate. If a new problem present, please set up appointment sooner than planned today.

## 2019-06-09 ENCOUNTER — Encounter: Payer: Self-pay | Admitting: Family Medicine

## 2019-06-10 ENCOUNTER — Encounter: Payer: Self-pay | Admitting: Family Medicine

## 2019-06-10 ENCOUNTER — Other Ambulatory Visit: Payer: Self-pay | Admitting: Internal Medicine

## 2019-06-17 ENCOUNTER — Other Ambulatory Visit: Payer: Self-pay | Admitting: Internal Medicine

## 2019-06-19 ENCOUNTER — Ambulatory Visit
Admission: RE | Admit: 2019-06-19 | Discharge: 2019-06-19 | Disposition: A | Discharge status: Home / Self Care | Payer: Medicare Other | Source: Ambulatory Visit | Attending: Family Medicine | Admitting: Family Medicine

## 2019-06-19 DIAGNOSIS — R222 Localized swelling, mass and lump, trunk: Secondary | ICD-10-CM

## 2019-06-19 DIAGNOSIS — R221 Localized swelling, mass and lump, neck: Secondary | ICD-10-CM | POA: Diagnosis not present

## 2019-06-24 ENCOUNTER — Encounter: Payer: Self-pay | Admitting: Family Medicine

## 2019-07-08 ENCOUNTER — Other Ambulatory Visit: Payer: Self-pay | Admitting: Family Medicine

## 2019-07-21 ENCOUNTER — Other Ambulatory Visit: Payer: Self-pay | Admitting: Family Medicine

## 2019-07-21 DIAGNOSIS — I10 Essential (primary) hypertension: Secondary | ICD-10-CM

## 2019-08-01 ENCOUNTER — Encounter: Payer: Self-pay | Admitting: Family Medicine

## 2019-08-24 ENCOUNTER — Other Ambulatory Visit: Payer: Self-pay | Admitting: Family Medicine

## 2019-10-04 ENCOUNTER — Other Ambulatory Visit: Payer: Self-pay

## 2019-10-07 ENCOUNTER — Other Ambulatory Visit: Payer: Self-pay

## 2019-10-07 ENCOUNTER — Encounter: Payer: Self-pay | Admitting: Family Medicine

## 2019-10-07 ENCOUNTER — Ambulatory Visit (INDEPENDENT_AMBULATORY_CARE_PROVIDER_SITE_OTHER): Payer: Medicare Other | Admitting: Family Medicine

## 2019-10-07 VITALS — BP 130/70 | HR 79 | Temp 97.8°F | Resp 12 | Ht 72.0 in | Wt 190.4 lb

## 2019-10-07 DIAGNOSIS — M25461 Effusion, right knee: Secondary | ICD-10-CM | POA: Insufficient documentation

## 2019-10-07 DIAGNOSIS — M25561 Pain in right knee: Secondary | ICD-10-CM

## 2019-10-07 DIAGNOSIS — E785 Hyperlipidemia, unspecified: Secondary | ICD-10-CM | POA: Diagnosis not present

## 2019-10-07 DIAGNOSIS — R011 Cardiac murmur, unspecified: Secondary | ICD-10-CM

## 2019-10-07 DIAGNOSIS — I10 Essential (primary) hypertension: Secondary | ICD-10-CM | POA: Diagnosis not present

## 2019-10-07 DIAGNOSIS — E1140 Type 2 diabetes mellitus with diabetic neuropathy, unspecified: Secondary | ICD-10-CM

## 2019-10-07 DIAGNOSIS — R350 Frequency of micturition: Secondary | ICD-10-CM | POA: Diagnosis not present

## 2019-10-07 DIAGNOSIS — Z794 Long term (current) use of insulin: Secondary | ICD-10-CM | POA: Diagnosis not present

## 2019-10-07 DIAGNOSIS — M25562 Pain in left knee: Secondary | ICD-10-CM

## 2019-10-07 DIAGNOSIS — L03115 Cellulitis of right lower limb: Secondary | ICD-10-CM

## 2019-10-07 DIAGNOSIS — G8929 Other chronic pain: Secondary | ICD-10-CM

## 2019-10-07 DIAGNOSIS — R9431 Abnormal electrocardiogram [ECG] [EKG]: Secondary | ICD-10-CM

## 2019-10-07 LAB — URINALYSIS, ROUTINE W REFLEX MICROSCOPIC
Bilirubin Urine: NEGATIVE
Ketones, ur: NEGATIVE
Leukocytes,Ua: NEGATIVE
Nitrite: NEGATIVE
Specific Gravity, Urine: <=1.005 — AB (ref 1.000–1.030)
Urine Glucose: NEGATIVE
Urobilinogen, UA: 0.2 (ref 0.0–1.0)
WBC, UA: NONE SEEN (ref 0–?)
pH: 5.5 (ref 5.0–8.0)

## 2019-10-07 LAB — LIPID PANEL
Cholesterol: 291 mg/dL — ABNORMAL HIGH (ref 0–200)
HDL: 37.2 mg/dL — ABNORMAL LOW (ref >39.00)
NonHDL: 253.58
Total CHOL/HDL Ratio: 8
Triglycerides: 263 mg/dL — ABNORMAL HIGH (ref 0.0–149.0)
VLDL: 52.6 mg/dL — ABNORMAL HIGH (ref 0.0–40.0)

## 2019-10-07 LAB — BASIC METABOLIC PANEL
BUN: 35 mg/dL — ABNORMAL HIGH (ref 6–23)
CO2: 28 mEq/L (ref 19–32)
Calcium: 9.6 mg/dL (ref 8.4–10.5)
Chloride: 101 mEq/L (ref 96–112)
Creatinine, Ser: 1.6 mg/dL — ABNORMAL HIGH (ref 0.40–1.20)
GFR: 39.02 mL/min — ABNORMAL LOW (ref >60.00)
Glucose, Bld: 105 mg/dL — ABNORMAL HIGH (ref 70–99)
Potassium: 3.6 mEq/L (ref 3.5–5.1)
Sodium: 138 mEq/L (ref 135–145)

## 2019-10-07 LAB — MICROALBUMIN / CREATININE URINE RATIO
Creatinine,U: 32.1 mg/dL
Microalb Creat Ratio: 45.2 mg/g — ABNORMAL HIGH (ref 0.0–30.0)
Microalb, Ur: 14.5 mg/dL — ABNORMAL HIGH (ref 0.0–1.9)

## 2019-10-07 LAB — LDL CHOLESTEROL, DIRECT: Direct LDL: 158 mg/dL

## 2019-10-07 LAB — HEMOGLOBIN A1C: Hgb A1c MFr Bld: 8.8 % — ABNORMAL HIGH (ref 4.6–6.5)

## 2019-10-07 MED ORDER — DOXYCYCLINE HYCLATE 100 MG PO TABS: 100.0000 mg | ORAL_TABLET | Freq: Two times a day (BID) | ORAL | 0 refills | Status: AC

## 2019-10-07 NOTE — Progress Notes (Signed)
HPI:   Kimberly [name] is a 64 y.o. female, who is here today for 4 months follow up.   She was last seen on on 02/04/19.  DM II: She is not checking BS's. She is on Lantus 60 U daily and Janumet XR 50-1000 mg daily. BS's 130-150's mainly. Occasionally 70's. + Microalbuminuria.  Negative for polydipsia. + Polyuria, or polyphagia.  Lab Results  Component Value Date   HGBA1C 9.0 (H) 06/07/2019   Lab Results  Component Value Date   MICROALBUR 39.3 (H) 06/07/2019   MICROALBUR 6.0 (H) 06/25/2018   Peripheral neuropathy: She feels like LE's are week. No falls since her last visit. Having knee pain, R>L. No erythema. Right knee with mild effusion.  Exacerbated by prolonged standing.  HLD: She is not on pharmacologic treatment.  Lab Results  Component Value Date   CHOL 305 (H) 06/07/2019   HDL 45.40 06/07/2019   LDLCALC 103 05/18/2016   LDLDIRECT 193.0 06/07/2019   TRIG 212.0 (H) 06/07/2019   CHOLHDL 7 06/07/2019   HTN: Amlodipine 5 mg daily. Negative for severe/frequent headache, visual changes, chest pain, dyspnea, palpitation, claudication, focal weakness, or edema.  Lab Results  Component Value Date   CREATININE 1.17 01/23/2019   BUN 20 01/23/2019   NA 136 01/23/2019   K 4.1 01/23/2019   CL 98 01/23/2019   CO2 29 01/23/2019   -Urinary frequency for a while. +Urgency, "can not hold it." She is wearing depends. No dysuria or gross hematuria.  She is eating healthier. Baking food, eats salmon.  COVID 19 x 2, pfizer.   -Localized skin erythema and tenderness on right ankle noted 2 days ago. Getting worse. No Hx of trauma. She has not noted fever,chills, or myalgias. No known insect bite.  Review of Systems  Constitutional: Positive for fatigue. Negative for activity change and appetite change.  HENT: Negative for mouth sores, nosebleeds and sore throat.   Eyes: Negative for pain and redness.  Respiratory: Negative for cough and  wheezing.   Gastrointestinal: Negative for abdominal pain, nausea and vomiting.       Negative for changes in bowel habits.  Genitourinary: Negative for decreased urine volume, difficulty urinating and flank pain.  Musculoskeletal: Positive for arthralgias and gait problem. Negative for joint swelling.  Skin: Negative for wound.  Neurological: Negative for syncope and facial asymmetry.  Rest of ROS, see pertinent positives sand negatives in HPI  Current Outpatient Medications on File Prior to Visit  Medication Sig Dispense Refill  . Accu-Chek FastClix Lancets MISC Use to test blood sugar 4 times daily 102 each 3  . amLODipine (NORVASC) 5 MG tablet TAKE ONE TABLET BY MOUTH DAILY 90 tablet 2  . benzonatate (TESSALON) 200 MG capsule TAKE ONE CAPSULE BY MOUTH THREE TIMES A DAY AS NEEDED FOR COUGH 30 capsule 0  . blood glucose meter kit and supplies KIT Use to check blood sugar 4 times a day 1 each 0  . Blood Glucose Monitoring Suppl (ACCU-CHEK AVIVA CONNECT) w/Device KIT 1 Device by Does not apply route daily. Use to test blood sugar 4 times daily 1 kit 0  . Blood Glucose Monitoring Suppl (ACCU-CHEK AVIVA PLUS) w/Device KIT Use to test blood sugar 4 times daily 1 kit 0  . glucose blood (ACCU-CHEK AVIVA PLUS) test strip Use to test blood sugar 4 times daily 100 each 3  . hydrochlorothiazide (HYDRODIURIL) 25 MG tablet TAKE ONE TABLET BY MOUTH DAILY 90 tablet 2  .  JANUMET XR 50-1000 MG TB24 TAKE ONE TABLET BY MOUTH DAILY 90 tablet 1  . NON FORMULARY [facility] Peripheral Neuropathy Cream: Bupivacaine 1%; Doxepin 3%; Gabapentin 6%; Pentoxifylline 3%; Topiramate 1%  Faxed over to [facility] - 05/02/19 with 5 refills    . SYMBICORT 160-4.5 MCG/ACT inhaler INHALE TWO PUFFS BY MOUTH TWO TIMES A DAY 10.2 g 3  . BIOTIN PO Take by mouth.    . Cholecalciferol (D3 VITAMIN PO) Take by mouth.    . ECHINACEA PO Take 760 mg by mouth 2 (two) times daily.    Marland Kitchen VITAMIN E PO Take by mouth.      No current facility-administered medications on file prior to visit.   Past Medical History:  Diagnosis Date  . Cataract    OD  . Diabetes mellitus 2001  . Hyperlipidemia Dx 2012  . Hypertension 2001   Allergies  Allergen Reactions  . Ace Inhibitors Cough   Past Surgical History:  Procedure Laterality Date  . ABDOMINAL HYSTERECTOMY    . CATARACT EXTRACTION Left   . EYE SURGERY Left    Cat Sx   Family History  Problem Relation Age of Onset  . Depression Mother   . Hypertension Mother   . Heart disease Father   . Cancer Sister   . Cancer Brother     Social History   Socioeconomic History  . Marital status: Divorced    Spouse name: Not on file  . Number of children: Not on file  . Years of education: Not on file  . Highest education level: Not on file  Occupational History  . Not on file  Tobacco Use  . Smoking status: Never Smoker  . Smokeless tobacco: Never Used  Substance and Sexual Activity  . Alcohol use: Yes    Comment: occ  . Drug use: No  . Sexual activity: Not on file  Other Topics Concern  . Not on file  Social History Narrative  . Not on file   Social Determinants of Health   Financial Resource Strain:   . Difficulty of Paying Living Expenses:   Food Insecurity:   . Worried About Charity fundraiser in the Last Year:   . Arboriculturist in the Last Year:   Transportation Needs:   . Film/video editor (Medical):   Marland Kitchen Lack of Transportation (Non-Medical):   Physical Activity:   . Days of Exercise per Week:   . Minutes of Exercise per Session:   Stress:   . Feeling of Stress :   Social Connections:   . Frequency of Communication with Friends and Family:   . Frequency of Social Gatherings with Friends and Family:   . Attends Religious Services:   . Active Member of Clubs or Organizations:   . Attends Archivist Meetings:   Marland Kitchen Marital Status:     Vitals:   10/07/19 0901  BP: 130/70  Pulse: 79  Resp: 12  Temp: 97.8 F  (36.6 C)  SpO2: 96%   Wt Readings from Last 3 Encounters:  10/07/19 190 lb 6 oz (86.4 kg)  06/07/19 192 lb (87.1 kg)  02/04/19 190 lb (86.2 kg)    Body mass index is 25.82 kg/m.  Physical Exam  Nursing note and vitals reviewed. Constitutional: She is oriented to person, place, and time. She appears well-developed. No distress.  HENT:  Head: Normocephalic and atraumatic.  Mouth/Throat: Oropharynx is clear and moist and mucous membranes are normal.  Eyes: Pupils  are equal, round, and reactive to light. Conjunctivae are normal.  Neck: Carotid bruit is not present.    Cardiovascular: Normal rate and regular rhythm.  Murmur heard.  Diastolic murmur is present with a grade of 1/6. Pulses:      Dorsalis pedis pulses are 2+ on the right side and 2+ on the left side.  Respiratory: Effort normal and breath sounds normal. No respiratory distress.  GI: Soft. She exhibits no mass. There is no hepatomegaly. There is no abdominal tenderness.  Musculoskeletal:        General: No edema.     Right knee: Effusion present. No deformity or erythema. Normal range of motion. Tenderness present.       Feet:     Comments: Crepitus of knees with ROM, R>L/ Normal ROM, elicits pain.  Lymphadenopathy:    She has no cervical adenopathy.  Neurological: She is alert and oriented to person, place, and time. She has normal strength. No cranial nerve deficit. Gait normal.  Skin: Skin is warm. No rash noted. There is erythema.  Above medial malleolus right ankle macular, rounded,erythematous and tender area. No induration or local edema. See foot graphic for localization.  Psychiatric: She has a normal mood and affect.  Well groomed, good eye contact.   ASSESSMENT AND PLAN:   Kimberly [name] was seen today for 4 months follow-up.  Orders Placed This Encounter  Procedures  . Basic metabolic panel  . Microalbumin / creatinine urine ratio  . Hemoglobin A1c  . Lipid panel  . Urinalysis,  Routine w reflex microscopic  . LDL cholesterol, direct  . Ambulatory referral to Physical Therapy  . Ambulatory referral to Cardiology  . EKG 12-Lead  . ECHOCARDIOGRAM COMPLETE   Lab Results  Component Value Date   HGBA1C 8.8 (H) 10/07/2019   Lab Results  Component Value Date   MICROALBUR 14.5 (H) 10/07/2019   MICROALBUR 39.3 (H) 06/07/2019    Lab Results  Component Value Date   CHOL 291 (H) 10/07/2019   HDL 37.20 (L) 10/07/2019   LDLCALC 103 05/18/2016   LDLDIRECT 158.0 10/07/2019   TRIG 263.0 (H) 10/07/2019   CHOLHDL 8 10/07/2019    Lab Results  Component Value Date   CREATININE 1.60 (H) 10/07/2019   BUN 35 (H) 10/07/2019   NA 138 10/07/2019   K 3.6 10/07/2019   CL 101 10/07/2019   CO2 28 10/07/2019    Type 2 diabetes mellitus with diabetic neuropathy, with long-term current use of insulin ([place]) HgA1C has not been at goal. No changes in current management, will adjust management according to A1C results. Regular exercise and healthy diet with avoidance of added sugar food intake is an important part of treatment and recommended. Eye exam is current. Periodic dental and foot care recommended. F/U in 4 months  Urinary frequency Possible etiologies discussed. Chronic: Overactive bladder and/or hyperglycemia.  Recommendations will be given according to UA results.  - Urinalysis, Routine w reflex microscopic  Heart murmur New. Possible etiologies discussed. EKG done today:SR, PVC's, LAD, AFHB, ST elevation anterior leads, and inverted T wave lateral leads. Compared with EKG done on 01/23/19, PVC's and inverted T waves laterally are now present. ST elevation and LAD also seen on prior EKG. Not symptomatic. Clearly instructed about warning signs. She voices understanding.  Cellulitis of right lower extremity Mild. Some side effects of abx discussed. Instructed about warning signs.  - doxycycline (VIBRA-TABS) 100 MG tablet; Take 1 tablet (100 mg total) by  mouth  2 (two) times daily for 7 days.  Dispense: 14 tablet; Refill: 0  Abnormal EKG - Ambulatory referral to Cardiology  Hyperlipidemia She has refused taking statin meds. We discussed benefits. Further recommendations according to lipid panel results.   Hypertension BP adequately controlled. No changes in current management. Continue low salt diet. Recommend monitoring BP regularly.  Chronic pain of both knees Most likely OA. I do not think imaging is needed today. Fall prevention discussed. PT will be arranged.  40 min face to face OV. > 50% was dedicated to discussion of above Dx's, prognosis, possible complications, treatment options, and some side effects of medications.   Return in about 4 months (around 02/06/2020) for DM II,HLD,HTN.    Lissandra Keil G. Martinique, MD  [facility]. [place] office.  A few things to remember from today's visit: No changes in your chronic meds today. Antibiotic was prescribed to treat possible cellulitis right foot. Continue monitoring for changes.  We must controlled diabetes better to prevent kidney disease.  Because today I heart a murmur, heart ultrasound will be arranged.  Please be sure medication list is accurate. If a new problem present, please set up appointment sooner than planned today

## 2019-10-07 NOTE — Assessment & Plan Note (Signed)
BP adequately controlled. No changes in current management. Continue low salt diet. Recommend monitoring BP regularly.

## 2019-10-07 NOTE — Patient Instructions (Addendum)
A few things to remember from today's visit:   Hyperlipidemia, unspecified hyperlipidemia type  Type 2 diabetes mellitus with diabetic neuropathy, with long-term current use of insulin ([place]), Chronic - Plan: Basic metabolic panel, Microalbumin / creatinine urine ratio, Hemoglobin A1c, Lipid panel, EKG 12-Lead  Essential hypertension - Plan: EKG 12-Lead  Urinary frequency - Plan: Urinalysis, Routine w reflex microscopic  Heart murmur - Plan: EKG 12-Lead, ECHOCARDIOGRAM COMPLETE  Chronic pain of right knee - Plan: Ambulatory referral to Physical Therapy  Cellulitis of right lower extremity - Plan: doxycycline (VIBRA-TABS) 100 MG tablet  No changes in your chronic meds today. Antibiotic was prescribed to treat possible cellulitis right foot. Continue monitoring for changes.  We mush controlled diabetes better to prevent kidney disease.  Because today I heart a murmur, heart ultrasound will be arranged.  Please be sure medication list is accurate. If a new problem present, please set up appointment sooner than planned today.

## 2019-10-07 NOTE — Assessment & Plan Note (Signed)
Most likely OA. I do not think imaging is needed today. Fall prevention discussed. PT will be arranged.

## 2019-10-07 NOTE — Assessment & Plan Note (Signed)
She has refused taking statin meds. We discussed benefits. Further recommendations according to lipid panel results.

## 2019-10-08 ENCOUNTER — Encounter: Payer: Self-pay | Admitting: Family Medicine

## 2019-10-08 ENCOUNTER — Other Ambulatory Visit: Payer: Self-pay | Admitting: Family Medicine

## 2019-10-08 ENCOUNTER — Telehealth: Payer: Self-pay | Admitting: Family Medicine

## 2019-10-08 ENCOUNTER — Other Ambulatory Visit: Payer: Self-pay | Admitting: Internal Medicine

## 2019-10-08 NOTE — Telephone Encounter (Signed)
Please advise if you want patient to try to get the [facility].

## 2019-10-08 NOTE — Telephone Encounter (Signed)
Pt is calling in stating that her glucose meter is no longer covered by her insurance and would like to have a Free Style meter (Libre meter) that she does not have to stick herself she is not sure if her insurance will pay for it.  Pt will call [employer] to see if they will cover that meter and will give [place] a call back.

## 2019-10-08 NOTE — Telephone Encounter (Signed)
Medication Refill: Benzonatate [facility]: [facility] [place]: [phone]   Pt states that her Dr. Melvyn Novas informed her that her PCP should fill it from here on out. She states that insurance does not cover it but she will still pay for it and to go ahead to have it filled

## 2019-10-08 NOTE — Telephone Encounter (Signed)
Pt stated that she called her insurance and they will be sending her a letter with what is approved by them

## 2019-10-09 ENCOUNTER — Other Ambulatory Visit: Payer: Self-pay | Admitting: Internal Medicine

## 2019-10-09 ENCOUNTER — Other Ambulatory Visit: Payer: Self-pay | Admitting: Family Medicine

## 2019-10-09 ENCOUNTER — Other Ambulatory Visit: Payer: Self-pay | Admitting: *Deleted

## 2019-10-09 DIAGNOSIS — M25561 Pain in right knee: Secondary | ICD-10-CM | POA: Diagnosis not present

## 2019-10-09 DIAGNOSIS — M25562 Pain in left knee: Secondary | ICD-10-CM | POA: Diagnosis not present

## 2019-10-09 DIAGNOSIS — Z794 Long term (current) use of insulin: Secondary | ICD-10-CM

## 2019-10-09 DIAGNOSIS — M25651 Stiffness of right hip, not elsewhere classified: Secondary | ICD-10-CM | POA: Diagnosis not present

## 2019-10-09 DIAGNOSIS — M25461 Effusion, right knee: Secondary | ICD-10-CM | POA: Diagnosis not present

## 2019-10-09 DIAGNOSIS — E1140 Type 2 diabetes mellitus with diabetic neuropathy, unspecified: Secondary | ICD-10-CM

## 2019-10-09 MED ORDER — LANTUS SOLOSTAR 100 UNIT/ML ~~LOC~~ SOPN: PEN_INJECTOR | SUBCUTANEOUS | 3 refills | Status: DC

## 2019-10-09 MED ORDER — FREESTYLE LIBRE SENSOR SYSTEM MISC: 1.0000 | Freq: Three times a day (TID) | 2 refills | Status: DC | PRN

## 2019-10-09 MED ORDER — FREESTYLE LIBRE READER DEVI: 1.0000 | 3 refills | Status: DC

## 2019-10-09 NOTE — Telephone Encounter (Signed)
Rx for [employer] sent to her pharmacy. Thanks, BJ

## 2019-10-09 NOTE — Telephone Encounter (Signed)
Request for medication refill pending approval from PCP.

## 2019-10-09 NOTE — Telephone Encounter (Signed)
Please advise on if it's okay to send refill.

## 2019-10-10 ENCOUNTER — Encounter: Payer: Self-pay | Admitting: Family Medicine

## 2019-10-10 NOTE — Telephone Encounter (Signed)
Pt called in stating that she has not received an answer from Dr. Martinique.  Pt is aware that Dr. Martinique and her assistance is not working on Thursday.  Pt wanted to know what to do about her foot and she declined to have an appointment with another provider.  Pt wanted to know what to do about her foot since she declined to speak with another provider.

## 2019-10-11 ENCOUNTER — Ambulatory Visit (INDEPENDENT_AMBULATORY_CARE_PROVIDER_SITE_OTHER): Payer: Medicare Other | Admitting: Family Medicine

## 2019-10-11 ENCOUNTER — Encounter: Payer: Self-pay | Admitting: Family Medicine

## 2019-10-11 ENCOUNTER — Ambulatory Visit (INDEPENDENT_AMBULATORY_CARE_PROVIDER_SITE_OTHER): Payer: Medicare Other

## 2019-10-11 ENCOUNTER — Other Ambulatory Visit: Payer: Self-pay

## 2019-10-11 VITALS — BP 132/76 | HR 82 | Temp 98.1°F | Ht 72.0 in | Wt 187.0 lb

## 2019-10-11 DIAGNOSIS — R600 Localized edema: Secondary | ICD-10-CM | POA: Diagnosis not present

## 2019-10-11 DIAGNOSIS — E1165 Type 2 diabetes mellitus with hyperglycemia: Secondary | ICD-10-CM

## 2019-10-11 DIAGNOSIS — N183 Chronic kidney disease, stage 3 unspecified: Secondary | ICD-10-CM

## 2019-10-11 DIAGNOSIS — E1142 Type 2 diabetes mellitus with diabetic polyneuropathy: Secondary | ICD-10-CM | POA: Diagnosis not present

## 2019-10-11 DIAGNOSIS — M25473 Effusion, unspecified ankle: Secondary | ICD-10-CM

## 2019-10-11 DIAGNOSIS — N184 Chronic kidney disease, stage 4 (severe): Secondary | ICD-10-CM | POA: Insufficient documentation

## 2019-10-11 DIAGNOSIS — Z794 Long term (current) use of insulin: Secondary | ICD-10-CM | POA: Diagnosis not present

## 2019-10-11 DIAGNOSIS — M7731 Calcaneal spur, right foot: Secondary | ICD-10-CM | POA: Diagnosis not present

## 2019-10-11 DIAGNOSIS — E1140 Type 2 diabetes mellitus with diabetic neuropathy, unspecified: Secondary | ICD-10-CM | POA: Diagnosis not present

## 2019-10-11 DIAGNOSIS — E785 Hyperlipidemia, unspecified: Secondary | ICD-10-CM | POA: Diagnosis not present

## 2019-10-11 MED ORDER — EMPAGLIFLOZIN-METFORMIN HCL 5-1000 MG PO TABS: 1.0000 | ORAL_TABLET | Freq: Every day | ORAL | 2 refills | Status: DC

## 2019-10-11 MED ORDER — PREDNISONE 20 MG PO TABS: 20.0000 mg | ORAL_TABLET | Freq: Every day | ORAL | 0 refills | Status: AC

## 2019-10-11 MED ORDER — PRAVASTATIN SODIUM 20 MG PO TABS: ORAL_TABLET | ORAL | 3 refills | Status: DC

## 2019-10-11 MED ORDER — BENZONATATE 200 MG PO CAPS: ORAL_CAPSULE | ORAL | 3 refills | Status: DC

## 2019-10-11 NOTE — Assessment & Plan Note (Signed)
Adequate hydration, low salt diet. Avoid NSAID's. Glucose needs to be better controlled.  For now no changes in Metformin.

## 2019-10-11 NOTE — Assessment & Plan Note (Signed)
After educated about benefits of statin, she agres with trying Pravastatin 20 mg 3 tiems per week. Continue low fat diet.

## 2019-10-11 NOTE — Progress Notes (Signed)
Chief Complaint  Patient presents with  . Joint Swelling  . lab results    Discuss treatmet options.   HPI: Kimberly [name] is a 64 y.o. female, who is here today complaining of worsening right ankle pain. She was last seen on 10/07/19, when she was c/o 2 days of ankle erythema and pain. Doxycycline was recommended for possible cellulitis. Now she has medial ankle edema. She has not noted fever,chills,or skin ulcers.  No hx of gout.  No recent trauma.  HLD: Recommend Atorvastatin or Crestor max dose. She took Crestor before and caused LE achy pain.  Lab Results  Component Value Date   CHOL 291 (H) 10/07/2019   HDL 37.20 (L) 10/07/2019   LDLCALC 103 05/18/2016   LDLDIRECT 158.0 10/07/2019   TRIG 263.0 (H) 10/07/2019   CHOLHDL 8 10/07/2019   CKD III:  + Microalbuminuria. She has not noted gross hematuria, foam in urine,or decreased urine output.  12/2018 e GFR 56 and Cr 1.17.  Lab Results  Component Value Date   MICROALBUR 14.5 (H) 10/07/2019   MICROALBUR 39.3 (H) 06/07/2019    Lab Results  Component Value Date   CREATININE 1.60 (H) 10/07/2019   BUN 35 (H) 10/07/2019   NA 138 10/07/2019   K 3.6 10/07/2019   CL 101 10/07/2019   CO2 28 10/07/2019   She is on Lantus 60 U daily and Janumet XR 50-1000 mg daily. I recommended adding Humalog 5-10 U before meals. She does not want to add medications.   Lab Results  Component Value Date   HGBA1C 8.8 (H) 10/07/2019    Review of Systems  Constitutional: Positive for fatigue. Negative for activity change and appetite change.  Respiratory: Negative for cough, shortness of breath and wheezing.   Cardiovascular: Negative for chest pain, palpitations and leg swelling.  Gastrointestinal: Negative for abdominal pain, nausea and vomiting.       Negative for changes in bowel habits.  Genitourinary: Negative for dysuria.  Musculoskeletal: Positive for gait problem.  Skin: Negative for wound.  Neurological:  Negative for syncope, weakness and headaches.  Psychiatric/Behavioral: Negative for confusion. The patient is nervous/anxious.   Rest see pertinent positives and negatives per HPI.   Current Outpatient Medications on File Prior to Visit  Medication Sig Dispense Refill  . Accu-Chek FastClix Lancets MISC Use to test blood sugar 4 times daily 102 each 3  . amLODipine (NORVASC) 5 MG tablet TAKE ONE TABLET BY MOUTH DAILY 90 tablet 2  . BD PEN NEEDLE NANO U/F 32G X 4 MM MISC USE AS DIRECTED ONCE DAILY 100 each 4  . blood glucose meter kit and supplies KIT Use to check blood sugar 4 times a day 1 each 0  . Blood Glucose Monitoring Suppl (ACCU-CHEK AVIVA CONNECT) w/Device KIT 1 Device by Does not apply route daily. Use to test blood sugar 4 times daily 1 kit 0  . Blood Glucose Monitoring Suppl (ACCU-CHEK AVIVA PLUS) w/Device KIT Use to test blood sugar 4 times daily 1 kit 0  . Continuous Blood Gluc Receiver (FREESTYLE LIBRE READER) DEVI 1 Device by Does not apply route as directed. 6 each 3  . Continuous Blood Gluc Sensor (FREESTYLE LIBRE SENSOR SYSTEM) MISC 1 Device by Does not apply route 3 (three) times daily as needed. 6 each 2  . doxycycline (VIBRA-TABS) 100 MG tablet Take 1 tablet (100 mg total) by mouth 2 (two) times daily for 7 days. 14 tablet 0  .  glucose blood (ACCU-CHEK AVIVA PLUS) test strip Use to test blood sugar 4 times daily 100 each 3  . hydrochlorothiazide (HYDRODIURIL) 25 MG tablet TAKE ONE TABLET BY MOUTH DAILY 90 tablet 2  . insulin glargine (LANTUS SOLOSTAR) 100 UNIT/ML Solostar Pen INJECT 60 UNITS INTO THE SKIN EVERY NIGHT AT BEDTIME 15 mL 3  . NON FORMULARY [facility] Peripheral Neuropathy Cream: Bupivacaine 1%; Doxepin 3%; Gabapentin 6%; Pentoxifylline 3%; Topiramate 1%  Faxed over to [facility] - 05/02/19 with 5 refills    . SYMBICORT 160-4.5 MCG/ACT inhaler INHALE TWO PUFFS BY MOUTH TWO TIMES A DAY 10.2 g 3   No current facility-administered medications on  file prior to visit.    Past Medical History:  Diagnosis Date  . Cataract    OD  . Diabetes mellitus 2001  . Hyperlipidemia Dx 2012  . Hypertension 2001   Allergies  Allergen Reactions  . Ace Inhibitors Cough    Social History   Socioeconomic History  . Marital status: Divorced    Spouse name: Not on file  . Number of children: Not on file  . Years of education: Not on file  . Highest education level: Not on file  Occupational History  . Not on file  Tobacco Use  . Smoking status: Never Smoker  . Smokeless tobacco: Never Used  Substance and Sexual Activity  . Alcohol use: Yes    Comment: occ  . Drug use: No  . Sexual activity: Not on file  Other Topics Concern  . Not on file  Social History Narrative  . Not on file   Social Determinants of Health   Financial Resource Strain:   . Difficulty of Paying Living Expenses:   Food Insecurity:   . Worried About Charity fundraiser in the Last Year:   . Arboriculturist in the Last Year:   Transportation Needs:   . Film/video editor (Medical):   Marland Kitchen Lack of Transportation (Non-Medical):   Physical Activity:   . Days of Exercise per Week:   . Minutes of Exercise per Session:   Stress:   . Feeling of Stress :   Social Connections:   . Frequency of Communication with Friends and Family:   . Frequency of Social Gatherings with Friends and Family:   . Attends Religious Services:   . Active Member of Clubs or Organizations:   . Attends Archivist Meetings:   Marland Kitchen Marital Status:     Vitals:   10/11/19 1542  BP: 132/76  Pulse: 82  Temp: 98.1 F (36.7 C)  SpO2: 98%   Body mass index is 25.36 kg/m.  Physical Exam  Nursing note and vitals reviewed. Constitutional: She is oriented to person, place, and time. She appears well-developed. No distress.  HENT:  Head: Normocephalic and atraumatic.  Eyes: Conjunctivae are normal.  Cardiovascular: Normal rate and regular rhythm.  Pulses:      Dorsalis  pedis pulses are 2+ on the right side and 2+ on the left side.  Respiratory: Effort normal and breath sounds normal. No respiratory distress.  Musculoskeletal:        General: No edema.     Right ankle: Swelling present. No deformity.       Feet:     Comments: Medial right ankle with mild edema and erythema.No local heat or induration.  Neurological: She is alert and oriented to person, place, and time. She has normal strength. No cranial nerve deficit. Gait normal.  Skin:  Skin is warm. No rash noted. There is erythema.  Psychiatric: Her mood appears anxious.  Well groomed, good eye contact.    ASSESSMENT AND PLAN:  Kimberly [name] was seen today for joint swelling and lab results.  Diagnoses and all orders for this visit: Orders Placed This Encounter  Procedures  . DG Ankle Complete Right  . Basic metabolic panel  . Uric acid   Lab Results  Component Value Date   LABURIC 8.5 (H) 10/11/2019   Lab Results  Component Value Date   CREATININE 1.61 (H) 10/11/2019   BUN 37 (H) 10/11/2019   NA 140 10/11/2019   K 3.8 10/11/2019   CL 103 10/11/2019   CO2 23 10/11/2019    Ankle edema + Local erythema, ? Gout. Complete abx treatment. LE elevation.  She agrees with short course of Prednisone, side effects discussed. Further recommendations will be given according to imaging result.  Poorly controlled type 2 diabetes mellitus with peripheral neuropathy ([place]) She is not interested in Humulin. Lantus increased from 60 units to 65 units. Janumet discontinued. She agrees with trying low-dose empagliflozin-Metformin 10-998 mg daily.  We will recheck renal function in 3 to 4 weeks, if Cr still > 1.5 and/or eGFR < 145, metformin and Empagliflozin need to be discontinued.  She is not interested in appt with nutritionist.   CKD (chronic kidney disease) stage 3, GFR 30-59 ml/min Adequate hydration, low salt diet. Avoid NSAID's. Glucose needs to be better controlled.  For now no  changes in Metformin.  Hyperlipidemia After educated about benefits of statin, she agres with trying Pravastatin 20 mg 3 tiems per week. Continue low fat diet.    Return for Labs in 3-4 weeks..   Kimberly [name] G. Martinique, MD  [facility]. [place] office.

## 2019-10-11 NOTE — Assessment & Plan Note (Addendum)
She is not interested in Humulin. Lantus increased from 60 units to 65 units. Janumet discontinued. She agrees with trying low-dose empagliflozin-Metformin 10-998 mg daily.  We will recheck renal function in 3 to 4 weeks, if Cr still > 1.5 and/or eGFR < 145, metformin and Empagliflozin need to be discontinued.  She is not interested in appt with nutritionist.

## 2019-10-11 NOTE — Telephone Encounter (Signed)
Patient notified

## 2019-10-11 NOTE — Patient Instructions (Addendum)
A few things to remember from today's visit:  Today we added empagliflozin-Metformin to stay 1 tablet daily. We need to recheck your kidney function in 3 to 4 weeks and if improved we can increase the dose of this medication.  Pravastatin 20 mg to take 3 times per week.  Prednisone to take 1 tablet with breakfast for 3 days, question of gout.   Please be sure medication list is accurate. If a new problem present, please set up appointment sooner than planned today.

## 2019-10-11 NOTE — Telephone Encounter (Signed)
Concerns discussed today. Adlyn Fife Martinique, MD

## 2019-10-11 NOTE — Telephone Encounter (Signed)
Rx sent. Vahan Wadsworth Martinique, MD

## 2019-10-12 ENCOUNTER — Encounter: Payer: Self-pay | Admitting: Family Medicine

## 2019-10-12 LAB — URIC ACID: Uric Acid, Serum: 8.5 mg/dL — ABNORMAL HIGH (ref 2.5–7.0)

## 2019-10-12 LAB — BASIC METABOLIC PANEL
BUN/Creatinine Ratio: 23 (calc) — ABNORMAL HIGH (ref 6–22)
BUN: 37 mg/dL — ABNORMAL HIGH (ref 7–25)
CO2: 23 mmol/L (ref 20–32)
Calcium: 10.1 mg/dL (ref 8.6–10.4)
Chloride: 103 mmol/L (ref 98–110)
Creat: 1.61 mg/dL — ABNORMAL HIGH (ref 0.50–0.99)
Glucose, Bld: 135 mg/dL — ABNORMAL HIGH (ref 65–99)
Potassium: 3.8 mmol/L (ref 3.5–5.3)
Sodium: 140 mmol/L (ref 135–146)

## 2019-10-14 ENCOUNTER — Encounter: Payer: Self-pay | Admitting: Family Medicine

## 2019-10-15 ENCOUNTER — Encounter (HOSPITAL_COMMUNITY): Payer: Self-pay | Admitting: Family Medicine

## 2019-10-15 ENCOUNTER — Encounter: Payer: Self-pay | Admitting: Family Medicine

## 2019-10-15 DIAGNOSIS — Z0120 Encounter for dental examination and cleaning without abnormal findings: Secondary | ICD-10-CM | POA: Diagnosis not present

## 2019-10-25 ENCOUNTER — Encounter: Payer: Self-pay | Admitting: Cardiovascular Disease

## 2019-10-25 ENCOUNTER — Other Ambulatory Visit: Payer: Self-pay

## 2019-10-25 ENCOUNTER — Ambulatory Visit: Payer: Medicare Other | Admitting: Cardiovascular Disease

## 2019-10-25 VITALS — BP 146/70 | HR 96 | Temp 96.8°F | Ht 72.0 in | Wt 185.0 lb

## 2019-10-25 DIAGNOSIS — Z5181 Encounter for therapeutic drug level monitoring: Secondary | ICD-10-CM

## 2019-10-25 DIAGNOSIS — R079 Chest pain, unspecified: Secondary | ICD-10-CM | POA: Diagnosis not present

## 2019-10-25 DIAGNOSIS — E785 Hyperlipidemia, unspecified: Secondary | ICD-10-CM

## 2019-10-25 DIAGNOSIS — I10 Essential (primary) hypertension: Secondary | ICD-10-CM | POA: Diagnosis not present

## 2019-10-25 DIAGNOSIS — M791 Myalgia, unspecified site: Secondary | ICD-10-CM

## 2019-10-25 DIAGNOSIS — T466X5A Adverse effect of antihyperlipidemic and antiarteriosclerotic drugs, initial encounter: Secondary | ICD-10-CM

## 2019-10-25 NOTE — Patient Instructions (Signed)
Medication Instructions:  Your physician recommends that you continue on your current medications as directed. Please refer to the Current Medication list given to you today.  *If you need a refill on your cardiac medications before your next appointment, please call your pharmacy*  Lab Work: FASTING LP/CMET IN A FEW DAYS PRIOR TO YOU FOLLOW UP VISIT   YOU WILL NEED COVID SCREENING TEST 3 DAYS PRIOR TO TY STRESS TEST. ONCE YOU HAVE THIS YOU WILL NEED TO QUARANTINE YOURSELF UNTIL AFTER TEST  If you have labs (blood work) drawn today and your tests are completely normal, you will receive your results only by: Marland Kitchen MyChart Message (if you have MyChart) OR . A paper copy in the mail If you have any lab test that is abnormal or we need to change your treatment, we will call you to review the results.  Testing/Procedures: Your physician has requested that you have an exercise tolerance test. For further information please visit [email or web address]. Please also follow instruction sheet, as given.  Follow-Up: At [facility], you and your health needs are our priority.  As part of our continuing mission to provide you with exceptional heart care, we have created designated Provider Care Teams.  These Care Teams include your primary Cardiologist (physician) and Advanced Practice Providers (APPs -  Physician Assistants and Nurse Practitioners) who all work together to provide you with the care you need, when you need it.  We recommend signing up for the patient portal called "MyChart".  Sign up information is provided on this After Visit Summary.  MyChart is used to connect with patients for Virtual Visits (Telemedicine).  Patients are able to view lab/test results, encounter notes, upcoming appointments, etc.  Non-urgent messages can be sent to your provider as well.   To learn more about what you can do with MyChart, go to [email or web address].    Your next appointment:   3 month(s)  The format  for your next appointment:   In Person  Provider:   You may see DR [facility]  or one of the following Advanced Practice Providers on your designated Care Team:    Kerin Ransom, PA-C  [place], [place]  Coletta Memos, [place]  Other Instructions  TRY TO EXERCISE 150 MINUTES [facility] WEEK    Exercise Stress Test An exercise stress test is a test to check how your heart works during exercise. You will need to walk on a treadmill or ride an exercise bike for this test. An electrocardiogram (ECG) will record your heartbeat when you are at rest and when you are exercising. You may have an ultrasound or nuclear test after the exercise test. The test is done to check for coronary artery disease (CAD). It is also done to:  See how well you can exercise.  Watch for high blood pressure during exercise.  Test how well you can exercise after treatment.  Check the blood flow to your arms and legs. If your test result is not normal, more testing may be needed. What happens before the procedure?  Follow instructions from your doctor about what you cannot eat or drink. ? Do not have any drinks or foods that have caffeine in them for 24 hours before the test, or as told by your doctor. This includes coffee, tea (even decaf tea), sodas, chocolate, and cocoa.  Ask your doctor about changing or stopping your normal medicines. This is important if you: ? Take diabetes medicines. ? Take beta-blocker medicines. ? Wear a nitroglycerin patch.  If you use an inhaler, bring it with you to the test.  Do not put lotions, powders, creams, or oils on your chest before the test.  Wear comfortable shoes and clothing.  Do not use any products that have nicotine or tobacco in them, such as cigarettes and e-cigarettes. Stop using them at least 4 hours before the test. If you need help quitting, ask your doctor. What happens during the procedure?   Patches (electrodes) will be put on your chest.  Wires will be  connected to the patches. The wires will send signals to a machine to record your heartbeat.  Your heart rate will be watched while you are resting and while you are exercising. Your blood pressure will also be watched during the test.  You will walk on a treadmill or use a stationary bike. If you cannot use these, you may be asked to turn a crank with your hands.  The activity will get harder and will raise your heart rate.  You may be asked to breathe into a tube a few times during the test. This measures the gases that you breathe out.  You will be asked how you are feeling throughout the test.  You will exercise until your heart reaches a target heart rate. You will stop early if: ? You feel dizzy. ? You have chest pain. ? You are out of breath. ? Your blood pressure is too high or too low. ? You have an irregular heartbeat. ? You have pain or aching in your arms or legs. The procedure may vary among doctors and hospitals. What happens after the procedure?  Your blood pressure, heart rate, breathing rate, and blood oxygen level will be watched after the test.  You may return to your normal diet and activities as told by your doctor.  It is up to you to get the results of your test. Ask your doctor, or the department that is doing the test, when your results will be ready. Summary  An exercise stress test is a test to check how your heart works during exercise.  This test is done to check for coronary artery disease.  Your heart rate will be watched while you are resting and while you are exercising.  Follow instructions from your doctor about what you cannot eat or drink before the test. This information is not intended to replace advice given to you by your health care provider. Make sure you discuss any questions you have with your health care provider. Document Revised: 09/25/2018 Document Reviewed: 09/13/2016 Elsevier Patient Education  [place].

## 2019-10-25 NOTE — Progress Notes (Signed)
Cardiology Office Note   Date:  11/06/2019   ID:  Kimberly [name], Kimberly [name] Apr 11, 1956, MRN [id number]  PCP:  Kimberly [name], Kimberly G, MD  Cardiologist:   Kimberly Latch, MD   Chief Complaint  [name]  . New [name] (Initial Visit)    ABN EKG.  . Heart Murmur      History of Present Illness: Kimberly [name] is a 64 y.o. female with hypertension, hyperlipidemia, GERD, CKD 3, diabetes, and cough-variant asthma who is being seen today for Kimberly evaluation of chest pain shortness of breath at Kimberly request of Kimberly [name], Kimberly G, MD.  Kimberly [name] recently saw Dr. Martinique 09/2019 and was noted to have a murmur on exam.  Kimberly [name] was referred to cardiology for further evaluation.  Overall Kimberly [name] has felt well.  Kimberly [name] does notice some intermittent pain in her left arm that has been occurring for several weeks.  It happens at rest or when laying in Kimberly bed and last for a few seconds at a time.  Additionally, Kimberly [name] notes some chest tightness that occur separately.  Kimberly [name] thinks this is related to stress.  There is no associated shortness of breath, nausea, or diaphoresis.  It also last for seconds and gets better when Kimberly [name] is walking.  Kimberly [name] tries to walk some for exercise and has no exertional chest pain but does get short of breath.  In Kimberly past Kimberly [name] was on simvastatin but did not tolerate it due to myalgias.  At her last appointment with Dr. Martinique Kimberly [name] was started on pravastatin 3 times per week and seems to be tolerating this okay.  Kimberly [name] has been trying to make healthy eating choices and increasing her seafood intake.  Kimberly [name] does not cook with salt.  Kimberly [name] cooks most of her meals at home.  Kimberly [name] has no lower extremity edema, orthopnea, or PND.   Past Medical History:  Diagnosis Date  . Cataract    OD  . Diabetes mellitus 2001  . Hyperlipidemia Dx 2012  . Hypertension 2001    Past Surgical History:  Procedure Laterality Date  . ABDOMINAL HYSTERECTOMY    . CATARACT EXTRACTION Left   . EYE SURGERY Left    Cat Sx      Current Outpatient Medications  Medication Sig Dispense Refill  . Accu-Chek FastClix Lancets MISC Use to test blood sugar 4 times daily 102 each 3  . amLODipine (NORVASC) 5 MG tablet TAKE ONE TABLET BY MOUTH DAILY 90 tablet 2  . BD PEN NEEDLE NANO U/F 32G X 4 MM MISC USE AS DIRECTED ONCE DAILY 100 each 4  . benzonatate (TESSALON) 200 MG capsule TAKE ONE CAPSULE BY MOUTH THREE TIMES A DAY AS NEEDED FOR COUGH 30 capsule 3  . blood glucose meter kit and supplies KIT Use to check blood sugar 4 times a day 1 each 0  . Blood Glucose Monitoring Suppl (ACCU-CHEK AVIVA CONNECT) w/Device KIT 1 Device by Does not apply route daily. Use to test blood sugar 4 times daily 1 kit 0  . Blood Glucose Monitoring Suppl (ACCU-CHEK AVIVA PLUS) w/Device KIT Use to test blood sugar 4 times daily 1 kit 0  . Continuous Blood Gluc Receiver (FREESTYLE LIBRE READER) DEVI 1 Device by Does not apply route as directed. 6 each 3  . Continuous Blood Gluc Sensor (FREESTYLE LIBRE SENSOR SYSTEM) MISC 1 Device by Does not apply route 3 (three) times daily as needed. 6 each 2  . Empagliflozin-metFORMIN HCl 10-998 MG TABS Take 1  tablet by mouth daily with breakfast. 30 tablet 2  . glucose blood (ACCU-CHEK AVIVA PLUS) test strip Use to test blood sugar 4 times daily 100 each 3  . hydrochlorothiazide (HYDRODIURIL) 25 MG tablet TAKE ONE TABLET BY MOUTH DAILY 90 tablet 2  . insulin glargine (LANTUS SOLOSTAR) 100 UNIT/ML Solostar Pen INJECT 60 UNITS INTO Kimberly SKIN EVERY NIGHT AT BEDTIME 15 mL 3  . NON FORMULARY [facility] Peripheral Neuropathy Cream: Bupivacaine 1%; Doxepin 3%; Gabapentin 6%; Pentoxifylline 3%; Topiramate 1%  Faxed over to [facility] - 05/02/19 with 5 refills    . pravastatin (PRAVACHOL) 20 MG tablet 1 tab 3 times per week. 13 tablet 3  . SYMBICORT 160-4.5 MCG/ACT inhaler INHALE TWO PUFFS BY MOUTH TWO TIMES A DAY 10.2 [name] 3   No current facility-administered medications for this visit.     Allergies:   Ace inhibitors    Social History:  Kimberly [name]  reports that Kimberly [name] has never smoked. Kimberly [name] has never used smokeless tobacco. Kimberly [name] reports current alcohol use. Kimberly [name] reports that Kimberly [name] does not use drugs.   Family History:  Kimberly [name]'s family history includes Depression in her mother; Diabetes in her mother; Heart attack (age of onset: 19) in her father; Heart disease in her father; Hypertension in her mother; Lung cancer in her brother; Uterine cancer in her sister.    ROS:  Please see Kimberly history of present illness.   Otherwise, review of systems are positive for none.   All other systems are reviewed and negative.    PHYSICAL EXAM: VS:  BP (!) 146/70   Pulse 96   Temp (!) 96.8 F (36 C)   Ht 6' (1.829 m)   Wt 185 lb (83.9 kg)   BMI 25.09 kg/m  , BMI Body mass index is 25.09 kg/m. GENERAL:  Well appearing HEENT:  Pupils equal round and reactive, fundi not visualized, oral mucosa unremarkable NECK:  No jugular venous distention, waveform within normal limits, carotid upstroke brisk and symmetric, no bruits LUNGS:  Clear to auscultation bilaterally HEART:  RRR.  PMI not displaced or sustained,S1 and S2 within normal limits, no S3, no S4, no clicks, no rubs, no murmurs ABD:  Flat, positive bowel sounds normal in frequency in pitch, no bruits, no rebound, no guarding, no midline pulsatile mass, no hepatomegaly, no splenomegaly EXT:  2 plus pulses throughout, no edema, no cyanosis no clubbing SKIN:  No rashes no nodules NEURO:  Cranial nerves II through XII grossly intact, motor grossly intact throughout PSYCH:  Cognitively intact, oriented to person place and time    EKG:  EKG is ordered today. Kimberly ekg ordered today demonstrates sinus rhythm.  Rate 96 bpm.  LAFB.  LVH with repolarization abnormalities   Recent Labs: 01/23/2019: Hemoglobin 13.5; Platelets 204.0 02/04/2019: ALT 17 10/11/2019: BUN 37; Creat 1.61; Potassium 3.8; Sodium 140    Lipid Panel    Component  Value Date/Time   CHOL 291 (H) 10/07/2019 1027   TRIG 263.0 (H) 10/07/2019 1027   HDL 37.20 (L) 10/07/2019 1027   CHOLHDL 8 10/07/2019 1027   VLDL 52.6 (H) 10/07/2019 1027   LDLCALC 103 05/18/2016 0000   LDLDIRECT 158.0 10/07/2019 1027      Wt Readings from Last 3 Encounters:  10/25/19 185 lb (83.9 kg)  10/11/19 187 lb (84.8 kg)  10/07/19 190 lb 6 oz (86.4 kg)      ASSESSMENT AND PLAN:  #Chest pain: Kimberly [name] symptoms are very atypical.  Kimberly [name] does not  really have much in Kimberly way of symptoms when Kimberly [name] goes for walks.  However Kimberly [name] does have risk factors including family history of CAD, hypertension, and hyperlipidemia.  Kimberly [name] also has diabetes.  We will refer her for an ETT.  Continue pravastatin.  #Hyperlipidemia: Kimberly [name] has not tolerated simvastatin in Kimberly past.  I suspect that her lipids will not be controlled on pravastatin with intermittent dosing.  Her goal should be less than 70 given her diabetes and high ASCVD risk.  We will check fasting lipids and a CMP prior to her next appointment.  #Essential hypertension: Blood pressure is poorly controlled.  Her blood pressure should be less than 130/80.  Kimberly [name] wants to work on diet and exercise prior to changing medications.  This is reasonable.  Continue hydrochlorothiazide and amlodipine.  # Murmur:  I don't hear a murmur today.  Likely a flow murmur and may be related to changes in hemodynamics.  Kimberly [name] is euvolemic.  Echo pending.   Current medicines are reviewed at length with Kimberly [name] today.  Kimberly [name] does not have concerns regarding medicines.  Kimberly following changes have been made:  no change  Labs/ tests ordered today include:   Orders Placed This Encounter  Procedures  . Lipid panel  . Comprehensive metabolic panel  . Exercise Tolerance Test  . EKG 12-Lead     Disposition:   FU with Sayge Salvato C. Oval Linsey, MD, [facility] in 3 months     Signed, Gerardo Caiazzo C. Oval Linsey, MD, [facility]  11/06/2019 9:46 AM    [place]

## 2019-10-28 ENCOUNTER — Telehealth: Payer: Self-pay | Admitting: Family Medicine

## 2019-10-28 ENCOUNTER — Ambulatory Visit: Payer: Medicare Other | Admitting: Family Medicine

## 2019-10-28 NOTE — Chronic Care Management (AMB) (Signed)
°  Chronic Care Management   Note  10/28/2019 Name: Robynn Marcel MRN: [id number] DOB: September 23, 1955  Celene Squibb Goodpasture is a 64 y.o. year old female who is a primary care patient of Martinique, Malka So, MD. I reached out to Charlann Noss by phone today in response to a referral sent by Ms. Ashby Dawes PCP, Martinique, Betty G, MD.   Ms. Godino was given information about Chronic Care Management services today including:  1. CCM service includes personalized support from designated clinical staff supervised by her physician, including individualized plan of care and coordination with other care providers 2. 24/7 contact phone numbers for assistance for urgent and routine care needs. 3. Service will only be billed when office clinical staff spend 20 minutes or more in a month to coordinate care. 4. Only one practitioner may furnish and bill the service in a calendar month. 5. The patient may stop CCM services at any time (effective at the end of the month) by phone call to the office staff.   Patient agreed to services and verbal consent obtained.   Follow up plan:   [place]

## 2019-10-29 ENCOUNTER — Other Ambulatory Visit (HOSPITAL_COMMUNITY): Payer: Medicare Other

## 2019-11-06 ENCOUNTER — Encounter: Payer: Self-pay | Admitting: Cardiovascular Disease

## 2019-11-08 ENCOUNTER — Other Ambulatory Visit: Payer: Self-pay | Admitting: Internal Medicine

## 2019-11-11 ENCOUNTER — Telehealth: Payer: Self-pay | Admitting: Family Medicine

## 2019-11-11 ENCOUNTER — Ambulatory Visit: Payer: Medicare Other | Admitting: Family Medicine

## 2019-11-11 ENCOUNTER — Other Ambulatory Visit (HOSPITAL_COMMUNITY)
Admission: RE | Admit: 2019-11-11 | Discharge: 2019-11-11 | Disposition: A | Discharge status: Home / Self Care | Payer: Medicare Other | Source: Ambulatory Visit | Attending: Cardiovascular Disease | Admitting: Cardiovascular Disease

## 2019-11-11 ENCOUNTER — Other Ambulatory Visit (HOSPITAL_COMMUNITY): Payer: Medicare Other

## 2019-11-11 DIAGNOSIS — Z20822 Contact with and (suspected) exposure to covid-19: Secondary | ICD-10-CM | POA: Diagnosis not present

## 2019-11-11 DIAGNOSIS — Z01812 Encounter for preprocedural laboratory examination: Secondary | ICD-10-CM | POA: Insufficient documentation

## 2019-11-11 LAB — SARS CORONAVIRUS 2 (TAT 6-24 HRS): SARS Coronavirus 2: NEGATIVE

## 2019-11-11 NOTE — Telephone Encounter (Signed)
Pt is calling in wanting to know why her Rx symbicort was denied.  Pharm:  [facility]

## 2019-11-11 NOTE — Telephone Encounter (Signed)
Looks like Rx was denied by Dr.Wert. Dr. Melvyn Novas put in last refill note to send Rx to [facility]. PT needs refill. Will send to Dr. Martinique for refill.

## 2019-11-12 ENCOUNTER — Telehealth (HOSPITAL_COMMUNITY): Payer: Self-pay

## 2019-11-12 NOTE — Telephone Encounter (Signed)
Encounter complete. 

## 2019-11-13 ENCOUNTER — Other Ambulatory Visit: Payer: Self-pay | Admitting: Family Medicine

## 2019-11-13 MED ORDER — BUDESONIDE-FORMOTEROL FUMARATE 160-4.5 MCG/ACT IN AERO: INHALATION_SPRAY | RESPIRATORY_TRACT | 1 refills | Status: DC

## 2019-11-13 NOTE — Telephone Encounter (Signed)
I think it was denied by Dr Melvyn Novas because last follow up was in 09/2018. I sent a Rx. Thanks, BJ

## 2019-11-14 ENCOUNTER — Other Ambulatory Visit: Payer: Self-pay

## 2019-11-14 ENCOUNTER — Ambulatory Visit (HOSPITAL_COMMUNITY)
Admission: RE | Admit: 2019-11-14 | Discharge: 2019-11-14 | Disposition: A | Discharge status: Home / Self Care | Payer: Medicare Other | Source: Ambulatory Visit | Attending: Cardiology | Admitting: Cardiology

## 2019-11-14 DIAGNOSIS — I10 Essential (primary) hypertension: Secondary | ICD-10-CM | POA: Diagnosis not present

## 2019-11-14 DIAGNOSIS — R079 Chest pain, unspecified: Secondary | ICD-10-CM

## 2019-11-14 LAB — EXERCISE TOLERANCE TEST
Estimated workload: 8.8 METS
Exercise duration (min): 7 min
Exercise duration (sec): 12 s
MPHR: 154 {beats}/min
Peak HR: 139 {beats}/min
Percent HR: 90 %
Rest HR: 93 {beats}/min

## 2019-11-19 ENCOUNTER — Telehealth: Payer: Self-pay | Admitting: *Deleted

## 2019-11-19 DIAGNOSIS — I10 Essential (primary) hypertension: Secondary | ICD-10-CM

## 2019-11-19 MED ORDER — AMLODIPINE BESYLATE 10 MG PO TABS: 10.0000 mg | ORAL_TABLET | Freq: Every day | ORAL | 3 refills | Status: DC

## 2019-11-19 NOTE — Telephone Encounter (Signed)
Advised patient, verbalized understanding  

## 2019-11-19 NOTE — Telephone Encounter (Signed)
-----   Message from Skeet Latch, MD sent at 11/15/2019 10:35 PM EDT ----- BP poorly controlled but negative stress test.  Increase amlodipine to 10mg .  Track BP and bring to scheduled follow up.

## 2020-01-29 ENCOUNTER — Other Ambulatory Visit: Payer: Self-pay

## 2020-01-29 ENCOUNTER — Encounter: Payer: Self-pay | Admitting: Cardiovascular Disease

## 2020-01-29 ENCOUNTER — Ambulatory Visit (INDEPENDENT_AMBULATORY_CARE_PROVIDER_SITE_OTHER): Payer: Medicare Other | Admitting: Cardiovascular Disease

## 2020-01-29 VITALS — BP 144/80 | HR 85 | Temp 95.9°F | Ht 71.0 in | Wt 188.4 lb

## 2020-01-29 DIAGNOSIS — I10 Essential (primary) hypertension: Secondary | ICD-10-CM | POA: Diagnosis not present

## 2020-01-29 DIAGNOSIS — N183 Chronic kidney disease, stage 3 unspecified: Secondary | ICD-10-CM

## 2020-01-29 DIAGNOSIS — Z5181 Encounter for therapeutic drug level monitoring: Secondary | ICD-10-CM | POA: Diagnosis not present

## 2020-01-29 NOTE — Patient Instructions (Addendum)
Medication Instructions:  Your physician recommends that you continue on your current medications as directed. Please refer to the Current Medication list given to you today.  *If you need a refill on your cardiac medications before your next appointment, please call your pharmacy*  Lab Work: BMET TODAY   If you have labs (blood work) drawn today and your tests are completely normal, you will receive your results only by: Marland Kitchen MyChart Message (if you have MyChart) OR . A paper copy in the mail If you have any lab test that is abnormal or we need to change your treatment, we will call you to review the results.  Testing/Procedures: NONE   Follow-Up: At [facility], you and your health needs are our priority.  As part of our continuing mission to provide you with exceptional heart care, we have created designated Provider Care Teams.  These Care Teams include your primary Cardiologist (physician) and Advanced Practice Providers (APPs -  Physician Assistants and Nurse Practitioners) who all work together to provide you with the care you need, when you need it.  We recommend signing up for the patient portal called "MyChart".  Sign up information is provided on this After Visit Summary.  MyChart is used to connect with patients for Virtual Visits (Telemedicine).  Patients are able to view lab/test results, encounter notes, upcoming appointments, etc.  Non-urgent messages can be sent to your provider as well.   To learn more about what you can do with MyChart, go to [email or web address].    Your next appointment:   3 month(s)  The format for your next appointment:   In Person  Provider:   You may see DR [facility]  or one of the following Advanced Practice Providers on your designated Care Team:    Kerin Ransom, PA-C  [place], [place]  Coletta Memos, FNP  Other Instructions  SOMEONE FROM THE PREP TEAM AT [place] BLOOD PRESSURE DAILY AND LOG IN  BOOK PROVIDED. BRING BOOK TO FOLLOW UP IN 3 MONTHS

## 2020-01-29 NOTE — Progress Notes (Signed)
Cardiology Office Note   Date:  01/29/2020   ID:  Manar, Smalling [birth year], MRN [id number]  PCP:  Martinique, Betty G, MD  Cardiologist:   Skeet Latch, MD   No chief complaint on file.     History of Present Illness: Dawanna Grauberger is a 64 y.o. female with hypertension, hyperlipidemia, GERD, CKD 3, diabetes, and cough-variant asthma here for follow up.  She was initially seen  09/2019 for the evaluation of chest pain shortness of breath.  Shesaw Dr. Martinique 09/2019 and was noted to have a murmur on exam.  She was referred to cardiology for further evaluation.  She reported some intermittent pain in her left arm that has been occurring for several weeks.  It happens at rest or when laying in the bed and last for a few seconds at a time.  Additionally, she notes some chest tightness that occur separately.  She thinks this is related to stress.  She was referred for an ETT 11/14/2019 that was negative for ischemia.  She achieved 8.8 metabolic equivalents.  Her blood pressure increased to 227/63.  She was started on amlodipine.  She checks her BP at home and it runs around 145.  She is tolerating amlodipine well but doesn't want to add any other medication. She hasn't had any swelling except under her feet. She has been getting some walking for exercise.  She does it 3-4 times per week.  She hasn't had any more chest pain. She struggles with knowing what to eat to be healthy.  She is trying to balance her diabetes and hypertension.  She has no LE edema, orthopnea or PND.   Past Medical History:  Diagnosis Date  . Cataract    OD  . Diabetes mellitus 2001  . Hyperlipidemia Dx 2012  . Hypertension 2001    Past Surgical History:  Procedure Laterality Date  . ABDOMINAL HYSTERECTOMY    . CATARACT EXTRACTION Left   . EYE SURGERY Left    Cat Sx     Current Outpatient Medications  Medication Sig Dispense Refill  . Accu-Chek FastClix Lancets MISC Use to test blood sugar 4 times  daily 102 each 3  . amLODipine (NORVASC) 10 MG tablet Take 1 tablet (10 mg total) by mouth daily. 90 tablet 3  . BD PEN NEEDLE NANO U/F 32G X 4 MM MISC USE AS DIRECTED ONCE DAILY 100 each 4  . benzonatate (TESSALON) 200 MG capsule TAKE ONE CAPSULE BY MOUTH THREE TIMES A DAY AS NEEDED FOR COUGH 30 capsule 3  . blood glucose meter kit and supplies KIT Use to check blood sugar 4 times a day 1 each 0  . Blood Glucose Monitoring Suppl (ACCU-CHEK AVIVA CONNECT) w/Device KIT 1 Device by Does not apply route daily. Use to test blood sugar 4 times daily 1 kit 0  . Blood Glucose Monitoring Suppl (ACCU-CHEK AVIVA PLUS) w/Device KIT Use to test blood sugar 4 times daily 1 kit 0  . budesonide-formoterol (SYMBICORT) 160-4.5 MCG/ACT inhaler INHALE TWO PUFFS BY MOUTH TWO TIMES A DAY 10.2 g 1  . Continuous Blood Gluc Receiver (FREESTYLE LIBRE READER) DEVI 1 Device by Does not apply route as directed. 6 each 3  . Continuous Blood Gluc Sensor (FREESTYLE LIBRE SENSOR SYSTEM) MISC 1 Device by Does not apply route 3 (three) times daily as needed. 6 each 2  . glucose blood (ACCU-CHEK AVIVA PLUS) test strip Use to test blood sugar 4 times daily 100 each 3  .  hydrochlorothiazide (HYDRODIURIL) 25 MG tablet TAKE ONE TABLET BY MOUTH DAILY 90 tablet 2  . insulin glargine (LANTUS SOLOSTAR) 100 UNIT/ML Solostar Pen INJECT 60 UNITS INTO THE SKIN EVERY NIGHT AT BEDTIME 15 mL 3  . NON FORMULARY [facility] Peripheral Neuropathy Cream: Bupivacaine 1%; Doxepin 3%; Gabapentin 6%; Pentoxifylline 3%; Topiramate 1%  Faxed over to [facility] - 05/02/19 with 5 refills    . pravastatin (PRAVACHOL) 20 MG tablet 1 tab 3 times per week. 13 tablet 3   No current facility-administered medications for this visit.    Allergies:   Ace inhibitors    Social History:  The patient  reports that she has never smoked. She has never used smokeless tobacco. She reports current alcohol use. She reports that she does not use drugs.    Family History:  The patient's family history includes Depression in her mother; Diabetes in her mother; Heart attack (age of onset: 51) in her father; Heart disease in her father; Hypertension in her mother; Lung cancer in her brother; Uterine cancer in her sister.    ROS:  Please see the history of present illness.   Otherwise, review of systems are positive for none.   All other systems are reviewed and negative.    PHYSICAL EXAM: VS:  BP (!) 144/80   Pulse 85   Temp (!) 95.9 F (35.5 C)   Ht '5\' 11"'  (1.803 m)   Wt 188 lb 6.4 oz (85.5 kg)   SpO2 98%   BMI 26.28 kg/m  , BMI Body mass index is 26.28 kg/m. GENERAL:  Well appearing HEENT: Pupils equal round and reactive, fundi not visualized, oral mucosa unremarkable NECK:  No jugular venous distention, waveform within normal limits, carotid upstroke brisk and symmetric, no bruits LUNGS:  Clear to auscultation bilaterally HEART:  RRR.  PMI not displaced or sustained,S1 and S2 within normal limits, no S3, no S4, no clicks, no rubs, no murmurs ABD:  Flat, positive bowel sounds normal in frequency in pitch, no bruits, no rebound, no guarding, no midline pulsatile mass, no hepatomegaly, no splenomegaly EXT:  2 plus pulses throughout, no edema, no cyanosis no clubbing SKIN:  No rashes no nodules NEURO:  Cranial nerves II through XII grossly intact, motor grossly intact throughout PSYCH:  Cognitively intact, oriented to person place and time   EKG:  EKG is not ordered today. The ekg ordered today demonstrates sinus rhythm.  Rate 96 bpm.  LAFB.  LVH with repolarization abnormalities  ETT 11/14/19:  There was no ST segment deviation noted during stress.  No evidence of ischemia  Hypertensive response to exercise, peak BP 227/63     Recent Labs: 02/04/2019: ALT 17 10/11/2019: BUN 37; Creat 1.61; Potassium 3.8; Sodium 140    Lipid Panel    Component Value Date/Time   CHOL 291 (H) 10/07/2019 1027   TRIG 263.0 (H) 10/07/2019  1027   HDL 37.20 (L) 10/07/2019 1027   CHOLHDL 8 10/07/2019 1027   VLDL 52.6 (H) 10/07/2019 1027   LDLCALC 103 05/18/2016 0000   LDLDIRECT 158.0 10/07/2019 1027      Wt Readings from Last 3 Encounters:  01/29/20 188 lb 6.4 oz (85.5 kg)  10/25/19 185 lb (83.9 kg)  10/11/19 187 lb (84.8 kg)      ASSESSMENT AND PLAN:  # Chest pain: Resolved.  ETT negative for ischemia.   # Hyperlipidemia: She has not tolerated simvastatin in the past.  Check lipids/CMP at follow up.  Continue pravastatin for now.    #  Essential hypertension: Her blood pressure is better but not at goal.  She is reluctant to start any more medications.  For now we will continue her current regimen.  She is going to get very serious about diet and exercise.  Will reevaluate in 3 months.  For now, continue amlodipine and hydrochlorothiazide.  We will also refer her to the PrEP program  # CKD:  Repeat BMP.  # Murmur:  I don't hear a murmur today.  She didn't show up for prior echo.  Current medicines are reviewed at length with the patient today.  The patient does not have concerns regarding medicines.  The following changes have been made:  no change  Labs/ tests ordered today include:   Orders Placed This Encounter  Procedures  . Basic metabolic panel     Disposition:   FU with Malic Rosten C. Oval Linsey, MD, [facility] in 3 months     Signed, Deland Slocumb C. Oval Linsey, MD, [facility]  01/29/2020 6:44 PM    [place]

## 2020-01-30 LAB — BASIC METABOLIC PANEL
BUN/Creatinine Ratio: 22 (ref 12–28)
BUN: 37 mg/dL — ABNORMAL HIGH (ref 8–27)
CO2: 21 mmol/L (ref 20–29)
Calcium: 9.7 mg/dL (ref 8.7–10.3)
Chloride: 96 mmol/L (ref 96–106)
Creatinine, Ser: 1.65 mg/dL — ABNORMAL HIGH (ref 0.57–1.00)
GFR calc Af Amer: 37 mL/min/{1.73_m2} — ABNORMAL LOW (ref >59)
GFR calc non Af Amer: 32 mL/min/{1.73_m2} — ABNORMAL LOW (ref >59)
Glucose: 388 mg/dL — ABNORMAL HIGH (ref 65–99)
Potassium: 4.1 mmol/L (ref 3.5–5.2)
Sodium: 136 mmol/L (ref 134–144)

## 2020-02-04 ENCOUNTER — Telehealth: Payer: Self-pay | Admitting: *Deleted

## 2020-02-04 ENCOUNTER — Telehealth: Payer: Self-pay

## 2020-02-04 NOTE — Telephone Encounter (Signed)
Call placed reference referral to PREP from Dr Oval Linsey.  Explained program. Interested. Would like [place] location. Will call back with date and time of start of next class which likely will be early Sept.  Given contact info.

## 2020-02-04 NOTE — Telephone Encounter (Signed)
Mychart message received asking to call and discuss labs, reviewed with patient   Skeet Latch, MD  02/02/2020 7:20 PM EDT     Glucose is very high. Kidney function remains elevated but is stable. Continue current medications. We may need to think about trying something other than HCTZ if it continues to be elevated at follow up.

## 2020-02-10 ENCOUNTER — Other Ambulatory Visit: Payer: Self-pay | Admitting: Family Medicine

## 2020-02-16 NOTE — Progress Notes (Signed)
HPI: Kimberly [name] is a 64 y.o. female, who is here today for AWV and follow up.   She was last seen on 10/11/19. Since her last visit she has seen cardiologist.  She lives alone. Independent ADL's and IADL's.  Functional Status Survey: Is the patient deaf or have difficulty hearing?: No Does the patient have difficulty seeing, even when wearing glasses/contacts?: No Does the patient have difficulty concentrating, remembering, or making decisions?: No Does the patient have difficulty walking or climbing stairs?: No Does the patient have difficulty dressing or bathing?: No Does the patient have difficulty doing errands alone such as visiting a doctor's office or shopping?: No  Fall Risk  02/17/2020 02/04/2019 07/09/2018 01/06/2015 10/17/2014  Falls in the past year? 0 0 0 No No  Number falls in past yr: - 0 - - -  Injury with Fall? - 0 - - -  Follow up - Education provided - - -   Providers she sees regularly: Eye care provider: DR Coralyn Pear.Last seen in 03/2019 Cardiology: Dr Oval Linsey, last seen on 01/29/20. Podiatrist: DR Paulla Dolly.  Depression screen [facility] 2/9 02/17/2020  Decreased Interest 0  Down, Depressed, Hopeless 0  PHQ - 2 Score 0  Altered sleeping -  Tired, decreased energy -  Change in appetite -  Feeling bad or failure about yourself  -  Trouble concentrating -  Moving slowly or fidgety/restless -  Suicidal thoughts -  PHQ-9 Score -     Mini-Cog - 02/17/20 0921    Normal clock drawing test? yes           Hearing Screening   '125Hz'  '250Hz'  '500Hz'  '1000Hz'  '2000Hz'  '3000Hz'  '4000Hz'  '6000Hz'  '8000Hz'   Right ear:           Left ear:             Visual Acuity Screening   Right eye Left eye Both eyes  Without correction: '20/25 20/25 20/20 '  With correction:      HTN: Amlodipine dose was increased from 5 mg to 10 mg because elevated BP during stress test. HCTZ 25 mg daily. Negative for  severe/frequent headache, visual changes, chest pain, dyspnea, palpitation,  claudication, focal weakness, or edema.  Lab Results  Component Value Date   CREATININE 1.65 (H) 01/29/2020   BUN 37 (H) 01/29/2020   NA 136 01/29/2020   K 4.1 01/29/2020   CL 96 01/29/2020   CO2 21 13/01/6577   DM2: Complicated with peripheral neuropathy and moderate non proliferative diabetic retinopathy. Not checking BS's. She did not tolerate Empagliflozin-Metformin 10-998 mg well, caused diarrhea. So she went back to Janumet XR 50-1000 mg. She is also on Lantus 60 to 62 units daily. Negative for hypoglycemic events. Negative for polydipsia,polyuria, or polyphagia.  Lab Results  Component Value Date   HGBA1C 8.8 (H) 10/07/2019   Hyperlipidemia: Currently she is on pravastatin 20 mg 3 times per week.  She has not tolerated stronger statin medications and if she takes pravastatin daily it causes myalgias.  Lab Results  Component Value Date   CHOL 291 (H) 10/07/2019   HDL 37.20 (L) 10/07/2019   LDLCALC 103 05/18/2016   LDLDIRECT 158.0 10/07/2019   TRIG 263.0 (H) 10/07/2019   CHOLHDL 8 10/07/2019    Review of Systems  Constitutional: Negative for activity change, appetite change and fever.  HENT: Negative for mouth sores, nosebleeds and sore throat.   Eyes: Negative for redness and visual disturbance.  Respiratory: Negative for cough and wheezing.  Gastrointestinal: Negative for abdominal pain, nausea and vomiting.       Negative for changes in bowel habits.  Genitourinary: Negative for decreased urine volume, dysuria and hematuria.  Musculoskeletal: Positive for arthralgias.  Allergic/Immunologic: Positive for environmental allergies.  Neurological: Negative for syncope, facial asymmetry and weakness.  Rest of ROS, see pertinent positives sand negatives in HPI  Current Outpatient Medications on File Prior to Visit  Medication Sig Dispense Refill  . Accu-Chek FastClix Lancets MISC Use to test blood sugar 4 times daily 102 each 3  . amLODipine (NORVASC) 10 MG tablet  Take 1 tablet (10 mg total) by mouth daily. 90 tablet 3  . BD PEN NEEDLE NANO U/F 32G X 4 MM MISC USE AS DIRECTED ONCE DAILY 100 each 4  . benzonatate (TESSALON) 200 MG capsule TAKE ONE CAPSULE BY MOUTH THREE TIMES A DAY AS NEEDED FOR COUGH 30 capsule 3  . blood glucose meter kit and supplies KIT Use to check blood sugar 4 times a day 1 each 0  . Blood Glucose Monitoring Suppl (ACCU-CHEK AVIVA CONNECT) w/Device KIT 1 Device by Does not apply route daily. Use to test blood sugar 4 times daily 1 kit 0  . Blood Glucose Monitoring Suppl (ACCU-CHEK AVIVA PLUS) w/Device KIT Use to test blood sugar 4 times daily 1 kit 0  . budesonide-formoterol (SYMBICORT) 160-4.5 MCG/ACT inhaler INHALE TWO PUFFS BY MOUTH TWO TIMES A DAY 10.2 g 1  . Continuous Blood Gluc Receiver (FREESTYLE LIBRE READER) DEVI 1 Device by Does not apply route as directed. 6 each 3  . Continuous Blood Gluc Sensor (FREESTYLE LIBRE SENSOR SYSTEM) MISC 1 Device by Does not apply route 3 (three) times daily as needed. 6 each 2  . glucose blood (ACCU-CHEK AVIVA PLUS) test strip Use to test blood sugar 4 times daily 100 each 3  . hydrochlorothiazide (HYDRODIURIL) 25 MG tablet TAKE ONE TABLET BY MOUTH DAILY 90 tablet 2  . JANUMET XR 50-1000 MG TB24 Take 1 tablet by mouth at bedtime.    . NON FORMULARY [facility] Peripheral Neuropathy Cream: Bupivacaine 1%; Doxepin 3%; Gabapentin 6%; Pentoxifylline 3%; Topiramate 1%  Faxed over to [place] - 05/02/19 with 5 refills    . pravastatin (PRAVACHOL) 20 MG tablet TAKE 1 TABLET BY MOUTH THREE TIMES PER WEEK 13 tablet 3   No current facility-administered medications on file prior to visit.    Past Medical History:  Diagnosis Date  . Cataract    OD  . Diabetes mellitus 2001  . Hyperlipidemia Dx 2012  . Hypertension 2001   Allergies  Allergen Reactions  . Ace Inhibitors Cough    Social History   Socioeconomic History  . Marital status: Divorced    Spouse name: Not on  file  . Number of children: Not on file  . Years of education: Not on file  . Highest education level: Not on file  Occupational History  . Not on file  Tobacco Use  . Smoking status: Never Smoker  . Smokeless tobacco: Never Used  Vaping Use  . Vaping Use: Never used  Substance and Sexual Activity  . Alcohol use: Yes    Comment: occ  . Drug use: No  . Sexual activity: Not on file  Other Topics Concern  . Not on file  Social History Narrative  . Not on file   Social Determinants of Health   Financial Resource Strain:   . Difficulty of Paying Living Expenses: Not on file  Food Insecurity:   .  Worried About Charity fundraiser in the Last Year: Not on file  . Ran Out of Food in the Last Year: Not on file  Transportation Needs:   . Lack of Transportation (Medical): Not on file  . Lack of Transportation (Non-Medical): Not on file  Physical Activity:   . Days of Exercise per Week: Not on file  . Minutes of Exercise per Session: Not on file  Stress:   . Feeling of Stress : Not on file  Social Connections:   . Frequency of Communication with Friends and Family: Not on file  . Frequency of Social Gatherings with Friends and Family: Not on file  . Attends Religious Services: Not on file  . Active Member of Clubs or Organizations: Not on file  . Attends Archivist Meetings: Not on file  . Marital Status: Not on file   Vitals:   02/17/20 0902  BP: 118/70  Pulse: 80  Resp: 16  SpO2: 96%   Body mass index is 25.94 kg/m.  Physical Exam Vitals and nursing note reviewed.  Constitutional:      General: She is not in acute distress.    Appearance: She is well-developed.  HENT:     Head: Normocephalic and atraumatic.     Mouth/Throat:     Mouth: Mucous membranes are moist.     Pharynx: Oropharynx is clear.  Eyes:     Conjunctiva/sclera: Conjunctivae normal.     Pupils: Pupils are equal, round, and reactive to light.  Cardiovascular:     Rate and Rhythm:  Normal rate and regular rhythm.     Pulses:          Dorsalis pedis pulses are 2+ on the right side and 2+ on the left side.     Heart sounds: No murmur heard.   Pulmonary:     Effort: Pulmonary effort is normal. No respiratory distress.     Breath sounds: Normal breath sounds.  Abdominal:     Palpations: Abdomen is soft. There is no hepatomegaly or mass.     Tenderness: There is no abdominal tenderness.  Lymphadenopathy:     Cervical: No cervical adenopathy.  Skin:    General: Skin is warm.     Findings: No erythema or rash.  Neurological:     Mental Status: She is alert and oriented to person, place, and time.     Cranial Nerves: No cranial nerve deficit.     Gait: Gait normal.  Psychiatric:     Comments: Well groomed, good eye contact.    ASSESSMENT AND PLAN:  Kimberly [name] was seen today for AWV and 4 months follow-up.  Orders Placed This Encounter  Procedures  . Lipid panel  . POC HgB A1c   Lab Results  Component Value Date   HGBA1C 8.3 (A) 02/17/2020   Lab Results  Component Value Date   CHOL 220 (H) 02/17/2020   HDL 44 (L) 02/17/2020   LDLCALC 145 (H) 02/17/2020   LDLDIRECT 158.0 10/07/2019   TRIG 169 (H) 02/17/2020   CHOLHDL 5.0 (H) 02/17/2020    Medicare annual wellness visit, initial We discussed the importance of staying active, physically and mentally, as well as the benefits of a healthy/balance diet. Low impact exercise that involve stretching and strengthing are ideal. Vaccines up to date, due for pneumovax 04/2020. We discussed preventive screening for the next 5-10 years, summery of recommendations given in AVS. DEXA will be arranged. Fall prevention.  Advance directives and  end of life discussed, she does not have POA or living will. Advance healthcare directive forms given.   Poorly controlled type 2 diabetes mellitus with peripheral neuropathy ([place]) HgA1C is improved but still far from goal. She is reluctant to increase Lantus  dose, she finally agrees with increase dose to 65 U. No changes in Janumet. We discussed possible complications of poorly controlled glucose, disease is affecting eyes,kidney,and peripheral nerves. Regular exercise and healthy diet with avoidance of added sugar food intake is an important part of treatment and recommended. Annual eye exam, periodic dental and foot care recommended. F/U in 3-4 months  -     insulin glargine (LANTUS SOLOSTAR) 100 UNIT/ML Solostar Pen; INJECT 65 UNITS INTO THE SKIN EVERY NIGHT AT BEDTIME  Essential hypertension BP better controlled. No changes in current management. Continue low salt diet.  Stage 3 chronic kidney disease, unspecified whether stage 3a or 3b CKD To prevent progression, BP and glucose should be well controlled. Avoid NSAID's and continue low salt diet. For now no changes in hypoglycemic agents but if worsening numbers, we will need to discontinue.  Hyperlipidemia, unspecified hyperlipidemia type Poor tolerance to statins, so far she has tolerated Pravastatin 3 times per week. Further recommendations according to FLP result.   Return in about 14 weeks (around 05/25/2020) for DM II,CKD,and pneumonia shot..   Arben Packman G. Martinique, MD  [facility]. [place] office.  Ms. Tamargo , Thank you for taking time to come for your Medicare Wellness Visit. I appreciate your ongoing commitment to your health goals. Please review the following plan we discussed and let me know if I can assist you in the future.   These are the goals we discussed: Goals    . Blood Pressure < 140/90    . HEMOGLOBIN A1C < 8       This is a list of the screening recommended for you and due dates:  Health Maintenance  Topic Date Due  . DEXA scan (bone density measurement)  Never done  . Complete foot exam   02/04/2020  . Pneumonia vaccines (2 of 2 - PPSV23) 02/04/2020  . Flu Shot  05/19/2020*  . Eye exam for diabetics  04/22/2020  . Hemoglobin A1C   08/19/2020  . Mammogram  03/12/2021  . Tetanus Vaccine  04/30/2024  . Colon Cancer Screening  07/18/2026  . COVID-19 Vaccine  Completed  .  Hepatitis C: One time screening is recommended by Center for Disease Control  (CDC) for  adults born from [birth year] through 1967.   Completed  *Topic was postponed. The date shown is not the original due date.   A few things to remember from today's visit:   Essential hypertension  Stage 3 chronic kidney disease, unspecified whether stage 3a or 3b CKD  Poorly controlled type 2 diabetes mellitus with peripheral neuropathy ([place]) - Plan: POC HgB A1c  Hyperlipidemia, unspecified hyperlipidemia type - Plan: Lipid panel  If you need refills please call your pharmacy. Do not use My Chart to request refills or for acute issues that need immediate attention.    Please be sure medication list is accurate. If a new problem present, please set up appointment sooner than planned today.   A few tips:  -As we age balance is not as good as it was, so there is a higher risks for falls. Please remove small rugs and furniture that is "in your way" and could increase the risk of falls. Stretching exercises may help with fall prevention:  Yoga and Tai Chi are some examples. Low impact exercise is better, so you are not very achy the next day.  -Sun screen and avoidance of direct sun light recommended. Caution with dehydration, if working outdoors be sure to drink enough fluids.  - Some medications are not safe as we age, increases the risk of side effects and can potentially interact with other medication you are also taken;  including some of over the counter medications. Be sure to let me know when you start a new medication even if it is a dietary/vitamin supplement.   -Healthy diet low in red meet/animal fat and sugar + regular physical activity is recommended.

## 2020-02-17 ENCOUNTER — Other Ambulatory Visit: Payer: Self-pay

## 2020-02-17 ENCOUNTER — Encounter: Payer: Self-pay | Admitting: Family Medicine

## 2020-02-17 ENCOUNTER — Ambulatory Visit (INDEPENDENT_AMBULATORY_CARE_PROVIDER_SITE_OTHER): Payer: Medicare Other | Admitting: Family Medicine

## 2020-02-17 VITALS — BP 118/70 | HR 80 | Resp 16 | Ht 71.0 in | Wt 186.0 lb

## 2020-02-17 DIAGNOSIS — E785 Hyperlipidemia, unspecified: Secondary | ICD-10-CM | POA: Diagnosis not present

## 2020-02-17 DIAGNOSIS — I10 Essential (primary) hypertension: Secondary | ICD-10-CM

## 2020-02-17 DIAGNOSIS — E1142 Type 2 diabetes mellitus with diabetic polyneuropathy: Secondary | ICD-10-CM

## 2020-02-17 DIAGNOSIS — Z0000 Encounter for general adult medical examination without abnormal findings: Secondary | ICD-10-CM | POA: Diagnosis not present

## 2020-02-17 DIAGNOSIS — N183 Chronic kidney disease, stage 3 unspecified: Secondary | ICD-10-CM | POA: Diagnosis not present

## 2020-02-17 DIAGNOSIS — E1165 Type 2 diabetes mellitus with hyperglycemia: Secondary | ICD-10-CM

## 2020-02-17 LAB — POCT GLYCOSYLATED HEMOGLOBIN (HGB A1C): Hemoglobin A1C: 8.3 % — AB (ref 4.0–5.6)

## 2020-02-17 MED ORDER — LANTUS SOLOSTAR 100 UNIT/ML ~~LOC~~ SOPN: PEN_INJECTOR | SUBCUTANEOUS | 3 refills | Status: DC

## 2020-02-17 NOTE — Patient Instructions (Addendum)
  Ms. Storr , Thank you for taking time to come for your Medicare Wellness Visit. I appreciate your ongoing commitment to your health goals. Please review the following plan we discussed and let me know if I can assist you in the future.   These are the goals we discussed: Goals    . Blood Pressure < 140/90    . HEMOGLOBIN A1C < 8       This is a list of the screening recommended for you and due dates:  Health Maintenance  Topic Date Due  . DEXA scan (bone density measurement)  Never done  . Complete foot exam   02/04/2020  . Pneumonia vaccines (2 of 2 - PPSV23) 02/04/2020  . Flu Shot  05/19/2020*  . Eye exam for diabetics  04/22/2020  . Hemoglobin A1C  08/19/2020  . Mammogram  03/12/2021  . Tetanus Vaccine  04/30/2024  . Colon Cancer Screening  07/18/2026  . COVID-19 Vaccine  Completed  .  Hepatitis C: One time screening is recommended by Center for Disease Control  (CDC) for  adults born from [birth year] through 1967.   Completed  *Topic was postponed. The date shown is not the original due date.   A few things to remember from today's visit:   Essential hypertension  Stage 3 chronic kidney disease, unspecified whether stage 3a or 3b CKD  Poorly controlled type 2 diabetes mellitus with peripheral neuropathy ([place]) - Plan: POC HgB A1c  Hyperlipidemia, unspecified hyperlipidemia type - Plan: Lipid panel  If you need refills please call your pharmacy. Do not use My Chart to request refills or for acute issues that need immediate attention.    Please be sure medication list is accurate. If a new problem present, please set up appointment sooner than planned today.   A few tips:  -As we age balance is not as good as it was, so there is a higher risks for falls. Please remove small rugs and furniture that is "in your way" and could increase the risk of falls. Stretching exercises may help with fall prevention: Yoga and Tai Chi are some examples. Low impact exercise is better, so  you are not very achy the next day.  -Sun screen and avoidance of direct sun light recommended. Caution with dehydration, if working outdoors be sure to drink enough fluids.  - Some medications are not safe as we age, increases the risk of side effects and can potentially interact with other medication you are also taken;  including some of over the counter medications. Be sure to let me know when you start a new medication even if it is a dietary/vitamin supplement.   -Healthy diet low in red meet/animal fat and sugar + regular physical activity is recommended.

## 2020-02-18 LAB — LIPID PANEL
Cholesterol: 220 mg/dL — ABNORMAL HIGH (ref <200)
HDL: 44 mg/dL — ABNORMAL LOW (ref >=50)
LDL Cholesterol (Calc): 145 mg/dL (calc) — ABNORMAL HIGH
Non-HDL Cholesterol (Calc): 176 mg/dL (calc) — ABNORMAL HIGH (ref <130)
Total CHOL/HDL Ratio: 5 (calc) — ABNORMAL HIGH (ref <5.0)
Triglycerides: 169 mg/dL — ABNORMAL HIGH (ref <150)

## 2020-02-20 ENCOUNTER — Encounter: Payer: Self-pay | Admitting: Family Medicine

## 2020-02-20 MED ORDER — JANUMET XR 50-1000 MG PO TB24: 1.0000 | ORAL_TABLET | Freq: Every day | ORAL | 1 refills | Status: DC

## 2020-02-21 ENCOUNTER — Encounter: Payer: Self-pay | Admitting: Family Medicine

## 2020-02-21 ENCOUNTER — Telehealth: Payer: Self-pay

## 2020-02-21 NOTE — Telephone Encounter (Signed)
Call placed to patient and confirmed she can start PREP on 9/14 at the [facility]. Class times will be 1pm -215pm.  Will call back to schedule intake appt closer to class time.

## 2020-03-04 ENCOUNTER — Telehealth: Payer: Self-pay

## 2020-03-04 NOTE — Telephone Encounter (Signed)
Received call from pt--returned call.  Was to start PREP on 9/14 class was due for intake appt however has changed mind about in person exercise given rising Covid numbers.  Offered virtual option as an interim exercise plan until things settle.  She is agreeable. Sent invitation for class M/W 10am for chair based exercise to [email or web address] Will f/u on Monday to assist with log on

## 2020-03-10 ENCOUNTER — Other Ambulatory Visit: Payer: Self-pay

## 2020-03-10 ENCOUNTER — Other Ambulatory Visit: Payer: Medicare Other

## 2020-03-10 DIAGNOSIS — Z20822 Contact with and (suspected) exposure to covid-19: Secondary | ICD-10-CM

## 2020-03-11 ENCOUNTER — Other Ambulatory Visit: Payer: Self-pay | Admitting: Family Medicine

## 2020-03-11 DIAGNOSIS — Z1231 Encounter for screening mammogram for malignant neoplasm of breast: Secondary | ICD-10-CM

## 2020-03-12 LAB — SARS-COV-2, NAA 2 DAY TAT

## 2020-03-12 LAB — NOVEL CORONAVIRUS, NAA: SARS-CoV-2, NAA: NOT DETECTED

## 2020-03-18 ENCOUNTER — Encounter: Payer: Self-pay | Admitting: Family Medicine

## 2020-03-24 ENCOUNTER — Ambulatory Visit
Admission: RE | Admit: 2020-03-24 | Discharge: 2020-03-24 | Disposition: A | Discharge status: Home / Self Care | Payer: Medicare Other | Source: Ambulatory Visit | Attending: Family Medicine | Admitting: Family Medicine

## 2020-03-24 ENCOUNTER — Other Ambulatory Visit: Payer: Self-pay

## 2020-03-24 DIAGNOSIS — Z1231 Encounter for screening mammogram for malignant neoplasm of breast: Secondary | ICD-10-CM | POA: Diagnosis not present

## 2020-03-25 ENCOUNTER — Other Ambulatory Visit: Payer: Self-pay | Admitting: Family Medicine

## 2020-03-25 MED ORDER — PRAVASTATIN SODIUM 20 MG PO TABS: ORAL_TABLET | ORAL | 1 refills | Status: DC

## 2020-04-14 ENCOUNTER — Ambulatory Visit: Payer: Medicare Other

## 2020-04-17 ENCOUNTER — Other Ambulatory Visit: Payer: Self-pay | Admitting: Family Medicine

## 2020-04-17 DIAGNOSIS — I10 Essential (primary) hypertension: Secondary | ICD-10-CM

## 2020-04-23 DIAGNOSIS — E113293 Type 2 diabetes mellitus with mild nonproliferative diabetic retinopathy without macular edema, bilateral: Secondary | ICD-10-CM | POA: Diagnosis not present

## 2020-04-23 DIAGNOSIS — H52223 Regular astigmatism, bilateral: Secondary | ICD-10-CM | POA: Diagnosis not present

## 2020-04-23 DIAGNOSIS — H2511 Age-related nuclear cataract, right eye: Secondary | ICD-10-CM | POA: Diagnosis not present

## 2020-04-23 DIAGNOSIS — H5213 Myopia, bilateral: Secondary | ICD-10-CM | POA: Diagnosis not present

## 2020-04-24 ENCOUNTER — Other Ambulatory Visit: Payer: Self-pay

## 2020-04-24 ENCOUNTER — Telehealth: Payer: Self-pay | Admitting: Family Medicine

## 2020-04-24 DIAGNOSIS — Z13820 Encounter for screening for osteoporosis: Secondary | ICD-10-CM

## 2020-04-24 NOTE — Telephone Encounter (Signed)
Appt can be scheduled on or after 11/4 for her pneumonia vaccine.

## 2020-04-24 NOTE — Telephone Encounter (Signed)
She can get Pneumovax on 04/30/2020 (last one 04/30/2014). Thanks, BJ

## 2020-04-24 NOTE — Telephone Encounter (Signed)
Patient scheduled for 11/18 at 3 PM

## 2020-04-24 NOTE — Telephone Encounter (Signed)
Patient is wanting to know when she can schedule her phnemonia shot.  Please advise

## 2020-04-24 NOTE — Telephone Encounter (Signed)
Okay to schedule? Looks like she's due for the pneumovax.

## 2020-04-28 ENCOUNTER — Other Ambulatory Visit: Payer: Self-pay

## 2020-04-28 ENCOUNTER — Ambulatory Visit (INDEPENDENT_AMBULATORY_CARE_PROVIDER_SITE_OTHER)
Admission: RE | Admit: 2020-04-28 | Discharge: 2020-04-28 | Disposition: A | Discharge status: Home / Self Care | Payer: Medicare Other | Source: Ambulatory Visit | Attending: Family Medicine | Admitting: Family Medicine

## 2020-04-28 DIAGNOSIS — Z13820 Encounter for screening for osteoporosis: Secondary | ICD-10-CM | POA: Diagnosis not present

## 2020-05-01 ENCOUNTER — Encounter: Payer: Self-pay | Admitting: Cardiovascular Disease

## 2020-05-01 ENCOUNTER — Other Ambulatory Visit: Payer: Self-pay

## 2020-05-01 ENCOUNTER — Ambulatory Visit: Payer: Medicare Other | Admitting: Cardiovascular Disease

## 2020-05-01 VITALS — BP 138/64 | HR 80 | Ht 71.0 in | Wt 190.0 lb

## 2020-05-01 DIAGNOSIS — M791 Myalgia, unspecified site: Secondary | ICD-10-CM

## 2020-05-01 DIAGNOSIS — E785 Hyperlipidemia, unspecified: Secondary | ICD-10-CM | POA: Diagnosis not present

## 2020-05-01 DIAGNOSIS — T466X5A Adverse effect of antihyperlipidemic and antiarteriosclerotic drugs, initial encounter: Secondary | ICD-10-CM

## 2020-05-01 DIAGNOSIS — N183 Chronic kidney disease, stage 3 unspecified: Secondary | ICD-10-CM | POA: Diagnosis not present

## 2020-05-01 DIAGNOSIS — I10 Essential (primary) hypertension: Secondary | ICD-10-CM

## 2020-05-01 NOTE — Progress Notes (Signed)
Cardiology Office Note   Date:  05/01/2020   ID:  Kimberly [name], Kimberly [name] July 07, 1955, MRN [id number]  PCP:  Kimberly [name], Betty G, MD  Cardiologist:   Skeet Latch, MD   Chief Complaint  [name]   Follow-up    3 months.     History of Present Illness: Kimberly [name] is a 64 y.o. female with hypertension, hyperlipidemia, GERD, CKD 3, diabetes, and cough-variant asthma here for follow up.  She was initially seen  09/2019 for Kimberly evaluation of chest pain shortness of breath.  She saw Dr. Martinique 09/2019 and was noted to have a murmur on exam.  She was referred to cardiology for further evaluation.  She reported some intermittent pain in her left arm that has been occurring for several weeks.  It happens at rest or when laying in Kimberly bed and last for a few seconds at a time.  Additionally, she notes some chest tightness that occur separately.  She thinks this is related to stress.  She was referred for an ETT 11/14/2019 that was negative for ischemia.  She achieved 8.8 metabolic equivalents.  Her blood pressure increased to 227/63.  She was started on amlodipine.  She checks her BP at home and it runs around 145.  She is tolerating amlodipine well but doesn't want to add any other medication.  Lately she has been feeling well.  She hasn't been checking her BP much lately.  She has been walking and using exercise tapes at home.  She feels good with exercise.  She has no exertional chest pain or shortness of breath.  She increased her pravastatin to for 5 days/week, though she does sometimes have to skip it because it causes muscle aches.  She has occasional swelling in her right knee that is due to arthritis.  She denies orthopnea or PND.  She has no edema in her feet or legs.  Overall she is doing well and try to work on her diet.   Past Medical History:  Diagnosis Date   Cataract    OD   Diabetes mellitus 2001   Hyperlipidemia Dx 2012   Hypertension 2001    Past Surgical  History:  Procedure Laterality Date   ABDOMINAL HYSTERECTOMY     CATARACT EXTRACTION Left    EYE SURGERY Left    Cat Sx     Current Outpatient Medications  Medication Sig Dispense Refill   Accu-Chek FastClix Lancets MISC Use to test blood sugar 4 times daily 102 each 3   amLODipine (NORVASC) 10 MG tablet Take 1 tablet (10 mg total) by mouth daily. 90 tablet 3   BD PEN NEEDLE NANO U/F 32G X 4 MM MISC USE AS DIRECTED ONCE DAILY 100 each 4   benzonatate (TESSALON) 200 MG capsule TAKE ONE CAPSULE BY MOUTH THREE TIMES A DAY AS NEEDED FOR COUGH 30 capsule 3   blood glucose meter kit and supplies KIT Use to check blood sugar 4 times a day 1 each 0   Blood Glucose Monitoring Suppl (ACCU-CHEK AVIVA CONNECT) w/Device KIT 1 Device by Does not apply route daily. Use to test blood sugar 4 times daily 1 kit 0   Blood Glucose Monitoring Suppl (ACCU-CHEK AVIVA PLUS) w/Device KIT Use to test blood sugar 4 times daily 1 kit 0   budesonide-formoterol (SYMBICORT) 160-4.5 MCG/ACT inhaler INHALE TWO PUFFS BY MOUTH TWO TIMES A DAY 10.2 [name] 1   Continuous Blood Gluc Receiver (FREESTYLE LIBRE READER) DEVI 1 Device  by Does not apply route as directed. 6 each 3   Continuous Blood Gluc Sensor (FREESTYLE LIBRE SENSOR SYSTEM) MISC 1 Device by Does not apply route 3 (three) times daily as needed. 6 each 2   glucose blood (ACCU-CHEK AVIVA PLUS) test strip Use to test blood sugar 4 times daily 100 each 3   hydrochlorothiazide (HYDRODIURIL) 25 MG tablet TAKE ONE TABLET BY MOUTH DAILY 90 tablet 2   insulin glargine (LANTUS SOLOSTAR) 100 UNIT/ML Solostar Pen INJECT 65 UNITS INTO Kimberly SKIN EVERY NIGHT AT BEDTIME 15 mL 3   JANUMET XR 50-1000 MG TB24 Take 1 tablet by mouth at bedtime. 90 tablet 1   NON FORMULARY [facility] Peripheral Neuropathy Cream: Bupivacaine 1%; Doxepin 3%; Gabapentin 6%; Pentoxifylline 3%; Topiramate 1%  Faxed over to [place] - 05/02/19 with 5 refills     pravastatin  (PRAVACHOL) 20 MG tablet TAKE 1 TABLET BY MOUTH 4-5 TIMES PER WEEK 75 tablet 1   No current facility-administered medications for this visit.    Allergies:   Ace inhibitors    Social History:  Kimberly [name]  reports that she has never smoked. She has never used smokeless tobacco. She reports current alcohol use. She reports that she does not use drugs.   Family History:  Kimberly [name]'s family history includes Depression in her mother; Diabetes in her mother; Heart attack (age of onset: 42) in her father; Heart disease in her father; Hypertension in her mother; Lung cancer in her brother; Uterine cancer in her sister.    ROS:  Please see Kimberly history of present illness.   Otherwise, review of systems are positive for none.   All other systems are reviewed and negative.    PHYSICAL EXAM: VS:  BP 138/64 (BP Location: Left Arm, [name] Position: Sitting, Cuff Size: Normal)    Pulse 80    Ht $R'5\' 11"'iG$  (1.803 m)    Wt 190 lb (86.2 kg)    BMI 26.50 kg/m  , BMI Body mass index is 26.5 kg/m. GENERAL:  Well appearing HEENT: Pupils equal round and reactive, fundi not visualized, oral mucosa unremarkable NECK:  No jugular venous distention, waveform within normal limits, carotid upstroke brisk and symmetric, no bruits LUNGS:  Clear to auscultation bilaterally HEART:  RRR.  PMI not displaced or sustained,S1 and S2 within normal limits, no S3, no S4, no clicks, no rubs, no murmurs ABD:  Flat, positive bowel sounds normal in frequency in pitch, no bruits, no rebound, no guarding, no midline pulsatile mass, no hepatomegaly, no splenomegaly EXT:  2 plus pulses throughout, no edema, no cyanosis no clubbing.  R knee effusion SKIN:  No rashes no nodules NEURO:  Cranial nerves II through XII grossly intact, motor grossly intact throughout PSYCH:  Cognitively intact, oriented to person place and time   EKG:  EKG is not ordered today. Kimberly ekg ordered today demonstrates sinus rhythm.  Rate 96 bpm.  LAFB.  LVH with  repolarization abnormalities  ETT 11/14/19:  There was no ST segment deviation noted during stress.  No evidence of ischemia  Hypertensive response to exercise, peak BP 227/63  Recent Labs: 01/29/2020: BUN 37; Creatinine, Ser 1.65; Potassium 4.1; Sodium 136    Lipid Panel    Component Value Date/Time   CHOL 220 (H) 02/17/2020 0949   TRIG 169 (H) 02/17/2020 0949   HDL 44 (L) 02/17/2020 0949   CHOLHDL 5.0 (H) 02/17/2020 0949   VLDL 52.6 (H) 10/07/2019 1027   LDLCALC 145 (H) 02/17/2020  0949   LDLDIRECT 158.0 10/07/2019 1027      Wt Readings from Last 3 Encounters:  05/01/20 190 lb (86.2 kg)  02/17/20 186 lb (84.4 kg)  01/29/20 188 lb 6.4 oz (85.5 kg)      ASSESSMENT AND PLAN:  # Chest pain: Resolved.  ETT negative for ischemia.   # Hyperlipidemia: Kimberly [name] has not tolerated simvastatin or rosuvastatin.  She is doing okay with pravastatin and her lipids are improving.  They are still not at goal.  Although she does have diabetes and high ASCVD risk, she does not qualify for a PCSK9 inhibitor.  She is hesitant to start more medications, or we could consider Zetia.  She wants to keep focusing on diet and exercise.  We had a discussion about healthy foods and following a moment Mediterranean type diet.  We also discussed low glycemic fruits given that she really likes fruit and her A1c is elevated.  # Essential hypertension: Her blood pressure is continuing to improve.  She was congratulated on her exercise.  Continue amlodipine and HCTZ.  # CKD 3b:  Renal function stable.   Current medicines are reviewed at length with Kimberly [name] today.  Kimberly [name] does not have concerns regarding medicines.  Kimberly following changes have been made:  no change  Labs/ tests ordered today include:   No orders of Kimberly defined types were placed in this encounter.    Disposition:   FU with Kimberly Dwight C. Oval Linsey, MD, [facility] in 6 months     Signed, Kimberly Schank C. Oval Linsey, MD, [facility]  05/01/2020  2:15 PM    [facility]

## 2020-05-01 NOTE — Patient Instructions (Signed)
Medication Instructions:  Your physician recommends that you continue on your current medications as directed. Please refer to the Current Medication list given to you today.  *If you need a refill on your cardiac medications before your next appointment, please call your pharmacy*  Lab Work: NONE  Testing/Procedures: NONE  Follow-Up: At [facility], you and your health needs are our priority.  As part of our continuing mission to provide you with exceptional heart care, we have created designated Provider Care Teams.  These Care Teams include your primary Cardiologist (physician) and Advanced Practice Providers (APPs -  Physician Assistants and Nurse Practitioners) who all work together to provide you with the care you need, when you need it.  We recommend signing up for the patient portal called "MyChart".  Sign up information is provided on this After Visit Summary.  MyChart is used to connect with patients for Virtual Visits (Telemedicine).  Patients are able to view lab/test results, encounter notes, upcoming appointments, etc.  Non-urgent messages can be sent to your provider as well.   To learn more about what you can do with MyChart, go to [email or web address].    Your next appointment:   6 month(s)  The format for your next appointment:   In Person  Provider:   You may see [facility]  or one of the following Advanced Practice Providers on your designated Care Team:    Kerin Ransom, PA-C  [place], [place]  Coletta Memos, [place]

## 2020-05-03 DIAGNOSIS — Z13820 Encounter for screening for osteoporosis: Secondary | ICD-10-CM | POA: Diagnosis not present

## 2020-05-04 ENCOUNTER — Ambulatory Visit: Payer: Medicare Other | Attending: Internal Medicine

## 2020-05-04 DIAGNOSIS — Z23 Encounter for immunization: Secondary | ICD-10-CM

## 2020-05-04 NOTE — Progress Notes (Signed)
   Covid-19 Vaccination Clinic  Name:  Kimberly [name]    MRN: [id number] DOB: 15-Jan-1956  05/04/2020  Kimberly [name] was observed post Covid-19 immunization for 15 minutes without incident. She was provided with Vaccine Information Sheet and instruction to access the V-Safe system.   Kimberly [name] was instructed to call 911 with any severe reactions post vaccine: Marland Kitchen Difficulty breathing  . Swelling of face and throat  . A fast heartbeat  . A bad rash all over body  . Dizziness and weakness

## 2020-05-06 ENCOUNTER — Telehealth: Payer: Self-pay | Admitting: Family Medicine

## 2020-05-06 NOTE — Telephone Encounter (Signed)
Pt wants  Blood Glucose Monitoring that's covered by her insurance. I informed the pt she might have to check with her insurance. she wants the dr to recommend an affordable one

## 2020-05-08 MED ORDER — CONTOUR NEXT TEST VI STRP: ORAL_STRIP | 12 refills | Status: DC

## 2020-05-08 MED ORDER — ACCU-CHEK GUIDE W/DEVICE KIT: PACK | 1 refills | Status: DC

## 2020-05-08 MED ORDER — ACCU-CHEK GUIDE VI STRP: ORAL_STRIP | 12 refills | Status: DC

## 2020-05-08 MED ORDER — CONTOUR NEXT MONITOR W/DEVICE KIT: PACK | 1 refills | Status: AC

## 2020-05-08 MED ORDER — LANCETS 30G MISC: 12 refills | Status: DC

## 2020-05-08 NOTE — Telephone Encounter (Signed)
Supplies sent in, pharmacy will let us know if this is not the preferred.

## 2020-05-08 NOTE — Addendum Note (Signed)
Addended by: Rodrigo Ran on: 05/08/2020 02:39 PM   Modules accepted: Orders

## 2020-05-11 ENCOUNTER — Ambulatory Visit: Payer: Medicare Other

## 2020-05-14 ENCOUNTER — Ambulatory Visit: Payer: Medicare Other

## 2020-06-08 ENCOUNTER — Telehealth (INDEPENDENT_AMBULATORY_CARE_PROVIDER_SITE_OTHER): Payer: Medicare Other | Admitting: Family Medicine

## 2020-06-08 ENCOUNTER — Encounter: Payer: Self-pay | Admitting: Family Medicine

## 2020-06-08 VITALS — Temp 98.0°F | Wt 180.0 lb

## 2020-06-08 DIAGNOSIS — R0982 Postnasal drip: Secondary | ICD-10-CM

## 2020-06-08 DIAGNOSIS — R059 Cough, unspecified: Secondary | ICD-10-CM

## 2020-06-08 DIAGNOSIS — Z7189 Other specified counseling: Secondary | ICD-10-CM

## 2020-06-08 MED ORDER — CETIRIZINE HCL 10 MG PO TABS: 10.0000 mg | ORAL_TABLET | Freq: Every day | ORAL | 0 refills | Status: DC

## 2020-06-08 MED ORDER — FLUTICASONE PROPIONATE 50 MCG/ACT NA SUSP: 1.0000 | Freq: Every day | NASAL | 0 refills | Status: DC

## 2020-06-08 NOTE — Progress Notes (Signed)
Virtual Visit via Video Note  I connected with Kimberly [name] on 06/08/20 at 10:30 AM EST by a video enabled telemedicine application 2/2 [id number] pandemic and verified that I am speaking with the correct person using two identifiers.  Location patient: home Location provider:work or home office Persons participating in the virtual visit: patient, provider  I discussed the limitations of evaluation and management by telemedicine and the availability of in person appointments. The patient expressed understanding and agreed to proceed.   HPI: Pt is a 64 year old female with past medical history significant for HTN, cough variant asthma, GERD, poorly controlled type II DM, goiter, peripheral neuropathy, CKD 3, HLD, anxiety, cataract, chronic knee pain who is followed by Dr. Martinique and seen today for acute concern.  Pt feels fatigued, mouth dry, cough, HA, eyes watery and scratchy, and scratchy throat since last wk. denies fever, chills, nausea, vomiting, ear pain or pressure, facial pain or pressure, sick contacts.  Pt took tylenol and drank herbal tea.  Also tried alka-seltzer.    Temp 98.46F today.  Was 58 F.   Had flu shot on 04/23/20 at drug store.  Pt not using symbicort inhaler BID.    ROS: See pertinent positives and negatives per HPI.  Past Medical History:  Diagnosis Date  . Cataract    OD  . Diabetes mellitus 2001  . Hyperlipidemia Dx 2012  . Hypertension 2001    Past Surgical History:  Procedure Laterality Date  . ABDOMINAL HYSTERECTOMY    . CATARACT EXTRACTION Left   . EYE SURGERY Left    Cat Sx    Family History  Problem Relation Age of Onset  . Depression Mother   . Hypertension Mother   . Diabetes Mother   . Heart disease Father   . Heart attack Father 38  . Uterine cancer Sister   . Lung cancer Brother     Current Outpatient Medications:  .  amLODipine (NORVASC) 10 MG tablet, Take 1 tablet (10 mg total) by mouth daily., Disp: 90 tablet, Rfl: 3 .  BD PEN  NEEDLE NANO U/F 32G X 4 MM MISC, USE AS DIRECTED ONCE DAILY, Disp: 100 each, Rfl: 4 .  benzonatate (TESSALON) 200 MG capsule, TAKE ONE CAPSULE BY MOUTH THREE TIMES A DAY AS NEEDED FOR COUGH, Disp: 30 capsule, Rfl: 3 .  Blood Glucose Monitoring Suppl (CONTOUR NEXT MONITOR) w/Device KIT, Use to test blood sugars 4 times daily. Dx: e11.9, Disp: 1 kit, Rfl: 1 .  budesonide-formoterol (SYMBICORT) 160-4.5 MCG/ACT inhaler, INHALE TWO PUFFS BY MOUTH TWO TIMES A DAY, Disp: 10.2 g, Rfl: 1 .  glucose blood (CONTOUR NEXT TEST) test strip, Use to test blood sugars 4 times daily. Dx:e11.9, Disp: 400 each, Rfl: 12 .  hydrochlorothiazide (HYDRODIURIL) 25 MG tablet, TAKE ONE TABLET BY MOUTH DAILY, Disp: 90 tablet, Rfl: 2 .  insulin glargine (LANTUS SOLOSTAR) 100 UNIT/ML Solostar Pen, INJECT 65 UNITS INTO THE SKIN EVERY NIGHT AT BEDTIME, Disp: 15 mL, Rfl: 3 .  JANUMET XR 50-1000 MG TB24, Take 1 tablet by mouth at bedtime., Disp: 90 tablet, Rfl: 1 .  Lancets 30G MISC, Use to check blood sugars 4 times daily. Dx:e11.9. please let us know if not covered, Disp: 400 each, Rfl: 12 .  NON FORMULARY, [facility] Peripheral Neuropathy Cream: Bupivacaine 1%; Doxepin 3%; Gabapentin 6%; Pentoxifylline 3%; Topiramate 1%  Faxed over to [place] - 05/02/19 with 5 refills, Disp: , Rfl:  .  pravastatin (PRAVACHOL) 20 MG tablet, TAKE 1 TABLET  BY MOUTH 4-5 TIMES PER WEEK, Disp: 75 tablet, Rfl: 1  EXAM:  VITALS per patient if applicable: 93.9S, RR between 12-20 bpm  GENERAL: alert, oriented, appears well and in no acute distress  HEENT: atraumatic, conjunctiva clear, no obvious abnormalities on inspection of external nose and ears  NECK: normal movements of the head and neck  LUNGS: on inspection no signs of respiratory distress, breathing rate appears normal, no obvious gross SOB, gasping or wheezing  CV: no obvious cyanosis  MS: moves all visible extremities without noticeable abnormality  PSYCH/NEURO:  pleasant and cooperative, no obvious depression or anxiety, speech and thought processing grossly intact  ASSESSMENT AND PLAN:  Discussed the following assessment and plan: Cough -Likely 2/2 postnasal drainage versus URI.  Also consider COVID-19 -Discussed supportive care -Consider Covid testing -Okay to take Ladona Ridgel, patient has prescription which is not expired. -Advised to use Symbicort inhaler twice daily  Post-nasal drainage -Discussed symptoms likely 2/2 allergies versus URI.  Also consider COVID-19. -Patient encouraged to continue Symbicort inhaler and other supportive care -Consider Covid testing -Given precautions for continued or worsening symptoms - Plan: cetirizine (ZYRTEC) 10 MG tablet, fluticasone (FLONASE) 50 MCG/ACT nasal spray  Educated about COVID-19 virus infection -discussed s/s of COVID 19 -Patient encouraged to have Covid testing done -Given precautions  F/u prn   I discussed the assessment and treatment plan with the patient. The patient was provided an opportunity to ask questions and all were answered. The patient agreed with the plan and demonstrated an understanding of the instructions.   The patient was advised to call back or seek an in-person evaluation if the symptoms worsen or if the condition fails to improve as anticipated.    Billie Ruddy, MD

## 2020-06-11 ENCOUNTER — Other Ambulatory Visit: Payer: Medicare Other

## 2020-06-11 DIAGNOSIS — Z20822 Contact with and (suspected) exposure to covid-19: Secondary | ICD-10-CM

## 2020-06-13 LAB — SARS-COV-2, NAA 2 DAY TAT

## 2020-06-13 LAB — NOVEL CORONAVIRUS, NAA: SARS-CoV-2, NAA: NOT DETECTED

## 2020-06-19 ENCOUNTER — Other Ambulatory Visit: Payer: Self-pay | Admitting: Family Medicine

## 2020-06-19 DIAGNOSIS — Z794 Long term (current) use of insulin: Secondary | ICD-10-CM

## 2020-06-19 DIAGNOSIS — E1140 Type 2 diabetes mellitus with diabetic neuropathy, unspecified: Secondary | ICD-10-CM

## 2020-06-23 ENCOUNTER — Telehealth: Payer: Self-pay | Admitting: Family Medicine

## 2020-06-23 MED ORDER — BENZONATATE 200 MG PO CAPS: ORAL_CAPSULE | ORAL | 3 refills | Status: DC

## 2020-06-23 NOTE — Telephone Encounter (Signed)
Rx sent in

## 2020-06-23 NOTE — Telephone Encounter (Signed)
Patient is calling and requesting a refill for benzonatate (TESSALON) 200 MG capsule sent to [facility] [address]  [address] [place], [place] [address]  Phone:  [phone] Fax:  [phone]  CB is [phone]

## 2020-07-09 ENCOUNTER — Other Ambulatory Visit: Payer: Self-pay | Admitting: Family Medicine

## 2020-07-09 DIAGNOSIS — E1142 Type 2 diabetes mellitus with diabetic polyneuropathy: Secondary | ICD-10-CM

## 2020-07-09 DIAGNOSIS — E1165 Type 2 diabetes mellitus with hyperglycemia: Secondary | ICD-10-CM

## 2020-07-27 ENCOUNTER — Ambulatory Visit: Payer: Medicare Other | Admitting: Family Medicine

## 2020-07-29 ENCOUNTER — Ambulatory Visit (INDEPENDENT_AMBULATORY_CARE_PROVIDER_SITE_OTHER): Payer: Medicare Other | Admitting: Family Medicine

## 2020-07-29 ENCOUNTER — Other Ambulatory Visit: Payer: Self-pay

## 2020-07-29 ENCOUNTER — Encounter: Payer: Self-pay | Admitting: Family Medicine

## 2020-07-29 VITALS — BP 130/70 | HR 74 | Resp 16 | Ht 71.0 in | Wt 190.0 lb

## 2020-07-29 DIAGNOSIS — Z794 Long term (current) use of insulin: Secondary | ICD-10-CM | POA: Diagnosis not present

## 2020-07-29 DIAGNOSIS — I10 Essential (primary) hypertension: Secondary | ICD-10-CM

## 2020-07-29 DIAGNOSIS — E1140 Type 2 diabetes mellitus with diabetic neuropathy, unspecified: Secondary | ICD-10-CM

## 2020-07-29 DIAGNOSIS — N183 Chronic kidney disease, stage 3 unspecified: Secondary | ICD-10-CM | POA: Diagnosis not present

## 2020-07-29 DIAGNOSIS — R059 Cough, unspecified: Secondary | ICD-10-CM | POA: Diagnosis not present

## 2020-07-29 DIAGNOSIS — L989 Disorder of the skin and subcutaneous tissue, unspecified: Secondary | ICD-10-CM

## 2020-07-29 NOTE — Progress Notes (Signed)
Chief Complaint  Patient presents with  . Flank Pain    On the left side, around rib cage area - comes and goes. Not currently hurting.    HPI: [name] is a 65 y.o. female, who is here today with above concern. "For some time" she has felt a tender "knot" in left posterior rib cage. Pain is exacerbated by palpation,certain movements,and with coughing. She has not noted skin changes. Pain is intermittent, soreness, moderate, not radiated. Last night it was 9/10, most of the time 6/10. No hx of recent injury.  She would like to have her HgA1C checked. DM II: She is on Lantus 65 U daily and Janumet 50-1000 mg 1 tab daily. Negative for abdominal pain, nausea,vomiting, polydipsia,polyuria, or polyphagia. She started going to the gyn 2 days ago. Walking on tread mill. BS's: Not checking.  Lab Results  Component Value Date   HGBA1C 8.3 (A) 02/17/2020   Lab Results  Component Value Date   MICROALBUR 14.5 (H) 10/07/2019   MICROALBUR 39.3 (H) 06/07/2019   Still having cough, 2 months of non productive. She has not identified exacerbating or alleviating factors. No associated wheezing or SOB.  She was seen on 06/08/20. Symbicort daily since 05/2020 and has helped some. + Post nasal drainage and rhinorrhea. Negative for heartburn or nausea.  HTN: Negative for severe/frequent headache, visual changes, chest pain, dyspnea, palpitation, focal weakness, or edema. She is on HCTZ 25 mg daily and Amlodipine 10 mg daily. CKD III: She has not noted foam in urine,gross hematuria,or decreased urine outout.  Lab Results  Component Value Date   CREATININE 1.65 (H) 01/29/2020   BUN 37 (H) 01/29/2020   NA 136 01/29/2020   K 4.1 01/29/2020   CL 96 01/29/2020   CO2 21 01/29/2020   Review of Systems  Constitutional: Positive for fatigue. Negative for activity change, appetite change and fever.  HENT: Negative for mouth sores, nosebleeds and sore throat.   Gastrointestinal:  Negative for abdominal pain and vomiting.       Negative for changes in bowel habits.  Musculoskeletal: Positive for arthralgias.  Neurological: Negative for syncope, facial asymmetry and weakness.  Psychiatric/Behavioral: Positive for sleep disturbance (Sleeping about 5 hours.). Negative for confusion.       She is trying to go to bed earlier, 11: pm. She is used to go to bed around 1 am. Takes naps during the day as needed.  Rest see pertinent positives and negatives per HPI.  Current Outpatient Medications on File Prior to Visit  Medication Sig Dispense Refill  . amLODipine (NORVASC) 10 MG tablet Take 1 tablet (10 mg total) by mouth daily. 90 tablet 3  . BD PEN NEEDLE NANO U/F 32G X 4 MM MISC USE AS DIRECTED ONCE DAILY 100 each 4  . Blood Glucose Monitoring Suppl (CONTOUR NEXT MONITOR) w/Device KIT Use to test blood sugars 4 times daily. Dx: e11.9 1 kit 1  . budesonide-formoterol (SYMBICORT) 160-4.5 MCG/ACT inhaler INHALE TWO PUFFS BY MOUTH TWO TIMES A DAY 10.2 g 1  . cetirizine (ZYRTEC) 10 MG tablet Take 1 tablet (10 mg total) by mouth daily. 30 tablet 0  . Continuous Blood Gluc Sensor (FREESTYLE LIBRE 2 SENSOR) MISC TEST BLOOD SUGAR THREE TIMES DAILY AS NEEDED CHANGE SENSOR EVERY 2 WEEKS 6 each 3  . fluticasone (FLONASE) 50 MCG/ACT nasal spray Place 1 spray into both nostrils daily. 16 g 0  . glucose blood (CONTOUR NEXT TEST) test strip Use to test blood  sugars 4 times daily. Dx:e11.9 400 each 12  . hydrochlorothiazide (HYDRODIURIL) 25 MG tablet TAKE ONE TABLET BY MOUTH DAILY 90 tablet 2  . JANUMET XR 50-1000 MG TB24 Take 1 tablet by mouth at bedtime. 90 tablet 1  . Lancets 30G MISC Use to check blood sugars 4 times daily. Dx:e11.9. please let us know if not covered 400 each 12  . LANTUS SOLOSTAR 100 UNIT/ML Solostar Pen INJECT 65 UNITS INTO THE SKIN EVERY NIGHT AT BEDTIME 15 mL 3  . NON FORMULARY [facility] Peripheral Neuropathy Cream: Bupivacaine 1%; Doxepin 3%; Gabapentin 6%;  Pentoxifylline 3%; Topiramate 1%  Faxed over to [facility] - 05/02/19 with 5 refills    . pravastatin (PRAVACHOL) 20 MG tablet TAKE 1 TABLET BY MOUTH 4-5 TIMES PER WEEK 75 tablet 1   No current facility-administered medications on file prior to visit.     Past Medical History:  Diagnosis Date  . Cataract    OD  . Diabetes mellitus 2001  . Hyperlipidemia Dx 2012  . Hypertension 2001   Allergies  Allergen Reactions  . Ace Inhibitors Cough    Social History   Socioeconomic History  . Marital status: Divorced    Spouse name: Not on file  . Number of children: Not on file  . Years of education: Not on file  . Highest education level: Not on file  Occupational History  . Not on file  Tobacco Use  . Smoking status: Never Smoker  . Smokeless tobacco: Never Used  Vaping Use  . Vaping Use: Never used  Substance and Sexual Activity  . Alcohol use: Yes    Comment: occ  . Drug use: No  . Sexual activity: Not on file  Other Topics Concern  . Not on file  Social History Narrative  . Not on file   Social Determinants of Health   Financial Resource Strain: Not on file  Food Insecurity: Not on file  Transportation Needs: Not on file  Physical Activity: Not on file  Stress: Not on file  Social Connections: Not on file   Vitals:   07/29/20 1426  BP: 130/70  Pulse: 74  Resp: 16   Body mass index is 26.5 kg/m.  Physical Exam Vitals and nursing note reviewed.  Constitutional:      General: She is not in acute distress.    Appearance: She is well-developed.  HENT:     Head: Normocephalic and atraumatic.     Mouth/Throat:     Mouth: Oropharynx is clear and moist and mucous membranes are normal. Mucous membranes are moist.     Pharynx: Oropharynx is clear.  Eyes:     Conjunctiva/sclera: Conjunctivae normal.  Neck:     Vascular: No carotid bruit.  Cardiovascular:     Rate and Rhythm: Normal rate and regular rhythm.     Pulses:          Dorsalis pedis  pulses are 2+ on the right side and 2+ on the left side.     Heart sounds: No murmur heard.   Pulmonary:     Effort: Pulmonary effort is normal. No respiratory distress.     Breath sounds: Normal breath sounds.  Chest:    Abdominal:     Palpations: Abdomen is soft. There is no hepatomegaly or mass.     Tenderness: There is no abdominal tenderness.  Musculoskeletal:        General: No edema.  Lymphadenopathy:     Cervical: No cervical adenopathy.  Skin:    General: Skin is warm.     Findings: No erythema or rash.  Neurological:     General: No focal deficit present.     Mental Status: She is alert and oriented to person, place, and time.     Cranial Nerves: No cranial nerve deficit.     Gait: Gait normal.     Deep Tendon Reflexes: Strength normal.  Psychiatric:        Mood and Affect: Mood and affect normal.     Comments: Well groomed, good eye contact.    ASSESSMENT AND PLAN:  [name] was seen today for flank pain.  Diagnoses and all orders for this visit: Orders Placed This Encounter  Procedures  . DG Chest 2 View  . [place] CHEST SOFT TISSUE  . Basic metabolic panel  . Fructosamine  . Hemoglobin A1c  . Microalbumin / creatinine urine ratio     Lab Results  Component Value Date   CREATININE 1.90 (H) 07/29/2020   BUN 38 (H) 07/29/2020   NA 136 07/29/2020   K 4.1 07/29/2020   CL 100 07/29/2020   CO2 28 07/29/2020   Lab Results  Component Value Date   MICROALBUR 129.4 (H) 07/29/2020   MICROALBUR 14.5 (H) 10/07/2019   Lab Results  Component Value Date   HGBA1C 8.1 (H) 07/29/2020   Cough We discussed possible etiologies. Lung auscultation negative today.  She has had episodes of cough intermittently for a while. She has been evaluated by pulmonologist due to cough, cough variant asthma. Continue Symbicort 160-4.5 mcg bid daily. Further recommendations according to CXR results.  Lesion of subcutaneous tissue Most likely benign,? Lipoma. Soft tissue US  will be arranged. Continue monitoring for changes.  Type 2 diabetes mellitus with diabetic neuropathy, with long-term current use of insulin (HCC) HgA1C has not been at goal. Continue Lantus 65 units daily and Janumet XR 50-1000 mg daily. Encouraged to continue regular exercise. Annual eye exam and foot care recommended. F/U in 4 months   CKD (chronic kidney disease) stage 3, GFR 30-59 ml/min Adequate BP and glucose control. Continue avoiding NSAIDs. Low-salt diet and adequate hydration.  Essential hypertension BP adequately controlled. Continue Amlodipine 10 mg daily and HCTZ 25 mg daily. Continue low-salt diet. Monitor BP regularly. Eye exam is current.  Spent 41 minutes.  During this time history was obtained and documented, examination was performed, prior labs reviewed, and assessment/plan discussed.  Return in about 4 months (around 11/26/2020).   Betty G. Martinique, MD  [facility] [place] office.   A few things to remember from today's visit:   Cough - Plan: DG Chest 2 View  Essential hypertension - Plan: Basic metabolic panel  Stage 3 chronic kidney disease, unspecified whether stage 3a or 3b CKD ([place]) - Plan: Microalbumin / creatinine urine ratio  Type 2 diabetes mellitus with diabetic neuropathy, with long-term current use of insulin (HCC) - Plan: Fructosamine, Hemoglobin A1c, Microalbumin / creatinine urine ratio  Lesion of subcutaneous tissue - Plan: [place] CHEST SOFT TISSUE  If you need refills please call your pharmacy. Do not use My Chart to request refills or for acute issues that need immediate attention.   Ultrasound will be arranged. Continue regular physical activity.  Please be sure medication list is accurate. If a new problem present, please set up appointment sooner than planned today.

## 2020-07-29 NOTE — Assessment & Plan Note (Signed)
HgA1C has not been at goal. Continue Lantus 65 units daily and Janumet XR 50-1000 mg daily. Encouraged to continue regular exercise. Annual eye exam and foot care recommended. F/U in 4 months

## 2020-07-29 NOTE — Assessment & Plan Note (Signed)
BP adequately controlled. Continue Amlodipine 10 mg daily and HCTZ 25 mg daily. Continue low-salt diet. Monitor BP regularly. Eye exam is current.

## 2020-07-29 NOTE — Assessment & Plan Note (Signed)
Adequate BP and glucose control. Continue avoiding NSAIDs. Low-salt diet and adequate hydration.

## 2020-07-29 NOTE — Patient Instructions (Addendum)
A few things to remember from today's visit:   Cough - Plan: DG Chest 2 View  Essential hypertension - Plan: Basic metabolic panel  Stage 3 chronic kidney disease, unspecified whether stage 3a or 3b CKD ([place]) - Plan: Microalbumin / creatinine urine ratio  Type 2 diabetes mellitus with diabetic neuropathy, with long-term current use of insulin (HCC) - Plan: Fructosamine, Hemoglobin A1c, Microalbumin / creatinine urine ratio  Lesion of subcutaneous tissue - Plan: [place] CHEST SOFT TISSUE  If you need refills please call your pharmacy. Do not use My Chart to request refills or for acute issues that need immediate attention.   Ultrasound will be arranged. Continue regular physical activity.  Please be sure medication list is accurate. If a new problem present, please set up appointment sooner than planned today.

## 2020-07-30 LAB — MICROALBUMIN / CREATININE URINE RATIO
Creatinine,U: 83.6 mg/dL
Microalb Creat Ratio: 154.8 mg/g — ABNORMAL HIGH (ref 0.0–30.0)
Microalb, Ur: 129.4 mg/dL — ABNORMAL HIGH (ref 0.0–1.9)

## 2020-07-30 LAB — BASIC METABOLIC PANEL
BUN: 38 mg/dL — ABNORMAL HIGH (ref 6–23)
CO2: 28 mEq/L (ref 19–32)
Calcium: 10.1 mg/dL (ref 8.4–10.5)
Chloride: 100 mEq/L (ref 96–112)
Creatinine, Ser: 1.9 mg/dL — ABNORMAL HIGH (ref 0.40–1.20)
GFR: 27.11 mL/min — ABNORMAL LOW (ref >60.00)
Glucose, Bld: 223 mg/dL — ABNORMAL HIGH (ref 70–99)
Potassium: 4.1 mEq/L (ref 3.5–5.1)
Sodium: 136 mEq/L (ref 135–145)

## 2020-07-30 LAB — HEMOGLOBIN A1C: Hgb A1c MFr Bld: 8.1 % — ABNORMAL HIGH (ref 4.6–6.5)

## 2020-07-31 LAB — FRUCTOSAMINE: Fructosamine: 333 umol/L — ABNORMAL HIGH (ref 205–285)

## 2020-08-03 ENCOUNTER — Encounter: Payer: Self-pay | Admitting: Family Medicine

## 2020-08-05 ENCOUNTER — Ambulatory Visit
Admission: RE | Admit: 2020-08-05 | Discharge: 2020-08-05 | Disposition: A | Discharge status: Home / Self Care | Payer: Medicare Other | Source: Ambulatory Visit | Attending: Family Medicine | Admitting: Family Medicine

## 2020-08-05 DIAGNOSIS — L989 Disorder of the skin and subcutaneous tissue, unspecified: Secondary | ICD-10-CM

## 2020-08-05 DIAGNOSIS — R911 Solitary pulmonary nodule: Secondary | ICD-10-CM | POA: Diagnosis not present

## 2020-08-10 ENCOUNTER — Other Ambulatory Visit: Payer: Medicare Other

## 2020-08-20 ENCOUNTER — Ambulatory Visit (INDEPENDENT_AMBULATORY_CARE_PROVIDER_SITE_OTHER): Payer: Medicare Other

## 2020-08-20 ENCOUNTER — Encounter: Payer: Self-pay | Admitting: Family Medicine

## 2020-08-20 ENCOUNTER — Ambulatory Visit (INDEPENDENT_AMBULATORY_CARE_PROVIDER_SITE_OTHER): Payer: Medicare Other | Admitting: Family Medicine

## 2020-08-20 ENCOUNTER — Other Ambulatory Visit: Payer: Self-pay

## 2020-08-20 VITALS — BP 110/75 | HR 73 | Temp 98.1°F | Wt 188.2 lb

## 2020-08-20 DIAGNOSIS — M25561 Pain in right knee: Secondary | ICD-10-CM

## 2020-08-20 DIAGNOSIS — D171 Benign lipomatous neoplasm of skin and subcutaneous tissue of trunk: Secondary | ICD-10-CM | POA: Diagnosis not present

## 2020-08-20 DIAGNOSIS — G8929 Other chronic pain: Secondary | ICD-10-CM

## 2020-08-20 NOTE — Patient Instructions (Signed)
Chronic Knee Pain, Adult Knee pain that lasts longer than 3 months is called chronic knee pain. You may have pain in one or both knees. Symptoms of chronic knee pain may also include swelling and stiffness. The most common cause is age-related wear and tear (osteoarthritis) of your knee joint. Many conditions can cause chronic knee pain. Treatment depends on the cause. The main treatments are physical therapy and weight loss. It may also be treated with medicines, injections, a knee sleeve or brace, and by using crutches. Rest, ice, pressure (compression), and elevation, also known as RICE therapy, may also be recommended. Follow these instructions at home: If you have a knee sleeve or brace:  Wear the knee sleeve or brace as told by your doctor. Take it off only as told by your doctor.  Loosen it if your toes: ? Tingle. ? Become numb. ? Turn cold and blue.  Keep it clean.  If the sleeve or brace is not waterproof: ? Do not let it get wet. ? Ask your doctor if you may take it off when you take a bath or shower. If not, cover it with a watertight covering.   Managing pain, stiffness, and swelling  If told, put heat on your knee. Do this as often as told by your doctor. Use the heat source that your doctor recommends, such as a moist heat pack or a heating pad. ? If you have a removable knee sleeve or brace, take it off as told by your doctor. ? Place a towel between your skin and the heat source. ? Leave the heat on for 20-30 minutes. ? Take off the heat if your skin turns bright red. This is very important. If you cannot feel pain, heat, or cold, you have a greater risk of getting burned.  If told, put ice on your knee. To do this: ? If you have a removable knee sleeve or brace, take it off as told by your doctor. ? Put ice in a plastic bag. ? Place a towel between your skin and the bag. ? Leave the ice on for 20 minutes, 2-3 times a day. ? Take off the ice if your skin turns bright  red. This is very important. If you cannot feel pain, heat, or cold, you have a greater risk of damage to the area.  Move your toes often.  Raise the injured area above the level of your heart while you are sitting or lying down.      Activity  Avoid activities where both feet leave the ground at the same time (high-impact activities). Examples are running, jumping rope, and doing jumping jacks.  Follow the exercise plan that your doctor makes for you. Your doctor may suggest that you: ? Avoid activities that make knee pain worse. You may need to change the exercises that you do, the sports that you participate in, or your job duties. ? Wear shoes with cushioned soles. ? Avoid sports that require running and sudden changes in direction. ? Do exercises or physical therapy. This is planned to match your needs and your abilities. ? Do exercises that increase your balance and strength, such as tai chi and yoga.  Do not use your injured knee to support your body weight until your doctor says that you can. Use crutches as told by your doctor.  Return to your normal activities when your doctor says that it is safe. General instructions  Take over-the-counter and prescription medicines only as told by your   doctor.  If you are overweight, work with your doctor and a food expert (dietitian) to set goals to lose weight. Being overweight can make your knee hurt more.  Do not smoke or use any products that contain nicotine or tobacco. If you need help quitting, ask your doctor.  Keep all follow-up visits. Contact a doctor if:  You have knee pain that is not getting better or gets worse.  You are not able to do your exercises due to knee pain. Get help right away if:  Your knee swells and the swelling gets worse.  You cannot move your knee.  You have very bad knee pain. Summary  Knee pain that lasts more than 3 months is called chronic knee pain.  The main treatments for chronic knee  pain are physical therapy and weight loss. You may also need to take medicines, wear a knee sleeve or brace, use crutches, and put ice or heat on your knee.  Lose weight if you are overweight. Work with your doctor and a food expert (dietitian) to help you set goals to lose weight. Being overweight can make your knee hurt more.  Follow the exercise plan that your doctor makes for you. This information is not intended to replace advice given to you by your health care provider. Make sure you discuss any questions you have with your health care provider. Document Revised: 11/27/2019 Document Reviewed: 11/27/2019 Elsevier Patient Education  2021 Elsevier Inc.  Lipoma  A lipoma is a noncancerous (benign) tumor that is made up of fat cells. This is a very common type of soft-tissue growth. Lipomas are usually found under the skin (subcutaneous). They may occur in any tissue of the body that contains fat. Common areas for lipomas to appear include the back, arms, shoulders, buttocks, and thighs. Lipomas grow slowly, and they are usually painless. Most lipomas do not cause problems and do not require treatment. What are the causes? The cause of this condition is not known. What increases the risk? You are more likely to develop this condition if:  You are 34-28 years old.  You have a family history of lipomas. What are the signs or symptoms? A lipoma usually appears as a small, round bump under the skin. In most cases, the lump will:  Feel soft or rubbery.  Not cause pain or other symptoms. However, if a lipoma is located in an area where it pushes on nerves, it can become painful or cause other symptoms. How is this diagnosed? A lipoma can usually be diagnosed with a physical exam. You may also have tests to confirm the diagnosis and to rule out other conditions. Tests may include:  Imaging tests, such as a CT scan or an MRI.  Removal of a tissue sample to be looked at under a microscope  (biopsy). How is this treated? Treatment for this condition depends on the size of the lipoma and whether it is causing any symptoms.  For small lipomas that are not causing problems, no treatment is needed.  If a lipoma is bigger or it causes problems, surgery may be done to remove the lipoma. Lipomas can also be removed to improve appearance. Most often, the procedure is done after applying a medicine that numbs the area (local anesthetic).  Liposuction may be done to reduce the size of the lipoma before it is removed through surgery, or it may be done to remove the lipoma. Lipomas are removed with this method in order to limit incision size  and scarring. A liposuction tube is inserted through a small incision into the lipoma, and the contents of the lipoma are removed through the tube with suction. Follow these instructions at home:  Watch your lipoma for any changes.  Keep all follow-up visits as told by your health care provider. This is important. Contact a health care provider if:  Your lipoma becomes larger or hard.  Your lipoma becomes painful, red, or increasingly swollen. These could be signs of infection or a more serious condition. Get help right away if:  You develop tingling or numbness in an area near the lipoma. This could indicate that the lipoma is causing nerve damage. Summary  A lipoma is a noncancerous tumor that is made up of fat cells.  Most lipomas do not cause problems and do not require treatment.  If a lipoma is bigger or it causes problems, surgery may be done to remove the lipoma.  Contact a health care provider if your lipoma becomes larger or hard, or if it becomes painful, red, or increasingly swollen. Pain, redness, and swelling could be signs of infection or a more serious condition. This information is not intended to replace advice given to you by your health care provider. Make sure you discuss any questions you have with your health care  provider. Document Revised: 01/28/2019 Document Reviewed: 01/28/2019 Elsevier Patient Education  [place].

## 2020-08-20 NOTE — Progress Notes (Signed)
Subjective:    Patient ID: Kimberly [name], female    DOB: [birth year], 66 y.o.   MRN: [id number]  Chief Complaint  Patient presents with  . Pain    Left sided pain  . Knee Pain    Right knee    HPI Patient is a 65 year old female with past medical history significant for HTN, GERD, DM, CKD 3, chronic knee pain, anxiety, peripheral neuropathy, HLD who was seen today for chronic concerns.  Patient seen by PCP for left sided soft tissue mass 07/29/2020.  Recent ultrasound negative.  Patient endorses continued pain at site.  Pain was unbearable yesterday making it difficult to move around.  Patient denies injury, changes in skin, GERD.  Patient also notes right knee pain and edema at times.  Patient denies injury or history of gout.  Patient endorses eating seafood regularly.  Past Medical History:  Diagnosis Date  . Cataract    OD  . Diabetes mellitus 2001  . Hyperlipidemia Dx 2012  . Hypertension 2001    Allergies  Allergen Reactions  . Ace Inhibitors Cough    ROS General: Denies fever, chills, night sweats, changes in weight, changes in appetite HEENT: Denies headaches, ear pain, changes in vision, rhinorrhea, sore throat CV: Denies CP, palpitations, SOB, orthopnea Pulm: Denies SOB, cough, wheezing GI: Denies abdominal pain, nausea, vomiting, diarrhea, constipation GU: Denies dysuria, hematuria, frequency, vaginal discharge Msk: Denies muscle cramps, joint pains  + right knee pain, left side pain. Neuro: Denies weakness, numbness, tingling Skin: Denies rashes, bruising  + soft tissue mass Psych: Denies depression, anxiety, hallucinations      Objective:    Blood pressure 110/75, pulse 73, temperature 98.1 F (36.7 C), temperature source Oral, weight 188 lb 3.2 oz (85.4 kg), SpO2 94 %.  Gen. Pleasant, well-nourished, in no distress, normal affect  HEENT: [place]/AT, face symmetric, conjunctiva clear, no scleral icterus, PERRLA, EOMI, nares patent without drainage Lungs: no  accessory muscle use, CTAB, no wheezes or rales Cardiovascular: RRR, no m/r/g, no peripheral edema Abdomen: BS present, soft, NT/ND, no hepatosplenomegaly. Musculoskeletal: Crepitus of bilateral knees.  Mild TTP of right medial knee at joint line.  no edema, erythema, effusion of bilateral knees.  No deformities, no cyanosis or clubbing, normal tone   Neuro:  A&Ox3, CN II-XII intact, normal gait Skin:  Warm, no lesions/ rash.  Soft, superficial, mobile mass of left upper abdomen/lower rib cage mid sternal clavicular line   Wt Readings from Last 3 Encounters:  08/20/20 188 lb 3.2 oz (85.4 kg)  07/29/20 190 lb (86.2 kg)  06/08/20 180 lb (81.6 kg)    Lab Results  Component Value Date   WBC 5.4 01/23/2019   HGB 13.5 01/23/2019   HCT 40.4 01/23/2019   PLT 204.0 01/23/2019   GLUCOSE 223 (H) 07/29/2020   CHOL 220 (H) 02/17/2020   TRIG 169 (H) 02/17/2020   HDL 44 (L) 02/17/2020   LDLDIRECT 158.0 10/07/2019   LDLCALC 145 (H) 02/17/2020   ALT 17 02/04/2019   AST 17 02/04/2019   NA 136 07/29/2020   K 4.1 07/29/2020   CL 100 07/29/2020   CREATININE 1.90 (H) 07/29/2020   BUN 38 (H) 07/29/2020   CO2 28 07/29/2020   TSH 1.25 10/16/2017   HGBA1C 8.1 (H) 07/29/2020   MICROALBUR 129.4 (H) 07/29/2020    Assessment/Plan:  Chronic pain of right knee -Discussed possible causes including arthritis, gout -Discussed supportive care -We will obtain imaging.  Further recommendations based on imaging. - Plan:  DG Knee Complete 4 Views Right  Lipoma of torso -Unclear previous ultrasound was guided in the correct area -Given information about lipoma - Plan: CT Abdomen Pelvis Wo Contrast  F/u with PCP for worsening or continued symptoms.  Grier Mitts, MD

## 2020-08-21 ENCOUNTER — Other Ambulatory Visit: Payer: Self-pay

## 2020-08-21 DIAGNOSIS — D171 Benign lipomatous neoplasm of skin and subcutaneous tissue of trunk: Secondary | ICD-10-CM

## 2020-08-22 ENCOUNTER — Encounter: Payer: Self-pay | Admitting: Family Medicine

## 2020-08-28 ENCOUNTER — Other Ambulatory Visit: Payer: Self-pay | Admitting: Family Medicine

## 2020-08-28 DIAGNOSIS — E1142 Type 2 diabetes mellitus with diabetic polyneuropathy: Secondary | ICD-10-CM

## 2020-09-08 ENCOUNTER — Other Ambulatory Visit: Payer: Medicare Other

## 2020-09-11 ENCOUNTER — Other Ambulatory Visit: Payer: Medicare Other

## 2020-10-17 ENCOUNTER — Other Ambulatory Visit: Payer: Self-pay | Admitting: Family Medicine

## 2020-10-17 DIAGNOSIS — E1142 Type 2 diabetes mellitus with diabetic polyneuropathy: Secondary | ICD-10-CM

## 2020-11-09 ENCOUNTER — Other Ambulatory Visit: Payer: Self-pay | Admitting: Cardiovascular Disease

## 2020-11-09 DIAGNOSIS — I10 Essential (primary) hypertension: Secondary | ICD-10-CM

## 2020-12-05 ENCOUNTER — Other Ambulatory Visit: Payer: Self-pay | Admitting: Family Medicine

## 2020-12-07 ENCOUNTER — Telehealth: Payer: Self-pay | Admitting: Family Medicine

## 2020-12-08 ENCOUNTER — Encounter: Payer: Self-pay | Admitting: Family Medicine

## 2020-12-08 ENCOUNTER — Telehealth (INDEPENDENT_AMBULATORY_CARE_PROVIDER_SITE_OTHER): Payer: Medicare Other | Admitting: Family Medicine

## 2020-12-08 VITALS — HR 76 | Temp 98.0°F | Ht 71.0 in

## 2020-12-08 DIAGNOSIS — U071 COVID-19: Secondary | ICD-10-CM

## 2020-12-08 DIAGNOSIS — E1129 Type 2 diabetes mellitus with other diabetic kidney complication: Secondary | ICD-10-CM

## 2020-12-08 DIAGNOSIS — N183 Chronic kidney disease, stage 3 unspecified: Secondary | ICD-10-CM | POA: Diagnosis not present

## 2020-12-08 DIAGNOSIS — E1165 Type 2 diabetes mellitus with hyperglycemia: Secondary | ICD-10-CM

## 2020-12-08 DIAGNOSIS — I10 Essential (primary) hypertension: Secondary | ICD-10-CM

## 2020-12-08 MED ORDER — BENZONATATE 100 MG PO CAPS: 200.0000 mg | ORAL_CAPSULE | Freq: Two times a day (BID) | ORAL | 0 refills | Status: AC | PRN

## 2020-12-08 NOTE — Progress Notes (Signed)
Virtual Visit via Video Note I connected with Kimberly [name] on 12/08/20 by a video enabled telemedicine application and verified that I am speaking with the correct person using two identifiers.  Location patient: home Location provider:work office Persons participating in the virtual visit: patient, provider  I discussed the limitations of evaluation and management by telemedicine and the availability of in person appointments. The patient expressed understanding and agreed to proceed.  Chief Complaint  Patient presents with   Cough   Headache   Covid Positive    HPI: Kimberly [name] is a 65 yo female with hx of DM II,CKD,HTN,chronic cough, and peripheral neuropathy with above complaint. Started with symptoms a week ago. Positive COVID 19 home test 5 days ago and at [employer] [facility] 2 days ago. Fatigue,frontal pressure headache,sneezing,sore throat,nasal congestion,rhinorrhea,and post nasal drainage.  Negative for fever,anosmia and ageusia.  Her sisters also with symptoms and positive COVID 19 test. Stages that she did ride in the car with her sister, she was coughing but no more than usual. Most symptoms have resolved. COVID 19 vaccines completed and booster x 1. She wonders if she needs a second booster.  Cough is affecting her sleep, it is gradually improving. No associated wheezing or SOB. She is taking cough drops. Negative for abdominal pain,N/V,changes in bowel habits, or skin rash.  Concerned about renal function. Last follow up on 07/29/2020.  CKD 3: Follow-up BMP was not done. She has not noted gross hematuria, foam in urine, or decreased urine output. BP has been elevated for the past few days, she attributed to being upset with family members because she has been "blamed" for "giving" COVID 19 infection to her sisters. BP 160's/80's. Before acute illness BP was < 140/90.  Negative for CP, palpitations, focal neurologic deficit, or edema.  Hypertension: Currently she is on HCTZ  25 mg daily and amlodipine 10 mg daily, the latter one was increased by her cardiologist.  Lab Results  Component Value Date   CREATININE 1.90 (H) 07/29/2020   BUN 38 (H) 07/29/2020   NA 136 07/29/2020   K 4.1 07/29/2020   CL 100 07/29/2020   CO2 28 07/29/2020   DM II on Janumet XR 50-1000 mg daily and Lantus 65 U daily. BS's 90's-160's.  Lab Results  Component Value Date   HGBA1C 8.1 (H) 07/29/2020   ROS: See pertinent positives and negatives per HPI.  Past Medical History:  Diagnosis Date   Cataract    OD   Diabetes mellitus 2001   Hyperlipidemia Dx 2012   Hypertension 2001    Past Surgical History:  Procedure Laterality Date   ABDOMINAL HYSTERECTOMY     CATARACT EXTRACTION Left    EYE SURGERY Left    Cat Sx    Family History  Problem Relation Age of Onset   Depression Mother    Hypertension Mother    Diabetes Mother    Heart disease Father    Heart attack Father 33   Uterine cancer Sister    Lung cancer Brother     Social History   Socioeconomic History   Marital status: Divorced    Spouse name: Not on file   Number of children: Not on file   Years of education: Not on file   Highest education level: Not on file  Occupational History   Not on file  Tobacco Use   Smoking status: Never   Smokeless tobacco: Never  Vaping Use   Vaping Use: Never used  Substance and Sexual Activity  Alcohol use: Yes    Comment: occ   Drug use: No   Sexual activity: Not on file  Other Topics Concern   Not on file  Social History Narrative   Not on file   Social Determinants of Health   Financial Resource Strain: Not on file  Food Insecurity: Not on file  Transportation Needs: Not on file  Physical Activity: Not on file  Stress: Not on file  Social Connections: Not on file  Intimate Partner Violence: Not on file    Current Outpatient Medications:    amLODipine (NORVASC) 10 MG tablet, TAKE ONE TABLET BY MOUTH DAILY, Disp: 90 tablet, Rfl: 2   BD PEN  NEEDLE NANO U/F 32G X 4 MM MISC, USE AS DIRECTED ONCE DAILY, Disp: 100 each, Rfl: 4   Blood Glucose Monitoring Suppl (CONTOUR NEXT MONITOR) w/Device KIT, Use to test blood sugars 4 times daily. Dx: e11.9, Disp: 1 kit, Rfl: 1   budesonide-formoterol (SYMBICORT) 160-4.5 MCG/ACT inhaler, INHALE TWO PUFFS BY MOUTH TWICE A DAY, Disp: 10.2 g, Rfl: 1   cetirizine (ZYRTEC) 10 MG tablet, Take 1 tablet (10 mg total) by mouth daily., Disp: 30 tablet, Rfl: 0   Continuous Blood Gluc Sensor (FREESTYLE LIBRE 2 SENSOR) MISC, TEST BLOOD SUGAR THREE TIMES DAILY AS NEEDED CHANGE SENSOR EVERY 2 WEEKS, Disp: 6 each, Rfl: 3   fluticasone (FLONASE) 50 MCG/ACT nasal spray, Place 1 spray into both nostrils daily., Disp: 16 g, Rfl: 0   glucose blood (CONTOUR NEXT TEST) test strip, Use to test blood sugars 4 times daily. Dx:e11.9, Disp: 400 each, Rfl: 12   hydrochlorothiazide (HYDRODIURIL) 25 MG tablet, TAKE ONE TABLET BY MOUTH DAILY, Disp: 90 tablet, Rfl: 2   JANUMET XR 50-1000 MG TB24, TAKE ONE TABLET BY MOUTH AT BEDTIME, Disp: 90 tablet, Rfl: 1   Lancets 30G MISC, Use to check blood sugars 4 times daily. Dx:e11.9. please let us know if not covered, Disp: 400 each, Rfl: 12   LANTUS SOLOSTAR 100 UNIT/ML Solostar Pen, INJECT 65 UNITS INTO THE SKIN EVERY NIGHT AT BEDTIME, Disp: 15 mL, Rfl: 3   NON FORMULARY, [facility] Peripheral Neuropathy Cream: Bupivacaine 1%; Doxepin 3%; Gabapentin 6%; Pentoxifylline 3%; Topiramate 1%  Faxed over to [facility] - 05/02/19 with 5 refills, Disp: , Rfl:    pravastatin (PRAVACHOL) 20 MG tablet, TAKE 1 TABLET BY MOUTH 4-5 TIMES PER WEEK, Disp: 75 tablet, Rfl: 1  EXAM:  VITALS per patient if applicable:Pulse 76   Temp 98 F (36.7 C)   Ht '5\' 11"'  (1.803 m)   BMI 26.25 kg/m   GENERAL: alert, oriented, appears well and in no acute distress  HEENT: atraumatic, conjunctiva clear, no obvious abnormalities on inspection of external nose and ears  NECK: normal movements of the  head and neck  LUNGS: on inspection no signs of respiratory distress, breathing rate appears normal, no obvious gross SOB, gasping or wheezing.  CV: no obvious cyanosis  MS: moves all visible extremities without noticeable abnormality  PSYCH/NEURO: pleasant and cooperative, no obvious depression, speech and thought processing grossly intact  ASSESSMENT AND PLAN:  Discussed the following assessment and plan: Orders Placed This Encounter  Procedures   Basic metabolic panel   Microalbumin / creatinine urine ratio   VITAMIN D 25 Hydroxy (Vit-D Deficiency, Fractures)   CBC   Hemoglobin A1c    Poorly controlled type II diabetes mellitus with renal complication (HCC) - Plan: Hemoglobin A1c For now continue same dose of Janumet and Lantus.  If renal function does not improve we will need to stop Metformin. Treatment will be adjusted according to HgA1C.  Stage 3 chronic kidney disease, unspecified whether stage 3a or 3b CKD ([place]) - Plan: Basic metabolic panel, Microalbumin / creatinine urine ratio, VITAMIN D 25 Hydroxy (Vit-D Deficiency, Fractures), CBC We discussed lab results. BMP was not repeated as recommended. Stressed the importance of adequate BP and glucose control. Adequate hydration,low salt diet,and avoid NSAID's. BMP to be related in 1-2 weeks, will recommend nephrology referral if numbers are not back to her baseline.  Essential hypertension BP today 164/84. She prefers not to change med doses because before COVID 19 infection BP was better controlled. Continue HCTZ 25 mg daily and Amlodipine 10 mg daily. We could consider adding a ARB if BP still > 135/80. Instructed to let me know about BP readings in 2 weeks. Instructed about warning signs.  COVID-19 virus infection Most symptoms have resolved. Explained that cough and congestion can last a few more weeks. Benzonatate 100 mg 1-2 caps bid reccommended for cough treatment. Adequate hydration. Instructed about warning  signs.  We discussed possible serious and likely etiologies, options for evaluation and workup, limitations of telemedicine visit vs in person visit, treatment, treatment risks and precautions.  I discussed the assessment and treatment plan with the patient. Kimberly [name] was provided an opportunity to ask questions and all were answered. She greed with the plan and demonstrated an understanding of the instructions.  Return in about 4 months (around 04/09/2021) for lab in 2 weeks..   Bentlee Benningfield Martinique, MD

## 2020-12-08 NOTE — Progress Notes (Signed)
LMVM for the patient to contact the office to schedule for labs within 10 - 14 days

## 2020-12-09 ENCOUNTER — Other Ambulatory Visit: Payer: Self-pay | Admitting: Family Medicine

## 2020-12-10 ENCOUNTER — Encounter: Payer: Self-pay | Admitting: Family Medicine

## 2020-12-11 ENCOUNTER — Ambulatory Visit: Payer: Medicare Other | Admitting: Family Medicine

## 2020-12-15 ENCOUNTER — Telehealth: Payer: Self-pay | Admitting: Family Medicine

## 2020-12-16 ENCOUNTER — Ambulatory Visit (HOSPITAL_COMMUNITY)
Admission: EM | Admit: 2020-12-16 | Discharge: 2020-12-16 | Disposition: A | Discharge status: Home / Self Care | Payer: Medicare Other

## 2020-12-16 ENCOUNTER — Other Ambulatory Visit: Payer: Self-pay

## 2020-12-16 ENCOUNTER — Encounter (HOSPITAL_COMMUNITY): Payer: Self-pay

## 2020-12-16 ENCOUNTER — Emergency Department (HOSPITAL_COMMUNITY)
Admission: EM | Admit: 2020-12-16 | Discharge: 2020-12-17 | Disposition: A | Discharge status: Home / Self Care | Payer: Medicare Other | Attending: Emergency Medicine | Admitting: Emergency Medicine

## 2020-12-16 ENCOUNTER — Emergency Department (HOSPITAL_COMMUNITY): Payer: Medicare Other

## 2020-12-16 ENCOUNTER — Encounter (HOSPITAL_COMMUNITY): Payer: Self-pay | Admitting: *Deleted

## 2020-12-16 DIAGNOSIS — Z794 Long term (current) use of insulin: Secondary | ICD-10-CM | POA: Diagnosis not present

## 2020-12-16 DIAGNOSIS — K921 Melena: Secondary | ICD-10-CM | POA: Diagnosis not present

## 2020-12-16 DIAGNOSIS — N200 Calculus of kidney: Secondary | ICD-10-CM | POA: Diagnosis not present

## 2020-12-16 DIAGNOSIS — J45909 Unspecified asthma, uncomplicated: Secondary | ICD-10-CM | POA: Insufficient documentation

## 2020-12-16 DIAGNOSIS — N189 Chronic kidney disease, unspecified: Secondary | ICD-10-CM | POA: Diagnosis not present

## 2020-12-16 DIAGNOSIS — N83201 Unspecified ovarian cyst, right side: Secondary | ICD-10-CM | POA: Diagnosis not present

## 2020-12-16 DIAGNOSIS — R1012 Left upper quadrant pain: Secondary | ICD-10-CM

## 2020-12-16 DIAGNOSIS — E1140 Type 2 diabetes mellitus with diabetic neuropathy, unspecified: Secondary | ICD-10-CM | POA: Insufficient documentation

## 2020-12-16 DIAGNOSIS — R0781 Pleurodynia: Secondary | ICD-10-CM | POA: Diagnosis not present

## 2020-12-16 DIAGNOSIS — N949 Unspecified condition associated with female genital organs and menstrual cycle: Secondary | ICD-10-CM

## 2020-12-16 DIAGNOSIS — E1122 Type 2 diabetes mellitus with diabetic chronic kidney disease: Secondary | ICD-10-CM

## 2020-12-16 DIAGNOSIS — E1169 Type 2 diabetes mellitus with other specified complication: Secondary | ICD-10-CM

## 2020-12-16 DIAGNOSIS — Z79899 Other long term (current) drug therapy: Secondary | ICD-10-CM | POA: Diagnosis not present

## 2020-12-16 DIAGNOSIS — Z7984 Long term (current) use of oral hypoglycemic drugs: Secondary | ICD-10-CM | POA: Diagnosis not present

## 2020-12-16 DIAGNOSIS — R109 Unspecified abdominal pain: Secondary | ICD-10-CM | POA: Diagnosis present

## 2020-12-16 DIAGNOSIS — N3289 Other specified disorders of bladder: Secondary | ICD-10-CM | POA: Diagnosis not present

## 2020-12-16 DIAGNOSIS — N83291 Other ovarian cyst, right side: Secondary | ICD-10-CM | POA: Insufficient documentation

## 2020-12-16 DIAGNOSIS — N183 Chronic kidney disease, stage 3 unspecified: Secondary | ICD-10-CM | POA: Insufficient documentation

## 2020-12-16 DIAGNOSIS — Z7951 Long term (current) use of inhaled steroids: Secondary | ICD-10-CM | POA: Diagnosis not present

## 2020-12-16 DIAGNOSIS — K219 Gastro-esophageal reflux disease without esophagitis: Secondary | ICD-10-CM | POA: Insufficient documentation

## 2020-12-16 DIAGNOSIS — M47816 Spondylosis without myelopathy or radiculopathy, lumbar region: Secondary | ICD-10-CM | POA: Diagnosis not present

## 2020-12-16 DIAGNOSIS — I129 Hypertensive chronic kidney disease with stage 1 through stage 4 chronic kidney disease, or unspecified chronic kidney disease: Secondary | ICD-10-CM | POA: Insufficient documentation

## 2020-12-16 DIAGNOSIS — K8020 Calculus of gallbladder without cholecystitis without obstruction: Secondary | ICD-10-CM | POA: Diagnosis not present

## 2020-12-16 LAB — COMPREHENSIVE METABOLIC PANEL
ALT: 24 U/L (ref 0–44)
AST: 25 U/L (ref 15–41)
Albumin: 3.9 g/dL (ref 3.5–5.0)
Alkaline Phosphatase: 57 U/L (ref 38–126)
Anion gap: 8 (ref 5–15)
BUN: 48 mg/dL — ABNORMAL HIGH (ref 8–23)
CO2: 27 mmol/L (ref 22–32)
Calcium: 8.9 mg/dL (ref 8.9–10.3)
Chloride: 103 mmol/L (ref 98–111)
Creatinine, Ser: 2.05 mg/dL — ABNORMAL HIGH (ref 0.44–1.00)
GFR, Estimated: 26 mL/min — ABNORMAL LOW (ref >60)
Glucose, Bld: 330 mg/dL — ABNORMAL HIGH (ref 70–99)
Potassium: 4.3 mmol/L (ref 3.5–5.1)
Sodium: 138 mmol/L (ref 135–145)
Total Bilirubin: 0.4 mg/dL (ref 0.3–1.2)
Total Protein: 7.2 g/dL (ref 6.5–8.1)

## 2020-12-16 LAB — CBC WITH DIFFERENTIAL/PLATELET
Abs Immature Granulocytes: 0.03 10*3/uL (ref 0.00–0.07)
Basophils Absolute: 0 10*3/uL (ref 0.0–0.1)
Basophils Relative: 0 %
Eosinophils Absolute: 0.2 10*3/uL (ref 0.0–0.5)
Eosinophils Relative: 3 %
HCT: 34.2 % — ABNORMAL LOW (ref 36.0–46.0)
Hemoglobin: 11.2 g/dL — ABNORMAL LOW (ref 12.0–15.0)
Immature Granulocytes: 0 %
Lymphocytes Relative: 24 %
Lymphs Abs: 1.8 10*3/uL (ref 0.7–4.0)
MCH: 28.6 pg (ref 26.0–34.0)
MCHC: 32.7 g/dL (ref 30.0–36.0)
MCV: 87.5 fL (ref 80.0–100.0)
Monocytes Absolute: 0.6 10*3/uL (ref 0.1–1.0)
Monocytes Relative: 9 %
Neutro Abs: 4.9 10*3/uL (ref 1.7–7.7)
Neutrophils Relative %: 64 %
Platelets: 238 10*3/uL (ref 150–400)
RBC: 3.91 MIL/uL (ref 3.87–5.11)
RDW: 13.1 % (ref 11.5–15.5)
WBC: 7.6 10*3/uL (ref 4.0–10.5)
nRBC: 0 % (ref 0.0–0.2)

## 2020-12-16 LAB — URINALYSIS, ROUTINE W REFLEX MICROSCOPIC
Bilirubin Urine: NEGATIVE
Glucose, UA: >=500 mg/dL — AB
Ketones, ur: NEGATIVE mg/dL
Leukocytes,Ua: NEGATIVE
Nitrite: NEGATIVE
Protein, ur: 100 mg/dL — AB
Specific Gravity, Urine: 1.012 (ref 1.005–1.030)
pH: 5 (ref 5.0–8.0)

## 2020-12-16 LAB — LIPASE, BLOOD: Lipase: 60 U/L — ABNORMAL HIGH (ref 11–51)

## 2020-12-16 MED ORDER — ACETAMINOPHEN 500 MG PO TABS
1000.0000 mg | ORAL_TABLET | Freq: Once | ORAL | Status: AC
  Administered 2020-12-16: 1000 mg via ORAL
  Filled 2020-12-16: qty 2

## 2020-12-16 MED ORDER — DICYCLOMINE HCL 10 MG PO CAPS
10.0000 mg | ORAL_CAPSULE | Freq: Once | ORAL | Status: AC
  Administered 2020-12-16: 10 mg via ORAL
  Filled 2020-12-16: qty 1

## 2020-12-16 NOTE — ED Notes (Signed)
Pt ambulated to bathroom with no assistance.  

## 2020-12-16 NOTE — ED Provider Notes (Signed)
Emergency Medicine Provider Triage Evaluation Note  Kimberly [name] 65 y.o. female was evaluated in triage.  Pt complains of left upper quadrant abdominal pain that has been intermittently occurring over the last several months.  She feels like it is worse in the last week.  She states she has had some diarrhea.  She reports this morning, she had episode of black stools.  She went to urgent care and they recommended going to the ED for further evaluation.  She she has been urinating more frequently.  She has a history of diabetes and states that her doctor told her that she is not managing it well.  She has not had any fevers, vomiting, chest pain, difficulty breathing.   Review of Systems  Positive: Abdominal pain, diarrhea, black stool Negative: Fever, vomiting  Physical Exam  BP 134/82   Pulse 70   Temp 98.2 F (36.8 C) (Oral)   Resp 18   Ht 5\' 4"  (1.626 m)   Wt 65.8 kg   SpO2 100%   BMI 24.89 kg/m  Gen:   Awake, no distress   HEENT:  Atraumatic  Resp:  Normal effort  Cardiac:  Normal rate  Abd:   Nondistended, tenderness palpation in the left upper quadrant.  No rigidity, guarding MSK:   Moves extremities without difficulty  Neuro:  Speech clear   Other:      Medical Decision Making  Medically screening exam initiated at 5:16 PM  Appropriate orders placed.  Kimberly [name] was informed that the remainder of the evaluation will be completed by another provider, this initial triage assessment does not replace that evaluation. They are counseled that they will need to remain in the ED until the completion of their workup, including full H&P and results of any tests.  Risks of leaving the emergency department prior to completion of treatment were discussed. Patient was advised to inform ED staff if they are leaving before their treatment is complete. The patient acknowledged these risks and time was allowed for questions.     The patient appears stable so that the remainder of  the MSE may be completed by another provider.    Clinical Impression  Abdominal pain   Portions of this note were generated with Dragon dictation software. Dictation errors may occur despite best attempts at proofreading.     Volanda Napoleon, PA-C 12/16/20 1717    Truddie Hidden, MD 12/16/20 [phone]

## 2020-12-16 NOTE — ED Triage Notes (Addendum)
Pt complains of left sided abdominal pain x 1 year. She reports darker stool this morning.

## 2020-12-16 NOTE — ED Provider Notes (Signed)
[place]    CSN: [id number] Arrival date & time: 12/16/20  1502      History   Chief Complaint Chief Complaint  Patient presents with   Left side pain    HPI Kimberly [name] is a 65 y.o. female presenting with LUQ pain x3 months getting worse and new onset of black stool. Medical history diabetes, hypertension, hyperlipidemia, CKD, peripheral neuropathy, asthma.  States that she has had sharp left upper quadrant pain for 3 months, getting worse.  Now rates the pain as 8/10, sharp and intermittent radiating to her back.  States that her stool has been black for about 3 days, but denies constipation, diarrhea.  Last bowel movement was today and she is still passing gas.  Denies urinary symptoms including dysuria, frequency, urgency, suprapubic pressure, fevers/chills. Pt also notes she took herself off of her janumet one week ago as she is concerned this is impacting her kidneys. Denies chest pain, dizziness, shortness of breath, weakness, headaches, vision changes.  HPI  Past Medical History:  Diagnosis Date   Cataract    OD   Diabetes mellitus 2001   Hyperlipidemia Dx 2012   Hypertension 2001    Patient Active Problem List   Diagnosis Date Noted   CKD (chronic kidney disease) stage 3, GFR 30-59 ml/min (HCC) 10/11/2019   Chronic pain of both knees 10/07/2019   Peripheral neuropathy 06/07/2019   Myalgia due to statin 02/02/2018   Cough variant asthma 10/05/2016   Elevated transaminase level 06/16/2016   Cataract, left eye 10/17/2014   Foot callus 08/21/2014   GERD (gastroesophageal reflux disease) 04/15/2014   Anxiety disorder 04/02/2014   Blurry vision 02/24/2014   Essential hypertension 05/16/2012   Overweight (BMI 25.0-29.9) 05/16/2012   Enlarged thyroid 05/16/2012   Hyperlipidemia 05/16/2012   Poorly controlled type II diabetes mellitus with renal complication ([place]) 03/47/4259    Past Surgical History:  Procedure Laterality Date   ABDOMINAL  HYSTERECTOMY     CATARACT EXTRACTION Left    EYE SURGERY Left    Cat Sx    OB History   No obstetric history on file.      Home Medications    Prior to Admission medications   Medication Sig Start Date End Date Taking? Authorizing Provider  amLODipine (NORVASC) 10 MG tablet TAKE ONE TABLET BY MOUTH DAILY 11/09/20  Yes Skeet Latch, MD  benzonatate (TESSALON) 100 MG capsule Take 2 capsules (200 mg total) by mouth 2 (two) times daily as needed for up to 10 days. 12/08/20 12/18/20 Yes Martinique, Betty G, MD  budesonide-formoterol [facility]) 160-4.5 MCG/ACT inhaler INHALE TWO PUFFS BY MOUTH TWICE A DAY 12/07/20  Yes Martinique, Betty G, MD  fluticasone [facility]) 50 MCG/ACT nasal spray Place 1 spray into both nostrils daily. 06/08/20  Yes Billie Ruddy, MD  hydrochlorothiazide (HYDRODIURIL) 25 MG tablet TAKE ONE TABLET BY MOUTH DAILY 04/17/20  Yes Martinique, Betty G, MD  JANUMET XR 50-1000 MG TB24 TAKE ONE TABLET BY MOUTH AT BEDTIME 08/28/20  Yes Martinique, Betty G, MD  LANTUS SOLOSTAR 100 UNIT/ML Solostar Pen INJECT 65 UNITS INTO THE SKIN EVERY NIGHT AT BEDTIME 10/19/20  Yes Martinique, Betty G, MD  pravastatin (PRAVACHOL) 20 MG tablet TAKE ONE TABLET BY MOUTH THREE TIMES A WEEK 12/10/20  Yes Martinique, Betty G, MD  BD PEN NEEDLE NANO U/F 32G X 4 MM MISC USE AS DIRECTED ONCE DAILY 10/08/19   Martinique, Betty G, MD  Blood Glucose Monitoring Suppl (CONTOUR NEXT MONITOR) w/Device  KIT Use to test blood sugars 4 times daily. Dx: e11.9 05/08/20   Martinique, Betty G, MD  cetirizine (ZYRTEC) 10 MG tablet Take 1 tablet (10 mg total) by mouth daily. 06/08/20   Billie Ruddy, MD  Continuous Blood Gluc Sensor (FREESTYLE LIBRE 2 SENSOR) MISC TEST BLOOD SUGAR THREE TIMES DAILY AS NEEDED CHANGE SENSOR EVERY 2 WEEKS 06/22/20   Martinique, Betty G, MD  glucose blood (CONTOUR NEXT TEST) test strip Use to test blood sugars 4 times daily. Dx:e11.9 05/08/20   Martinique, Betty G, MD  Lancets 30G MISC Use to check blood sugars 4 times daily.  Dx:e11.9. please let us know if not covered 05/08/20   Martinique, Betty G, MD  NON [facility] Peripheral Neuropathy Cream: Bupivacaine 1%; Doxepin 3%; Gabapentin 6%; Pentoxifylline 3%; Topiramate 1%  Faxed over to [facility] - 05/02/19 with 5 refills    Regal, Tamala Fothergill, DPM    Family History Family History  Problem Relation Age of Onset   Depression Mother    Hypertension Mother    Diabetes Mother    Heart disease Father    Heart attack Father 69   Uterine cancer Sister    Lung cancer Brother     Social History Social History   Tobacco Use   Smoking status: Never   Smokeless tobacco: Never  Vaping Use   Vaping Use: Never used  Substance Use Topics   Alcohol use: Yes    Comment: occ   Drug use: No     Allergies   Ace inhibitors   Review of Systems Review of Systems  Constitutional:  Negative for appetite change, chills, diaphoresis, fever and unexpected weight change.  HENT:  Negative for congestion, ear pain, sinus pressure, sinus pain, sneezing, sore throat and trouble swallowing.   Respiratory:  Negative for cough, chest tightness and shortness of breath.   Cardiovascular:  Negative for chest pain.  Gastrointestinal:  Positive for abdominal pain. Negative for abdominal distention, anal bleeding, blood in stool, constipation, diarrhea, nausea, rectal pain and vomiting.  Genitourinary:  Negative for dysuria, flank pain, frequency and urgency.  Musculoskeletal:  Negative for back pain and myalgias.  Neurological:  Negative for dizziness, light-headedness and headaches.  All other systems reviewed and are negative.   Physical Exam Triage Vital Signs ED Triage Vitals [12/16/20 1526]  Enc Vitals Group     BP      Pulse      Resp      Temp      Temp src      SpO2      Weight      Height      Head Circumference      Peak Flow      Pain Score 8     Pain Loc      Pain Edu?      Excl. in [place]?    No data found.  Updated Vital  Signs BP (!) 172/76   Pulse 83   Temp 98.3 F (36.8 C)   Resp 18   SpO2 100%   Visual Acuity Right Eye Distance:   Left Eye Distance:   Bilateral Distance:    Right Eye Near:   Left Eye Near:    Bilateral Near:     Physical Exam Vitals reviewed.  Constitutional:      General: She is not in acute distress.    Appearance: Normal appearance. She is not ill-appearing.  HENT:     Head:  Normocephalic and atraumatic.     Mouth/Throat:     Mouth: Mucous membranes are moist.     Comments: Moist mucous membranes Eyes:     Extraocular Movements: Extraocular movements intact.     Pupils: Pupils are equal, round, and reactive to light.  Cardiovascular:     Rate and Rhythm: Normal rate and regular rhythm.     Heart sounds: Normal heart sounds.  Pulmonary:     Effort: Pulmonary effort is normal.     Breath sounds: Normal breath sounds. No wheezing, rhonchi or rales.  Abdominal:     General: Bowel sounds are normal. There is no distension.     Palpations: Abdomen is soft. There is no mass.     Tenderness: There is abdominal tenderness in the left upper quadrant. There is no right CVA tenderness, left CVA tenderness, guarding or rebound. Negative signs include Murphy's sign, Rovsing's sign and McBurney's sign.     Comments: Significant LUQ pain to palpation and guarding BS full throughout  Skin:    General: Skin is warm.     Capillary Refill: Capillary refill takes less than 2 seconds.     Comments: Good skin turgor  Neurological:     General: No focal deficit present.     Mental Status: She is alert and oriented to person, place, and time.  Psychiatric:        Mood and Affect: Mood normal.        Behavior: Behavior normal.     UC Treatments / Results  Labs (all labs ordered are listed, but only abnormal results are displayed) Labs Reviewed - No data to display   EKG   Radiology No results found.  Procedures Procedures (including critical care time)  Medications  Ordered in UC Medications - No data to display  Initial Impression / Assessment and Plan / UC Course  I have reviewed the triage vital signs and the nursing notes.  Pertinent labs & imaging results that were available during my care of the patient were reviewed by me and considered in my medical decision making (see chart for details).     This patient is a very pleasant 65 y.o. year old female presenting with LUQ pain x3 months getting worse and new onset of black stool. I am concerned for GI bleed. Unsure of last colonoscopy. Sending this pt to ED for further evaluation including likely abdominal imaging.   Pt with CKD and diabetes, she took herself off her janumet 1 week ago.  Final Clinical Impressions(s) / UC Diagnoses   Final diagnoses:  LUQ pain  Black stool  Chronic kidney disease, unspecified CKD stage  Type 2 diabetes mellitus with other specified complication, with long-term current use of insulin [facility])     Discharge Instructions      -Head straight to [facility] or [facility] emergency department for further evaluation and management of your abdominal pain and suspected blood in your stool.  You will likely need imaging of the abdomen that we cannot perform in urgent care, and depending on this you may need urgent intervention.  Head straight to the ER, if your symptoms get worse on the way like dizziness, chest pain, shortness of breath, weakness- stop and call 911 immediately.     ED Prescriptions   None    PDMP not reviewed this encounter.   Hazel Sams, PA-C 12/16/20 1547

## 2020-12-16 NOTE — Discharge Instructions (Addendum)
-  Head straight to [facility] or [facility] emergency department for further evaluation and management of your abdominal pain and suspected blood in your stool.  You will likely need imaging of the abdomen that we cannot perform in urgent care, and depending on this you may need urgent intervention.  Head straight to the ER, if your symptoms get worse on the way like dizziness, chest pain, shortness of breath, weakness- stop and call 911 immediately.

## 2020-12-16 NOTE — ED Notes (Signed)
Patient is being discharged from the Urgent Care and sent to the Emergency Department via POV. Per Marin Roberts PA, patient is in need of higher level of care due to need for further workup of abdominal pain, possible blood in stool. Patient is aware and verbalizes understanding of plan of care.  Vitals:   12/16/20 1531 12/16/20 1532  BP:  (!) 172/76  Pulse: 83   Resp: 18   Temp: 98.3 F (36.8 C)   SpO2: 100%

## 2020-12-16 NOTE — ED Notes (Signed)
Pt ambulated to bathroom. Given urine cup for UA collection

## 2020-12-16 NOTE — ED Triage Notes (Addendum)
Pt c/o left side pain (lower chest/upper abdomen area, under breast) ongoing since last year. Pt states feels like pain is contracting and that it has gotten worse since last month. Pt also reports that she has a chronic cough for years.  Pt reports she stopped taking her Janumet for about one week due to fear it was affecting her kidney.

## 2020-12-17 NOTE — ED Provider Notes (Signed)
[facility] DEPT Provider Note   CSN: [id number] Arrival date & time: 12/16/20  1701     History Chief Complaint  Patient presents with   Abdominal Pain    Kimberly [name] is a 65 y.o. female.  HPI     65 year old female comes in a chief complaint of abdominal pain. Patient has left-sided abdominal pain for the last year.  The pain has no association with p.o. intake.  The pain is described as sharp, and dull.  The pain is primarily below the left ribs.  She denies epigastric nature to the pain and there is no radiation to the flank or back region.  Patient denies any UTI-like symptoms.  She also denies any vomiting, diarrhea.  She does indicate that her stools were little darker but there is no history of bloody stools.  Patient states that she has mentioned this complaint to her PCP, who is advised that she gets better with her diabetes management.  She takes Tylenol for pain control at this time.  Past Medical History:  Diagnosis Date   Cataract    OD   Diabetes mellitus 2001   Hyperlipidemia Dx 2012   Hypertension 2001    Patient Active Problem List   Diagnosis Date Noted   CKD (chronic kidney disease) stage 3, GFR 30-59 ml/min (HCC) 10/11/2019   Chronic pain of both knees 10/07/2019   Peripheral neuropathy 06/07/2019   Myalgia due to statin 02/02/2018   Cough variant asthma 10/05/2016   Elevated transaminase level 06/16/2016   Cataract, left eye 10/17/2014   Foot callus 08/21/2014   GERD (gastroesophageal reflux disease) 04/15/2014   Anxiety disorder 04/02/2014   Blurry vision 02/24/2014   Essential hypertension 05/16/2012   Overweight (BMI 25.0-29.9) 05/16/2012   Enlarged thyroid 05/16/2012   Hyperlipidemia 05/16/2012   Poorly controlled type II diabetes mellitus with renal complication ([place]) 64/33/2951    Past Surgical History:  Procedure Laterality Date   ABDOMINAL HYSTERECTOMY     CATARACT EXTRACTION Left    EYE  SURGERY Left    Cat Sx     OB History   No obstetric history on file.     Family History  Problem Relation Age of Onset   Depression Mother    Hypertension Mother    Diabetes Mother    Heart disease Father    Heart attack Father 38   Uterine cancer Sister    Lung cancer Brother     Social History   Tobacco Use   Smoking status: Never   Smokeless tobacco: Never  Vaping Use   Vaping Use: Never used  Substance Use Topics   Alcohol use: Yes    Comment: occ   Drug use: No    Home Medications Prior to Admission medications   Medication Sig Start Date End Date Taking? Authorizing Provider  amLODipine (NORVASC) 10 MG tablet TAKE ONE TABLET BY MOUTH DAILY 11/09/20   Skeet Latch, MD  BD PEN NEEDLE NANO U/F 32G X 4 MM MISC USE AS DIRECTED ONCE DAILY 10/08/19   Martinique, Betty G, MD  benzonatate (TESSALON) 100 MG capsule Take 2 capsules (200 mg total) by mouth 2 (two) times daily as needed for up to 10 days. 12/08/20 12/18/20  Martinique, Betty G, MD  Blood Glucose Monitoring Suppl (CONTOUR NEXT MONITOR) w/Device KIT Use to test blood sugars 4 times daily. Dx: e11.9 05/08/20   Martinique, Betty G, MD  budesonide-formoterol (SYMBICORT) 160-4.5 MCG/ACT inhaler INHALE TWO PUFFS BY MOUTH  TWICE A DAY 12/07/20   Martinique, Betty G, MD  cetirizine (ZYRTEC) 10 MG tablet Take 1 tablet (10 mg total) by mouth daily. 06/08/20   Billie Ruddy, MD  Continuous Blood Gluc Sensor (FREESTYLE LIBRE 2 SENSOR) MISC TEST BLOOD SUGAR THREE TIMES DAILY AS NEEDED CHANGE SENSOR EVERY 2 WEEKS 06/22/20   Martinique, Betty G, MD  fluticasone [facility]) 50 MCG/ACT nasal spray Place 1 spray into both nostrils daily. 06/08/20   Billie Ruddy, MD  glucose blood (CONTOUR NEXT TEST) test strip Use to test blood sugars 4 times daily. Dx:e11.9 05/08/20   Martinique, Betty G, MD  hydrochlorothiazide (HYDRODIURIL) 25 MG tablet TAKE ONE TABLET BY MOUTH DAILY 04/17/20   Martinique, Betty G, MD  JANUMET XR 50-1000 MG TB24 TAKE ONE TABLET BY  MOUTH AT BEDTIME 08/28/20   Martinique, Betty G, MD  Lancets 30G MISC Use to check blood sugars 4 times daily. Dx:e11.9. please let us know if not covered 05/08/20   Martinique, Betty G, MD  LANTUS SOLOSTAR 100 UNIT/ML Solostar Pen INJECT 65 UNITS INTO THE SKIN EVERY NIGHT AT BEDTIME 10/19/20   Martinique, Betty G, MD  NON [facility] Peripheral Neuropathy Cream: Bupivacaine 1%; Doxepin 3%; Gabapentin 6%; Pentoxifylline 3%; Topiramate 1%  Faxed over to [place] [facility] - 05/02/19 with 5 refills    Wallene Huh, DPM  pravastatin (PRAVACHOL) 20 MG tablet TAKE ONE TABLET BY MOUTH THREE TIMES A WEEK 12/10/20   Martinique, Betty G, MD    Allergies    Ace inhibitors  Review of Systems   Review of Systems  Constitutional:  Positive for activity change.  Gastrointestinal:  Positive for abdominal pain.  All other systems reviewed and are negative.  Physical Exam Updated Vital Signs BP (!) 175/82 (BP Location: Left Arm)   Pulse 92   Temp 97.9 F (36.6 C) (Oral)   Resp 16   SpO2 100%   Physical Exam Vitals and nursing note reviewed.  Constitutional:      Appearance: She is well-developed.  HENT:     Head: Atraumatic.  Cardiovascular:     Rate and Rhythm: Normal rate.  Pulmonary:     Effort: Pulmonary effort is normal.  Abdominal:     Tenderness: There is abdominal tenderness in the left upper quadrant. There is no guarding or rebound.     Hernia: No hernia is present.     Comments: No splenomegaly  Musculoskeletal:     Cervical back: Normal range of motion and neck supple.  Skin:    General: Skin is warm and dry.  Neurological:     Mental Status: She is alert and oriented to person, place, and time.    ED Results / Procedures / Treatments   Labs (all labs ordered are listed, but only abnormal results are displayed) Labs Reviewed  COMPREHENSIVE METABOLIC PANEL - Abnormal; Notable for the following components:      Result Value   Glucose, Bld 330 (*)    BUN 48 (*)     Creatinine, Ser 2.05 (*)    GFR, Estimated 26 (*)    All other components within normal limits  CBC WITH DIFFERENTIAL/PLATELET - Abnormal; Notable for the following components:   Hemoglobin 11.2 (*)    HCT 34.2 (*)    All other components within normal limits  LIPASE, BLOOD - Abnormal; Notable for the following components:   Lipase 60 (*)    All other components within normal limits  URINALYSIS, ROUTINE W REFLEX  MICROSCOPIC - Abnormal; Notable for the following components:   Color, Urine STRAW (*)    Glucose, UA >=500 (*)    Hgb urine dipstick SMALL (*)    Protein, ur 100 (*)    Bacteria, UA RARE (*)    All other components within normal limits    EKG None  Radiology CT ABDOMEN PELVIS WO CONTRAST  Result Date: 12/16/2020 CLINICAL DATA:  Left-sided abdominal pain for 1 year, history of dark stools this morning, initial encounter EXAM: CT ABDOMEN AND PELVIS WITHOUT CONTRAST TECHNIQUE: Multidetector CT imaging of the abdomen and pelvis was performed following the standard protocol without IV contrast. COMPARISON:  None. FINDINGS: Lower chest: No acute abnormality. Hepatobiliary: Multiple small dependent gallstones are noted. Liver is within normal limits. Pancreas: Unremarkable. No pancreatic ductal dilatation or surrounding inflammatory changes. Spleen: Normal in size without focal abnormality. Adrenals/Urinary Tract: Adrenal glands appear within normal limits. Kidneys are well visualized bilaterally. Tiny nonobstructing upper pole stone is noted on left. Ureters are within normal limits bilaterally. Bladder is partially distended. Stomach/Bowel: No obstructive or inflammatory changes are noted in the colon. The appendix is within normal limits. Small bowel and stomach are unremarkable. Vascular/Lymphatic: Aortic atherosclerosis. No enlarged abdominal or pelvic lymph nodes. Reproductive: Status post hysterectomy. Left adnexa appears within normal limits. Right adnexa demonstrates a 5.3 by 2.8  cm simple cyst. Other: No abdominal wall hernia or abnormality. No abdominopelvic ascites. Musculoskeletal: Degenerative changes of lumbar spine are noted. IMPRESSION: Dependent gallstones. Nonobstructing left upper pole renal stone. Right adnexal simple appearing cyst measuring 5.3 cm in greatest dimension. Recommend follow-up US in 6-12 months. Note: This recommendation does not apply to premenarchal patients and to those with increased risk (genetic, family history, elevated tumor markers or other high-risk factors) of ovarian cancer. Reference: JACR 2020 Feb; 17(2):248-254 Electronically Signed   By: Inez Catalina M.D.   On: 12/16/2020 23:20    Procedures Procedures   Medications Ordered in ED Medications  acetaminophen (TYLENOL) tablet 1,000 mg (1,000 mg Oral Given 12/16/20 2244)  dicyclomine (BENTYL) capsule 10 mg (10 mg Oral Given 12/16/20 2245)    ED Course  I have reviewed the triage vital signs and the nursing notes.  Pertinent labs & imaging results that were available during my care of the patient were reviewed by me and considered in my medical decision making (see chart for details).    MDM Rules/Calculators/A&P                          64 year old patient comes in with chief complaint of left-sided abdominal pain. Pain has been present for several months now.  The pain is progressed over time.  She has history of CKD, diabetes.  Ordered a CT scan noncontrast. On exam there is no splenomegaly.  CT scan to ensure there is no evidence of obstruction, mass, stone or splenomegaly.  12:15 AM CT scan is negative for any acute finding on the left side.  Patient advised to follow-up with her PCP for optimal management.  She states that she is trying to get a new PCP and has an appointment already set up in September.  Discussed with her the adnexal abnormality that was discovered on the right side.  Patient has had hysterectomy, informed her that she still needs to see a  gynecologist for this adnexal cyst to ensure there is no concerning finding like tumor.    Final Clinical Impression(s) / ED Diagnoses Final diagnoses:  Adnexal cyst    Rx / DC Orders ED Discharge Orders     None        Varney Biles, MD 12/17/20 [phone]

## 2020-12-17 NOTE — Discharge Instructions (Addendum)
You are seen in the ER for left-sided pain.  The CT scan did not reveal any abnormality on the left side of your abdomen.  We did notice right-sided adnexal cyst.  You will need to see a gynecologist for further evaluation of it.  Additionally, your left-sided symptoms have been ongoing for several weeks now.  It is prudent that you see your primary care doctor for optimal management of the condition.  Finally, you do have advanced kidney disease, make sure that your primary care doctor is managing it closely and you are taking the best care you can of your diabetes.

## 2020-12-18 ENCOUNTER — Ambulatory Visit: Payer: Medicare Other | Admitting: Family Medicine

## 2020-12-22 ENCOUNTER — Other Ambulatory Visit: Payer: Medicare Other

## 2020-12-24 ENCOUNTER — Telehealth: Payer: Self-pay | Admitting: Physician Assistant

## 2020-12-24 NOTE — Telephone Encounter (Signed)
  C/o pain L side under L breast, goes around to her back and her side. Has been going on > 6 months.   Pain and swelling on L ankle and big toe, up and under big toe.   R knee pain >> X ray w/ arthritis  +Urinary frequency.  Hand cramping  Low am blood sugars, < 70 at times.  PCP:  Martinique, Betty G, MD  Advised pt that the only way to evaluate her is to go to the ER. Advised her that is the only way to be sure it is not her heart. However, since she was having these symptoms when she last saw Dr Oval Linsey in 04/2020, the chest pain is unlikely to be cardiac.   Advised that she could have a UTI or some electrolyte problems, needs evaluation for those.   With low am blood sugars, requested she eat a protein snack before bedtime, such as yogurt or string cheese. F/u with  PCP  Rosaria Ferries, PA-C 12/24/2020 2:30 PM

## 2020-12-25 NOTE — Telephone Encounter (Signed)
The patient is needing pain medication for her left foot.  [facility] [id number] [place], [place] Phone:  [phone]  Fax:  [phone]

## 2020-12-29 NOTE — Telephone Encounter (Signed)
Patient would need appointment 

## 2020-12-30 ENCOUNTER — Other Ambulatory Visit: Payer: Self-pay | Admitting: Family Medicine

## 2020-12-30 NOTE — Telephone Encounter (Signed)
December 25, 2020  Skeet Latch, MD to Barrett, Kimberly Croon, PA-C  Me      2:29 PM This does not sound cardiac and we do not provide pain medication.  Recommend that she follow up with urgent care or her primary care provider.   TCR  Advised of Dr Blenda Mounts comments and to call Dr B Martinique for an appointment

## 2020-12-31 DIAGNOSIS — E1322 Other specified diabetes mellitus with diabetic chronic kidney disease: Secondary | ICD-10-CM | POA: Diagnosis not present

## 2020-12-31 DIAGNOSIS — N83201 Unspecified ovarian cyst, right side: Secondary | ICD-10-CM | POA: Diagnosis not present

## 2020-12-31 DIAGNOSIS — R350 Frequency of micturition: Secondary | ICD-10-CM | POA: Diagnosis not present

## 2020-12-31 DIAGNOSIS — R5383 Other fatigue: Secondary | ICD-10-CM | POA: Diagnosis not present

## 2020-12-31 DIAGNOSIS — Z01419 Encounter for gynecological examination (general) (routine) without abnormal findings: Secondary | ICD-10-CM | POA: Diagnosis not present

## 2020-12-31 NOTE — Telephone Encounter (Signed)
Patient is scheduled with Dr. Martinique on 07/08 at 11 AM

## 2021-01-01 ENCOUNTER — Ambulatory Visit (INDEPENDENT_AMBULATORY_CARE_PROVIDER_SITE_OTHER)
Admission: RE | Admit: 2021-01-01 | Discharge: 2021-01-01 | Disposition: A | Discharge status: Home / Self Care | Payer: Medicare Other | Source: Ambulatory Visit | Attending: Family Medicine | Admitting: Family Medicine

## 2021-01-01 ENCOUNTER — Encounter: Payer: Self-pay | Admitting: Family Medicine

## 2021-01-01 ENCOUNTER — Other Ambulatory Visit: Payer: Self-pay

## 2021-01-01 ENCOUNTER — Ambulatory Visit (INDEPENDENT_AMBULATORY_CARE_PROVIDER_SITE_OTHER): Payer: Medicare Other | Admitting: Family Medicine

## 2021-01-01 VITALS — BP 130/70 | HR 91 | Resp 16 | Ht 71.0 in

## 2021-01-01 DIAGNOSIS — M109 Gout, unspecified: Secondary | ICD-10-CM | POA: Diagnosis not present

## 2021-01-01 DIAGNOSIS — E1142 Type 2 diabetes mellitus with diabetic polyneuropathy: Secondary | ICD-10-CM | POA: Diagnosis not present

## 2021-01-01 DIAGNOSIS — M19072 Primary osteoarthritis, left ankle and foot: Secondary | ICD-10-CM | POA: Diagnosis not present

## 2021-01-01 DIAGNOSIS — M79672 Pain in left foot: Secondary | ICD-10-CM

## 2021-01-01 DIAGNOSIS — I10 Essential (primary) hypertension: Secondary | ICD-10-CM

## 2021-01-01 DIAGNOSIS — N184 Chronic kidney disease, stage 4 (severe): Secondary | ICD-10-CM

## 2021-01-01 DIAGNOSIS — E1129 Type 2 diabetes mellitus with other diabetic kidney complication: Secondary | ICD-10-CM | POA: Diagnosis not present

## 2021-01-01 DIAGNOSIS — E1165 Type 2 diabetes mellitus with hyperglycemia: Secondary | ICD-10-CM | POA: Diagnosis not present

## 2021-01-01 LAB — CBC
HCT: 31.8 % — ABNORMAL LOW (ref 36.0–46.0)
Hemoglobin: 10.6 g/dL — ABNORMAL LOW (ref 12.0–15.0)
MCHC: 33.3 g/dL (ref 30.0–36.0)
MCV: 84.1 fl (ref 78.0–100.0)
Platelets: 287 10*3/uL (ref 150.0–400.0)
RBC: 3.78 Mil/uL — ABNORMAL LOW (ref 3.87–5.11)
RDW: 13.4 % (ref 11.5–15.5)
WBC: 7.1 10*3/uL (ref 4.0–10.5)

## 2021-01-01 LAB — MICROALBUMIN / CREATININE URINE RATIO
Creatinine,U: 126.6 mg/dL
Microalb Creat Ratio: 63.7 mg/g — ABNORMAL HIGH (ref 0.0–30.0)
Microalb, Ur: 80.6 mg/dL — ABNORMAL HIGH (ref 0.0–1.9)

## 2021-01-01 LAB — BASIC METABOLIC PANEL
BUN: 53 mg/dL — ABNORMAL HIGH (ref 6–23)
CO2: 28 mEq/L (ref 19–32)
Calcium: 9.9 mg/dL (ref 8.4–10.5)
Chloride: 99 mEq/L (ref 96–112)
Creatinine, Ser: 2.25 mg/dL — ABNORMAL HIGH (ref 0.40–1.20)
GFR: 22.07 mL/min — ABNORMAL LOW (ref >60.00)
Glucose, Bld: 189 mg/dL — ABNORMAL HIGH (ref 70–99)
Potassium: 3.7 mEq/L (ref 3.5–5.1)
Sodium: 139 mEq/L (ref 135–145)

## 2021-01-01 LAB — HEMOGLOBIN A1C: Hgb A1c MFr Bld: 8.7 % — ABNORMAL HIGH (ref 4.6–6.5)

## 2021-01-01 LAB — VITAMIN D 25 HYDROXY (VIT D DEFICIENCY, FRACTURES): VITD: 45.44 ng/mL (ref 30.00–100.00)

## 2021-01-01 LAB — URIC ACID: Uric Acid, Serum: 11.1 mg/dL — ABNORMAL HIGH (ref 2.4–7.0)

## 2021-01-01 MED ORDER — PREDNISONE 20 MG PO TABS: 40.0000 mg | ORAL_TABLET | Freq: Every day | ORAL | 0 refills | Status: AC

## 2021-01-01 MED ORDER — TRAMADOL HCL 50 MG PO TABS: 50.0000 mg | ORAL_TABLET | Freq: Two times a day (BID) | ORAL | 0 refills | Status: AC | PRN

## 2021-01-01 MED ORDER — INSULIN LISPRO (1 UNIT DIAL) 100 UNIT/ML (KWIKPEN)
10.0000 [IU] | PEN_INJECTOR | Freq: Three times a day (TID) | SUBCUTANEOUS | 1 refills | Status: DC
Start: 2021-01-01 — End: 2021-05-31

## 2021-01-01 MED ORDER — METHYLPREDNISOLONE ACETATE 40 MG/ML IJ SUSP
40.0000 mg | Freq: Once | INTRAMUSCULAR | Status: AC
  Administered 2021-01-01: 40 mg via INTRAMUSCULAR

## 2021-01-01 MED ORDER — LANTUS SOLOSTAR 100 UNIT/ML ~~LOC~~ SOPN: 70.0000 [IU] | PEN_INJECTOR | Freq: Every day | SUBCUTANEOUS | 3 refills | Status: DC

## 2021-01-01 NOTE — Patient Instructions (Addendum)
A few things to remember from today's visit:   Foot pain, left - Plan: DG Foot Complete Left, traMADol (ULTRAM) 50 MG tablet, CBC, Uric acid, methylPREDNISolone acetate (DEPO-MEDROL) injection 40 mg  If you need refills please call your pharmacy. Do not use My Chart to request refills or for acute issues that need immediate attention.   Today you received an steroid shot. We may need prednisone, waiting for lab results. Monitor blood sugar closely. It could be gout but we need to evaluate for infection or fracture. Tramadol 50 mg 2 times daily, you can also take Tylenol 500 mg up to 4 tabs daily.  Please be sure medication list is accurate. If a new problem present, please set up appointment sooner than planned today.

## 2021-01-01 NOTE — Assessment & Plan Note (Signed)
BP today adequately controlled. No changes in HCTZ or amlodipine dose.  HCTZ may not be effective for BP controlled given her e GFR and can aggravate hyperuricemia. For now I am not recommending changes but if renal function does not improved or she has recurrent gout attacks, HCTZ will need to be d/c.

## 2021-01-01 NOTE — Progress Notes (Signed)
Chief Complaint  Patient presents with   Foot Pain    Left foot, x 3 weeks, no known injury, swollen today.   HPI: [name] is a 65 y.o. female with history of poorly controlled DM 2, peripheral neuropathy, hypertension, CKD, and chronic cough here today complaining of 3 weeks of sudden onset of r left foot pain and edema of great toe MTP joint. Medial aspect of foot and ankle has been also tender. Pain is constant, severe stubbing pain, exacerbated by light touch, wearing shoe wear,and walking. Alleviated by rest. Slightly better today.  She has not had this problem before. She has had right ankle edema and pain in 09/2019.  No history of trauma. She has taking Tylenol. Negative for fever, chills, change in appetite, CP, palpitation, or dyspnea.  She was last seen on 12/08/20, virtual. She was supposed to have blood work done. DM2: Dx'ed around 2006, she has been on insulin since 2015. Currently she is on Janumet XR 10-998 mg daily and Lantus 65 units daily. She was seen in endocrinology, Dr. Cruzita Lederer, seen in 01/2018. + Feet numbness and tingling.  Negative for abdominal pain, nausea,vomiting, polydipsia,polyuria, or polyphagia.  Lab Results  Component Value Date   HGBA1C 8.1 (H) 07/29/2020   CKD III: Last e GRF was 26, hospital encounter, 07/29/20 e GFR was 27,  01/29/20 it was 37. Cr 1.6 in 01/2020, 2.05 on 12/16/20. + Microalbuminuria. She has not noted gross hematuria,foam in urine,or decreased urine output. For hypertension she is on HCTZ 25 mg daily and amlodipine 10 mg daily.  Lab Results  Component Value Date   CREATININE 2.05 (H) 12/16/2020   BUN 48 (H) 12/16/2020   NA 138 12/16/2020   K 4.3 12/16/2020   CL 103 12/16/2020   CO2 27 12/16/2020   She has already requested TOC, has an appt with new PCP on 03/11/21.  Review of Systems  Constitutional:  Positive for activity change and fatigue.  HENT:  Negative for mouth sores and nosebleeds.   Eyes:   Negative for redness and visual disturbance.  Respiratory:  Positive for cough (No more than usual.). Negative for wheezing.   Gastrointestinal:  Negative for abdominal pain, nausea and vomiting.       Negative for changes in bowel habits.  Skin:  Negative for pallor and rash.  Neurological:  Negative for syncope, weakness and headaches.  Psychiatric/Behavioral:  Negative for confusion.   Rest see pertinent positives and negatives per HPI.  Current Outpatient Medications on File Prior to Visit  Medication Sig Dispense Refill   amLODipine (NORVASC) 10 MG tablet TAKE ONE TABLET BY MOUTH DAILY 90 tablet 2   BD PEN NEEDLE NANO U/F 32G X 4 MM MISC USE AS DIRECTED ONCE DAILY 100 each 4   Blood Glucose Monitoring Suppl (CONTOUR NEXT MONITOR) w/Device KIT Use to test blood sugars 4 times daily. Dx: e11.9 1 kit 1   budesonide-formoterol (SYMBICORT) 160-4.5 MCG/ACT inhaler INHALE TWO PUFFS BY MOUTH TWICE A DAY 10.2 g 1   Continuous Blood Gluc Sensor (FREESTYLE LIBRE 2 SENSOR) MISC TEST BLOOD SUGAR THREE TIMES DAILY AS NEEDED CHANGE SENSOR EVERY 2 WEEKS 6 each 3   fluticasone (FLONASE) 50 MCG/ACT nasal spray Place 1 spray into both nostrils daily. 16 g 0   glucose blood (CONTOUR NEXT TEST) test strip Use to test blood sugars 4 times daily. Dx:e11.9 400 each 12   hydrochlorothiazide (HYDRODIURIL) 25 MG tablet TAKE ONE TABLET BY MOUTH DAILY 90 tablet  2   Lancets 30G MISC Use to check blood sugars 4 times daily. Dx:e11.9. please let us know if not covered 400 each 12   NON [facility] Peripheral Neuropathy Cream: Bupivacaine 1%; Doxepin 3%; Gabapentin 6%; Pentoxifylline 3%; Topiramate 1%  Faxed over to [place] - 05/02/19 with 5 refills     pravastatin (PRAVACHOL) 20 MG tablet TAKE ONE TABLET BY MOUTH THREE TIMES A WEEK 75 tablet 0   cetirizine (ZYRTEC) 10 MG tablet Take 1 tablet (10 mg total) by mouth daily. (Patient not taking: Reported on 01/01/2021) 30 tablet 0   No current  facility-administered medications on file prior to visit.   Past Medical History:  Diagnosis Date   Cataract    OD   Diabetes mellitus 2001   Hyperlipidemia Dx 2012   Hypertension 2001   Allergies  Allergen Reactions   Ace Inhibitors Cough   Social History   Socioeconomic History   Marital status: Divorced    Spouse name: Not on file   Number of children: Not on file   Years of education: Not on file   Highest education level: Not on file  Occupational History   Not on file  Tobacco Use   Smoking status: Never   Smokeless tobacco: Never  Vaping Use   Vaping Use: Never used  Substance and Sexual Activity   Alcohol use: Yes    Comment: occ   Drug use: No   Sexual activity: Not on file  Other Topics Concern   Not on file  Social History Narrative   Not on file   Social Determinants of Health   Financial Resource Strain: Not on file  Food Insecurity: Not on file  Transportation Needs: Not on file  Physical Activity: Not on file  Stress: Not on file  Social Connections: Not on file    Vitals:   01/01/21 1059  BP: 130/70  Pulse: 91  Resp: 16  SpO2: 98%   Body mass index is 26.25 kg/m.  Physical Exam Vitals and nursing note reviewed.  Constitutional:      General: She is not in acute distress.    Appearance: She is well-developed. She is not ill-appearing.  HENT:     Head: Normocephalic and atraumatic.  Eyes:     Conjunctiva/sclera: Conjunctivae normal.  Cardiovascular:     Rate and Rhythm: Normal rate and regular rhythm.     Pulses:          Dorsalis pedis pulses are 2+ on the right side and 2+ on the left side.     Heart sounds: No murmur heard. Pulmonary:     Effort: Pulmonary effort is normal. No respiratory distress.     Breath sounds: Normal breath sounds.  Musculoskeletal:     Left foot: Normal capillary refill. Swelling, bunion, tenderness and bony tenderness present.     Comments: MTP joint of left great toe with edema and erythema.  Erythema on medial aspect of arch.Limitation of ROM.  Skin:    General: Skin is warm.     Findings: No erythema or rash.  Neurological:     Mental Status: She is alert and oriented to person, place, and time.     Comments: Antalgic gait, not assisted.  Psychiatric:        Mood and Affect: Affect is blunt.   ASSESSMENT AND PLAN:  [name] was seen today for foot pain.  Diagnoses and all orders for this visit: Orders Placed This Encounter  Procedures  DG Foot Complete Left   CBC   Uric acid   Ambulatory referral to Nephrology   Lab Results  Component Value Date   WBC 7.1 01/01/2021   HGB 10.6 (L) 01/01/2021   HCT 31.8 (L) 01/01/2021   MCV 84.1 01/01/2021   PLT 287.0 01/01/2021   Lab Results  Component Value Date   LABURIC 11.1 (H) 01/01/2021   Lab Results  Component Value Date   HGBA1C 8.7 (H) 01/01/2021   Lab Results  Component Value Date   CREATININE 2.25 (H) 01/01/2021   BUN 53 (H) 01/01/2021   NA 139 01/01/2021   K 3.7 01/01/2021   CL 99 01/01/2021   CO2 28 01/01/2021   Foot pain, left We discussed possible etiologies. Given her history of poorly controlled DM2, infectious process needs to be considered. Here in the office and after verbal consent she received Depo-Medrol 40 mg IM. Tramadol 50 mg twice daily as needed. She can continue Tylenol 500 mg 3-4 times per day as needed. We discussed side effects of medications. Further recommendations according to lab/imaging result.  -     traMADol (ULTRAM) 50 MG tablet; Take 1 tablet (50 mg total) by mouth every 12 (twelve) hours as needed for up  -     methylPREDNISolone acetate (DEPO-MEDROL) injection 40 mg  Acute gout involving toe of left foot, unspecified cause Elevated uric acid at 11.1. Foot X ray I do not see fractures. Degenerative changes. We discussed side effects of Prednisone ,recommend 40 mg daily with breakfast x 5 days. Low purine diet recommended, handout given.  -     predniSONE  (DELTASONE) 20 MG tablet; Take 2 tablets (40 mg total) by mouth daily with breakfast for 5 days.  Poorly controlled type II diabetes mellitus with renal complication (HCC) Problem has not been well controlled for years. Because renal function, she will be instructed to d/c Janumet. Lantus to be increased from 65 to 70 U and Humalog 10 U with meals will be started. No changes in current management.  Annual eye exam and foot care recommended. F/U in Sep/2022 with new PCP.   CKD (chronic kidney disease), stage IV (HCC) Cr and e GFR progressively getting worse. Nephrology referral placed. BP and glucose must be better controlled. Continue adequate hydration,low salt diet,and avoiding NSAID's.  Essential hypertension BP today adequately controlled. No changes in HCTZ or amlodipine dose.  HCTZ may not be effective for BP controlled given her e GFR and can aggravate hyperuricemia. For now I am not recommending changes but if renal function does not improved or she has recurrent gout attacks, HCTZ will need to be d/c.  Spent 44 minutes  During this time history was obtained and documented, examination was performed, prior and today's labs reviewed, and assessment/plan discussed.  Return if symptoms worsen or fail to improve, for Keep appt with new PCP in 02/2021.Marland Kitchen   Enolia Koepke G. Martinique, MD  [facility]. [place] office.

## 2021-01-01 NOTE — Assessment & Plan Note (Signed)
Problem has not been well controlled for years. Because renal function, she will be instructed to d/c Janumet. Lantus to be increased from 65 to 70 U and Humalog 10 U with meals will be started. No changes in current management.  Annual eye exam and foot care recommended. F/U in Sep/2022 with new PCP.

## 2021-01-01 NOTE — Assessment & Plan Note (Signed)
Cr and e GFR progressively getting worse. Nephrology referral placed. BP and glucose must be better controlled. Continue adequate hydration,low salt diet,and avoiding NSAID's.

## 2021-01-02 ENCOUNTER — Encounter: Payer: Self-pay | Admitting: Family Medicine

## 2021-01-04 DIAGNOSIS — N83209 Unspecified ovarian cyst, unspecified side: Secondary | ICD-10-CM | POA: Diagnosis not present

## 2021-01-06 ENCOUNTER — Ambulatory Visit (HOSPITAL_BASED_OUTPATIENT_CLINIC_OR_DEPARTMENT_OTHER): Payer: Medicare Other | Admitting: Cardiovascular Disease

## 2021-01-06 ENCOUNTER — Ambulatory Visit (HOSPITAL_COMMUNITY)
Admission: RE | Admit: 2021-01-06 | Discharge: 2021-01-06 | Disposition: A | Discharge status: Home / Self Care | Payer: Medicare Other | Source: Ambulatory Visit | Attending: Cardiovascular Disease | Admitting: Cardiovascular Disease

## 2021-01-06 ENCOUNTER — Other Ambulatory Visit: Payer: Self-pay

## 2021-01-06 ENCOUNTER — Encounter (HOSPITAL_BASED_OUTPATIENT_CLINIC_OR_DEPARTMENT_OTHER): Payer: Self-pay | Admitting: Cardiovascular Disease

## 2021-01-06 DIAGNOSIS — R600 Localized edema: Secondary | ICD-10-CM | POA: Diagnosis not present

## 2021-01-06 DIAGNOSIS — I10 Essential (primary) hypertension: Secondary | ICD-10-CM | POA: Diagnosis not present

## 2021-01-06 DIAGNOSIS — M25471 Effusion, right ankle: Secondary | ICD-10-CM | POA: Insufficient documentation

## 2021-01-06 DIAGNOSIS — F419 Anxiety disorder, unspecified: Secondary | ICD-10-CM

## 2021-01-06 DIAGNOSIS — E785 Hyperlipidemia, unspecified: Secondary | ICD-10-CM

## 2021-01-06 HISTORY — DX: Localized edema: R60.0

## 2021-01-06 NOTE — Patient Instructions (Signed)
Medication Instructions:  Your physician recommends that you continue on your current medications as directed. Please refer to the Current Medication list given to you today.   *If you need a refill on your cardiac medications before your next appointment, please call your pharmacy*  Lab Work: FASTING LP/CMET SOON   If you have labs (blood work) drawn today and your tests are completely normal, you will receive your results only by: [place] (if you have MyChart) OR A paper copy in the mail If you have any lab test that is abnormal or we need to change your treatment, we will call you to review the results.  Testing/Procedures: Your physician has requested that you have a lower or upper extremity venous duplex. This test is an ultrasound of the veins in the legs or arms. It looks at venous blood flow that carries blood from the heart to the legs or arms. Allow one hour for a Lower Venous exam. Allow thirty minutes for an Upper Venous exam. There are no restrictions or special instructions. LOWER LEFT ASAP   Follow-Up: At [facility], you and your health needs are our priority.  As part of our continuing mission to provide you with exceptional heart care, we have created designated Provider Care Teams.  These Care Teams include your primary Cardiologist (physician) and Advanced Practice Providers (APPs -  Physician Assistants and Nurse Practitioners) who all work together to provide you with the care you need, when you need it.  We recommend signing up for the patient portal called "MyChart".  Sign up information is provided on this After Visit Summary.  MyChart is used to connect with patients for Virtual Visits (Telemedicine).  Patients are able to view lab/test results, encounter notes, upcoming appointments, etc.  Non-urgent messages can be sent to your provider as well.   To learn more about what you can do with MyChart, go to [email or web address].    Your next appointment:    12 month(s)  The format for your next appointment:   In Person  Provider:   Skeet Latch, MD OR Laurann Montana NP

## 2021-01-06 NOTE — Assessment & Plan Note (Signed)
Kimberly [name] is struggling with stress from all her recent medical issues.  We will have our care guide reach out to her for stress management and coping skills.

## 2021-01-06 NOTE — Assessment & Plan Note (Signed)
Blood pressure well-controlled.  She notes that it has been elevated at home.  I recommended that she bring her blood pressure machine to what ever doctor's visit she goes to next and have them check it.  Continue amlodipine and hydrochlorothiazide for now.

## 2021-01-06 NOTE — Assessment & Plan Note (Signed)
Continue pravastatin.  She will come back for fasting lipids and a CMP.  She has not tolerated other statins in the past.

## 2021-01-06 NOTE — Progress Notes (Signed)
Cardiology Office Note   Date:  01/06/2021   ID:  Kimberly, [name] 12-03-1955, MRN [id number]  PCP:  Martinique, Betty G, MD  Cardiologist:   Skeet Latch, MD   Chief Complaint  Patient presents with   Follow-up    6 months.   Edema    Ankles.      History of Present Illness: Kimberly [name] is a 65 y.o. female with hypertension, hyperlipidemia, GERD, CKD 3, diabetes, and cough-variant asthma here for follow up.  She was initially seen  09/2019 for the evaluation of chest pain shortness of breath.  She saw Dr. Martinique 09/2019 and was noted to have a murmur on exam.  She was referred to cardiology for further evaluation.  She reported some intermittent pain in her left arm that has been occurring for several weeks.  It happens at rest or when laying in the bed and last for a few seconds at a time.  Additionally, she notes some chest tightness that occur separately.  She thinks this is related to stress.  She was referred for an ETT 11/14/2019 that was negative for ischemia.  She achieved 8.8 metabolic equivalents.  Her blood pressure increased to 227/63.  She was started on amlodipine.  She checks her BP at home and it runs around 145.  She is tolerating amlodipine well but doesn't want to add any other medication.   She increased her pravastatin to for 5 days/week, though she does sometimes have to skip it because it causes muscle aches. At her last appointment her lipids were not at goal but she was hesitant to increase her pravastatin. She wanted to focus on diet and exercise.  Today, she has been having pain on her left side, as well as bilateral LE edema. Her pain is ranging from her lower left side/thigh down to her left foot. She has been told this could possibly be gout. Currently, her left UE is also tingling. She went to the ED 12/16/2020 because her symptoms were all occurring on her left side. At home, her blood pressure has been high, near 314 systolic. For exercise, she  occasionally walks and tries to do some exercise at her house. She notes she walks up 2 flights of stairs at home, and does not have any significant symptoms other than some shortness of breath. Of note, a recent CT scan showed a possible ovarian cyst. Her OBGYN performed an [place] in her office, and continues to follow this. Also, around 11/25/2020 she was infected with COVID-19. She denies any chest pain, shortness of breath, palpitations, or exertional symptoms. No headaches, lightheadedness, or syncope to report. Also has no lower extremity edema, orthopnea or PND. She is concerned about her health and is tearful at times.   Past Medical History:  Diagnosis Date   Cataract    OD   Diabetes mellitus 2001   Hyperlipidemia Dx 2012   Hypertension 2001   Lower extremity edema 01/06/2021    Past Surgical History:  Procedure Laterality Date   ABDOMINAL HYSTERECTOMY     CATARACT EXTRACTION Left    EYE SURGERY Left    Cat Sx     Current Outpatient Medications  Medication Sig Dispense Refill   amLODipine (NORVASC) 10 MG tablet TAKE ONE TABLET BY MOUTH DAILY 90 tablet 2   BD PEN NEEDLE NANO U/F 32G X 4 MM MISC USE AS DIRECTED ONCE DAILY 100 each 4   Blood Glucose Monitoring Suppl (CONTOUR NEXT MONITOR) w/Device  KIT Use to test blood sugars 4 times daily. Dx: e11.9 1 kit 1   budesonide-formoterol (SYMBICORT) 160-4.5 MCG/ACT inhaler INHALE TWO PUFFS BY MOUTH TWICE A DAY 10.2 g 1   cetirizine (ZYRTEC) 10 MG tablet Take 1 tablet (10 mg total) by mouth daily. 30 tablet 0   Continuous Blood Gluc Sensor (FREESTYLE LIBRE 2 SENSOR) MISC TEST BLOOD SUGAR THREE TIMES DAILY AS NEEDED CHANGE SENSOR EVERY 2 WEEKS 6 each 3   fluticasone (FLONASE) 50 MCG/ACT nasal spray Place 1 spray into both nostrils daily. 16 g 0   glucose blood (CONTOUR NEXT TEST) test strip Use to test blood sugars 4 times daily. Dx:e11.9 400 each 12   hydrochlorothiazide (HYDRODIURIL) 25 MG tablet TAKE ONE TABLET BY MOUTH DAILY 90 tablet 2    insulin glargine (LANTUS SOLOSTAR) 100 UNIT/ML Solostar Pen Inject 70 Units into the skin daily. 15 mL 3   insulin lispro (HUMALOG KWIKPEN) 100 UNIT/ML KwikPen Inject 10 Units into the skin 3 (three) times daily with meals. 12 mL 1   Lancets 30G MISC Use to check blood sugars 4 times daily. Dx:e11.9. please let us know if not covered 400 each 12   NON FORMULARY [facility] Peripheral Neuropathy Cream: Bupivacaine 1%; Doxepin 3%; Gabapentin 6%; Pentoxifylline 3%; Topiramate 1%  Faxed over to [facility] - 05/02/19 with 5 refills     pravastatin (PRAVACHOL) 20 MG tablet TAKE ONE TABLET BY MOUTH THREE TIMES A WEEK 75 tablet 0   predniSONE (DELTASONE) 20 MG tablet Take 2 tablets (40 mg total) by mouth daily with breakfast for 5 days. 10 tablet 0   traMADol (ULTRAM) 50 MG tablet Take 1 tablet (50 mg total) by mouth every 12 (twelve) hours as needed for up to 7 days. 14 tablet 0   No current facility-administered medications for this visit.    Allergies:   Ace inhibitors    Social History:  The patient  reports that she has never smoked. She has never used smokeless tobacco. She reports current alcohol use. She reports that she does not use drugs.   Family History:  The patient's family history includes Depression in her mother; Diabetes in her mother; Heart attack (age of onset: 22) in her father; Heart disease in her father; Hypertension in her mother; Lung cancer in her brother; Uterine cancer in her sister.    ROS:   Please see the history of present illness. (+) Left side pain, closer to thigh (+) Left LE pain (+) Left UE tingling (+) Tearful (+) Exertional SOB All other systems are reviewed and negative.    PHYSICAL EXAM: VS:  BP 120/62 (BP Location: Left Arm, Patient Position: Sitting, Cuff Size: Large)   Pulse 81   Ht '5\' 11"'  (1.803 m)   Wt 180 lb (81.6 kg)   BMI 25.10 kg/m  , BMI Body mass index is 25.1 kg/m. GENERAL:  Well appearing HEENT: Pupils equal  round and reactive, fundi not visualized, oral mucosa unremarkable NECK:  No jugular venous distention, waveform within normal limits, carotid upstroke brisk and symmetric, no bruits LUNGS:  Clear to auscultation bilaterally HEART:  RRR.  PMI not displaced or sustained,S1 and S2 within normal limits, no S3, no S4, no clicks, no rubs, no murmurs ABD:  Flat, positive bowel sounds normal in frequency in pitch, no bruits, no rebound, no guarding, no midline pulsatile mass, no hepatomegaly, no splenomegaly EXT:  2 plus pulses throughout, Left LE edema, no cyanosis no clubbing.  SKIN:  No  rashes no nodules NEURO:  Cranial nerves II through XII grossly intact, motor grossly intact throughout PSYCH:  Cognitively intact, oriented to person place and time   EKG:   01/06/2021: NSR, 81, inferiorly T wave inversions 05/01/2020: EKG was not ordered. 01/29/2020: EKG was not ordered. 10/25/2019: sinus rhythm.  Rate 96 bpm.  LAFB.  LVH with repolarization abnormalities  ETT 11/14/19: There was no ST segment deviation noted during stress. No evidence of ischemia Hypertensive response to exercise, peak BP 227/63  Recent Labs: 12/16/2020: ALT 24 01/01/2021: BUN 53; Creatinine, Ser 2.25; Hemoglobin 10.6; Platelets 287.0; Potassium 3.7; Sodium 139    Lipid Panel    Component Value Date/Time   CHOL 220 (H) 02/17/2020 0949   TRIG 169 (H) 02/17/2020 0949   HDL 44 (L) 02/17/2020 0949   CHOLHDL 5.0 (H) 02/17/2020 0949   VLDL 52.6 (H) 10/07/2019 1027   LDLCALC 145 (H) 02/17/2020 0949   LDLDIRECT 158.0 10/07/2019 1027      Wt Readings from Last 3 Encounters:  01/06/21 180 lb (81.6 kg)  08/20/20 188 lb 3.2 oz (85.4 kg)  07/29/20 190 lb (86.2 kg)      ASSESSMENT AND PLAN: Lower extremity edema New onset L LE edemaa after a long car ride and COVID-19 infection.  We will get a LE venous Doppler to rule out DVT.   Anxiety disorder Kimberly [name] is struggling with stress from all her recent medical issues.  We  will have our care guide reach out to her for stress management and coping skills.  Essential hypertension Blood pressure well-controlled.  She notes that it has been elevated at home.  I recommended that she bring her blood pressure machine to what ever doctor's visit she goes to next and have them check it.  Continue amlodipine and hydrochlorothiazide for now.  Hyperlipidemia Continue pravastatin.  She will come back for fasting lipids and a CMP.  She has not tolerated other statins in the past.    Current medicines are reviewed at length with the patient today.  The patient does not have concerns regarding medicines.  The following changes have been made:  no change  Labs/ tests ordered today include:   Orders Placed This Encounter  Procedures   Lipid panel   Comprehensive metabolic panel   EKG 18-FQMK   VAS [place] LOWER EXTREMITY VENOUS (DVT)      Disposition:   FU with Camay Pedigo C. Oval Linsey, MD, [facility] in 1 year.   I,Mathew Stumpf,acting as a Education administrator for Skeet Latch, MD.,have documented all relevant documentation on the behalf of Skeet Latch, MD,as directed by  Skeet Latch, MD while in the presence of Skeet Latch, MD.  I, Kimberly Oval Linsey, MD have reviewed all documentation for this visit.  The documentation of the exam, diagnosis, procedures, and orders on 01/06/2021 are all accurate and complete.   Signed, Vidhi Delellis C. Oval Linsey, MD, [facility]  01/06/2021 5:11 PM    [place]

## 2021-01-06 NOTE — Assessment & Plan Note (Signed)
New onset L LE edemaa after a long car ride and COVID-19 infection.  We will get a LE venous Doppler to rule out DVT.

## 2021-01-07 DIAGNOSIS — N8301 Follicular cyst of right ovary: Secondary | ICD-10-CM | POA: Diagnosis not present

## 2021-01-07 DIAGNOSIS — N83209 Unspecified ovarian cyst, unspecified side: Secondary | ICD-10-CM | POA: Diagnosis not present

## 2021-01-07 DIAGNOSIS — N83292 Other ovarian cyst, left side: Secondary | ICD-10-CM | POA: Diagnosis not present

## 2021-01-08 ENCOUNTER — Other Ambulatory Visit: Payer: Self-pay | Admitting: Family Medicine

## 2021-01-08 ENCOUNTER — Encounter: Payer: Self-pay | Admitting: Family Medicine

## 2021-01-08 DIAGNOSIS — I10 Essential (primary) hypertension: Secondary | ICD-10-CM

## 2021-01-13 ENCOUNTER — Other Ambulatory Visit: Payer: Self-pay | Admitting: Nephrology

## 2021-01-13 DIAGNOSIS — R809 Proteinuria, unspecified: Secondary | ICD-10-CM | POA: Diagnosis not present

## 2021-01-13 DIAGNOSIS — N184 Chronic kidney disease, stage 4 (severe): Secondary | ICD-10-CM

## 2021-01-13 DIAGNOSIS — I129 Hypertensive chronic kidney disease with stage 1 through stage 4 chronic kidney disease, or unspecified chronic kidney disease: Secondary | ICD-10-CM | POA: Diagnosis not present

## 2021-01-13 DIAGNOSIS — N189 Chronic kidney disease, unspecified: Secondary | ICD-10-CM | POA: Diagnosis not present

## 2021-01-13 DIAGNOSIS — N2581 Secondary hyperparathyroidism of renal origin: Secondary | ICD-10-CM | POA: Diagnosis not present

## 2021-01-13 DIAGNOSIS — D631 Anemia in chronic kidney disease: Secondary | ICD-10-CM | POA: Diagnosis not present

## 2021-01-14 ENCOUNTER — Ambulatory Visit
Admission: RE | Admit: 2021-01-14 | Discharge: 2021-01-14 | Disposition: A | Discharge status: Home / Self Care | Payer: Medicare Other | Source: Ambulatory Visit | Attending: Nephrology | Admitting: Nephrology

## 2021-01-14 ENCOUNTER — Other Ambulatory Visit: Payer: Self-pay

## 2021-01-14 DIAGNOSIS — N184 Chronic kidney disease, stage 4 (severe): Secondary | ICD-10-CM

## 2021-01-14 DIAGNOSIS — N189 Chronic kidney disease, unspecified: Secondary | ICD-10-CM | POA: Diagnosis not present

## 2021-01-18 ENCOUNTER — Telehealth: Payer: Self-pay | Admitting: Family Medicine

## 2021-01-18 DIAGNOSIS — E1140 Type 2 diabetes mellitus with diabetic neuropathy, unspecified: Secondary | ICD-10-CM

## 2021-01-18 NOTE — Telephone Encounter (Signed)
Referral placed.

## 2021-01-18 NOTE — Telephone Encounter (Signed)
Dr. Arman Filter office needs a referral for patient to be seen there.  Patient is requesting that referral.

## 2021-01-22 ENCOUNTER — Encounter (HOSPITAL_BASED_OUTPATIENT_CLINIC_OR_DEPARTMENT_OTHER): Payer: Self-pay

## 2021-01-25 ENCOUNTER — Telehealth (HOSPITAL_BASED_OUTPATIENT_CLINIC_OR_DEPARTMENT_OTHER): Payer: Self-pay | Admitting: Cardiovascular Disease

## 2021-01-25 NOTE — Telephone Encounter (Signed)
Follow Up:      Pt said she would like for Dr Oval Linsey to give her a call today if possible. She said she had sent a message on My Chart on Friday(01-22-21), had not gotten an answer from it. She said she need to know what to do about her medicine.

## 2021-01-25 NOTE — Telephone Encounter (Signed)
Call to patient, left voicemail message indicating writer would forward her question to Dr Oval Linsey and her nurse.  Explained Dr. Oval Linsey was not in the office this week, but would notify her nurse of request for call back.

## 2021-01-25 NOTE — Telephone Encounter (Signed)
Spoke with pt, she saw dr Oval Linsey 01/06/21 and then went to the kidney doctor 01/12/21 and they want her to start taking losartan. The patient is upset and frustrated because she does not understand why dr Oval Linsey did not start that for her bp. Explained to the patient that losartan is also helpful to the kidneys. She is frustrated with the fact that she has stage 4 kidney disease and her kidneys are only working 32% by her report and she feels like things should have been done sooner to prevent this. She would like a call from dr Oval Linsey and reports she will not start the losartan until she gets that call. She does not know what her bp was at the kidney doctors office, aware it was normal at our appointment. Aware dr Oval Linsey is working in the hospital and no sure how soon she will be able to call her but will make dr Oval Linsey aware.

## 2021-01-28 DIAGNOSIS — I10 Essential (primary) hypertension: Secondary | ICD-10-CM | POA: Diagnosis not present

## 2021-01-28 DIAGNOSIS — N184 Chronic kidney disease, stage 4 (severe): Secondary | ICD-10-CM | POA: Diagnosis not present

## 2021-01-28 DIAGNOSIS — E785 Hyperlipidemia, unspecified: Secondary | ICD-10-CM | POA: Diagnosis not present

## 2021-01-28 LAB — COMPREHENSIVE METABOLIC PANEL
ALT: 15 IU/L (ref 0–32)
AST: 18 IU/L (ref 0–40)
Albumin/Globulin Ratio: 1.5 (ref 1.2–2.2)
Albumin: 4.3 g/dL (ref 3.8–4.8)
Alkaline Phosphatase: 55 IU/L (ref 44–121)
BUN/Creatinine Ratio: 21 (ref 12–28)
BUN: 40 mg/dL — ABNORMAL HIGH (ref 8–27)
Bilirubin Total: <0.2 mg/dL (ref 0.0–1.2)
CO2: 25 mmol/L (ref 20–29)
Calcium: 9.7 mg/dL (ref 8.7–10.3)
Chloride: 101 mmol/L (ref 96–106)
Creatinine, Ser: 1.9 mg/dL — ABNORMAL HIGH (ref 0.57–1.00)
Globulin, Total: 2.9 g/dL (ref 1.5–4.5)
Glucose: 70 mg/dL (ref 65–99)
Potassium: 3.9 mmol/L (ref 3.5–5.2)
Sodium: 142 mmol/L (ref 134–144)
Total Protein: 7.2 g/dL (ref 6.0–8.5)
eGFR: 29 mL/min/{1.73_m2} — ABNORMAL LOW (ref >59)

## 2021-01-28 LAB — LIPID PANEL
Chol/HDL Ratio: 4.7 ratio — ABNORMAL HIGH (ref 0.0–4.4)
Cholesterol, Total: 212 mg/dL — ABNORMAL HIGH (ref 100–199)
HDL: 45 mg/dL (ref >39)
LDL Chol Calc (NIH): 138 mg/dL — ABNORMAL HIGH (ref 0–99)
Triglycerides: 161 mg/dL — ABNORMAL HIGH (ref 0–149)
VLDL Cholesterol Cal: 29 mg/dL (ref 5–40)

## 2021-01-29 ENCOUNTER — Encounter (HOSPITAL_BASED_OUTPATIENT_CLINIC_OR_DEPARTMENT_OTHER): Payer: Self-pay

## 2021-01-29 ENCOUNTER — Telehealth: Payer: Self-pay | Admitting: Cardiovascular Disease

## 2021-01-29 NOTE — Telephone Encounter (Signed)
Epic has been updated.

## 2021-01-29 NOTE — Telephone Encounter (Signed)
error 

## 2021-01-29 NOTE — Telephone Encounter (Signed)
PT is calling to provide Dr. Oval Linsey with her new PCP:   Dr. [facility] [address]) [phone] [phone]

## 2021-02-09 NOTE — Telephone Encounter (Signed)
Thanks Hilda Blades.  I spoke with her.

## 2021-02-09 NOTE — Telephone Encounter (Signed)
Dr Oval Linsey spoke with patient and requested 2 month follow up be scheduled. Patient aware of date, time, and location

## 2021-02-12 DIAGNOSIS — Z794 Long term (current) use of insulin: Secondary | ICD-10-CM | POA: Diagnosis not present

## 2021-02-12 DIAGNOSIS — J45991 Cough variant asthma: Secondary | ICD-10-CM | POA: Diagnosis not present

## 2021-02-12 DIAGNOSIS — E785 Hyperlipidemia, unspecified: Secondary | ICD-10-CM | POA: Diagnosis not present

## 2021-02-12 DIAGNOSIS — E1165 Type 2 diabetes mellitus with hyperglycemia: Secondary | ICD-10-CM | POA: Diagnosis not present

## 2021-02-18 DIAGNOSIS — N184 Chronic kidney disease, stage 4 (severe): Secondary | ICD-10-CM | POA: Diagnosis not present

## 2021-02-24 ENCOUNTER — Other Ambulatory Visit: Payer: Self-pay | Admitting: Family Medicine

## 2021-02-24 DIAGNOSIS — E1129 Type 2 diabetes mellitus with other diabetic kidney complication: Secondary | ICD-10-CM

## 2021-02-24 DIAGNOSIS — E1165 Type 2 diabetes mellitus with hyperglycemia: Secondary | ICD-10-CM

## 2021-03-11 ENCOUNTER — Encounter: Payer: Medicare Other | Admitting: Internal Medicine

## 2021-03-15 DIAGNOSIS — D631 Anemia in chronic kidney disease: Secondary | ICD-10-CM | POA: Diagnosis not present

## 2021-03-15 DIAGNOSIS — N184 Chronic kidney disease, stage 4 (severe): Secondary | ICD-10-CM | POA: Diagnosis not present

## 2021-03-15 DIAGNOSIS — I129 Hypertensive chronic kidney disease with stage 1 through stage 4 chronic kidney disease, or unspecified chronic kidney disease: Secondary | ICD-10-CM | POA: Diagnosis not present

## 2021-03-15 DIAGNOSIS — R809 Proteinuria, unspecified: Secondary | ICD-10-CM | POA: Diagnosis not present

## 2021-03-22 ENCOUNTER — Ambulatory Visit (HOSPITAL_BASED_OUTPATIENT_CLINIC_OR_DEPARTMENT_OTHER): Payer: Medicare Other | Admitting: Cardiovascular Disease

## 2021-03-23 ENCOUNTER — Other Ambulatory Visit: Payer: Self-pay | Admitting: Family Medicine

## 2021-03-23 DIAGNOSIS — Z1231 Encounter for screening mammogram for malignant neoplasm of breast: Secondary | ICD-10-CM

## 2021-03-26 ENCOUNTER — Ambulatory Visit: Payer: Medicare Other | Admitting: Endocrinology

## 2021-03-26 ENCOUNTER — Ambulatory Visit: Payer: Medicare Other

## 2021-04-08 ENCOUNTER — Other Ambulatory Visit: Payer: Self-pay | Admitting: Family Medicine

## 2021-04-12 DIAGNOSIS — N83291 Other ovarian cyst, right side: Secondary | ICD-10-CM | POA: Diagnosis not present

## 2021-04-14 ENCOUNTER — Ambulatory Visit (HOSPITAL_BASED_OUTPATIENT_CLINIC_OR_DEPARTMENT_OTHER): Payer: Medicare Other | Admitting: Cardiovascular Disease

## 2021-04-16 ENCOUNTER — Other Ambulatory Visit: Payer: Self-pay

## 2021-04-16 ENCOUNTER — Ambulatory Visit
Admission: RE | Admit: 2021-04-16 | Discharge: 2021-04-16 | Disposition: A | Discharge status: Home / Self Care | Payer: Medicare Other | Source: Ambulatory Visit | Attending: Family Medicine | Admitting: Family Medicine

## 2021-04-16 DIAGNOSIS — Z1231 Encounter for screening mammogram for malignant neoplasm of breast: Secondary | ICD-10-CM | POA: Diagnosis not present

## 2021-04-19 ENCOUNTER — Encounter: Payer: Medicare Other | Admitting: Internal Medicine

## 2021-05-14 DIAGNOSIS — N7011 Chronic salpingitis: Secondary | ICD-10-CM | POA: Diagnosis not present

## 2021-05-14 DIAGNOSIS — N83292 Other ovarian cyst, left side: Secondary | ICD-10-CM | POA: Diagnosis not present

## 2021-05-31 ENCOUNTER — Ambulatory Visit (HOSPITAL_BASED_OUTPATIENT_CLINIC_OR_DEPARTMENT_OTHER): Payer: Medicare Other | Admitting: Cardiovascular Disease

## 2021-05-31 ENCOUNTER — Other Ambulatory Visit: Payer: Self-pay

## 2021-05-31 ENCOUNTER — Encounter (HOSPITAL_BASED_OUTPATIENT_CLINIC_OR_DEPARTMENT_OTHER): Payer: Self-pay | Admitting: Cardiovascular Disease

## 2021-05-31 DIAGNOSIS — Z01818 Encounter for other preprocedural examination: Secondary | ICD-10-CM

## 2021-05-31 DIAGNOSIS — Z01810 Encounter for preprocedural cardiovascular examination: Secondary | ICD-10-CM

## 2021-05-31 DIAGNOSIS — N184 Chronic kidney disease, stage 4 (severe): Secondary | ICD-10-CM

## 2021-05-31 DIAGNOSIS — E785 Hyperlipidemia, unspecified: Secondary | ICD-10-CM | POA: Diagnosis not present

## 2021-05-31 DIAGNOSIS — I10 Essential (primary) hypertension: Secondary | ICD-10-CM

## 2021-05-31 MED ORDER — LOSARTAN POTASSIUM 50 MG PO TABS: 50.0000 mg | ORAL_TABLET | Freq: Every day | ORAL | 3 refills | Status: DC

## 2021-05-31 MED ORDER — NEXLIZET 180-10 MG PO TABS: 1.0000 | ORAL_TABLET | Freq: Every day | ORAL | 9 refills | Status: DC

## 2021-05-31 MED ORDER — CHLORTHALIDONE 25 MG PO TABS: 25.0000 mg | ORAL_TABLET | Freq: Every day | ORAL | 3 refills | Status: DC

## 2021-05-31 NOTE — Assessment & Plan Note (Signed)
She has atherosclerosis in her aorta.  Her LDL goal is less than 70.  She has not tolerated rosuvastatin.  Her lipids are not at goal on pravastatin and she cannot further titrate due to myalgias.  She would like to come off of her statin.  She is not interested in using an injection.  We will try next visit 1 tablet by mouth daily.  We will get repeat lipids and a CMP in 3 months.

## 2021-05-31 NOTE — Patient Instructions (Signed)
Medication Instructions:  STOP- Hydrochlorothiazide START- Chorthalidone 25 mg by mouth daily INCREASE- Losartan 50 mg by mouth daily STOP- Pravastatin START- Nexlizet 180-10 mg by mouth daily  *If you need a refill on your cardiac medications before your next appointment, please call your pharmacy*   Lab Work: BMP in 1 week  If you have labs (blood work) drawn today and your tests are completely normal, you will receive your results only by: [place] (if you have MyChart) OR A paper copy in the mail If you have any lab test that is abnormal or we need to change your treatment, we will call you to review the results.   Testing/Procedures: None Ordered   Follow-Up: At [facility], you and your health needs are our priority.  As part of our continuing mission to provide you with exceptional heart care, we have created designated Provider Care Teams.  These Care Teams include your primary Cardiologist (physician) and Advanced Practice Providers (APPs -  Physician Assistants and Nurse Practitioners) who all work together to provide you with the care you need, when you need it.  We recommend signing up for the patient portal called "MyChart".  Sign up information is provided on this After Visit Summary.  MyChart is used to connect with patients for Virtual Visits (Telemedicine).  Patients are able to view lab/test results, encounter notes, upcoming appointments, etc.  Non-urgent messages can be sent to your provider as well.   To learn more about what you can do with MyChart, go to [email or web address].    Your next appointment:   2 month(s)  The format for your next appointment:   In Person  Provider:   Skeet Latch, MD

## 2021-05-31 NOTE — Assessment & Plan Note (Signed)
Kimberly [name] exercises regularly.  She can walk several miles and has no exertional chest pain or shortness of breath.  Therefore no further cardiac testing and she is at low risk for her ovarian surgery.

## 2021-05-31 NOTE — Progress Notes (Signed)
Cardiology Office Note   Date:  05/31/2021   ID:  Kimberly [name], Kimberly [name] 1955/08/12, MRN [id number]  PCP:  Janith Lima, MD  Cardiologist:   Skeet Latch, MD   No chief complaint on file.  History of Present Illness: Kimberly [name] is a 65 y.o. female with hypertension, hyperlipidemia, GERD, CKD 3, diabetes, and cough-variant asthma here for follow up.  She was initially seen  09/2019 for the evaluation of chest pain shortness of breath.  She saw Dr. Martinique 09/2019 and was noted to have a murmur on exam.  She was referred to cardiology for further evaluation.  She reported some intermittent pain in her left arm that has been occurring for several weeks.  It happens at rest or when laying in the bed and last for a few seconds at a time.  Additionally, she notes some chest tightness that occur separately.  She thinks this is related to stress.  She was referred for an ETT 11/14/2019 that was negative for ischemia.  She achieved 8.8 metabolic equivalents.  Her blood pressure increased to 227/63.  She was started on amlodipine.  She checks her BP at home and it runs around 145.  She is tolerating amlodipine well but doesn't want to add any other medication.  At her initial visit, she increased her pravastatin to for 5 days/week, though she did sometimes skip it because it caused muscle aches. Her lipids were not at goal but she was hesitant to increase her pravastatin. She wanted to focus on diet and exercise.    At her last visit, she complained of pain on her L side and bilateral LE edema. The pain radiated from her lower left side/thigh down to her left foot. She went to the ED 12/16/2020 because her symptoms all occurred on her left side. CT scan showed a possible ovarian cyst. She was following her OBGYN who performed an [place] in her office. Around 11/25/2020, she was infected with COVID-19. She noted her blood pressures were high at home but controlled in the office . She was asked to bring blood  pressure cuff to follow up. She reported LE swelling but doppler 12/2020 was negative for DVT. She saw her nephrologist and was started on losartan.  Today, she has been doing fine. She has not noticed major swelling in her LE since July. Recently, she reports her blood pressure at home is not high. She has been tolerating her medicine. In the past, she has bilateral leg pain from carvedilol. However, recently, she endorses being constipated despite drinking prune juice and eating dates and wonders if the constipation is related to her medicine changes. She is not interested in a Repatha patch or injections but is open to taking a different tablet for cholesterol. She does not use her inhaler daily.  She brought her blood pressure cuff to clinic. The blood pressure reading recorded on her home cuff is 154/63 which is slightly lower than in clinic. Her R knee pain bothers her but she attributes the pain to arthritis. She stays active by going to the 2nd [facility] of [facility] and walking for about 5000 steps. She also occasionally attends the gym. She is planning to undergo surgery to remove a cyst on her ovary but does not see her primary care physician until the end of January. She denies any palpitations, chest pain, or shortness of breath, lightheadedness, headaches, syncope, orthopnea, PND, lower extremity edema or exertional symptoms.  Past Medical  History:  Diagnosis Date   Cataract    OD   Diabetes mellitus 2001   Hyperlipidemia Dx 2012   Hypertension 2001   Lower extremity edema 01/06/2021    Past Surgical History:  Procedure Laterality Date   ABDOMINAL HYSTERECTOMY     CATARACT EXTRACTION Left    EYE SURGERY Left    Cat Sx     Current Outpatient Medications  Medication Sig Dispense Refill   albuterol (VENTOLIN HFA) 108 (90 Base) MCG/ACT inhaler Inhale 2 puffs into the lungs every 4 (four) hours as needed.     amLODipine (NORVASC) 10 MG tablet TAKE ONE TABLET BY MOUTH  DAILY 90 tablet 2   BD PEN NEEDLE NANO U/F 32G X 4 MM MISC USE AS DIRECTED ONCE DAILY 100 each 4   Bempedoic Acid-Ezetimibe (NEXLIZET) 180-10 MG TABS Take 1 tablet by mouth daily. 30 tablet 9   Blood Glucose Monitoring Suppl (CONTOUR NEXT MONITOR) w/Device KIT Use to test blood sugars 4 times daily. Dx: e11.9 1 kit 1   budesonide-formoterol (SYMBICORT) 160-4.5 MCG/ACT inhaler INHALE TWO PUFFS BY MOUTH TWICE A DAY 10.2 g 1   cetirizine (ZYRTEC) 10 MG tablet Take 1 tablet (10 mg total) by mouth daily. (Patient taking differently: Take 10 mg by mouth daily as needed.) 30 tablet 0   chlorthalidone (HYGROTON) 25 MG tablet Take 1 tablet (25 mg total) by mouth daily. 90 tablet 3   Continuous Blood Gluc Sensor (FREESTYLE LIBRE 2 SENSOR) MISC TEST BLOOD SUGAR THREE TIMES DAILY AS NEEDED CHANGE SENSOR EVERY 2 WEEKS 6 each 3   FARXIGA 10 MG TABS tablet Take 10 mg by mouth daily.     fluticasone (FLONASE) 50 MCG/ACT nasal spray Place 1 spray into both nostrils daily. 16 g 0   gabapentin (NEURONTIN) 100 MG capsule Take 100 mg by mouth daily.     glucose blood (CONTOUR NEXT TEST) test strip Use to test blood sugars 4 times daily. Dx:e11.9 400 each 12   insulin glargine (LANTUS SOLOSTAR) 100 UNIT/ML Solostar Pen Inject 70 Units into the skin daily. 15 mL 3   Lancets 30G MISC Use to check blood sugars 4 times daily. Dx:e11.9. please let us know if not covered 400 each 12   NON FORMULARY [facility] Peripheral Neuropathy Cream: Bupivacaine 1%; Doxepin 3%; Gabapentin 6%; Pentoxifylline 3%; Topiramate 1%  Faxed over to [place] - 05/02/19 with 5 refills     losartan (COZAAR) 50 MG tablet Take 1 tablet (50 mg total) by mouth daily. 90 tablet 3   No current facility-administered medications for this visit.    Allergies:   Ace inhibitors    Social History:  The patient  reports that she has never smoked. She has never used smokeless tobacco. She reports current alcohol use. She reports that she  does not use drugs.   Family History:  The patient's family history includes Depression in her mother; Diabetes in her mother; Heart attack (age of onset: 23) in her father; Heart disease in her father; Hypertension in her mother; Lung cancer in her brother; Uterine cancer in her sister.    ROS:   Please see the history of present illness. (+) Constipation (+) R knee pain All other systems are reviewed and negative.    PHYSICAL EXAM: VS:  BP (!) 162/68 (BP Location: Left Arm, Patient Position: Sitting, Cuff Size: Normal)   Pulse 69   Ht '5\' 11"'  (1.803 m)   Wt 184 lb 9.6 oz (83.7 kg)  BMI 25.75 kg/m  , BMI Body mass index is 25.75 kg/m. GENERAL:  Well appearing HEENT: Pupils equal round and reactive, fundi not visualized, oral mucosa unremarkable NECK:  No jugular venous distention, waveform within normal limits, carotid upstroke brisk and symmetric, no bruits LUNGS:  Clear to auscultation bilaterally HEART:  RRR.  PMI not displaced or sustained,S1 and S2 within normal limits, no S3, no S4, no clicks, no rubs, no murmurs ABD:  Flat, positive bowel sounds normal in frequency in pitch, no bruits, no rebound, no guarding, no midline pulsatile mass, no hepatomegaly, no splenomegaly EXT:  2 plus pulses throughout, no edema, no cyanosis no clubbing.  SKIN:  No rashes no nodules NEURO:  Cranial nerves II through XII grossly intact, motor grossly intact throughout PSYCH:  Cognitively intact, oriented to person place and time   EKG:  EKG was not ordered today 01/06/2021: NSR, 81, inferiorly T wave inversions 10/25/2019: sinus rhythm.  Rate 96 bpm.  LAFB.  LVH with repolarization abnormalities  Lower Venous DVT 01/06/21 RIGHT:  - No evidence of common femoral vein obstruction.  LEFT:  - No evidence of deep vein thrombosis in the lower extremity. No indirect  evidence of obstruction proximal to the inguinal ligament.  - No cystic structure found in the popliteal fossa.   ETT  11/14/19: There was no ST segment deviation noted during stress. No evidence of ischemia Hypertensive response to exercise, peak BP 227/63  Recent Labs: 01/01/2021: Hemoglobin 10.6; Platelets 287.0 01/28/2021: ALT 15; BUN 40; Creatinine, Ser 1.90; Potassium 3.9; Sodium 142    Lipid Panel    Component Value Date/Time   CHOL 212 (H) 01/28/2021 1006   TRIG 161 (H) 01/28/2021 1006   HDL 45 01/28/2021 1006   CHOLHDL 4.7 (H) 01/28/2021 1006   CHOLHDL 5.0 (H) 02/17/2020 0949   VLDL 52.6 (H) 10/07/2019 1027   LDLCALC 138 (H) 01/28/2021 1006   LDLCALC 145 (H) 02/17/2020 0949   LDLDIRECT 158.0 10/07/2019 1027      Wt Readings from Last 3 Encounters:  05/31/21 184 lb 9.6 oz (83.7 kg)  01/06/21 180 lb (81.6 kg)  08/20/20 188 lb 3.2 oz (85.4 kg)      ASSESSMENT AND PLAN: Essential hypertension Blood pressure was poorly controlled both initially and on repeat.  She thinks it was elevated at home as well.  We will switch hydrochlorothiazide to chlorthalidone 25 mg daily.  Increase losartan to 50 mg.  Continue amlodipine.  Check a basic metabolic panel in a week.  She will keep working on regular exercise and diet.  CKD (chronic kidney disease), stage IV ([place]) She follows with her nephrologist annually.  Working on blood pressure control as above.  Continue Farxiga and ARB.  Hyperlipidemia She has atherosclerosis in her aorta.  Her LDL goal is less than 70.  She has not tolerated rosuvastatin.  Her lipids are not at goal on pravastatin and she cannot further titrate due to myalgias.  She would like to come off of her statin.  She is not interested in using an injection.  We will try next visit 1 tablet by mouth daily.  We will get repeat lipids and a CMP in 3 months.  Preoperative evaluation of a medical condition to rule out surgical contraindications (TAR required) Ms. Szymczak exercises regularly.  She can walk several miles and has no exertional chest pain or shortness of breath.  Therefore  no further cardiac testing and she is at low risk for her ovarian surgery.  Current medicines are reviewed at length with the patient today.  The patient does not have concerns regarding medicines.  The following changes have been made:  no change  Labs/ tests ordered today include:   Orders Placed This Encounter  Procedures   Basic Metabolic Panel (BMET)     Disposition:   FU with Kimberly Monds C. Oval Linsey, MD, [facility] in 3-4 months.   I,Mykaella Javier,acting as a scribe for Skeet Latch, MD.,have documented all relevant documentation on the behalf of Skeet Latch, MD,as directed by  Skeet Latch, MD while in the presence of Skeet Latch, MD.  I, [place] Oval Linsey, MD have reviewed all documentation for this visit.  The documentation of the exam, diagnosis, procedures, and orders on 05/31/2021 are all accurate and complete.   Signed, Margarie Mcguirt C. Oval Linsey, MD, [facility]  05/31/2021 12:39 PM    [place]

## 2021-05-31 NOTE — Assessment & Plan Note (Signed)
Blood pressure was poorly controlled both initially and on repeat.  She thinks it was elevated at home as well.  We will switch hydrochlorothiazide to chlorthalidone 25 mg daily.  Increase losartan to 50 mg.  Continue amlodipine.  Check a basic metabolic panel in a week.  She will keep working on regular exercise and diet.

## 2021-05-31 NOTE — Assessment & Plan Note (Signed)
She follows with her nephrologist annually.  Working on blood pressure control as above.  Continue Farxiga and ARB.

## 2021-06-01 ENCOUNTER — Ambulatory Visit (HOSPITAL_BASED_OUTPATIENT_CLINIC_OR_DEPARTMENT_OTHER): Payer: Medicare Other | Admitting: Cardiovascular Disease

## 2021-06-02 ENCOUNTER — Other Ambulatory Visit (HOSPITAL_BASED_OUTPATIENT_CLINIC_OR_DEPARTMENT_OTHER): Payer: Self-pay | Admitting: Cardiovascular Disease

## 2021-06-02 ENCOUNTER — Ambulatory Visit (HOSPITAL_BASED_OUTPATIENT_CLINIC_OR_DEPARTMENT_OTHER): Payer: Medicare Other | Admitting: Cardiovascular Disease

## 2021-06-02 DIAGNOSIS — I10 Essential (primary) hypertension: Secondary | ICD-10-CM

## 2021-06-16 ENCOUNTER — Telehealth (HOSPITAL_BASED_OUTPATIENT_CLINIC_OR_DEPARTMENT_OTHER): Payer: Self-pay

## 2021-06-16 MED ORDER — NEXLIZET 180-10 MG PO TABS: 1.0000 | ORAL_TABLET | Freq: Every day | ORAL | 0 refills | Status: AC

## 2021-06-16 NOTE — Telephone Encounter (Signed)
During sample closet inventory noted count not reconciling for Nexletol nor Nexlizet. Researched and identified pt seen on 05/31/2021.  Per Dr Blenda Mounts note she requested pt be given samples of Nexzilet 180mg /10mg .  When reconciling count noted pt was given Nexletol.  Reviewed findings with Dr Harrell Gave (Dr Oval Linsey not in the office today).  Per Dr Harrell Gave call to patient who states she is tolerating Nexletol, writer explained Dr Oval Linsey would like patient to have Nexlizet because it has an extra ingredient to help with cholesterol.  Reviewed patient's med list with her (per her questions).  She verbalized understanding and will pick up Nexlizet tomorrow. [facility] RN CCM

## 2021-06-16 NOTE — Addendum Note (Signed)
Addended by: Georgana Curio D on: 06/16/2021 04:24 PM   Modules accepted: Orders

## 2021-07-01 ENCOUNTER — Telehealth (HOSPITAL_BASED_OUTPATIENT_CLINIC_OR_DEPARTMENT_OTHER): Payer: Self-pay | Admitting: Cardiovascular Disease

## 2021-07-01 NOTE — Telephone Encounter (Signed)
Received fax from [place] requesting Prior Authorization for Nexlizet 180-10 mg.   Jannine Hollenkamp (Key: [id number])  Rx #: [id number]  Status Sent to Plan today  Drug Nexlizet 180-10MG  tablets  Form [employer] [place] Medicare Part D Electronic Request Form (CB)  Original Claim Info [id number]  Your information has been submitted to [place]. [employer] [place] will review the request and notify you of the determination decision directly, typically within 3 business days of your submission and once all necessary information is received.  You will also receive your request decision electronically. To check for an update later, open the request again from your dashboard.  If [employer] has not responded within the specified timeframe or if you have any questions about your PA submission, contact [place] directly at [facility]) [phone] or ([place]) [phone].

## 2021-07-02 NOTE — Telephone Encounter (Signed)
Jamiah Figuereo Key: [id number] - Rx #: [id number]  Outcome Approved on January 5  Effective from 07/01/2021 through 07/01/2022.  Drug Nexlizet 180-10MG  tablets  Form [employer] [place] Medicare Part D Electronic Request Form (CB) Original Claim Info [id number]

## 2021-07-07 ENCOUNTER — Other Ambulatory Visit: Payer: Self-pay | Admitting: Family Medicine

## 2021-07-13 ENCOUNTER — Telehealth (HOSPITAL_BASED_OUTPATIENT_CLINIC_OR_DEPARTMENT_OTHER): Payer: Self-pay | Admitting: Cardiovascular Disease

## 2021-07-13 NOTE — Telephone Encounter (Signed)
Received fax from [facility] on [facility] requesting refills for Pravastatin 20 mg.  Medication is not currently on pt med list. Per Dr. Oval Linsey at last OV:  Hyperlipidemia She has atherosclerosis in her aorta.  Her LDL goal is less than 70.  She has not tolerated rosuvastatin.  Her lipids are not at goal on pravastatin and she cannot further titrate due to myalgias.  She would like to come off of her statin.  She is not interested in using an injection.  We will try next visit 1 tablet by mouth daily.  We will get repeat lipids and a CMP in 3 months.  --OK to send in a refill? Should pt be taking right now?

## 2021-07-14 NOTE — Telephone Encounter (Signed)
Called pt to ask if she would pick it up or if she would take the Pravastatin. Pt stated she is no longer taking and asked the pharmacy to remove it from their list. She stated she did pick up the Nexlizet the other day and is taking that.   Informed pt I would let Dr. Oval Linsey know. She verbalized thanks.

## 2021-07-22 ENCOUNTER — Encounter: Payer: Self-pay | Admitting: Internal Medicine

## 2021-07-22 ENCOUNTER — Ambulatory Visit (INDEPENDENT_AMBULATORY_CARE_PROVIDER_SITE_OTHER): Payer: Medicare Other | Admitting: Internal Medicine

## 2021-07-22 ENCOUNTER — Other Ambulatory Visit: Payer: Self-pay

## 2021-07-22 VITALS — BP 134/68 | HR 80 | Temp 97.9°F | Ht 71.0 in | Wt 182.0 lb

## 2021-07-22 DIAGNOSIS — D539 Nutritional anemia, unspecified: Secondary | ICD-10-CM | POA: Insufficient documentation

## 2021-07-22 DIAGNOSIS — E1140 Type 2 diabetes mellitus with diabetic neuropathy, unspecified: Secondary | ICD-10-CM

## 2021-07-22 DIAGNOSIS — I10 Essential (primary) hypertension: Secondary | ICD-10-CM

## 2021-07-22 DIAGNOSIS — Z23 Encounter for immunization: Secondary | ICD-10-CM

## 2021-07-22 DIAGNOSIS — E119 Type 2 diabetes mellitus without complications: Secondary | ICD-10-CM

## 2021-07-22 DIAGNOSIS — E1165 Type 2 diabetes mellitus with hyperglycemia: Secondary | ICD-10-CM

## 2021-07-22 DIAGNOSIS — E785 Hyperlipidemia, unspecified: Secondary | ICD-10-CM | POA: Diagnosis not present

## 2021-07-22 DIAGNOSIS — Z794 Long term (current) use of insulin: Secondary | ICD-10-CM

## 2021-07-22 DIAGNOSIS — E118 Type 2 diabetes mellitus with unspecified complications: Secondary | ICD-10-CM | POA: Insufficient documentation

## 2021-07-22 DIAGNOSIS — I129 Hypertensive chronic kidney disease with stage 1 through stage 4 chronic kidney disease, or unspecified chronic kidney disease: Secondary | ICD-10-CM | POA: Diagnosis not present

## 2021-07-22 DIAGNOSIS — E1129 Type 2 diabetes mellitus with other diabetic kidney complication: Secondary | ICD-10-CM

## 2021-07-22 DIAGNOSIS — E1121 Type 2 diabetes mellitus with diabetic nephropathy: Secondary | ICD-10-CM | POA: Diagnosis not present

## 2021-07-22 DIAGNOSIS — Z0001 Encounter for general adult medical examination with abnormal findings: Secondary | ICD-10-CM

## 2021-07-22 DIAGNOSIS — K581 Irritable bowel syndrome with constipation: Secondary | ICD-10-CM

## 2021-07-22 DIAGNOSIS — N184 Chronic kidney disease, stage 4 (severe): Secondary | ICD-10-CM

## 2021-07-22 LAB — CBC WITH DIFFERENTIAL/PLATELET
Basophils Absolute: 0 10*3/uL (ref 0.0–0.1)
Basophils Relative: 0.6 % (ref 0.0–3.0)
Eosinophils Absolute: 0.2 10*3/uL (ref 0.0–0.7)
Eosinophils Relative: 2.9 % (ref 0.0–5.0)
HCT: 36.5 % (ref 36.0–46.0)
Hemoglobin: 12 g/dL (ref 12.0–15.0)
Lymphocytes Relative: 20.1 % (ref 12.0–46.0)
Lymphs Abs: 1.5 10*3/uL (ref 0.7–4.0)
MCHC: 33 g/dL (ref 30.0–36.0)
MCV: 85.9 fl (ref 78.0–100.0)
Monocytes Absolute: 0.6 10*3/uL (ref 0.1–1.0)
Monocytes Relative: 8.1 % (ref 3.0–12.0)
Neutro Abs: 5 10*3/uL (ref 1.4–7.7)
Neutrophils Relative %: 68.3 % (ref 43.0–77.0)
Platelets: 230 10*3/uL (ref 150.0–400.0)
RBC: 4.25 Mil/uL (ref 3.87–5.11)
RDW: 13.3 % (ref 11.5–15.5)
WBC: 7.3 10*3/uL (ref 4.0–10.5)

## 2021-07-22 LAB — BASIC METABOLIC PANEL
BUN: 65 mg/dL — ABNORMAL HIGH (ref 6–23)
CO2: 28 mEq/L (ref 19–32)
Calcium: 10.4 mg/dL (ref 8.4–10.5)
Chloride: 96 mEq/L (ref 96–112)
Creatinine, Ser: 2.63 mg/dL — ABNORMAL HIGH (ref 0.40–1.20)
GFR: 18.23 mL/min — ABNORMAL LOW (ref >60.00)
Glucose, Bld: 205 mg/dL — ABNORMAL HIGH (ref 70–99)
Potassium: 3.8 mEq/L (ref 3.5–5.1)
Sodium: 137 mEq/L (ref 135–145)

## 2021-07-22 LAB — HEPATIC FUNCTION PANEL
ALT: 15 U/L (ref 0–35)
AST: 20 U/L (ref 0–37)
Albumin: 4.7 g/dL (ref 3.5–5.2)
Alkaline Phosphatase: 42 U/L (ref 39–117)
Bilirubin, Direct: 0 mg/dL (ref 0.0–0.3)
Total Bilirubin: 0.4 mg/dL (ref 0.2–1.2)
Total Protein: 8.1 g/dL (ref 6.0–8.3)

## 2021-07-22 LAB — IBC + FERRITIN
Ferritin: 224.5 ng/mL (ref 10.0–291.0)
Iron: 79 ug/dL (ref 42–145)
Saturation Ratios: 17.4 % — ABNORMAL LOW (ref 20.0–50.0)
TIBC: 453.6 ug/dL — ABNORMAL HIGH (ref 250.0–450.0)
Transferrin: 324 mg/dL (ref 212.0–360.0)

## 2021-07-22 LAB — VITAMIN B12: Vitamin B-12: 797 pg/mL (ref 211–911)

## 2021-07-22 LAB — FOLATE: Folate: 10.2 ng/mL (ref >5.9)

## 2021-07-22 LAB — TSH: TSH: 1.88 u[IU]/mL (ref 0.35–5.50)

## 2021-07-22 LAB — HEMOGLOBIN A1C: Hgb A1c MFr Bld: 8.6 % — ABNORMAL HIGH (ref 4.6–6.5)

## 2021-07-22 MED ORDER — FREESTYLE LIBRE 2 READER DEVI: 1.0000 | Freq: Every day | 5 refills | Status: DC

## 2021-07-22 MED ORDER — GVOKE HYPOPEN 2-PACK 1 MG/0.2ML ~~LOC~~ SOAJ: 1.0000 | Freq: Every day | SUBCUTANEOUS | 6 refills | Status: DC | PRN

## 2021-07-22 MED ORDER — FREESTYLE LIBRE 2 SENSOR MISC: 1.0000 | Freq: Every day | 5 refills | Status: DC

## 2021-07-22 MED ORDER — LUBIPROSTONE 8 MCG PO CAPS
8.0000 ug | ORAL_CAPSULE | Freq: Two times a day (BID) | ORAL | 1 refills | Status: DC
Start: 2021-07-22 — End: 2022-03-15

## 2021-07-22 MED ORDER — LANTUS SOLOSTAR 100 UNIT/ML ~~LOC~~ SOPN: 80.0000 [IU] | PEN_INJECTOR | Freq: Every day | SUBCUTANEOUS | 1 refills | Status: DC

## 2021-07-22 MED ORDER — FREESTYLE LIBRE 2 SENSOR MISC: 3 refills | Status: DC

## 2021-07-22 NOTE — Progress Notes (Addendum)
Subjective:  Patient ID: Kimberly [name], female    DOB: [birth year]  Age: 66 y.o. MRN: [id number]  CC: Annual Exam, Hypertension, Diabetes, Hyperlipidemia, Anemia, and Abdominal Pain  This visit occurred during the SARS-CoV-2 public health emergency.  Safety protocols were in place, including screening questions prior to the visit, additional usage of staff PPE, and extensive cleaning of exam room while observing appropriate contact time as indicated for disinfecting solutions.    HPI Kimberly [name] presents for a CPX.  She has had intermittent left upper quadrant pain for greater than 2 years.    She also complains of constipation and hard, dry bowel movements.  She has not gotten much symptom relief with MiraLAX.  She has about 1 bowel movement per week.  She complains that her blood sugar is not well controlled and she has polyuria.  It sounds like she is not been using the insulin.  She has lost 3 pounds over the last 6 weeks.  Outpatient Medications Prior to Visit  Medication Sig Dispense Refill   albuterol (VENTOLIN HFA) 108 (90 Base) MCG/ACT inhaler Inhale 2 puffs into the lungs every 4 (four) hours as needed.     amLODipine (NORVASC) 10 MG tablet TAKE ONE TABLET BY MOUTH DAILY 90 tablet 2   BD PEN NEEDLE NANO U/F 32G X 4 MM MISC USE AS DIRECTED ONCE DAILY 100 each 4   Bempedoic Acid-Ezetimibe (NEXLIZET) 180-10 MG TABS Take 1 tablet by mouth daily. 30 tablet 9   Blood Glucose Monitoring Suppl (CONTOUR NEXT MONITOR) w/Device KIT Use to test blood sugars 4 times daily. Dx: e11.9 1 kit 1   budesonide-formoterol (SYMBICORT) 160-4.5 MCG/ACT inhaler INHALE TWO PUFFS BY MOUTH TWICE A DAY 10.2 g 1   cetirizine (ZYRTEC) 10 MG tablet Take 1 tablet (10 mg total) by mouth daily. (Patient taking differently: Take 10 mg by mouth daily as needed.) 30 tablet 0   chlorthalidone (HYGROTON) 25 MG tablet Take 1 tablet (25 mg total) by mouth daily. 90 tablet 3   FARXIGA 10 MG TABS tablet Take 10 mg by  mouth daily.     fluticasone (FLONASE) 50 MCG/ACT nasal spray Place 1 spray into both nostrils daily. 16 g 0   gabapentin (NEURONTIN) 100 MG capsule Take 100 mg by mouth daily.     glucose blood (CONTOUR NEXT TEST) test strip Use to test blood sugars 4 times daily. Dx:e11.9 400 each 12   Lancets 30G MISC Use to check blood sugars 4 times daily. Dx:e11.9. please let us know if not covered 400 each 12   losartan (COZAAR) 50 MG tablet Take 1 tablet (50 mg total) by mouth daily. 90 tablet 3   NON FORMULARY [facility] Peripheral Neuropathy Cream: Bupivacaine 1%; Doxepin 3%; Gabapentin 6%; Pentoxifylline 3%; Topiramate 1%  Faxed over to [place] - 05/02/19 with 5 refills     Continuous Blood Gluc Sensor (FREESTYLE LIBRE 2 SENSOR) MISC TEST BLOOD SUGAR THREE TIMES DAILY AS NEEDED CHANGE SENSOR EVERY 2 WEEKS 6 each 3   insulin glargine (LANTUS SOLOSTAR) 100 UNIT/ML Solostar Pen Inject 70 Units into the skin daily. 15 mL 3   No facility-administered medications prior to visit.    ROS Review of Systems  Constitutional:  Positive for unexpected weight change. Negative for chills, diaphoresis, fatigue and fever.  HENT: Negative.    Respiratory:  Positive for shortness of breath. Negative for cough, chest tightness and wheezing.   Cardiovascular:  Negative for chest pain, palpitations and  leg swelling.  Gastrointestinal:  Positive for abdominal pain and constipation. Negative for blood in stool, diarrhea, nausea and vomiting.  Endocrine: Positive for polyuria. Negative for polydipsia and polyphagia.  Genitourinary: Negative.  Negative for difficulty urinating, hematuria, pelvic pain and vaginal bleeding.  Musculoskeletal:  Positive for arthralgias. Negative for myalgias.  Skin: Negative.  Negative for pallor.  Neurological: Negative.  Negative for dizziness, weakness, light-headedness, numbness and headaches.  Hematological:  Negative for adenopathy. Does not bruise/bleed easily.   Psychiatric/Behavioral: Negative.     Objective:  BP 134/68 (BP Location: Left Arm, Patient Position: Sitting, Cuff Size: Large)    Pulse 80    Temp 97.9 F (36.6 C) (Oral)    Ht '5\' 11"'  (1.803 m)    Wt 182 lb (82.6 kg)    SpO2 98%    BMI 25.38 kg/m   BP Readings from Last 3 Encounters:  07/22/21 134/68  05/31/21 (!) 162/68  01/06/21 120/62    Wt Readings from Last 3 Encounters:  07/22/21 182 lb (82.6 kg)  05/31/21 184 lb 9.6 oz (83.7 kg)  01/06/21 180 lb (81.6 kg)    Physical Exam Vitals reviewed.  Constitutional:      Appearance: She is not ill-appearing.  HENT:     Nose: Nose normal.     Mouth/Throat:     Mouth: Mucous membranes are moist. Mucous membranes are pale.  Eyes:     General: No scleral icterus.    Conjunctiva/sclera: Conjunctivae normal.  Cardiovascular:     Rate and Rhythm: Normal rate and regular rhythm.     Heart sounds: No murmur heard. Pulmonary:     Effort: Pulmonary effort is normal.     Breath sounds: No stridor. No wheezing, rhonchi or rales.  Abdominal:     General: Abdomen is flat. Bowel sounds are normal. There is no distension.     Palpations: There is no fluid wave, hepatomegaly, splenomegaly or mass.     Tenderness: There is no abdominal tenderness. There is no guarding.     Hernia: No hernia is present.  Musculoskeletal:        General: Deformity (DJD) present. No swelling.     Cervical back: Neck supple. No tenderness.     Right lower leg: No edema.     Left lower leg: No edema.  Lymphadenopathy:     Cervical: No cervical adenopathy.  Skin:    General: Skin is warm and dry.     Coloration: Skin is pale. Skin is not jaundiced.     Findings: No rash.  Neurological:     General: No focal deficit present.     Mental Status: She is alert. Mental status is at baseline.  Psychiatric:        Mood and Affect: Mood normal.        Behavior: Behavior normal.    Lab Results  Component Value Date   WBC 7.3 07/22/2021   HGB 12.0  07/22/2021   HCT 36.5 07/22/2021   PLT 230.0 07/22/2021   GLUCOSE 205 (H) 07/22/2021   CHOL 212 (H) 01/28/2021   TRIG 161 (H) 01/28/2021   HDL 45 01/28/2021   LDLDIRECT 158.0 10/07/2019   LDLCALC 138 (H) 01/28/2021   ALT 15 07/22/2021   AST 20 07/22/2021   NA 137 07/22/2021   K 3.8 07/22/2021   CL 96 07/22/2021   CREATININE 2.63 (H) 07/22/2021   BUN 65 (H) 07/22/2021   CO2 28 07/22/2021   TSH 1.88 07/22/2021  HGBA1C 8.6 (H) 07/22/2021   MICROALBUR 80.6 (H) 01/01/2021    MM 3D SCREEN BREAST BILATERAL  Result Date: 04/21/2021 CLINICAL DATA:  Screening. EXAM: DIGITAL SCREENING BILATERAL MAMMOGRAM WITH TOMOSYNTHESIS AND CAD TECHNIQUE: Bilateral screening digital craniocaudal and mediolateral oblique mammograms were obtained. Bilateral screening digital breast tomosynthesis was performed. The images were evaluated with computer-aided detection. COMPARISON:  Previous exam(s). ACR Breast Density Category b: There are scattered areas of fibroglandular density. FINDINGS: There are no findings suspicious for malignancy. IMPRESSION: No mammographic evidence of malignancy. A result letter of this screening mammogram will be mailed directly to the patient. RECOMMENDATION: Screening mammogram in one year. (Code:SM-B-01Y) BI-RADS CATEGORY  1: Negative. Electronically Signed   By: Ammie Ferrier M.D.   On: 04/21/2021 12:35   IMPRESSION: Dependent gallstones.   Nonobstructing left upper pole renal stone.   Right adnexal simple appearing cyst measuring 5.3 cm in greatest dimension. Recommend follow-up US in 6-12 months. Note: This recommendation does not apply to premenarchal patients and to those with increased risk (genetic, family history, elevated tumor markers or other high-risk factors) of ovarian cancer. Reference: JACR 2020 Feb; 17(2):248-254     Electronically Signed   By: Inez Catalina M.D.   On: 12/16/2020 23:20   Assessment & Plan:   Deion was seen today for annual exam,  hypertension, diabetes, hyperlipidemia, anemia and abdominal pain.  Diagnoses and all orders for this visit:  CKD (chronic kidney disease), stage IV ([place])- Her renal function has declined.  Will try to get better control of her blood sugar.  I recommended that she see nephrology -     Basic metabolic panel; Future -     CBC with Differential/Platelet; Future -     CBC with Differential/Platelet -     Basic metabolic panel -     Ambulatory referral to Nephrology -     Consult to [facility] -     AMB Referral to [place]  Essential hypertension- Her BP is well controlled. -     Basic metabolic panel; Future -     CBC with Differential/Platelet; Future -     TSH; Future -     TSH -     CBC with Differential/Platelet -     Basic metabolic panel  Hyperlipidemia LDL goal <100- I recommended that she take a statin for cardiovascular risk reduction. -     Hepatic function panel; Future -     TSH; Future -     TSH -     Hepatic function panel  Diabetic nephropathy associated with type 2 diabetes mellitus ([place]) -     Basic metabolic panel; Future -     Basic metabolic panel -     AMB Referral to [facility]  Type II diabetes mellitus with complication (HCC) -     Hemoglobin A1c; Future -     HM Diabetes Foot Exam -     Ambulatory referral to Ophthalmology -     Hemoglobin A1c -     AMB Referral to [facility] Care Coordin[facility]  Deficiency anemia- Her H&H are normal now.  I will screen her for vitamin deficiencies. -     Vitamin B12; Future -     IBC + Ferritin; Future -     Vitamin B1; Future -     Zinc; Future -     Folate; Future -     Reticulocytes; Future -     Reticulocytes -  Folate -     Zinc -     Vitamin B1 -     IBC + Ferritin -     Vitamin B12  Encounter for general adult medical examination with abnormal findings- Exam completed, labs reviewed, vaccines reviewed and updated, she has an upcoming appointment for Pap smear,  other cancer screenings are UTD, patient education material was given.  Irritable bowel syndrome with constipation -     lubiprostone (AMITIZA) 8 MCG capsule; Take 1 capsule (8 mcg total) by mouth 2 (two) times daily with a meal.  Poorly controlled type II diabetes mellitus with renal complication (HCC) -     insulin glargine (LANTUS SOLOSTAR) 100 UNIT/ML Solostar Pen; Inject 80 Units into the skin daily. -     Consult to [place] or dependent type II diabetes mellitus (HCC)-we will try to get better control of her blood sugar. -     insulin glargine (LANTUS SOLOSTAR) 100 UNIT/ML Solostar Pen; Inject 80 Units into the skin daily. -     Consult to [facility] -     Glucagon (GVOKE HYPOPEN 2-PACK) 1 MG/0.2ML SOAJ; Inject 1 Act into the skin daily as needed. -     Discontinue: Continuous Blood Gluc Sensor (FREESTYLE LIBRE 2 SENSOR) MISC; TEST BLOOD SUGAR THREE TIMES DAILY AS NEEDED CHANGE SENSOR EVERY 2 WEEKS -     Continuous Blood Gluc Sensor (FREESTYLE LIBRE 2 SENSOR) MISC; 1 Act by Does not apply route daily. -     Continuous Blood Gluc Receiver (FREESTYLE LIBRE 2 READER) DEVI; 1 Act by Does not apply route daily. -     AMB Referral to [place]  Type 2 diabetes mellitus with diabetic neuropathy, with long-term current use of insulin (HCC)  Other orders -     Pneumococcal polysaccharide vaccine 23-valent greater than or equal to 2yo subcutaneous/IM   I have discontinued Laqueisha A. Figiel's FreeStyle [employer]. I have also changed her Lantus SoloStar. Additionally, I am having her start on lubiprostone, Gvoke HypoPen 2-Pack, FreeStyle Libre 2 Sensor, [employer] 2 Reader, and rosuvastatin. Lastly, I am having her maintain her NON FORMULARY, Lancets 30G, Contour Next Monitor, Contour Next Test, cetirizine, fluticasone, budesonide-formoterol, BD Pen Needle Nano U/F, albuterol, Farxiga, gabapentin, losartan, chlorthalidone, Nexlizet,  and amLODipine.  Meds ordered this encounter  Medications   lubiprostone (AMITIZA) 8 MCG capsule    Sig: Take 1 capsule (8 mcg total) by mouth 2 (two) times daily with a meal.    Dispense:  180 capsule    Refill:  1   insulin glargine (LANTUS SOLOSTAR) 100 UNIT/ML Solostar Pen    Sig: Inject 80 Units into the skin daily.    Dispense:  72 mL    Refill:  1   Glucagon (GVOKE HYPOPEN 2-PACK) 1 MG/0.2ML SOAJ    Sig: Inject 1 Act into the skin daily as needed.    Dispense:  2 mL    Refill:  6   DISCONTD: Continuous Blood Gluc Sensor (FREESTYLE LIBRE 2 SENSOR) MISC    Sig: TEST BLOOD SUGAR THREE TIMES DAILY AS NEEDED CHANGE SENSOR EVERY 2 WEEKS    Dispense:  6 each    Refill:  3   Continuous Blood Gluc Sensor (FREESTYLE LIBRE 2 SENSOR) MISC    Sig: 1 Act by Does not apply route daily.    Dispense:  2 each    Refill:  5   Continuous Blood Gluc Receiver (FREESTYLE LIBRE  2 READER) DEVI    Sig: 1 Act by Does not apply route daily.    Dispense:  2 each    Refill:  5   rosuvastatin (CRESTOR) 10 MG tablet    Sig: Take 1 tablet (10 mg total) by mouth daily.    Dispense:  90 tablet    Refill:  1     Follow-up: Return in about 6 months (around 01/19/2022).  Scarlette Calico, MD

## 2021-07-22 NOTE — Patient Instructions (Signed)

## 2021-07-23 ENCOUNTER — Encounter: Payer: Self-pay | Admitting: Internal Medicine

## 2021-07-23 MED ORDER — ROSUVASTATIN CALCIUM 10 MG PO TABS: 10.0000 mg | ORAL_TABLET | Freq: Every day | ORAL | 1 refills | Status: DC

## 2021-07-23 NOTE — Telephone Encounter (Signed)
Pt has been informed of results and expressed understanding.  Pt stated that she is already seeing a kidney specialist Dr. Candiss Norse and will reach out to him to discuss her latest test results. She expressed concern and nervousness of the thought of having to start dialysis.

## 2021-07-25 ENCOUNTER — Other Ambulatory Visit: Payer: Self-pay | Admitting: Internal Medicine

## 2021-07-25 DIAGNOSIS — D538 Other specified nutritional anemias: Secondary | ICD-10-CM | POA: Insufficient documentation

## 2021-07-25 LAB — RETICULOCYTES
ABS Retic: 59220 cells/uL (ref 20000–80000)
Retic Ct Pct: 1.4 %

## 2021-07-25 LAB — ZINC: Zinc: 48 ug/dL — ABNORMAL LOW (ref 60–130)

## 2021-07-25 LAB — VITAMIN B1: Vitamin B1 (Thiamine): 11 nmol/L (ref 8–30)

## 2021-07-25 MED ORDER — ZINC GLUCONATE 50 MG PO TABS: 50.0000 mg | ORAL_TABLET | Freq: Every day | ORAL | 1 refills | Status: DC

## 2021-07-26 ENCOUNTER — Telehealth: Payer: Self-pay | Admitting: Internal Medicine

## 2021-07-26 ENCOUNTER — Telehealth: Payer: Self-pay | Admitting: *Deleted

## 2021-07-26 NOTE — Chronic Care Management (AMB) (Signed)
°  Chronic Care Management   Note  07/26/2021 Name: Kimberly [name] MRN: [id number] DOB: [birth year]  [name] is a 66 y.o. year old female who is a primary care patient of Janith Lima, MD. I reached out to Charlann Noss by phone today in response to a referral sent by Ms. Ashby Dawes PCP, Janith Lima, MD.   Ms. Kitchen was given information about Chronic Care Management services today including:  CCM service includes personalized support from designated clinical staff supervised by her physician, including individualized plan of care and coordination with other care providers 24/7 contact phone numbers for assistance for urgent and routine care needs. Service will only be billed when office clinical staff spend 20 minutes or more in a month to coordinate care. Only one practitioner may furnish and bill the service in a calendar month. The patient may stop CCM services at any time (effective at the end of the month) by phone call to the office staff.   Patient agreed to services and verbal consent obtained.   Follow up plan:   Tatjana Secretary/administrator

## 2021-07-26 NOTE — Chronic Care Management (AMB) (Signed)
Chronic Care Management   Note  07/26/2021 Name: Murrell Elizondo MRN: [id number] DOB: 01/25/1956  Celene Squibb Dedic is a 66 y.o. year old female who is a primary care patient of Janith Lima, MD. I reached out to Charlann Noss by phone today in response to a referral sent by Ms. Ashby Dawes PCP.  Ms. Borkowski was given information about Chronic Care Management services today including:  CCM service includes personalized support from designated clinical staff supervised by her physician, including individualized plan of care and coordination with other care providers 24/7 contact phone numbers for assistance for urgent and routine care needs. Service will only be billed when office clinical staff spend 20 minutes or more in a month to coordinate care. Only one practitioner may furnish and bill the service in a calendar month. The patient may stop CCM services at any time (effective at the end of the month) by phone call to the office staff. The patient is responsible for co-pay (up to 20% after annual deductible is met) if co-pay is required by the individual health plan.   Patient agreed to services and verbal consent obtained.   Follow up plan: Telephone appointment with care management team member scheduled VEL:FYBO on 07/27/21 and SW on 07/28/21  [facility]  Direct Dial: [phone]

## 2021-07-27 ENCOUNTER — Ambulatory Visit (INDEPENDENT_AMBULATORY_CARE_PROVIDER_SITE_OTHER): Payer: Medicare Other | Admitting: *Deleted

## 2021-07-27 DIAGNOSIS — E1169 Type 2 diabetes mellitus with other specified complication: Secondary | ICD-10-CM | POA: Diagnosis not present

## 2021-07-27 DIAGNOSIS — N184 Chronic kidney disease, stage 4 (severe): Secondary | ICD-10-CM

## 2021-07-27 DIAGNOSIS — I10 Essential (primary) hypertension: Secondary | ICD-10-CM | POA: Diagnosis not present

## 2021-07-27 DIAGNOSIS — Z794 Long term (current) use of insulin: Secondary | ICD-10-CM

## 2021-07-27 DIAGNOSIS — E118 Type 2 diabetes mellitus with unspecified complications: Secondary | ICD-10-CM

## 2021-07-27 NOTE — Patient Instructions (Signed)
Visit Information   Kimberly [name], thank you for taking time to talk with me today. Please don't hesitate to contact me if I can be of assistance to you before our next scheduled telephone appointment.  Below are the goals we discussed today:  Patient Self-Care Activities: Patient Kimberly [name] will: Take medications as prescribed Attend all scheduled provider appointments Call pharmacy for medication refills Call provider office for new concerns or questions Continue to check fasting (first thing in the morning, before eating) and then again 2-hours AFTER a regular size meal blood sugars at home- please write these values down on paper so we can review them together during our future phone call appointments Continue to follow heart healthy, low salt, low cholesterol, carbohydrate-modified, low sugar diet Stay in touch with the Clinical Social Worker and the Pharmacist at Dr. Ronnald Ramp' office Review enclosed educational material   Our next scheduled telephone follow up visit/ appointment with care management team member is scheduled on: Tuesday, August 24, 2021 at 11:30 am- This is a PHONE [place] appointment  If you need to cancel or re-schedule our visit, please call [phone] and our care guide team will be happy to assist you.   I look forward to hearing about your progress.   Oneta Rack, RN, BSN, [place] [phone]: direct office  If you are experiencing a Mental Health or [place] or need someone to talk to, please  call the Suicide and Crisis Lifeline: 988 call the [place] National Suicide Prevention Lifeline: 380 444 9618 or TTY: 949-880-3414 TTY 313-672-1268) to talk to a trained counselor call 1-800-273-TALK (toll free, 24 hour hotline) go to [facility] [address] [place] [phone]) call 1   Living With Diabetes Diabetes (type 1 diabetes mellitus or  type 2 diabetes mellitus) is a condition in which the body does not have enough of a hormone called insulin, or the body does not respond properly to insulin. Normally, insulin allows sugars (glucose) to enter cells in the body. With diabetes, extra glucose builds up in the blood instead of going into cells. This results in high blood glucose (hyperglycemia). How to manage lifestyle changes Managing diabetes includes medical treatments as well as lifestyle changes. If diabetes is not managed well, serious physical and emotional complications can occur. Taking good care of yourself means that you are responsible for: Monitoring glucose regularly. Eating a healthy diet. Exercising regularly. Meeting with health care providers. Taking medicines as directed. Most people feel some stress about managing their diabetes. When this stress becomes too much, it is known as diabetes-related distress. This is very common. Living with diabetes can place you at risk for diabetes distress, depression, or anxiety. These disorders can make diabetes more difficult to manage. How to recognize stress You may have diabetes distress if you: Avoid or ignore your daily diabetes care. This includes glucose testing, following a meal plan, and taking medications. Feel overwhelmed by your daily diabetes care. Experience emotional reactions such as anger, sadness, or fear related to your daily diabetes care. Feel fear or shame about not doing everything perfectly that you have been told to do. Emotional distress Symptoms of diabetes distress include: Anger about having a diagnosis of diabetes. Fear or frustration about your diagnosis and the changes you need to make to manage the condition. Being overly worried about the care that you need or the cost of the care that you need. Feeling like you caused your  condition by doing something wrong. Fear about unpredictable fluctuations in your blood glucose, like low or high blood  glucose. Feeling judged by your health care providers. Feeling very alone with the disease. Depression Having diabetes means that you are at a higher risk for depression. Your health care provider may test (screen) you for symptoms of depression. It is important to recognize symptoms and to start treatment for depression soon after it is diagnosed. The following are some symptoms of depression: Loss of interest in things that you used to enjoy. Feeling depressed much or most of the time. A change in appetite. Trouble getting to sleep or staying asleep. Feeling tired most of the day. Feeling nervous and anxious. Feeling guilty and worrying that you are a burden to others. Having thoughts of hurting yourself or feeling that you want to die. If you have any of these symptoms, more days than not, for 2 weeks or longer, you may have depression. This would be a good time to contact your health care provider. Follow these instructions at home: Managing diabetes distress The following are some ways to manage emotional distress: Learn as much as you can about diabetes and its treatment. Take one step at a time to improve your management. Meet with a certified diabetes care and education specialist. Take a class to learn how to manage your condition. Consider working with a counselor or therapist. Keep a journal of your thoughts and concerns. Accept that some things are out of your control. Talk with other people who have diabetes. It can help to talk about the distress that you feel. Find ways to manage stress that work for you. These may include art or music therapy, exercise, meditation, and hobbies. Seek support from spiritual leaders, family, and friends.  General instructions Do your best to follow your diabetes management plan. If you are struggling to follow your plan, talk with a certified diabetes care and education specialist, or with someone else who has diabetes. They may have ideas  that will help. Forgive yourself for not being perfect. Almost everyone struggles with the tasks of diabetes. Keep all follow-up visits. This is important. Where to find support Search for information and support from the American Diabetes Association: www.diabetes.org Find a certified diabetes education and care specialist. Make an appointment through the Association of Diabetes Care & Education Specialists: www.diabeteseducator.org Contact a health care provider if: You believe your diabetes is getting out of control. You are concerned you may be depressed. You think your medications are not helping control your diabetes. You are feeling overwhelmed with your diabetes. Get help right away if: You have thoughts about hurting yourself or others. If you ever feel like you may hurt yourself or others, or have thoughts about taking your own life, get help right away. You can go to your nearest emergency department or call: Your local emergency services (911 in the [place] A suicide crisis helpline, such as the [place] at (339)773-3844 or 988 in the [place]. This is open 24 hours a day. Summary Diabetes (type 1 diabetes mellitus or type 2 diabetes mellitus) is a condition in which the body does not have enough of a hormone called insulin, or the body does not respond properly to insulin. Living with diabetes puts you at risk for medical and emotional issues, such as diabetes distress, depression, and anxiety. Recognizing the symptoms of diabetes distress and depression may help you avoid problems with your diabetes control. If you experience symptoms, it is  important to discuss this with your health care provider, certified diabetes care and education specialist, or therapist. It is important to start treatment for diabetes distress and depression soon after diagnosis. Ask your health care provider to recommend a therapist who understands both depression and diabetes. This  information is not intended to replace advice given to you by your health care provider. Make sure you discuss any questions you have with your health care provider. Document Revised: 01/06/2021 Document Reviewed: 10/24/2019 Elsevier Patient Education  2022 [employer].   Following is a copy of your full care plan:  Care Plan : RN Care Manager Plan of Care  Updates made by Knox Royalty, RN since 07/27/2021 12:00 AM     Problem: Chronic Disease Management Needs   Priority: High     Long-Range Goal: Development of plan of care for long term chronic disease management   Start Date: 07/27/2021  Expected End Date: 07/27/2022  Priority: High  Note:   Current Barriers:  Chronic Disease Management support and education needs related to HTN and DMII Stress around recent worsening of kidney function Questions about medications: CCM Pharmacy team active/ involved Social isolation concerns: since her vehicle has been in the shop, she is not getting out to attend church very often, and is not getting out to get activity like she used to; (normally goes to gym near her home as well as walks regularly around the [employer] where she has a Higher education careers adviser): discussed/ collaborated overall case with CCM CSW, who is active on care team and scheduled to contact patient tomorrow morning; she is hoping to get her vehicle back "soon"  RNCM Clinical Goal(s):  Patient will demonstrate ongoing health management independence as evidenced by adherence to plan of care for DMII/ HTN/ HLD        through collaboration with RN Care manager, provider, and care team.   Interventions: 1:1 collaboration with primary care provider regarding development and update of comprehensive plan of care as evidenced by provider attestation and co-signature Inter-disciplinary care team collaboration (see longitudinal plan of care) Evaluation of current treatment plan related to  self management and patient's adherence to  plan as established by provider 07/27/21:  Initial Assessment completed Reviewed recent PCP office visit 07/22/21: she verbalizes a good general understanding of post-visit instructions Pain Assessment completed: denies acute pain today; reports intermittent chronic pain of (L) flank of unknown cause- reports this pain spontaneously subsides without intervention; going to have gynecology evaluation- thinks may be related to ovarian cyst Falls assessment completed: denies falls x 12 months- does not use assistive devices; states "I just take my time and go slow;" fall risk/ prevention education provided/ discussed Medication review completed: patient has many good questions about her medications: we went through each one today using her AVS from PCP Office visit 07/22/21-- I encouraged her to write down on this paper each of the medications she is/ is not currently taking, as well as her questions: she has in-person office visit scheduled with CCM Pharmacist 08/13/21-- encouraged  her to bring her list of medications and question to this visit- she states she will do; medication list updated according to patient report Reviewed upcomming provider office visits with patient and confirmed she is aware of/ has plans to attend all as scheduled 07/28/21- CCM CSW phone appointment 08/02/21- Dr. Candiss Norse, renal 08/13/21- [place] in-person office visit 08/20/21- cardiology provider, Dr. Oval Linsey "Sometimes in February"- annual vision/ eye appointment  Diabetes:  (Status: New goal.)  Long Term Goal   Lab Results  Component Value Date   HGBA1C 8.6 (H) 07/22/2021  Assessed patient's understanding of A1c goal: <7% Provided education to patient about basic DM disease process; Reviewed medications with patient and discussed importance of medication adherence;        Reviewed prescribed diet with patient heart healthy, low salt, low cholesterol, carbohydrate modified/ low sugar; Counseled on importance of regular  laboratory monitoring as prescribed;        Discussed plans with patient for ongoing care management follow up and provided patient with direct contact information for care management team;      Review of patient status, including review of consultants reports, relevant laboratory and other test results, and medications completed;       Confirmed patient monitors/ records blood sugars BID: fasting and evening/ 2 hours after eating Reviewed recent blood sugars at home: she reports yesterday fasting of "217;" but tells me today she woke up this morning after not sleeping well with a low blood sugar reading and "had to eat right away" due to "feeling terrible;" after eating,, checked blood sugar and it was "102;" in general, patient reports vast array of blood sugars throughout each day Assessed patient's understanding of meaning/ significance of A1-C values: good baseline understanding of same- Reviewed individual historical A1-C trends and provided education around correlation of A1-C value to blood sugar levels at home over 3 months-- she is very worried about her kidney function and I encourages patient as we reviewed her trends: she has made good progress in decreasing A1-C levels over last 2 years: provided encouragement that she can decrease her blood sugars even more as we work together Discussed long term complications/ risks of having DMII- high blood sugars  Hypertension: (Status: New goal.) Long Term Goal  Last practice recorded BP readings:  BP Readings from Last 3 Encounters:  07/22/21 134/68  05/31/21 (!) 162/68  01/06/21 120/62  Most recent eGFR/CrCl:  Lab Results  Component Value Date   EGFR 29 (L) 01/28/2021    No components found for: CRCL  Evaluation of current treatment plan related to hypertension self management and patient's adherence to plan as established by provider;   Reviewed medications with patient and discussed importance of compliance;  Counseled on the importance  of exercise goals with target of 150 minutes per week Discussed plans with patient for ongoing care management follow up and provided patient with direct contact information for care management team; Confirmed patient monitors/ records blood pressures at home several times each week: today she reports recently recalled blood pressure of "127/67"  Patient Goals/Self-Care Activities: As evidenced by review of EHR, collaboration with care team, and patient reporting during CCM RN CM outreach,  Patient Kimberly [name] will: Take medications as prescribed Attend all scheduled provider appointments Call pharmacy for medication refills Call provider office for new concerns or questions Continue to check fasting (first thing in the morning, before eating) and then again 2-hours AFTER a regular size meal blood sugars at home- please write these values down on paper so we can review them together during our future phone call appointments Continue to follow heart healthy, low salt, low cholesterol, carbohydrate-modified, low sugar diet Stay in touch with the Clinical Social Worker and the Pharmacist at Dr. Ronnald Ramp' office Review enclosed educational material       Consent to CCM Services: Kimberly [name] was given information about Chronic Care Management services 07/26/21 including:  CCM service includes personalized support from designated clinical  staff supervised by her physician, including individualized plan of care and coordination with other care providers 24/7 contact phone numbers for assistance for urgent and routine care needs. Service will only be billed when office clinical staff spend 20 minutes or more in a month to coordinate care. Only one practitioner may furnish and bill the service in a calendar month. The patient may stop CCM services at any time (effective at the end of the month) by phone call to the office staff. The patient will be responsible for cost sharing (co-pay) of up to 20% of the  service fee (after annual deductible is met).  Patient agreed to services and verbal consent obtained.   The patient verbalized understanding of instructions, educational materials, and care plan provided today and agreed to receive a mailed copy of patient instructions, educational materials, and care plan Telephone follow up appointment with care management team member scheduled for:  Tuesday, August 24, 2021 at 11:30 am The patient has been provided with contact information for the care management team and has been advised to call with any health related questions or concerns

## 2021-07-27 NOTE — Chronic Care Management (AMB) (Signed)
Chronic Care Management   CCM RN Visit Note  07/27/2021 Name: Kimberly [name] MRN: [id number] DOB: December 13, 1955  Subjective: Kimberly [name] is a 66 y.o. year old female who is a primary care patient of Janith Lima, MD. The care management team was consulted for assistance with disease management and care coordination needs.    Engaged with patient by telephone for initial visit in response to provider referral for case management and/or care coordination services.   Consent to Services:  The patient was given information about Chronic Care Management services, agreed to services, and gave verbal consent 07/26/21 prior to initiation of services.  Please see initial visit note for detailed documentation.  Patient agreed to services and verbal consent obtained.   Assessment: Review of patient past medical history, allergies, medications, health status, including review of consultants reports, laboratory and other test data, was performed as part of comprehensive evaluation and provision of chronic care management services.   SDOH (Social Determinants of Health) assessments and interventions performed:  SDOH Interventions    Flowsheet Row Most Recent Value  SDOH Interventions   Food Insecurity Interventions Intervention Not Indicated  [Patient aware of/ has used food pantries in the past- she avoids using due to high amount of canned food-- she is avoiding salt in her diet,  she declines need for additional resources,  gets $102.00/ month in food stamps]  Housing Interventions Intervention Not Indicated  [Second level apartment x 2 years,  lives alone]  Transportation Interventions Intervention Not Indicated  [Reports her personal vehicle is currently in the shop getting fixed,  while waiting for car to be fixed, she is using insurance benefit to attend provider appointments]      CCM Care Plan  Allergies  Allergen Reactions   Ace Inhibitors Cough   Outpatient Encounter Medications  as of 07/27/2021  Medication Sig Note   albuterol (VENTOLIN HFA) 108 (90 Base) MCG/ACT inhaler Inhale 2 puffs into the lungs every 4 (four) hours as needed. 07/27/2021: 07/27/21- Reports has not needed recently; recalls "I last used it a couple of weeks ago"   amLODipine (NORVASC) 10 MG tablet TAKE ONE TABLET BY MOUTH DAILY    BD PEN NEEDLE NANO U/F 32G X 4 MM MISC USE AS DIRECTED ONCE DAILY    Blood Glucose Monitoring Suppl (CONTOUR NEXT MONITOR) w/Device KIT Use to test blood sugars 4 times daily. Dx: e11.9    budesonide-formoterol (SYMBICORT) 160-4.5 MCG/ACT inhaler INHALE TWO PUFFS BY MOUTH TWICE A DAY    chlorthalidone (HYGROTON) 25 MG tablet Take 1 tablet (25 mg total) by mouth daily.    gabapentin (NEURONTIN) 100 MG capsule Take 100 mg by mouth daily.    glucose blood (CONTOUR NEXT TEST) test strip Use to test blood sugars 4 times daily. Dx:e11.9    insulin glargine (LANTUS SOLOSTAR) 100 UNIT/ML Solostar Pen Inject 80 Units into the skin daily. 07/27/2021: 07/27/21- reports taking between 65-70 units QD in evening, depending on blood sugar levels   Lancets 30G MISC Use to check blood sugars 4 times daily. Dx:e11.9. please let us know if not covered    losartan (COZAAR) 50 MG tablet Take 1 tablet (50 mg total) by mouth daily.    Bempedoic Acid-Ezetimibe (NEXLIZET) 180-10 MG TABS Take 1 tablet by mouth daily. (Patient not taking: Reported on 07/27/2021) 07/27/2021: 07/27/21- reports has stopped taking due to "muscle cramps  in legs from taking it"   cetirizine (ZYRTEC) 10 MG tablet Take 1 tablet (10 mg  total) by mouth daily. (Patient not taking: Reported on 07/27/2021)    Continuous Blood Gluc Receiver (FREESTYLE LIBRE 2 READER) DEVI 1 Act by Does not apply route daily. (Patient not taking: Reported on 07/27/2021) 07/27/2021: 07/27/21- has not yet picked up from pharmacy- not sure she will be able to afford   Continuous Blood Gluc Sensor (FREESTYLE LIBRE 2 SENSOR) MISC 1 Act by Does not apply route daily.  (Patient not taking: Reported on 07/27/2021) 07/27/2021: 07/27/21- reports has not yet picked up from pharmacy- reports may not be able to afford   FARXIGA 10 MG TABS tablet Take 10 mg by mouth daily. (Patient not taking: Reported on 07/27/2021) 07/27/2021: 07/27/21- reports she stopped taking today: said kidney Dr. Candiss Norse called her yesterday (07/26/21) and advised her to stop taking this medication   fluticasone (FLONASE) 50 MCG/ACT nasal spray Place 1 spray into both nostrils daily. (Patient not taking: Reported on 07/27/2021)    Glucagon (GVOKE HYPOPEN 2-PACK) 1 MG/0.2ML SOAJ Inject 1 Act into the skin daily as needed. (Patient not taking: Reported on 07/27/2021)    lubiprostone (AMITIZA) 8 MCG capsule Take 1 capsule (8 mcg total) by mouth 2 (two) times daily with a meal. (Patient not taking: Reported on 07/27/2021) 07/27/2021: 07/27/21- has not yet picked up from pharmacy- waiting for social security check to arrive in early February   [place] Peripheral Neuropathy Cream: Bupivacaine 1%; Doxepin 3%; Gabapentin 6%; Pentoxifylline 3%; Topiramate 1%  Faxed over to [facility] - 05/02/19 with 5 refills (Patient not taking: Reported on 07/27/2021)    rosuvastatin (CRESTOR) 10 MG tablet Take 1 tablet (10 mg total) by mouth daily. (Patient not taking: Reported on 07/27/2021) 07/27/2021: 07/27/21- reports not taking; states that she "was told that she should not be taking both the Rosuvastatin AND the Nexlizet-- reports currently taking neither drug   zinc gluconate 50 MG tablet Take 1 tablet (50 mg total) by mouth daily. (Patient not taking: Reported on 07/27/2021) 07/27/2021: 07/27/21- reports has not yet picked up from pharmacy; waiting for social security check to arrive in early February   No facility-administered encounter medications on file as of 07/27/2021.   Patient Active Problem List   Diagnosis Date Noted   Anemia due to zinc deficiency 07/25/2021   Hyperlipidemia LDL goal <100  07/22/2021   Deficiency anemia 07/22/2021   Type II diabetes mellitus with complication ([place]) 31/51/7616   Diabetic nephropathy associated with type 2 diabetes mellitus ([place]) 07/22/2021   Irritable bowel syndrome with constipation 07/22/2021   Encounter for general adult medical examination with abnormal findings 07/22/2021   Lower extremity edema 01/06/2021   CKD (chronic kidney disease), stage IV ([place]) 10/11/2019   Peripheral neuropathy 06/07/2019   Myalgia due to statin 02/02/2018   Cough variant asthma 10/05/2016   Cataract, left eye 10/17/2014   GERD (gastroesophageal reflux disease) 04/15/2014   Anxiety disorder 04/02/2014   Essential hypertension 05/16/2012   Overweight (BMI 25.0-29.9) 05/16/2012   Poorly controlled type II diabetes mellitus with renal complication ([place]) 07/37/1062   Conditions to be addressed/monitored:  HTN, HLD, and DMII  Care Plan : RN Care Manager Plan of Care  Updates made by Knox Royalty, RN since 07/27/2021 12:00 AM     Problem: Chronic Disease Management Needs   Priority: High     Long-Range Goal: Development of plan of care for long term chronic disease management   Start Date: 07/27/2021  Expected End Date: 07/27/2022  Priority: High  Note:  Current Barriers:  Chronic Disease Management support and education needs related to HTN and DMII Stress around recent worsening of kidney function Questions about medications: CCM Pharmacy team active/ involved Social isolation concerns: since her vehicle has been in the shop, she is not getting out to attend church very often, and is not getting out to get activity like she used to; (normally goes to gym near her home as well as walks regularly around the [facility] where she has a Higher education careers adviser): discussed/ collaborated overall case with CCM CSW, who is active on care team and scheduled to contact patient tomorrow morning; she is hoping to get her vehicle back "soon"  RNCM Clinical Goal(s):   Patient will demonstrate ongoing health management independence as evidenced by adherence to plan of care for DMII/ HTN/ HLD        through collaboration with RN Care manager, provider, and care team.   Interventions: 1:1 collaboration with primary care provider regarding development and update of comprehensive plan of care as evidenced by provider attestation and co-signature Inter-disciplinary care team collaboration (see longitudinal plan of care) Evaluation of current treatment plan related to  self management and patient's adherence to plan as established by provider 07/27/21:  Initial Assessment completed Reviewed recent PCP office visit 07/22/21: she verbalizes a good general understanding of post-visit instructions Pain Assessment completed: denies acute pain today; reports intermittent chronic pain of (L) flank of unknown cause- reports this pain spontaneously subsides without intervention; going to have gynecology evaluation- thinks may be related to ovarian cyst Falls assessment completed: denies falls x 12 months- does not use assistive devices; states "I just take my time and go slow;" fall risk/ prevention education provided/ discussed Medication review completed: patient has many good questions about her medications: we went through each one today using her AVS from PCP Office visit 07/22/21-- I encouraged her to write down on this paper each of the medications she is/ is not currently taking, as well as her questions: she has in-person office visit scheduled with CCM Pharmacist 08/13/21-- encouraged  her to bring her list of medications and question to this visit- she states she will do; medication list updated according to patient report Reviewed upcomming provider office visits with patient and confirmed she is aware of/ has plans to attend all as scheduled 07/28/21- CCM CSW phone appointment 08/02/21- Dr. Candiss Norse, renal 08/13/21- [place] in-person office visit 08/20/21- cardiology  provider, Dr. Oval Linsey "Sometimes in February"- annual vision/ eye appointment  Diabetes:  (Status: New goal.) Long Term Goal   Lab Results  Component Value Date   HGBA1C 8.6 (H) 07/22/2021  Assessed patient's understanding of A1c goal: <7% Provided education to patient about basic DM disease process; Reviewed medications with patient and discussed importance of medication adherence;        Reviewed prescribed diet with patient heart healthy, low salt, low cholesterol, carbohydrate modified/ low sugar; Counseled on importance of regular laboratory monitoring as prescribed;        Discussed plans with patient for ongoing care management follow up and provided patient with direct contact information for care management team;      Review of patient status, including review of consultants reports, relevant laboratory and other test results, and medications completed;       Confirmed patient monitors/ records blood sugars BID: fasting and evening/ 2 hours after eating Reviewed recent blood sugars at home: she reports yesterday fasting of "217;" but tells me today she woke up this morning after  not sleeping well with a low blood sugar reading and "had to eat right away" due to "feeling terrible;" after eating,, checked blood sugar and it was "102;" in general, patient reports vast array of blood sugars throughout each day Assessed patient's understanding of meaning/ significance of A1-C values: good baseline understanding of same- Reviewed individual historical A1-C trends and provided education around correlation of A1-C value to blood sugar levels at home over 3 months-- she is very worried about her kidney function and I encourages patient as we reviewed her trends: she has made good progress in decreasing A1-C levels over last 2 years: provided encouragement that she can decrease her blood sugars even more as we work together Discussed long term complications/ risks of having DMII- high blood  sugars  Hypertension: (Status: New goal.) Long Term Goal  Last practice recorded BP readings:  BP Readings from Last 3 Encounters:  07/22/21 134/68  05/31/21 (!) 162/68  01/06/21 120/62  Most recent eGFR/CrCl:  Lab Results  Component Value Date   EGFR 29 (L) 01/28/2021    No components found for: CRCL  Evaluation of current treatment plan related to hypertension self management and patient's adherence to plan as established by provider;   Reviewed medications with patient and discussed importance of compliance;  Counseled on the importance of exercise goals with target of 150 minutes per week Discussed plans with patient for ongoing care management follow up and provided patient with direct contact information for care management team; Confirmed patient monitors/ records blood pressures at home several times each week: today she reports recently recalled blood pressure of "127/67"  Patient Goals/Self-Care Activities: As evidenced by review of EHR, collaboration with care team, and patient reporting during CCM RN CM outreach,  Patient Sha will: Take medications as prescribed Attend all scheduled provider appointments Call pharmacy for medication refills Call provider office for new concerns or questions Continue to check fasting (first thing in the morning, before eating) and then again 2-hours AFTER a regular size meal blood sugars at home- please write these values down on paper so we can review them together during our future phone call appointments Continue to follow heart healthy, low salt, low cholesterol, carbohydrate-modified, low sugar diet Stay in touch with the Clinical Social Worker and the Pharmacist at Dr. Ronnald Ramp' office Review enclosed educational material       Plan: Telephone follow up appointment with care management team member scheduled for:  Tuesday, August 24, 2021 at 11:30 am The patient has been provided with contact information for the care management  team and has been advised to call with any health related questions or concerns  Oneta Rack, RN, BSN, [place] [phone]: direct office

## 2021-07-28 ENCOUNTER — Ambulatory Visit (INDEPENDENT_AMBULATORY_CARE_PROVIDER_SITE_OTHER): Payer: Medicare Other | Admitting: Licensed Clinical Social Worker

## 2021-07-28 DIAGNOSIS — Z139 Encounter for screening, unspecified: Secondary | ICD-10-CM

## 2021-07-28 DIAGNOSIS — I10 Essential (primary) hypertension: Secondary | ICD-10-CM

## 2021-07-28 DIAGNOSIS — F4329 Adjustment disorder with other symptoms: Secondary | ICD-10-CM

## 2021-07-28 NOTE — Chronic Care Management (AMB) (Signed)
Chronic Care Management   Clinical Social Work Note  07/28/2021 Name: Kimberly [name] MRN: [id number] DOB: [birth year]  Kimberly [name] is a 66 y.o. year old female who is a primary care patient of Janith Lima, MD. The CCM team was consulted to assist the patient with chronic disease management and/or care coordination needs related to: [employer]  and managing stress .   Engaged with patient by telephone for initial visit in response to provider referral for social work chronic care management and care coordination services.   Consent to Services:  The patient was given information about Chronic Care Management services, agreed to services, and gave verbal consent prior to initiation of services.  Please see initial visit note for detailed documentation.   Patient agreed to services and consent obtained.  Summary:  Collaborated with CCM RN in order to meet patient's ongoing needs. Patient is currently experiencing symptoms of  stress and adjustment reaction which seems to be exacerbated by financial strains and concerns with medical condition. Reminded patient of all up coming appointments.  See Care Plan below for interventions and patient self-care actives.  Recommendation: Patient may benefit from, and is in agreement implement interventions discussed today and follow up with insurance provider find out mental health co-pay for counseling.   Follow up Plan: Patient would like continued follow-up from CCM LCSW.  per patient's request will follow up in 3 weeks.  Will call office if needed prior to next encounter.   Assessment: Review of patient past medical history, allergies, medications, and health status, including review of relevant consultants reports was performed today as part of a comprehensive evaluation and provision of chronic care management and care coordination services.     SDOH (Social Determinants of Health) assessments and interventions performed:  SDOH  Interventions    Flowsheet Row Most Recent Value  SDOH Interventions   Financial Strain Interventions Other (Comment)  [careguide]  Stress Interventions Provide Counseling        Advanced Directives Status: Not addressed in this encounter.  CCM Care Plan  Conditions to be addressed/monitored:  adjustment reaction, and stress ; Financial constraints related to fixed income  Care Plan : LCSW Plan of Care  Updates made by Maurine Cane, LCSW since 07/28/2021 12:00 AM     Problem: Coping Skills      Goal: Coping Skills Enhanced by Managing Stressors   Start Date: 07/28/2021  This Visit's Progress: On track  Priority: High  Note:   Current Barriers:  Financial Strain  and Stress  CSW Clinical Goal(s):  Patient  will work with Education officer, museum to address concerns related to coping skills  through collaboration with [employer] representative, provider, and care team.   Interventions: 1:1 collaboration with primary care provider regarding development and update of comprehensive plan of care as evidenced by provider attestation and co-signature Inter-disciplinary care team collaboration (see longitudinal plan of care) Evaluation of current treatment plan related to  self management and patient's adherence to plan as established by provider Review resources, discussed options and provided patient information about  Department of Social Services ( has food stamps ) Referral to care guide (for financial strain resources)  Discussed therapy options   Social Determinants of Health in Patient with HTN, DMII, and Anxiety:  (Status: New goal.) SDOH assessments completed: Transportation, Financial Strain , and Stress Evaluation of current treatment plan related to Stress at home due to medical issues Solution-Focused Strategies employed:  Emotional Support Provided Behavioral Activation reviewed  Problem Solving /Task Center strategies reviewed Provided EMMI education information on Reliving  Stress Discussed self-care action plan: (gym, massage  chairs, etc)   Patient Self-Care Activities: Call your insurance provider to follow up on you co-pay amount for counseling  I have placed a referral to the community resource care guide they will call you with resources  Review your EMMI educational information (Reliving stress) Look for an e-mail from [employer]. Continue with your self-care action plan: going to the gym and walking     Casimer Lanius, LCSW Licensed Clinical Social Worker [facility] [place]  [phone]

## 2021-07-28 NOTE — Patient Instructions (Signed)
Visit Information   Thank you for taking time to visit with me today. Please don't hesitate to contact me if I can be of assistance to you before our next scheduled telephone appointment.  Our next appointment is by telephone on Feb 22nd at 10:30  Please call the care guide team at [phone] if you need to cancel or reschedule your appointment.   If you are experiencing a Mental Health or [place] or need someone to talk to, please call the [place] National Suicide Prevention Lifeline: (253) 024-2220 or TTY: 818-668-2226 TTY 667-812-0854) to talk to a trained counselor call 1-800-273-TALK (toll free, 24 hour hotline)   Following is a copy of your full care plan:  Care Plan : LCSW Plan of Care  Updates made by Maurine Cane, LCSW since 07/28/2021 12:00 AM     Problem: Coping Skills      Goal: Coping Skills Enhanced by Managing Stressors   Start Date: 07/28/2021  This Visit's Progress: On track  Priority: High  Note:   Current Barriers:  Financial Strain  and Stress  CSW Clinical Goal(s):  Patient  will work with Education officer, museum to address concerns related to coping skills  through collaboration with [place] representative, provider, and care team.   Interventions: 1:1 collaboration with primary care provider regarding development and update of comprehensive plan of care as evidenced by provider attestation and co-signature Inter-disciplinary care team collaboration (see longitudinal plan of care) Evaluation of current treatment plan related to  self management and patient's adherence to plan as established by provider Review resources, discussed options and provided patient information about  Department of Social Services ( has food stamps ) Referral to care guide (for financial strain resources)  Discussed therapy options   Social Determinants of Health in Patient with HTN, DMII, and Anxiety:  (Status: New goal.) SDOH assessments completed: Transportation,  Financial Strain , and Stress Evaluation of current treatment plan related to Stress at home due to medical issues Solution-Focused Strategies employed:  Emotional Support Provided Behavioral Activation reviewed Problem [place] strategies reviewed Provided EMMI education information on Reliving Stress Discussed self-care action plan: (gym, massage  chairs, etc)   Patient Self-Care Activities: Call your insurance provider to follow up on you co-pay amount for counseling  I have placed a referral to the community resource care guide they will call you with resources  Review your EMMI educational information (Reliving stress) Look for an e-mail from [employer]. Continue with your self-care action plan: going to the gym and walking     Consent to CCM Services: Ms. Clos was given information about Chronic Care Management services including:  CCM service includes personalized support from designated clinical staff supervised by her physician, including individualized plan of care and coordination with other care providers 24/7 contact phone numbers for assistance for urgent and routine care needs. Service will only be billed when office clinical staff spend 20 minutes or more in a month to coordinate care. Only one practitioner may furnish and bill the service in a calendar month. The patient may stop CCM services at any time (effective at the end of the month) by phone call to the office staff. The patient will be responsible for cost sharing (co-pay) of up to 20% of the service fee (after annual deductible is met).  Patient agreed to services and verbal consent obtained.   Patient verbalizes understanding of instructions and care plan provided today and agrees to view in [place]. Active  MyChart status confirmed with patient.     Casimer Lanius, LCSW Licensed Clinical Social Worker [facility]  [place]  [phone]

## 2021-07-29 ENCOUNTER — Telehealth: Payer: Self-pay

## 2021-07-29 NOTE — Telephone Encounter (Signed)
° °  Telephone encounter was:  Unsuccessful.  07/29/2021 Name: Suleyma Wafer MRN: [id number] DOB: Sep 27, 1955  Unsuccessful outbound call made today to assist with:  Food Insecurity and Financial Difficulties related to food and financial assistance  Outreach Attempt:  1st Attempt  A HIPAA compliant voice message was left requesting a return call.  Instructed patient to call back at earliest convenience.    [place], Care Management  [phone] [address], [place], [place] [address] Phone: [phone] Email: [email or web address]

## 2021-07-29 NOTE — Telephone Encounter (Signed)
° °  Telephone encounter was:  Successful.  07/29/2021 Name: Kimberly [name] MRN: [id number] DOB: [birth year]  Kimberly [name] is a 66 y.o. year old female who is a primary care patient of Janith Lima, MD . The community resource team was consulted for assistance with Food Insecurity and Financial Difficulties related to food and financial strain  Care guide performed the following interventions: Patient provided with information about care guide support team and interviewed to confirm resource needs. Patient stated she dont have enough money to pay bills and buy food. Her food stamps have been reduced. She has aleady had help with utility asistance. Patient asked that I mail esources to her  Follow Up Plan:  Care guide will follow up with patient by phone over the next week    [place], Care Management  [phone] [address], [place], [place] [address] Phone: [phone] Email: [email or web address]

## 2021-07-30 ENCOUNTER — Telehealth: Payer: Self-pay

## 2021-07-30 NOTE — Telephone Encounter (Signed)
° °  Telephone encounter was:  Unsuccessful.  07/30/2021 Name: Adel Neyer MRN: [id number] DOB: 10-11-1955  Unsuccessful outbound call made today to assist with:  Food Insecurity and Financial Difficulties related to financial assistance  Mailing resources to Patient   [place], Care Management  [phone] [address], [place], [place] [address] Phone: [phone] Email: [email or web address]

## 2021-08-02 ENCOUNTER — Telehealth: Payer: Self-pay

## 2021-08-02 DIAGNOSIS — N184 Chronic kidney disease, stage 4 (severe): Secondary | ICD-10-CM | POA: Diagnosis not present

## 2021-08-02 DIAGNOSIS — I129 Hypertensive chronic kidney disease with stage 1 through stage 4 chronic kidney disease, or unspecified chronic kidney disease: Secondary | ICD-10-CM | POA: Diagnosis not present

## 2021-08-02 DIAGNOSIS — D631 Anemia in chronic kidney disease: Secondary | ICD-10-CM | POA: Diagnosis not present

## 2021-08-02 DIAGNOSIS — R809 Proteinuria, unspecified: Secondary | ICD-10-CM | POA: Diagnosis not present

## 2021-08-02 NOTE — Progress Notes (Signed)
Chronic Care Management Pharmacy Assistant   Name: Kimberly [name]  MRN: [id number] DOB: 04/14/1956  Kimberly [name] is an 66 y.o. year old female who presents for his initial CCM visit with the clinical pharmacist.  Reason for Encounter: Initial Visit with CPP    Recent office visits:  07/22/21 Janith Lima, MD-PCP (Annual Exam) Labs ordered, Referral to ophthalmology and Referral to [facility], Med changes: discontinuedFreeStyle Libre 2 Sensor, changed Lantus SoloStar, started on lubiprostone, Gvoke HypoPen 2-Pack, FreeStyle Libre 2 Sensor, [employer] 2 Reader, and rosuvastatin.  Recent consult visits:  05/31/21 Skeet Latch, MD-Cardiology (Essential Hypertension) Lab Work: BMP in 1 week, Medication Instructions: STOP- Hydrochlorothiazide, and Pravastatin. Start Chorthalidone 25 mg and Nexlizest 180-10 mg, Increase Losartan 50 mg  Hospital visits:  None in previous 6 months  Medications: Outpatient Encounter Medications as of 08/02/2021  Medication Sig Note   albuterol (VENTOLIN HFA) 108 (90 Base) MCG/ACT inhaler Inhale 2 puffs into the lungs every 4 (four) hours as needed. 07/27/2021: 07/27/21- Reports has not needed recently; recalls "I last used it a couple of weeks ago"   amLODipine (NORVASC) 10 MG tablet TAKE ONE TABLET BY MOUTH DAILY    BD PEN NEEDLE NANO U/F 32G X 4 MM MISC USE AS DIRECTED ONCE DAILY    Bempedoic Acid-Ezetimibe (NEXLIZET) 180-10 MG TABS Take 1 tablet by mouth daily. (Patient not taking: Reported on 07/27/2021) 07/27/2021: 07/27/21- reports has stopped taking due to "muscle cramps  in legs from taking it"   Blood Glucose Monitoring Suppl (CONTOUR NEXT MONITOR) w/Device KIT Use to test blood sugars 4 times daily. Dx: e11.9    budesonide-formoterol (SYMBICORT) 160-4.5 MCG/ACT inhaler INHALE TWO PUFFS BY MOUTH TWICE A DAY    cetirizine (ZYRTEC) 10 MG tablet Take 1 tablet (10 mg total) by mouth daily. (Patient not taking: Reported on  07/27/2021)    chlorthalidone (HYGROTON) 25 MG tablet Take 1 tablet (25 mg total) by mouth daily.    Continuous Blood Gluc Receiver (FREESTYLE LIBRE 2 READER) DEVI 1 Act by Does not apply route daily. (Patient not taking: Reported on 07/27/2021) 07/27/2021: 07/27/21- has not yet picked up from pharmacy- not sure she will be able to afford   Continuous Blood Gluc Sensor (FREESTYLE LIBRE 2 SENSOR) MISC 1 Act by Does not apply route daily. (Patient not taking: Reported on 07/27/2021) 07/27/2021: 07/27/21- reports has not yet picked up from pharmacy- reports may not be able to afford   FARXIGA 10 MG TABS tablet Take 10 mg by mouth daily. (Patient not taking: Reported on 07/27/2021) 07/27/2021: 07/27/21- reports she stopped taking today: said kidney Dr. Candiss Norse called her yesterday (07/26/21) and advised her to stop taking this medication   fluticasone (FLONASE) 50 MCG/ACT nasal spray Place 1 spray into both nostrils daily. (Patient not taking: Reported on 07/27/2021)    gabapentin (NEURONTIN) 100 MG capsule Take 100 mg by mouth daily.    Glucagon (GVOKE HYPOPEN 2-PACK) 1 MG/0.2ML SOAJ Inject 1 Act into the skin daily as needed. (Patient not taking: Reported on 07/27/2021)    glucose blood (CONTOUR NEXT TEST) test strip Use to test blood sugars 4 times daily. Dx:e11.9    insulin glargine (LANTUS SOLOSTAR) 100 UNIT/ML Solostar Pen Inject 80 Units into the skin daily. 07/27/2021: 07/27/21- reports taking between 65-70 units QD in evening, depending on blood sugar levels   Lancets 30G MISC Use to check blood sugars 4 times daily. Dx:e11.9. please let us know if not covered  losartan (COZAAR) 50 MG tablet Take 1 tablet (50 mg total) by mouth daily.    lubiprostone (AMITIZA) 8 MCG capsule Take 1 capsule (8 mcg total) by mouth 2 (two) times daily with a meal. (Patient not taking: Reported on 07/27/2021) 07/27/2021: 07/27/21- has not yet picked up from pharmacy- waiting for social security check to arrive in early February   [facility] Peripheral Neuropathy Cream: Bupivacaine 1%; Doxepin 3%; Gabapentin 6%; Pentoxifylline 3%; Topiramate 1%  Faxed over to [facility] - 05/02/19 with 5 refills (Patient not taking: Reported on 07/27/2021)    rosuvastatin (CRESTOR) 10 MG tablet Take 1 tablet (10 mg total) by mouth daily. (Patient not taking: Reported on 07/27/2021) 07/27/2021: 07/27/21- reports not taking; states that she "was told that she should not be taking both the Rosuvastatin AND the Nexlizet-- reports currently taking neither drug   zinc gluconate 50 MG tablet Take 1 tablet (50 mg total) by mouth daily. (Patient not taking: Reported on 07/27/2021) 07/27/2021: 07/27/21- reports has not yet picked up from pharmacy; waiting for social security check to arrive in early February   No facility-administered encounter medications on file as of 08/02/2021.   Medication List:  albuterol (VENTOLIN HFA) 108 (90 Base) MCG/ACT inhaler amLODipine (NORVASC) 10 MG tablet-last fill 06/02/21 90 ds BD PEN NEEDLE NANO U/F 32G X 4 MM MISC Bempedoic Acid-Ezetimibe (NEXLIZET) 180-10 MG TABS-last fill 07/07/21 30 DS Blood Glucose Monitoring Suppl (CONTOUR NEXT MONITOR) w/Device KIT budesonide-formoterol (SYMBICORT) 160-4.5 MCG/ACT inhaler-last fill 06/10/21 30 ds cetirizine (ZYRTEC) 10 MG tablet chlorthalidone (HYGROTON) 25 MG tablet-last fill 05/31/21 90 ds Continuous Blood Gluc Receiver (FREESTYLE LIBRE 2 READER) DEVI Continuous Blood Gluc Sensor (FREESTYLE LIBRE 2 SENSOR) MISC FARXIGA 10 MG TABS tablet-last fill 07/21/21 30 ds fluticasone (FLONASE) 50 MCG/ACT nasal spray gabapentin (NEURONTIN) 100 MG capsule-last fill 07/15/21 30 ds Glucagon (GVOKE HYPOPEN 2-PACK) 1 MG/0.2ML SOAJ-last fill 07/23/21 30 ds glucose blood (CONTOUR NEXT TEST) test strip insulin glargine (LANTUS SOLOSTAR) 100 UNIT/ML Solostar Pen-last fill 07/11/21 87 ds Lancets 30G MISC losartan (COZAAR) 50 MG tablet-last fill 05/31/21 90 ds lubiprostone  (AMITIZA) 8 MCG capsule-last fill 07/26/21 90 ds NON FORMULARY rosuvastatin (CRESTOR) 10 MG tablet-last fill 07/23/21 90 ds zinc gluconate 50 MG tablet-last fill 07/25/21 90 ds  Care Gaps: Colonoscopy-07/18/16 Diabetic Foot Exam-07/22/21 Mammogram-04/16/21 Ophthalmology-NA Dexa Scan - NA Annual Well Visit - 07/22/21 Micro albumin-NA Hemoglobin A1c- 07/22/21  Star Rating Drugs: Rosuvastatin 10 mg-last fill 07/23/21 90 ds   Ethelene Hal Clinical Pharmacist Assistant [phone]

## 2021-08-03 DIAGNOSIS — R102 Pelvic and perineal pain: Secondary | ICD-10-CM | POA: Diagnosis not present

## 2021-08-03 DIAGNOSIS — N83209 Unspecified ovarian cyst, unspecified side: Secondary | ICD-10-CM | POA: Diagnosis not present

## 2021-08-04 ENCOUNTER — Telehealth: Payer: Self-pay

## 2021-08-04 NOTE — Telephone Encounter (Signed)
° °  Telephone encounter was:  Successful.  08/04/2021 Name: Kimberly [name] MRN: [id number] DOB: 1955-10-15  Kimberly [name] is a 66 y.o. year old female who is a primary care patient of Janith Lima, MD . The community resource team was consulted for assistance with follow up to confirm patient recieved the resources in the mail  Care guide performed the following interventions: Patient provided with information about care guide support team and interviewed to confirm resource needs.  Follow Up Plan:  No further follow up planned at this time. The patient has been provided with needed resources.    [place] [facility], Care Management  [phone] [address], [place], [place] [address] Phone: [phone] Email: [email or web address]

## 2021-08-05 ENCOUNTER — Telehealth: Payer: Self-pay

## 2021-08-05 NOTE — Telephone Encounter (Signed)
° °  Telephone encounter was:  Successful.  08/05/2021 Name: Kimberly [name] MRN: [id number] DOB: [birth year]  Kimberly [name] is a 66 y.o. year old female who is a primary care patient of Janith Lima, MD . The community resource team was consulted for assistance with Financial Difficulties related to financial strain  Care guide performed the following interventions: Patient provided with information about care guide support team and interviewed to confirm resource needs.following up with patient to see if she recieved resources in the mail  Follow Up Plan:  No further follow up planned at this time. The patient has been provided with needed resources.    [place], Care Management  [phone] [address], [place], [place] [address] Phone: [phone] Email: [email or web address]

## 2021-08-10 DIAGNOSIS — R1909 Other intra-abdominal and pelvic swelling, mass and lump: Secondary | ICD-10-CM | POA: Diagnosis not present

## 2021-08-10 DIAGNOSIS — N83201 Unspecified ovarian cyst, right side: Secondary | ICD-10-CM | POA: Diagnosis not present

## 2021-08-10 DIAGNOSIS — R1903 Right lower quadrant abdominal swelling, mass and lump: Secondary | ICD-10-CM | POA: Diagnosis not present

## 2021-08-12 ENCOUNTER — Telehealth: Payer: Self-pay | Admitting: *Deleted

## 2021-08-12 NOTE — Telephone Encounter (Signed)
Attempted to reach the patient to schedule a new patient appt with Dr Berline Lopes, voicemail full

## 2021-08-12 NOTE — Chronic Care Management (AMB) (Signed)
°  Care Management   Note  08/12/2021 Name: Kimberly [name] MRN: [id number] DOB: August 11, 1955  [name] is a 66 y.o. year old female who is a primary care patient of Janith Lima, MD and is actively engaged with the care management team. I reached out to Charlann Noss by phone today to assist with re-scheduling a follow up visit with the RN Case Manager and Licensed Clinical Social Worker  Follow up plan: Patient declines further follow up and engagement by [facility] and the SW with the  care management team. Appropriate care team members and provider have been notified via electronic communication. The care management team is available to follow up with the patient after provider conversation with the patient regarding recommendation for care management engagement and subsequent re-referral to the care management team.   [facility] Management  Direct Dial: [phone]

## 2021-08-13 ENCOUNTER — Ambulatory Visit: Payer: Medicare Other

## 2021-08-13 NOTE — Telephone Encounter (Signed)
Spoke with the patient and scheduled a new patient appt with Dr Berline Lopes on 3/2 at 10:30 am. Patient given the address and phone number for the clinic, along with the policy for mask and visitors

## 2021-08-16 ENCOUNTER — Encounter: Payer: Self-pay | Admitting: *Deleted

## 2021-08-16 ENCOUNTER — Ambulatory Visit: Payer: Self-pay | Admitting: *Deleted

## 2021-08-16 DIAGNOSIS — E118 Type 2 diabetes mellitus with unspecified complications: Secondary | ICD-10-CM

## 2021-08-16 DIAGNOSIS — I10 Essential (primary) hypertension: Secondary | ICD-10-CM

## 2021-08-16 NOTE — Chronic Care Management (AMB) (Signed)
Chronic Care Management   CCM RN Visit Note  08/16/2021 Name: Kimberly [name] MRN: [id number] DOB: [birth year]  Subjective: Kimberly [name] is a 66 y.o. year old female who is a primary care patient of Janith Lima, MD. The care management team was consulted for assistance with disease management and care coordination needs.    Collaboration with CCM scheduling care guide  for  CCM RN CM case closure  in response to provider referral for case management and/or care coordination services.   Consent to Services:  The patient was given information about Chronic Care Management services, agreed to services, and gave verbal consent prior to initiation of services.  Please see initial visit note for detailed documentation.  Patient agreed to services and verbal consent obtained.   Assessment: Review of patient past medical history, allergies, medications, health status, including review of consultants reports, laboratory and other test data, was performed as part of comprehensive evaluation and provision of chronic care management services.  CCM Care Plan  Allergies  Allergen Reactions   Ace Inhibitors Cough   Outpatient Encounter Medications as of 08/16/2021  Medication Sig Note   albuterol (VENTOLIN HFA) 108 (90 Base) MCG/ACT inhaler Inhale 2 puffs into the lungs every 4 (four) hours as needed. 07/27/2021: 07/27/21- Reports has not needed recently; recalls "I last used it a couple of weeks ago"   amLODipine (NORVASC) 10 MG tablet TAKE ONE TABLET BY MOUTH DAILY    BD PEN NEEDLE NANO U/F 32G X 4 MM MISC USE AS DIRECTED ONCE DAILY    Bempedoic Acid-Ezetimibe (NEXLIZET) 180-10 MG TABS Take 1 tablet by mouth daily. (Patient not taking: Reported on 07/27/2021) 07/27/2021: 07/27/21- reports has stopped taking due to "muscle cramps  in legs from taking it"   Blood Glucose Monitoring Suppl (CONTOUR NEXT MONITOR) w/Device KIT Use to test blood sugars 4 times daily. Dx: e11.9    budesonide-formoterol  (SYMBICORT) 160-4.5 MCG/ACT inhaler INHALE TWO PUFFS BY MOUTH TWICE A DAY    cetirizine (ZYRTEC) 10 MG tablet Take 1 tablet (10 mg total) by mouth daily. (Patient not taking: Reported on 07/27/2021)    chlorthalidone (HYGROTON) 25 MG tablet Take 1 tablet (25 mg total) by mouth daily.    Continuous Blood Gluc Receiver (FREESTYLE LIBRE 2 READER) DEVI 1 Act by Does not apply route daily. (Patient not taking: Reported on 07/27/2021) 07/27/2021: 07/27/21- has not yet picked up from pharmacy- not sure she will be able to afford   Continuous Blood Gluc Sensor (FREESTYLE LIBRE 2 SENSOR) MISC 1 Act by Does not apply route daily. (Patient not taking: Reported on 07/27/2021) 07/27/2021: 07/27/21- reports has not yet picked up from pharmacy- reports may not be able to afford   FARXIGA 10 MG TABS tablet Take 10 mg by mouth daily. (Patient not taking: Reported on 07/27/2021) 07/27/2021: 07/27/21- reports she stopped taking today: said kidney Dr. Candiss Norse called her yesterday (07/26/21) and advised her to stop taking this medication   fluticasone (FLONASE) 50 MCG/ACT nasal spray Place 1 spray into both nostrils daily. (Patient not taking: Reported on 07/27/2021)    gabapentin (NEURONTIN) 100 MG capsule Take 100 mg by mouth daily.    Glucagon (GVOKE HYPOPEN 2-PACK) 1 MG/0.2ML SOAJ Inject 1 Act into the skin daily as needed. (Patient not taking: Reported on 07/27/2021)    glucose blood (CONTOUR NEXT TEST) test strip Use to test blood sugars 4 times daily. Dx:e11.9    insulin glargine (LANTUS SOLOSTAR) 100 UNIT/ML Solostar Pen Inject 80  Units into the skin daily. 07/27/2021: 07/27/21- reports taking between 65-70 units QD in evening, depending on blood sugar levels   Lancets 30G MISC Use to check blood sugars 4 times daily. Dx:e11.9. please let us know if not covered    losartan (COZAAR) 50 MG tablet Take 1 tablet (50 mg total) by mouth daily.    lubiprostone (AMITIZA) 8 MCG capsule Take 1 capsule (8 mcg total) by mouth 2 (two) times  daily with a meal. (Patient not taking: Reported on 07/27/2021) 07/27/2021: 07/27/21- has not yet picked up from pharmacy- waiting for social security check to arrive in early February   [facility] Peripheral Neuropathy Cream: Bupivacaine 1%; Doxepin 3%; Gabapentin 6%; Pentoxifylline 3%; Topiramate 1%  Faxed over to [facility] - 05/02/19 with 5 refills (Patient not taking: Reported on 07/27/2021)    rosuvastatin (CRESTOR) 10 MG tablet Take 1 tablet (10 mg total) by mouth daily. (Patient not taking: Reported on 07/27/2021) 07/27/2021: 07/27/21- reports not taking; states that she "was told that she should not be taking both the Rosuvastatin AND the Nexlizet-- reports currently taking neither drug   zinc gluconate 50 MG tablet Take 1 tablet (50 mg total) by mouth daily. (Patient not taking: Reported on 07/27/2021) 07/27/2021: 07/27/21- reports has not yet picked up from pharmacy; waiting for social security check to arrive in early February   No facility-administered encounter medications on file as of 08/16/2021.   Patient Active Problem List   Diagnosis Date Noted   Anemia due to zinc deficiency 07/25/2021   Hyperlipidemia LDL goal <100 07/22/2021   Deficiency anemia 07/22/2021   Type II diabetes mellitus with complication ([facility]) 29/24/4628   Diabetic nephropathy associated with type 2 diabetes mellitus ([facility]) 07/22/2021   Irritable bowel syndrome with constipation 07/22/2021   Encounter for general adult medical examination with abnormal findings 07/22/2021   Lower extremity edema 01/06/2021   CKD (chronic kidney disease), stage IV ([place]) 10/11/2019   Peripheral neuropathy 06/07/2019   Myalgia due to statin 02/02/2018   Cough variant asthma 10/05/2016   Cataract, left eye 10/17/2014   GERD (gastroesophageal reflux disease) 04/15/2014   Anxiety disorder 04/02/2014   Essential hypertension 05/16/2012   Overweight (BMI 25.0-29.9) 05/16/2012   Poorly controlled type II  diabetes mellitus with renal complication ([place]) 63/81/7711   Conditions to be addressed/monitored:  HTN and DMII  Care Plan : RN Care Manager Plan of Care  Updates made by Knox Royalty, RN since 08/16/2021 12:00 AM     Problem: Chronic Disease Management Needs   Priority: High     Long-Range Goal: Development of plan of care for long term chronic disease management   Start Date: 07/27/2021  Expected End Date: 07/27/2022  Priority: High  Note:   Current Barriers:  Chronic Disease Management support and education needs related to HTN and DMII Stress around recent worsening of kidney function Questions about medications: CCM Pharmacy team active/ involved Social isolation concerns: since her vehicle has been in the shop, she is not getting out to attend church very often, and is not getting out to get activity like she used to; (normally goes to gym near her home as well as walks regularly around the [employer] where she has a Higher education careers adviser): discussed/ collaborated overall case with CCM CSW, who is active on care team and scheduled to contact patient tomorrow morning; she is hoping to get her vehicle back "soon"  RNCM Clinical Goal(s):  Patient will demonstrate ongoing health management  independence as evidenced by adherence to plan of care for DMII/ HTN/ HLD        through collaboration with RN Care manager, provider, and care team.   Interventions: 1:1 collaboration with primary care provider regarding development and update of comprehensive plan of care as evidenced by provider attestation and co-signature Inter-disciplinary care team collaboration (see longitudinal plan of care) Evaluation of current treatment plan related to  self management and patient's adherence to plan as established by provider  08/16/21: Care Coordination outreach with CCM scheduling care guide: notified by scheduling care guide that patient has declined ongoing outreach/ participation with CCM RN CM ;  RN CM case closed accordingly; PCP made aware    Plan: No further follow up required: CCM RN CM case closure- patient declines ongoing participation/ outreach from [place], RN, BSN, [place] [phone]: direct office

## 2021-08-18 ENCOUNTER — Telehealth: Payer: Medicare Other

## 2021-08-19 ENCOUNTER — Other Ambulatory Visit: Payer: Self-pay | Admitting: Family Medicine

## 2021-08-20 ENCOUNTER — Ambulatory Visit (HOSPITAL_BASED_OUTPATIENT_CLINIC_OR_DEPARTMENT_OTHER): Payer: Medicare Other | Admitting: Cardiovascular Disease

## 2021-08-24 ENCOUNTER — Telehealth: Payer: Medicare Other

## 2021-08-24 DIAGNOSIS — I10 Essential (primary) hypertension: Secondary | ICD-10-CM

## 2021-08-24 DIAGNOSIS — E118 Type 2 diabetes mellitus with unspecified complications: Secondary | ICD-10-CM

## 2021-08-25 ENCOUNTER — Telehealth: Payer: Medicare Other

## 2021-08-26 ENCOUNTER — Other Ambulatory Visit: Payer: Self-pay

## 2021-08-26 ENCOUNTER — Encounter: Payer: Self-pay | Admitting: Gynecologic Oncology

## 2021-08-26 ENCOUNTER — Inpatient Hospital Stay: Payer: Medicare Other | Attending: Gynecologic Oncology | Admitting: Gynecologic Oncology

## 2021-08-26 VITALS — BP 138/63 | HR 74 | Temp 98.0°F | Resp 16 | Ht 71.26 in | Wt 187.0 lb

## 2021-08-26 DIAGNOSIS — E1122 Type 2 diabetes mellitus with diabetic chronic kidney disease: Secondary | ICD-10-CM | POA: Insufficient documentation

## 2021-08-26 DIAGNOSIS — N200 Calculus of kidney: Secondary | ICD-10-CM | POA: Diagnosis not present

## 2021-08-26 DIAGNOSIS — K581 Irritable bowel syndrome with constipation: Secondary | ICD-10-CM

## 2021-08-26 DIAGNOSIS — N184 Chronic kidney disease, stage 4 (severe): Secondary | ICD-10-CM | POA: Diagnosis not present

## 2021-08-26 DIAGNOSIS — D3912 Neoplasm of uncertain behavior of left ovary: Secondary | ICD-10-CM | POA: Insufficient documentation

## 2021-08-26 DIAGNOSIS — E118 Type 2 diabetes mellitus with unspecified complications: Secondary | ICD-10-CM

## 2021-08-26 DIAGNOSIS — N9489 Other specified conditions associated with female genital organs and menstrual cycle: Secondary | ICD-10-CM

## 2021-08-26 DIAGNOSIS — E785 Hyperlipidemia, unspecified: Secondary | ICD-10-CM | POA: Insufficient documentation

## 2021-08-26 DIAGNOSIS — E1136 Type 2 diabetes mellitus with diabetic cataract: Secondary | ICD-10-CM | POA: Diagnosis not present

## 2021-08-26 DIAGNOSIS — D3911 Neoplasm of uncertain behavior of right ovary: Secondary | ICD-10-CM | POA: Insufficient documentation

## 2021-08-26 DIAGNOSIS — E1121 Type 2 diabetes mellitus with diabetic nephropathy: Secondary | ICD-10-CM

## 2021-08-26 DIAGNOSIS — Z90710 Acquired absence of both cervix and uterus: Secondary | ICD-10-CM | POA: Diagnosis not present

## 2021-08-26 DIAGNOSIS — Z79899 Other long term (current) drug therapy: Secondary | ICD-10-CM | POA: Diagnosis not present

## 2021-08-26 DIAGNOSIS — K5900 Constipation, unspecified: Secondary | ICD-10-CM | POA: Diagnosis not present

## 2021-08-26 DIAGNOSIS — I129 Hypertensive chronic kidney disease with stage 1 through stage 4 chronic kidney disease, or unspecified chronic kidney disease: Secondary | ICD-10-CM | POA: Insufficient documentation

## 2021-08-26 DIAGNOSIS — K8020 Calculus of gallbladder without cholecystitis without obstruction: Secondary | ICD-10-CM | POA: Diagnosis not present

## 2021-08-26 NOTE — Patient Instructions (Signed)
It was a pleasure meeting you today.  As we discussed, by your outside ultrasound, you have a simple cyst near your right ovary and a smaller cystic mass or solid mass on the left ovary.  Because I am unable to see these pictures, we talked about next steps in terms of management.  We will work to get your prior ultrasounds from [place] sent over to [place]. ? ?We will plan to get an MRI of your pelvis to look more closely at both ovaries.  Neither blood test that you had can diagnose a cancer.  Once we have your MRI, I will call you to discuss how concerning for possible cancer either or both ovaries appear.  In the setting of your other medical problems, most notably her diabetes, surgery is not without risk.  Ideally, we will get your diabetes under better control before moving forward with surgery.  If the MRI is overall reassuring and the risk of cancer appears to be low, we do not have to schedule surgery and can talk about a follow-up plan to keep an eye on both ovaries. ?

## 2021-08-26 NOTE — Progress Notes (Signed)
GYNECOLOGIC ONCOLOGY NEW PATIENT CONSULTATION   Patient Name: Kimberly [name]  Patient Age: 66 y.o. Date of Service: 08/26/21 Referring Provider: Dr. Louretta Shorten  Primary Care Provider: Janith Lima, MD Consulting Provider: Jeral Pinch, MD   Assessment/Plan:  Postmenopausal patient with bilateral adnexal masses.   The patient was first noted to have a simple cystic lesion in the right adnexa on CT last year when she presented with acute left abdominal/flank pain. She's had multiple follow-up ultrasounds since (I only have access to the report of the most recent one). I had the patient sign a release of records today and will work to get at least the reports of other ultrasounds that she has had in the last 9 months.   While I do not have access to her recent ultrasound images, we discussed that she continues to have a simple appearing cystic lesion in the right adnexa (mild increase in size compared to CT, but discussed difficulty comparing different imaging modalities). There are two small possibly complex or solid masses now being seen in the left adnexa. I do not know if these are new or have been seen on prior ultrasounds.   We discussed that imaging is not diagnostic. Since I am unable to see the images, it is difficult to give my interpretation regarding characteristics of the mass and my concern for possible malignancy. We also discussed recent blood work that she has had including a CA-125 and CEA. While her CA-125 is normal, I stressed that this is not a test that is approved for diagnostic use. CA-125 can be normal in up to 50% of early stage ovarian cancers and elevated in many non cancerous processes. Her CEA is normal although slightly elevated for a non-smoker. This may or may not be related to her bilateral adnexal masses.   The patient has significant medical comorbidities, notably her diabetes and resultant CKD. She would very much like to avoid surgery if it is  safe/possible. Ideally, if she needs surgery, her diabetes would be under mildly better control (goal Hgb A1c of <8%).  If terms of next steps, we discussed several options. The first would be to move forward with diagnostic surgery in the near future, after getting clearance from her various specialists. The second would be to repeat imaging now - I would favor a pelvic MRI if our goal is avoiding surgery. The third option would be to repeat imaging, with either a pelvic ultrasound or MRI in 3 months to assess for interval change in size or character of either adnexal mass (although this will be somewhat difficult since we don't have pictures from prior ultrasounds for which to compare).   After discussing all 3 options, the patient would like to move forward with pelvic MRI at this time. We will also work to get the reports from her ultrasounds in 2022. Depending on MRI results and concern for malignancy, we will either move forward with scheduling surgery or outline a follow-up plan.  With regard to her intermittent left mid-abdominal and flank pain, I suspect that this is related to constipation. I have encouraged her to start OTC medication, such as MiraLAX, to improve bowel function. While kidney stone was seen on her CT last summer, I think that a non-obstructing stone is unlikely to be causing her systems.  A copy of this note was sent to the patient's referring provider.   60 minutes of total time was spent for this patient encounter, including preparation, face-to-face counseling with  the patient and coordination of care, and documentation of the encounter.   Jeral Pinch, MD  Division of Gynecologic Oncology  Department of Obstetrics and Gynecology  [facility]  ___________________________________________  Chief Complaint: Chief Complaint  Patient presents with   Adnexal mass    History of Present Illness:  Kimberly [name] is a 66 y.o. y.o. female  who is seen in consultation at the request of Dr. Corinna Capra for an evaluation of bilateral adnexal masses.  Patient reports left-sided intermittent flank pain for the last 2 years, is often associated with pain radiating across the mid abdomen towards her right side.  She had a visit to the emergency department last June for this pain.  CT imaging at that time on showed dependent gallstones and nonobstrucing left renal stone. Right simple appearing 5.3cm adnexal mass. 6-12 month imaging follow-up recommended.   She followed up with [facility] and had several ultrasounds in the fall.  Most recent ultrasound imaging was with Dr. Corinna Capra at [facility] on 08/10/21.  This showed a right adnexa measures 2.8 x 1 x 1.7 cm.  Left ovary measures 4.5 x 3.4 x 2.4 cm.  In the right adnexa, there is a 7.5 x 3.3 x 5 cm elongated simple cyst without blood flow that appears to be adjacent to the ovary, possible paraovarian cyst versus hydrosalpinx.  The left ovary contains a 2.7 x 2.6 questionable solid mass versus complex cystic mass with internal echoes.  No blood flow seen to this mass.  There is also another 11 x 6 mm area that has the same appearance.  No free fluid noted.  Tumor markers on 2/14: CA-125 was 9.7, CEA was 4.5  Patient describes the pain is intermittent, not associated with any precipitating factor.  It often last a couple of hours.  She denies any associated symptoms including nausea or emesis.  She also notes that the pain eases after she has a bowel movement.  She endorses a history of constipation at baseline and increases her fiber intake.  She has noted her stool to be dark which she thinks started after she began taking iron last year.  She is not using any over-the-counter medications for her bowels.  She endorses a good appetite without nausea or emesis.  She denies any urinary symptoms other than frequency.    Medical history is notable for type 2 diabetes.  Her last hemoglobin A1c  8.6%.  She has chronic kidney disease stage IV and is followed by a nephrologist at [facility].  She also sees a cardiologist at [facility].  PAST MEDICAL HISTORY:  Past Medical History:  Diagnosis Date   Arthritis    Cataract    OD   Chronic kidney disease    Diabetes mellitus 2001   Hyperlipidemia Dx 2012   Hypertension 2001   Lower extremity edema 01/06/2021     PAST SURGICAL HISTORY:  Past Surgical History:  Procedure Laterality Date   CATARACT EXTRACTION Left    EYE SURGERY Left    Cat Sx   VAGINAL HYSTERECTOMY     AUB (surgery in [place]), prior to 2000    OB/GYN HISTORY:  OB History  Gravida Para Term Preterm AB Living  2 1          SAB IAB Ectopic Multiple Live Births               # Outcome Date GA Lbr Len/2nd Weight Sex Delivery Anes PTL Lv  2 Gravida           1 Para             No LMP recorded. Patient has had a hysterectomy.  Age at menarche: 37 Age at menopause: 27 Hx of HRT: Denies Hx of STDs: Denies Last pap: 2022 per her report History of abnormal pap smears: Denies  SCREENING STUDIES:  Last mammogram: 03/2021  Last colonoscopy: 2018 Last bone mineral density: 2021  MEDICATIONS: Outpatient Encounter Medications as of 08/26/2021  Medication Sig   albuterol (VENTOLIN HFA) 108 (90 Base) MCG/ACT inhaler Inhale 2 puffs into the lungs every 4 (four) hours as needed.   amLODipine (NORVASC) 10 MG tablet TAKE ONE TABLET BY MOUTH DAILY   Blood Glucose Monitoring Suppl (CONTOUR NEXT MONITOR) w/Device KIT Use to test blood sugars 4 times daily. Dx: e11.9   budesonide-formoterol (SYMBICORT) 160-4.5 MCG/ACT inhaler INHALE TWO PUFFS BY MOUTH TWICE A DAY   cetirizine (ZYRTEC) 10 MG tablet Take 1 tablet (10 mg total) by mouth daily.   chlorthalidone (HYGROTON) 25 MG tablet Take 1 tablet (25 mg total) by mouth daily.   fluticasone (FLONASE) 50 MCG/ACT nasal spray Place 1 spray into both nostrils daily.   gabapentin (NEURONTIN) 100 MG capsule Take 100 mg  by mouth daily.   glucose blood (CONTOUR NEXT TEST) test strip Use to test blood sugars 4 times daily. Dx:e11.9   insulin glargine (LANTUS SOLOSTAR) 100 UNIT/ML Solostar Pen Inject 80 Units into the skin daily.   Lancets 30G MISC Use to check blood sugars 4 times daily. Dx:e11.9. please let us know if not covered   losartan (COZAAR) 50 MG tablet Take 1 tablet (50 mg total) by mouth daily.   lubiprostone (AMITIZA) 8 MCG capsule Take 1 capsule (8 mcg total) by mouth 2 (two) times daily with a meal.   rosuvastatin (CRESTOR) 10 MG tablet Take 1 tablet (10 mg total) by mouth daily.   zinc gluconate 50 MG tablet Take 1 tablet (50 mg total) by mouth daily.   BD PEN NEEDLE NANO U/F 32G X 4 MM MISC USE AS DIRECTED ONCE DAILY (Patient not taking: Reported on 08/26/2021)   Bempedoic Acid-Ezetimibe (NEXLIZET) 180-10 MG TABS Take 1 tablet by mouth daily. (Patient not taking: Reported on 07/27/2021)   Continuous Blood Gluc Receiver (FREESTYLE LIBRE 2 READER) DEVI 1 Act by Does not apply route daily. (Patient not taking: Reported on 07/27/2021)   Continuous Blood Gluc Sensor (FREESTYLE LIBRE 2 SENSOR) MISC 1 Act by Does not apply route daily. (Patient not taking: Reported on 07/27/2021)   FARXIGA 10 MG TABS tablet Take 10 mg by mouth daily. (Patient not taking: Reported on 07/27/2021)   Glucagon (GVOKE HYPOPEN 2-PACK) 1 MG/0.2ML SOAJ Inject 1 Act into the skin daily as needed. (Patient not taking: Reported on 07/27/2021)   NON FORMULARY [facility] Peripheral Neuropathy Cream: Bupivacaine 1%; Doxepin 3%; Gabapentin 6%; Pentoxifylline 3%; Topiramate 1%  Faxed over to [place] - 05/02/19 with 5 refills (Patient not taking: Reported on 07/27/2021)   No facility-administered encounter medications on file as of 08/26/2021.    ALLERGIES:  Allergies  Allergen Reactions   Ace Inhibitors Cough     FAMILY HISTORY:  Family History  Problem Relation Age of Onset   Depression Mother    Hypertension  Mother    Diabetes Mother    Heart disease Father    Heart attack Father 50   Uterine cancer Sister    Breast cancer Sister  Lung cancer Brother    Prostate cancer Neg Hx    Endometrial cancer Neg Hx    Ovarian cancer Neg Hx    Colon cancer Neg Hx    Pancreatic cancer Neg Hx      SOCIAL HISTORY:  Social Connections: Moderately Integrated   Frequency of Communication with Friends and Family: More than three times a week   Frequency of Social Gatherings with Friends and Family: More than three times a week   Attends Religious Services: More than 4 times per year   Active Member of Clubs or Organizations: Yes   Attends Music therapist: More than 4 times per year   Marital Status: Divorced    REVIEW OF SYSTEMS:  Pertinent positives include knee swelling, constipation, urinary frequency, joint pain, muscle pain/cramps, itching. Denies appetite changes, fevers, chills, fatigue, unexplained weight changes. Denies hearing loss, neck lumps or masses, mouth sores, ringing in ears or voice changes. Denies cough or wheezing.  Denies shortness of breath. Denies chest pain or palpitations.  Denies abdominal distention, pain, blood in stools, diarrhea, nausea, vomiting, or early satiety. Denies pain with intercourse, dysuria, hematuria or incontinence. Denies hot flashes, pelvic pain, vaginal bleeding or vaginal discharge.   Denies rash, or wounds. Denies dizziness, headaches, numbness or seizures. Denies swollen lymph nodes or glands, denies easy bruising or bleeding. Denies anxiety, depression, confusion, or decreased concentration.  Physical Exam:  Vital Signs for this encounter:  Blood pressure 138/63, pulse 74, temperature 98 F (36.7 C), temperature source Oral, resp. rate 16, height 5' 11.26" (1.81 m), weight 187 lb (84.8 kg), SpO2 100 %. Body mass index is 25.89 kg/m. General: Alert, oriented, no acute distress.  HEENT: Normocephalic, atraumatic. Sclera  anicteric.  Chest: Clear to auscultation bilaterally. No wheezes, rhonchi, or rales. Cardiovascular: Regular rate and rhythm, no murmurs, rubs, or gallops.  Abdomen: Obese. Normoactive bowel sounds. Soft, nondistended, nontender to palpation. No masses or hepatosplenomegaly appreciated. No palpable fluid wave.  Well-healed laparoscopic incision within the umbilicus. Extremities: Grossly normal range of motion. Warm, well perfused. No edema bilaterally.  Skin: No rashes or lesions.  Lymphatics: No cervical, supraclavicular, or inguinal adenopathy.  GU:  Normal external female genitalia. No lesions. No discharge or bleeding.             Bladder/urethra:  No lesions or masses, well supported bladder             Vagina: Mildly atrophic, no lesions, bleeding or discharge noted.  Some redundancy of the vagina noted.             Cervix/uterus: Surgically absent.             Adnexa: On bimanual exam, some fullness within the mid pelvis and to the right, smooth, no masses or fullness appreciated on the left.  No nodularity.  LABORATORY AND RADIOLOGIC DATA:  Outside medical records were reviewed to synthesize the above history, along with the history and physical obtained during the visit.   Lab Results  Component Value Date   WBC 7.3 07/22/2021   HGB 12.0 07/22/2021   HCT 36.5 07/22/2021   PLT 230.0 07/22/2021   GLUCOSE 205 (H) 07/22/2021   CHOL 212 (H) 01/28/2021   TRIG 161 (H) 01/28/2021   HDL 45 01/28/2021   LDLDIRECT 158.0 10/07/2019   LDLCALC 138 (H) 01/28/2021   ALT 15 07/22/2021   AST 20 07/22/2021   NA 137 07/22/2021   K 3.8 07/22/2021   CL 96 07/22/2021  CREATININE 2.63 (H) 07/22/2021   BUN 65 (H) 07/22/2021   CO2 28 07/22/2021   TSH 1.88 07/22/2021   HGBA1C 8.6 (H) 07/22/2021   MICROALBUR 80.6 (H) 01/01/2021   CT A/P on 12/16/20: IMPRESSION: Dependent gallstones.   Nonobstructing left upper pole renal stone.   Right adnexal simple appearing cyst measuring 5.3 cm in  greatest dimension. Recommend follow-up US in 6-12 months. Note: This recommendation does not apply to premenarchal patients and to those with increased risk (genetic, family history, elevated tumor markers or other high-risk factors) of ovarian cancer.

## 2021-08-27 ENCOUNTER — Telehealth: Payer: Self-pay | Admitting: *Deleted

## 2021-08-27 NOTE — Telephone Encounter (Signed)
Per Dr Berline Lopes fax records request for [place] scans to [facility] ?

## 2021-08-27 NOTE — Telephone Encounter (Signed)
Received [place] san from [place], placed on DR Berline Lopes' desk  ?

## 2021-08-29 ENCOUNTER — Ambulatory Visit (HOSPITAL_COMMUNITY)
Admission: RE | Admit: 2021-08-29 | Discharge: 2021-08-29 | Disposition: A | Discharge status: Home / Self Care | Payer: Medicare Other | Source: Ambulatory Visit | Attending: Gynecologic Oncology | Admitting: Gynecologic Oncology

## 2021-08-29 DIAGNOSIS — N9489 Other specified conditions associated with female genital organs and menstrual cycle: Secondary | ICD-10-CM | POA: Diagnosis not present

## 2021-08-29 DIAGNOSIS — Z780 Asymptomatic menopausal state: Secondary | ICD-10-CM | POA: Diagnosis not present

## 2021-08-29 DIAGNOSIS — N83201 Unspecified ovarian cyst, right side: Secondary | ICD-10-CM | POA: Diagnosis not present

## 2021-08-29 DIAGNOSIS — Z90710 Acquired absence of both cervix and uterus: Secondary | ICD-10-CM | POA: Diagnosis not present

## 2021-08-29 MED ORDER — GADOBUTROL 1 MMOL/ML IV SOLN
8.0000 mL | Freq: Once | INTRAVENOUS | Status: AC | PRN
  Administered 2021-08-29: 8 mL via INTRAVENOUS

## 2021-08-30 ENCOUNTER — Telehealth: Payer: Self-pay | Admitting: Gynecologic Oncology

## 2021-08-30 NOTE — Telephone Encounter (Signed)
Received records from [facility] ? ?Pelvic ultrasound on 01/04/2021: Left ovary measures 4.5 x 2 x 3 cm.  There are 2 cyst, the first measuring up to 2.08 cm and the second 2.35 cm.  Left ovary noted to have a 2.1 cm echogenic nodule with hypervascularity.  There is also a 2.4 cm avascular heterogenous hypoechoic nodule.  Right ovary is measured at 8.4 x 3.5 x 4.2 cm.  There is a 6.7 x 2.7 x 2.9 cm septated fluid-filled cyst. ? ?Pelvic ultrasound exam on 04/12/2021: Left ovary measures 3.4 x 2.2 x 2.5 cm.  Right ovary measures 2.5 x 1.7 x 1.1 cm.  Left ovary noted to have a 1.8 x 1.8 cm cyst.  Right ovary described in the report as having a 7.6 cm elongated fluid-filled cyst with septation still visualized, thought to maybe represent a hydrosalpinx.  Left ovary visualization limited due to bowel.  Hypoechoic cyst still present but stable. ?

## 2021-08-31 ENCOUNTER — Inpatient Hospital Stay (HOSPITAL_BASED_OUTPATIENT_CLINIC_OR_DEPARTMENT_OTHER): Payer: Medicare Other | Admitting: Gynecologic Oncology

## 2021-08-31 ENCOUNTER — Other Ambulatory Visit: Payer: Self-pay

## 2021-08-31 DIAGNOSIS — N9489 Other specified conditions associated with female genital organs and menstrual cycle: Secondary | ICD-10-CM

## 2021-08-31 NOTE — Progress Notes (Signed)
Gynecologic Oncology Telehealth Consult Note: Gyn-Onc  I connected with Charlann Noss on 08/31/21 at  4:40 PM EST by telephone and verified that I am speaking with the correct person using two identifiers.  I discussed the limitations, risks, security and privacy concerns of performing an evaluation and management service by telemedicine and the availability of in-person appointments. I also discussed with the patient that there may be a patient responsible charge related to this service. The patient expressed understanding and agreed to proceed.  Other persons participating in the visit and their role in the encounter: None.  Patient's location: Home Provider's location: [facility]  Reason for Visit: Follow-up imaging study, treatment discussion  Treatment History: Patient reports left-sided intermittent flank pain for the last 2 years, is often associated with pain radiating across the mid abdomen towards her right side.  She had a visit to the emergency department last June for this pain.  CT imaging at that time on showed dependent gallstones and nonobstrucing left renal stone. Right simple appearing 5.3cm adnexal mass. 6-12 month imaging follow-up recommended.    She followed up with [facility] and had several ultrasounds in the fall:   Pelvic ultrasound on 01/04/2021: Left ovary measures 4.5 x 2 x 3 cm.  There are 2 cyst, the first measuring up to 2.08 cm and the second 2.35 cm.  Left ovary noted to have a 2.1 cm echogenic nodule with hypervascularity.  There is also a 2.4 cm avascular heterogenous hypoechoic nodule.  Right ovary is measured at 8.4 x 3.5 x 4.2 cm.  There is a 6.7 x 2.7 x 2.9 cm septated fluid-filled cyst.   Pelvic ultrasound exam on 04/12/2021: Left ovary measures 3.4 x 2.2 x 2.5 cm.  Right ovary measures 2.5 x 1.7 x 1.1 cm.  Left ovary noted to have a 1.8 x 1.8 cm cyst.  Right ovary described in the report as having a 7.6 cm elongated fluid-filled cyst with  septation still visualized, thought to maybe represent a hydrosalpinx.  Left ovary visualization limited due to bowel.  Hypoechoic cyst still present but stable.  Most recent ultrasound imaging was with Dr. Corinna Capra at [facility] on 08/10/21.  This showed a right adnexa measures 2.8 x 1 x 1.7 cm.  Left ovary measures 4.5 x 3.4 x 2.4 cm.  In the right adnexa, there is a 7.5 x 3.3 x 5 cm elongated simple cyst without blood flow that appears to be adjacent to the ovary, possible paraovarian cyst versus hydrosalpinx.  The left ovary contains a 2.7 x 2.6 questionable solid mass versus complex cystic mass with internal echoes.  No blood flow seen to this mass.  There is also another 11 x 6 mm area that has the same appearance.  No free fluid noted.   Tumor markers on 2/14: CA-125 was 9.7, CEA was 4.5  Interval History: Patient denies any new symptoms since her recent visit with me.  Overall feels well.  Thinks that her intermittent left abdominal pain is related to constipation is moving her bowels seems to improve this.  Past Medical/Surgical History: Past Medical History:  Diagnosis Date   Arthritis    Cataract    OD   Chronic kidney disease    Diabetes mellitus 2001   Hyperlipidemia Dx 2012   Hypertension 2001   Lower extremity edema 01/06/2021    Past Surgical History:  Procedure Laterality Date   CATARACT EXTRACTION Left    EYE SURGERY Left  Cat Sx   VAGINAL HYSTERECTOMY     AUB (surgery in [place]), prior to 2000    Family History  Problem Relation Age of Onset   Depression Mother    Hypertension Mother    Diabetes Mother    Heart disease Father    Heart attack Father 40   Uterine cancer Sister    Breast cancer Sister    Lung cancer Brother    Prostate cancer Neg Hx    Endometrial cancer Neg Hx    Ovarian cancer Neg Hx    Colon cancer Neg Hx    Pancreatic cancer Neg Hx     Social History   Socioeconomic History   Marital status: Divorced    Spouse name: Not  on file   Number of children: Not on file   Years of education: Not on file   Highest education level: Not on file  Occupational History   Occupation: retired  Tobacco Use   Smoking status: Never   Smokeless tobacco: Never  Vaping Use   Vaping Use: Never used  Substance and Sexual Activity   Alcohol use: Yes    Comment: occ   Drug use: No   Sexual activity: Not Currently  Other Topics Concern   Not on file  Social History Narrative   Not on file   Social Determinants of Health   Financial Resource Strain: High Risk   Difficulty of Paying Living Expenses: Very hard  Food Insecurity: Food Insecurity Present   Worried About Charity fundraiser in the Last Year: Often true   Arboriculturist in the Last Year: Often true  Transportation Needs: No Transportation Needs   Lack of Transportation (Medical): No   Lack of Transportation (Non-Medical): No  Physical Activity: Not on file  Stress: Stress Concern Present   Feeling of Stress : Very much  Social Connections: Moderately Integrated   Frequency of Communication with Friends and Family: More than three times a week   Frequency of Social Gatherings with Friends and Family: More than three times a week   Attends Religious Services: More than 4 times per year   Active Member of Clubs or Organizations: Yes   Attends Music therapist: More than 4 times per year   Marital Status: Divorced    Current Medications:  Current Outpatient Medications:    albuterol (VENTOLIN HFA) 108 (90 Base) MCG/ACT inhaler, Inhale 2 puffs into the lungs every 4 (four) hours as needed., Disp: , Rfl:    amLODipine (NORVASC) 10 MG tablet, TAKE ONE TABLET BY MOUTH DAILY, Disp: 90 tablet, Rfl: 2   BD PEN NEEDLE NANO U/F 32G X 4 MM MISC, USE AS DIRECTED ONCE DAILY (Patient not taking: Reported on 08/26/2021), Disp: 100 each, Rfl: 4   Blood Glucose Monitoring Suppl (CONTOUR NEXT MONITOR) w/Device KIT, Use to test blood sugars 4 times daily. Dx:  e11.9, Disp: 1 kit, Rfl: 1   budesonide-formoterol (SYMBICORT) 160-4.5 MCG/ACT inhaler, INHALE TWO PUFFS BY MOUTH TWICE A DAY, Disp: 10.2 g, Rfl: 1   cetirizine (ZYRTEC) 10 MG tablet, Take 1 tablet (10 mg total) by mouth daily., Disp: 30 tablet, Rfl: 0   chlorthalidone (HYGROTON) 25 MG tablet, Take 1 tablet (25 mg total) by mouth daily., Disp: 90 tablet, Rfl: 3   Continuous Blood Gluc Receiver (FREESTYLE LIBRE 2 READER) DEVI, 1 Act by Does not apply route daily. (Patient not taking: Reported on 07/27/2021), Disp: 2 each, Rfl: 5   Continuous Blood  Gluc Sensor (FREESTYLE LIBRE 2 SENSOR) MISC, 1 Act by Does not apply route daily. (Patient not taking: Reported on 07/27/2021), Disp: 2 each, Rfl: 5   fluticasone (FLONASE) 50 MCG/ACT nasal spray, Place 1 spray into both nostrils daily., Disp: 16 g, Rfl: 0   gabapentin (NEURONTIN) 100 MG capsule, Take 100 mg by mouth daily., Disp: , Rfl:    glucose blood (CONTOUR NEXT TEST) test strip, Use to test blood sugars 4 times daily. Dx:e11.9, Disp: 400 each, Rfl: 12   insulin glargine (LANTUS SOLOSTAR) 100 UNIT/ML Solostar Pen, Inject 80 Units into the skin daily., Disp: 72 mL, Rfl: 1   Lancets 30G MISC, Use to check blood sugars 4 times daily. Dx:e11.9. please let us know if not covered, Disp: 400 each, Rfl: 12   losartan (COZAAR) 50 MG tablet, Take 1 tablet (50 mg total) by mouth daily., Disp: 90 tablet, Rfl: 3   lubiprostone (AMITIZA) 8 MCG capsule, Take 1 capsule (8 mcg total) by mouth 2 (two) times daily with a meal., Disp: 180 capsule, Rfl: 1   NON FORMULARY, [facility] Peripheral Neuropathy Cream: Bupivacaine 1%; Doxepin 3%; Gabapentin 6%; Pentoxifylline 3%; Topiramate 1%  Faxed over to [facility] - 05/02/19 with 5 refills (Patient not taking: Reported on 07/27/2021), Disp: , Rfl:    rosuvastatin (CRESTOR) 10 MG tablet, Take 1 tablet (10 mg total) by mouth daily., Disp: 90 tablet, Rfl: 1   zinc gluconate 50 MG tablet, Take 1 tablet (50 mg total)  by mouth daily., Disp: 90 tablet, Rfl: 1  Review of Symptoms: Pertinent positives as per HPI.  Physical Exam: There were no vitals taken for this visit. Deferred given limitations of phone visit.  Laboratory & Radiologic Studies: MRI pelvis 08/29/21: IMPRESSION: 1. Thinly septated fluid signal cysts of the right ovary measuring 5.3 cm and 2.8 cm. No associated solid component or contrast enhancement. Given initially established stability these are almost certainly benign. However given size and postmenopausal status, consider additional ultrasound follow-up in 1-2 years to ensure long-term stability. 2. Status post hysterectomy. 3. Sigmoid diverticula.  Assessment & Plan: Shene Maxfield is a 66 y.o. woman with cystic lesion of the right ovary now with very reassuring MRI.  Discussed with patient the findings from the MRI.  While not definitively diagnostic, appearance and overall simple nature of her right ovarian cyst is very reassuring.  I do not see any lesion of the left ovary.  I offered the patient several options moving forward.  She remains relatively asymptomatic and the symptoms that she is having I think are related to GI function not her ovarian cyst.  I discussed that we could proceed with diagnostic surgery although again, I think the pelvic findings very likely represent benign disease.  The other option would be to repeat imaging in approximately 1 year to assure stable size and appearance of the cyst.  If the patient were to develop new symptoms in the interim, she would call me for further evaluation.  After discussing both options, the patient very much desires to avoid surgery if at all possible.  I think that this is very reasonable and a safe option.  I have placed an order for a pelvic ultrasound which we will plan to schedule after the new year.  I discussed the assessment and treatment plan with the patient. The patient was provided with an opportunity to ask questions  and all were answered. The patient agreed with the plan and demonstrated an understanding of the  instructions.   The patient was advised to call back or see an in-person evaluation if the symptoms worsen or if the condition fails to improve as anticipated.   18 minutes of total time was spent for this patient encounter, including preparation, face-to-face counseling with the patient and coordination of care, and documentation of the encounter.   Jeral Pinch, MD  Division of Gynecologic Oncology  Department of Obstetrics and Gynecology  [facility]

## 2021-09-01 ENCOUNTER — Encounter: Payer: Self-pay | Admitting: Gynecologic Oncology

## 2021-09-03 ENCOUNTER — Encounter: Payer: Self-pay | Admitting: Internal Medicine

## 2021-09-10 ENCOUNTER — Telehealth: Payer: Self-pay | Admitting: *Deleted

## 2021-09-10 NOTE — Telephone Encounter (Signed)
Per Dr Berline Lopes called and scheduled an [place] scan for the patient on 1/3//24 at 10 am. Called and gave the patient the appt date/time. Explained that once the schedule for Jan is open, the office would call her to schedule a phone visit couple days after.  ?

## 2021-09-21 ENCOUNTER — Other Ambulatory Visit: Payer: Self-pay | Admitting: Internal Medicine

## 2021-09-21 DIAGNOSIS — E118 Type 2 diabetes mellitus with unspecified complications: Secondary | ICD-10-CM

## 2021-10-02 ENCOUNTER — Other Ambulatory Visit: Payer: Self-pay | Admitting: Family Medicine

## 2021-10-04 ENCOUNTER — Telehealth (HOSPITAL_BASED_OUTPATIENT_CLINIC_OR_DEPARTMENT_OTHER): Payer: Self-pay

## 2021-10-04 NOTE — Telephone Encounter (Signed)
Fax request received for refill HCTZ.  Per Dr Blenda Mounts previous office neote and updated med list HCTZ was discontinued.  Pharmacy notified. Georgana Curio MHA RN CCM ?

## 2021-10-12 ENCOUNTER — Ambulatory Visit (HOSPITAL_BASED_OUTPATIENT_CLINIC_OR_DEPARTMENT_OTHER): Payer: Medicare Other | Admitting: Cardiovascular Disease

## 2021-10-28 ENCOUNTER — Telehealth: Payer: Self-pay

## 2021-10-28 NOTE — Progress Notes (Signed)
? ? ?Chronic Care Management ?Pharmacy Assistant  ? ?Name: Kimberly [name]  MRN: [id number] DOB: August 06, 1955 ? ? ?Reason for Encounter: Disease State-General  ?  ? ?Recent office visits:  ?None since last coordination call on 08/16/21 ? ?Recent consult visits:  ?08/31/21 Kimberly Mosses, MD-Gynecologic/Oncology (Adnexal mass) Orders: US Pelvic Complete With Transviginal; No med changes ? ?08/26/21 Kimberly Mosses, MD-Gynecologic/Oncology (Adnexal mass) Orders: MR Pelvis W Wo Contrast; Medication changes: Patient has stopped Bempedoic acid, dapagliflozin 10 mg, Glucagon 1 mg ? ?Hospital visits:  ?None since last coordination call  ? ?Medications: ?Outpatient Encounter Medications as of 10/28/2021  ?Medication Sig Note  ? albuterol (VENTOLIN HFA) 108 (90 Base) MCG/ACT inhaler Inhale 2 puffs into the lungs every 4 (four) hours as needed. 07/27/2021: 07/27/21- Reports has not needed recently; recalls "I last used it a couple of weeks ago"  ? amLODipine (NORVASC) 10 MG tablet TAKE ONE TABLET BY MOUTH DAILY   ? BD PEN NEEDLE NANO U/F 32G X 4 MM MISC USE AS DIRECTED ONCE DAILY (Patient not taking: Reported on 08/26/2021)   ? Blood Glucose Monitoring Suppl (CONTOUR NEXT MONITOR) w/Device KIT Use to test blood sugars 4 times daily. Dx: e11.9   ? budesonide-formoterol (SYMBICORT) 160-4.5 MCG/ACT inhaler INHALE TWO PUFFS BY MOUTH TWICE A DAY   ? cetirizine (ZYRTEC) 10 MG tablet Take 1 tablet (10 mg total) by mouth daily. 08/26/2021: PRN during allergy season   ? chlorthalidone (HYGROTON) 25 MG tablet Take 1 tablet (25 mg total) by mouth daily.   ? Continuous Blood Gluc Receiver (FREESTYLE LIBRE 2 READER) DEVI 1 Act by Does not apply route daily. (Patient not taking: Reported on 07/27/2021)   ? Continuous Blood Gluc Sensor (FREESTYLE LIBRE 2 SENSOR) MISC 1 Act by Does not apply route daily. (Patient not taking: Reported on 07/27/2021)   ? fluticasone (FLONASE) 50 MCG/ACT nasal spray Place 1 spray into both nostrils daily. 08/26/2021: PRN  during allergy season   ? gabapentin (NEURONTIN) 100 MG capsule Take 100 mg by mouth daily.   ? glucose blood (CONTOUR NEXT TEST) test strip Use to test blood sugars 4 times daily. Dx:e11.9   ? insulin glargine (LANTUS SOLOSTAR) 100 UNIT/ML Solostar Pen Inject 80 Units into the skin daily. 07/27/2021: 07/27/21- reports taking between 65-70 units QD in evening, depending on blood sugar levels  ? Lancets 30G MISC Use to check blood sugars 4 times daily. Dx:e11.9. please let us know if not covered   ? losartan (COZAAR) 50 MG tablet Take 1 tablet (50 mg total) by mouth daily.   ? lubiprostone (AMITIZA) 8 MCG capsule Take 1 capsule (8 mcg total) by mouth 2 (two) times daily with a meal.   ? NON [place] ?Peripheral Neuropathy Cream: Bupivacaine 1%; Doxepin 3%; Gabapentin 6%; Pentoxifylline 3%; Topiramate 1% ? ?Faxed over to [facility] - 05/02/19 with 5 refills (Patient not taking: Reported on 07/27/2021)   ? rosuvastatin (CRESTOR) 10 MG tablet Take 1 tablet (10 mg total) by mouth daily.   ? zinc gluconate 50 MG tablet Take 1 tablet (50 mg total) by mouth daily.   ? ?No facility-administered encounter medications on file as of 10/28/2021.  ? ?Contacted Charlann Noss for General Review Call ? ? ?Chart Review: ? ?Have there been any documented new, changed, or discontinued medications since last visit? Yes (If yes, include name, dose, frequency, date)Medication changes: Patient has stopped Bempedoic acid, dapagliflozin 10 mg, Glucagon 1 mg ?Has there been any documented  recent hospitalizations or ED visits since last visit with Clinical Pharmacist? No ?Brief Summary (including medication and/or Diagnosis changes): ? ? ?Adherence Review: ? ?Does the Clinical Pharmacist Assistant have access to adherence rates? Yes ?Adherence rates for STAR metric medications (List medication(s)/day supply/ last 2 fill dates). ?Adherence rates for medications indicated for disease state being reviewed (List  medication(s)/day supply/ last 2 fill dates). ?Does the patient have >5 day gap between last estimated fill dates for any of the above medications or other medication gaps? No ?Reason for medication gaps. ? ? ?Disease State Questions: ? ?Able to connect with Patient? Yes ?Did patient have any problems with their health recently? No ?Note problems and Concerns: ?Have you had any admissions or emergency room visits or worsening of your condition(s) since last visit? No ?Details of ED visit, hospital visit and/or worsening condition(s): ?Have you had any visits with new specialists or providers since your last visit? Yes ?Explain:Tucker, Corinna Lines, MD-Gynecologic/Oncology (Adnexal mass) 08/26/21 and 08/31/21 ?Have you had any new health care problem(s) since your last visit? No ?New problem(s) reported: ?Have you run out of any of your medications since you last spoke with clinical pharmacist? No ?What caused you to run out of your medications? ?Are there any medications you are not taking as prescribed? No ?What kept you from taking your medications as prescribed? ?Are you having any issues or side effects with your medications? No ?Note of issues or side effects: ?Do you have any other health concerns or questions you want to discuss with your Clinical Pharmacist before your next visit? No ?Note additional concerns and questions from Patient. ?Are there any health concerns that you feel we can do a better job addressing? No ?Note Patient's response. ?Are you having any problems with any of the following since the last visit: (select all that apply) ? None ? Details: ?12. Any falls since last visit? No ? Details: ?13. Any increased or uncontrolled pain since last visit? No ? Details: ?14. Next visit Type: Patient declined appt ?       ?15. Additional Details? No ?   ?Care Gaps: ?Colonoscopy-07/18/16 ?Diabetic Foot Exam-07/22/21 ?Mammogram-04/16/21 ?Ophthalmology-NA ?Dexa Scan - NA ?Annual Well Visit - NA ?Micro  albumin-01/01/22 ?Hemoglobin A1c- 07/22/21 ? ?Star Rating Drugs: ?Rosuvastatin 10 mg-last fill 10/17/21 90 ds ?Losartan 50 mg-last fill 08/27/21 90 ds ? ?Ethelene Hal ?Clinical Pharmacist Assistant ?[phone]  ?

## 2021-11-03 DIAGNOSIS — I129 Hypertensive chronic kidney disease with stage 1 through stage 4 chronic kidney disease, or unspecified chronic kidney disease: Secondary | ICD-10-CM | POA: Diagnosis not present

## 2021-11-03 DIAGNOSIS — D631 Anemia in chronic kidney disease: Secondary | ICD-10-CM | POA: Diagnosis not present

## 2021-11-03 DIAGNOSIS — R809 Proteinuria, unspecified: Secondary | ICD-10-CM | POA: Diagnosis not present

## 2021-11-03 DIAGNOSIS — N184 Chronic kidney disease, stage 4 (severe): Secondary | ICD-10-CM | POA: Diagnosis not present

## 2021-11-04 ENCOUNTER — Other Ambulatory Visit: Payer: Self-pay | Admitting: Family Medicine

## 2021-11-09 ENCOUNTER — Ambulatory Visit (INDEPENDENT_AMBULATORY_CARE_PROVIDER_SITE_OTHER): Payer: Medicare Other

## 2021-11-09 DIAGNOSIS — Z0000 Encounter for general adult medical examination without abnormal findings: Secondary | ICD-10-CM | POA: Diagnosis not present

## 2021-11-09 NOTE — Progress Notes (Signed)
?I connected with Kimberly [name] today by telephone and verified that I am speaking with the correct person using two identifiers. ?Location patient: home ?Location provider: work ?Persons participating in the virtual visit: patient, provider. ?  ?I discussed the limitations, risks, security and privacy concerns of performing an evaluation and management service by telephone and the availability of in person appointments. I also discussed with the patient that there may be a patient responsible charge related to this service. The patient expressed understanding and verbally consented to this telephonic visit.  ?  ?Interactive audio and video telecommunications were attempted between this provider and patient, however failed, due to patient having technical difficulties OR patient did not have access to video capability.  We continued and completed visit with audio only. ? ?Some vital signs may be absent or patient reported.  ? ?Time Spent with patient on telephone encounter: 30 minutes ? ?Subjective:  ? Kimberly [name] is a 66 y.o. female who presents for Medicare Annual (Subsequent) preventive examination. ? ?Review of Systems    ? ?Cardiac Risk Factors include: advanced age (>61mn, >>26women);diabetes mellitus;dyslipidemia;family history of premature cardiovascular disease;hypertension ? ?   ?Objective:  ?  ?There were no vitals filed for this visit. ?There is no height or weight on file to calculate BMI. ? ? ?  11/09/2021  ?  9:52 AM 08/26/2021  ? 11:40 AM 07/09/2018  ?  2:07 PM 05/06/2016  ? 10:29 AM 01/06/2015  ?  4:59 PM 10/17/2014  ?  5:02 PM 08/21/2014  ?  2:34 PM  ?Advanced Directives  ?Does Patient Have a Medical Advance Directive? No No No No No No No  ?Would patient like information on creating a medical advance directive? No - Patient declined No - Patient declined Yes (MAU/Ambulatory/Procedural Areas - Information given) No - patient declined information  No - patient declined information No - patient  declined information  ? ? ?Current Medications (verified) ?Outpatient Encounter Medications as of 11/09/2021  ?Medication Sig  ? albuterol (VENTOLIN HFA) 108 (90 Base) MCG/ACT inhaler Inhale 2 puffs into the lungs every 4 (four) hours as needed.  ? amLODipine (NORVASC) 10 MG tablet TAKE ONE TABLET BY MOUTH DAILY  ? BD PEN NEEDLE NANO U/F 32G X 4 MM MISC USE AS DIRECTED ONCE DAILY (Patient not taking: Reported on 08/26/2021)  ? Blood Glucose Monitoring Suppl (CONTOUR NEXT MONITOR) w/Device KIT Use to test blood sugars 4 times daily. Dx: e11.9  ? budesonide-formoterol (SYMBICORT) 160-4.5 MCG/ACT inhaler INHALE TWO PUFFS BY MOUTH TWICE A DAY  ? cetirizine (ZYRTEC) 10 MG tablet Take 1 tablet (10 mg total) by mouth daily.  ? chlorthalidone (HYGROTON) 25 MG tablet Take 1 tablet (25 mg total) by mouth daily.  ? Continuous Blood Gluc Receiver (FREESTYLE LIBRE 2 READER) DEVI 1 Act by Does not apply route daily. (Patient not taking: Reported on 07/27/2021)  ? Continuous Blood Gluc Sensor (FREESTYLE LIBRE 2 SENSOR) MISC 1 Act by Does not apply route daily. (Patient not taking: Reported on 07/27/2021)  ? fluticasone (FLONASE) 50 MCG/ACT nasal spray Place 1 spray into both nostrils daily.  ? gabapentin (NEURONTIN) 100 MG capsule Take 100 mg by mouth daily.  ? glucose blood (CONTOUR NEXT TEST) test strip Use to test blood sugars 4 times daily. Dx:e11.9  ? insulin glargine (LANTUS SOLOSTAR) 100 UNIT/ML Solostar Pen Inject 80 Units into the skin daily.  ? Lancets 30G MISC Use to check blood sugars 4 times daily. Dx:e11.9. please let [place] if  not covered  ? losartan (COZAAR) 50 MG tablet Take 1 tablet (50 mg total) by mouth daily.  ? lubiprostone (AMITIZA) 8 MCG capsule Take 1 capsule (8 mcg total) by mouth 2 (two) times daily with a meal.  ? NON [place] ?Peripheral Neuropathy Cream: Bupivacaine 1%; Doxepin 3%; Gabapentin 6%; Pentoxifylline 3%; Topiramate 1% ? ?Faxed over to [facility] - 05/02/19 with 5  refills (Patient not taking: Reported on 07/27/2021)  ? rosuvastatin (CRESTOR) 10 MG tablet Take 1 tablet (10 mg total) by mouth daily.  ? zinc gluconate 50 MG tablet Take 1 tablet (50 mg total) by mouth daily.  ? ?No facility-administered encounter medications on file as of 11/09/2021.  ? ? ?Allergies (verified) ?Ace inhibitors  ? ?History: ?Past Medical History:  ?Diagnosis Date  ? Arthritis   ? Cataract   ? OD  ? Chronic kidney disease   ? Diabetes mellitus 2001  ? Hyperlipidemia Dx 2012  ? Hypertension 2001  ? Lower extremity edema 01/06/2021  ? ?Past Surgical History:  ?Procedure Laterality Date  ? CATARACT EXTRACTION Left   ? EYE SURGERY Left   ? Cat Sx  ? VAGINAL HYSTERECTOMY    ? AUB (surgery in [place]), prior to 2000  ? ?Family History  ?Problem Relation Age of Onset  ? Depression Mother   ? Hypertension Mother   ? Diabetes Mother   ? Heart disease Father   ? Heart attack Father 61  ? Uterine cancer Sister   ? Breast cancer Sister   ? Lung cancer Brother   ? Prostate cancer Neg Hx   ? Endometrial cancer Neg Hx   ? Ovarian cancer Neg Hx   ? Colon cancer Neg Hx   ? Pancreatic cancer Neg Hx   ? ?Social History  ? ?Socioeconomic History  ? Marital status: Divorced  ?  Spouse name: Not on file  ? Number of children: Not on file  ? Years of education: Not on file  ? Highest education level: Not on file  ?Occupational History  ? Occupation: retired  ?Tobacco Use  ? Smoking status: Never  ? Smokeless tobacco: Never  ?Vaping Use  ? Vaping Use: Never used  ?Substance and Sexual Activity  ? Alcohol use: Yes  ?  Comment: occ  ? Drug use: No  ? Sexual activity: Not Currently  ?Other Topics Concern  ? Not on file  ?Social History Narrative  ? Not on file  ? ?Social Determinants of Health  ? ?Financial Resource Strain: Low Risk   ? Difficulty of Paying Living Expenses: Not hard at all  ?Food Insecurity: No Food Insecurity  ? Worried About Charity fundraiser in the Last Year: Never true  ? Ran Out of Food in the Last Year:  Never true  ?Transportation Needs: No Transportation Needs  ? Lack of Transportation (Medical): No  ? Lack of Transportation (Non-Medical): No  ?Physical Activity: Sufficiently Active  ? Days of Exercise per Week: 5 days  ? Minutes of Exercise per Session: 30 min  ?Stress: No Stress Concern Present  ? Feeling of Stress : Not at all  ?Social Connections: Moderately Integrated  ? Frequency of Communication with Friends and Family: More than three times a week  ? Frequency of Social Gatherings with Friends and Family: More than three times a week  ? Attends Religious Services: More than 4 times per year  ? Active Member of Clubs or Organizations: Yes  ? Attends Archivist Meetings:  More than 4 times per year  ? Marital Status: Divorced  ? ? ?Tobacco Counseling ?Counseling given: Not Answered ? ? ?Clinical Intake: ? ?Pre-visit preparation completed: Yes ? ?Pain : No/denies pain ? ?  ? ?BMI - recorded: 25.89 ?Nutritional Risks: None ?Diabetes: Yes ?CBG done?: No ?Did pt. bring in CBG monitor from home?: No ? ?How often do you need to have someone help you when you read instructions, pamphlets, or other written materials from your doctor or pharmacy?: 1 - Never ?What is the last grade level you completed in school?: Bachelor's Degree ? ?Diabetic? yes ? ?Interpreter Needed?: No ? ?Information entered by :: Lisette Abu, LPN. ? ? ?Activities of Daily Living ? ?  11/09/2021  ? 10:04 AM  ?In your present state of health, do you have any difficulty performing the following activities:  ?Hearing? 0  ?Vision? 0  ?Difficulty concentrating or making decisions? 0  ?Walking or climbing stairs? 0  ?Dressing or bathing? 0  ?Doing errands, shopping? 0  ?Preparing Food and eating ? N  ?Using the Toilet? N  ?In the past six months, have you accidently leaked urine? N  ?Do you have problems with loss of bowel control? N  ?Managing your Medications? N  ?Managing your Finances? N  ?Housekeeping or managing your Housekeeping? N   ? ? ?Patient Care Team: ?Janith Lima, MD as PCP - General (Internal Medicine) ?Angelito, Sharma Covert, [place] (Inactive) as Pharmacist (Pharmacist) ?Tomasa Blase, [facility] as Pharmacist (Pharmacist) ? ?Indicate

## 2021-11-09 NOTE — Patient Instructions (Addendum)
Kimberly [name] , ?Thank you for taking time to come for your Medicare Wellness Visit. I appreciate your ongoing commitment to your health goals. Please review the following plan we discussed and let me know if I can assist you in the future.  ? ?Screening recommendations/referrals: ?Colonoscopy: 07/18/2016; due every 10 years ?Mammogram: 04/16/2021; due every 1-2 years ?Bone Density: 04/28/2020; due every 2-5 years (normal results) ?Recommended yearly ophthalmology/optometry visit for glaucoma screening and checkup ?Recommended yearly dental visit for hygiene and checkup ? ?Vaccinations: ?Influenza vaccine: 03/18/2021 ?Pneumococcal vaccine: 02/04/2019, 07/22/2021 ?Tdap vaccine: 04/30/2014; due every 10 years ?Shingles vaccine: never done  or no record on file ?Covid-19: 08/19/2019, 09/09/2019, 05/04/2020 ? ?Advanced directives: No ? ?Conditions/risks identified: Yes; Type II Diabetes ? ?Next appointment: 11/11/2022 at 10:30 a.m.telephone visit with Mignon Pine, Nurse Health Advisor.  If you need to reschedule or cancel, please call [phone]. ? ? ?Preventive Care 68 Years and Older, Female ?Preventive care refers to lifestyle choices and visits with your health care provider that can promote health and wellness. ?What does preventive care include? ?A yearly physical exam. This is also called an annual well check. ?Dental exams once or twice a year. ?Routine eye exams. Ask your health care provider how often you should have your eyes checked. ?Personal lifestyle choices, including: ?Daily care of your teeth and gums. ?Regular physical activity. ?Eating a healthy diet. ?Avoiding tobacco and drug use. ?Limiting alcohol use. ?Practicing safe sex. ?Taking low-dose aspirin every day. ?Taking vitamin and mineral supplements as recommended by your health care provider. ?What happens during an annual well check? ?The services and screenings done by your health care provider during your annual well check will depend on your age, overall  health, lifestyle risk factors, and family history of disease. ?Counseling  ?Your health care provider may ask you questions about your: ?Alcohol use. ?Tobacco use. ?Drug use. ?Emotional well-being. ?Home and relationship well-being. ?Sexual activity. ?Eating habits. ?History of falls. ?Memory and ability to understand (cognition). ?Work and work Statistician. ?Reproductive health. ?Screening  ?You may have the following tests or measurements: ?Height, weight, and BMI. ?Blood pressure. ?Lipid and cholesterol levels. These may be checked every 5 years, or more frequently if you are over 68 years old. ?Skin check. ?Lung cancer screening. You may have this screening every year starting at age 68 if you have a 30-pack-year history of smoking and currently smoke or have quit within the past 15 years. ?Fecal occult blood test (FOBT) of the stool. You may have this test every year starting at age 68. ?Flexible sigmoidoscopy or colonoscopy. You may have a sigmoidoscopy every 5 years or a colonoscopy every 10 years starting at age 2. ?Hepatitis C blood test. ?Hepatitis B blood test. ?Sexually transmitted disease (STD) testing. ?Diabetes screening. This is done by checking your blood sugar (glucose) after you have not eaten for a while (fasting). You may have this done every 1-3 years. ?Bone density scan. This is done to screen for osteoporosis. You may have this done starting at age 68. ?Mammogram. This may be done every 1-2 years. Talk to your health care provider about how often you should have regular mammograms. ?Talk with your health care provider about your test results, treatment options, and if necessary, the need for more tests. ?Vaccines  ?Your health care provider may recommend certain vaccines, such as: ?Influenza vaccine. This is recommended every year. ?Tetanus, diphtheria, and acellular pertussis (Tdap, Td) vaccine. You may need a Td booster every 10 years. ?Zoster vaccine. You may  need this after age  68. ?Pneumococcal 13-valent conjugate (PCV13) vaccine. One dose is recommended after age 68. ?Pneumococcal polysaccharide (PPSV23) vaccine. One dose is recommended after age 68. ?Talk to your health care provider about which screenings and vaccines you need and how often you need them. ?This information is not intended to replace advice given to you by your health care provider. Make sure you discuss any questions you have with your health care provider. ?Document Released: 07/10/2015 Document Revised: 03/02/2016 Document Reviewed: 04/14/2015 ?Elsevier Interactive Patient Education ? 2017 [place]. ? ?Fall Prevention in the Home ?Falls can cause injuries. They can happen to people of all ages. There are many things you can do to make your home safe and to help prevent falls. ?What can I do on the outside of my home? ?Regularly fix the edges of walkways and driveways and fix any cracks. ?Remove anything that might make you trip as you walk through a door, such as a raised step or threshold. ?Trim any bushes or trees on the path to your home. ?Use bright outdoor lighting. ?Clear any walking paths of anything that might make someone trip, such as rocks or tools. ?Regularly check to see if handrails are loose or broken. Make sure that both sides of any steps have handrails. ?Any raised decks and porches should have guardrails on the edges. ?Have any leaves, snow, or ice cleared regularly. ?Use sand or salt on walking paths during winter. ?Clean up any spills in your garage right away. This includes oil or grease spills. ?What can I do in the bathroom? ?Use night lights. ?Install grab bars by the toilet and in the tub and shower. Do not use towel bars as grab bars. ?Use non-skid mats or decals in the tub or shower. ?If you need to sit down in the shower, use a plastic, non-slip stool. ?Keep the floor dry. Clean up any water that spills on the floor as soon as it happens. ?Remove soap buildup in the tub or shower  regularly. ?Attach bath mats securely with double-sided non-slip rug tape. ?Do not have throw rugs and other things on the floor that can make you trip. ?What can I do in the bedroom? ?Use night lights. ?Make sure that you have a light by your bed that is easy to reach. ?Do not use any sheets or blankets that are too big for your bed. They should not hang down onto the floor. ?Have a firm chair that has side arms. You can use this for support while you get dressed. ?Do not have throw rugs and other things on the floor that can make you trip. ?What can I do in the kitchen? ?Clean up any spills right away. ?Avoid walking on wet floors. ?Keep items that you use a lot in easy-to-reach places. ?If you need to reach something above you, use a strong step stool that has a grab bar. ?Keep electrical cords out of the way. ?Do not use floor polish or wax that makes floors slippery. If you must use wax, use non-skid floor wax. ?Do not have throw rugs and other things on the floor that can make you trip. ?What can I do with my stairs? ?Do not leave any items on the stairs. ?Make sure that there are handrails on both sides of the stairs and use them. Fix handrails that are broken or loose. Make sure that handrails are as long as the stairways. ?Check any carpeting to make sure that it is firmly attached  to the stairs. Fix any carpet that is loose or worn. ?Avoid having throw rugs at the top or bottom of the stairs. If you do have throw rugs, attach them to the floor with carpet tape. ?Make sure that you have a light switch at the top of the stairs and the bottom of the stairs. If you do not have them, ask someone to add them for you. ?What else can I do to help prevent falls? ?Wear shoes that: ?Do not have high heels. ?Have rubber bottoms. ?Are comfortable and fit you well. ?Are closed at the toe. Do not wear sandals. ?If you use a stepladder: ?Make sure that it is fully opened. Do not climb a closed stepladder. ?Make sure that  both sides of the stepladder are locked into place. ?Ask someone to hold it for you, if possible. ?Clearly mark and make sure that you can see: ?Any grab bars or handrails. ?First and last steps. ?Where the edge of

## 2021-11-25 ENCOUNTER — Telehealth: Payer: Self-pay | Admitting: Internal Medicine

## 2021-11-25 DIAGNOSIS — H25811 Combined forms of age-related cataract, right eye: Secondary | ICD-10-CM | POA: Diagnosis not present

## 2021-11-25 DIAGNOSIS — H40011 Open angle with borderline findings, low risk, right eye: Secondary | ICD-10-CM | POA: Diagnosis not present

## 2021-11-25 DIAGNOSIS — H35033 Hypertensive retinopathy, bilateral: Secondary | ICD-10-CM | POA: Diagnosis not present

## 2021-11-25 DIAGNOSIS — H524 Presbyopia: Secondary | ICD-10-CM | POA: Diagnosis not present

## 2021-11-25 LAB — HM DIABETES EYE EXAM

## 2021-11-25 NOTE — Telephone Encounter (Signed)
Patient needs Contour Next strips and lancets - please send to [facility] - She will need them soon Patient wants to be removed from device that she was wearing on her arm - does not use anymore

## 2021-11-26 MED ORDER — LANCETS 30G MISC: 12 refills | Status: DC

## 2021-11-26 MED ORDER — CONTOUR NEXT TEST VI STRP
ORAL_STRIP | 12 refills | Status: DC
Start: 2021-11-26 — End: 2022-12-01

## 2021-12-13 DIAGNOSIS — N184 Chronic kidney disease, stage 4 (severe): Secondary | ICD-10-CM | POA: Diagnosis not present

## 2021-12-30 DIAGNOSIS — H5213 Myopia, bilateral: Secondary | ICD-10-CM | POA: Diagnosis not present

## 2021-12-31 DIAGNOSIS — N184 Chronic kidney disease, stage 4 (severe): Secondary | ICD-10-CM | POA: Diagnosis not present

## 2022-01-12 ENCOUNTER — Ambulatory Visit: Payer: Medicare Other | Admitting: Internal Medicine

## 2022-01-15 ENCOUNTER — Other Ambulatory Visit: Payer: Self-pay | Admitting: Internal Medicine

## 2022-01-15 DIAGNOSIS — E785 Hyperlipidemia, unspecified: Secondary | ICD-10-CM

## 2022-01-19 ENCOUNTER — Ambulatory Visit: Payer: Medicare Other | Admitting: Internal Medicine

## 2022-01-30 ENCOUNTER — Other Ambulatory Visit: Payer: Self-pay | Admitting: Internal Medicine

## 2022-01-30 DIAGNOSIS — D538 Other specified nutritional anemias: Secondary | ICD-10-CM

## 2022-02-07 ENCOUNTER — Ambulatory Visit: Payer: Medicare Other | Admitting: Internal Medicine

## 2022-02-09 ENCOUNTER — Encounter: Payer: Self-pay | Admitting: Internal Medicine

## 2022-02-09 ENCOUNTER — Ambulatory Visit (INDEPENDENT_AMBULATORY_CARE_PROVIDER_SITE_OTHER): Payer: Medicare Other | Admitting: Internal Medicine

## 2022-02-09 VITALS — BP 148/72 | HR 78 | Temp 97.8°F | Resp 16 | Ht 71.0 in | Wt 186.0 lb

## 2022-02-09 DIAGNOSIS — E1165 Type 2 diabetes mellitus with hyperglycemia: Secondary | ICD-10-CM | POA: Diagnosis not present

## 2022-02-09 DIAGNOSIS — Z0001 Encounter for general adult medical examination with abnormal findings: Secondary | ICD-10-CM | POA: Diagnosis not present

## 2022-02-09 DIAGNOSIS — R1900 Intra-abdominal and pelvic swelling, mass and lump, unspecified site: Secondary | ICD-10-CM

## 2022-02-09 DIAGNOSIS — E1121 Type 2 diabetes mellitus with diabetic nephropathy: Secondary | ICD-10-CM

## 2022-02-09 DIAGNOSIS — E785 Hyperlipidemia, unspecified: Secondary | ICD-10-CM

## 2022-02-09 DIAGNOSIS — N184 Chronic kidney disease, stage 4 (severe): Secondary | ICD-10-CM

## 2022-02-09 DIAGNOSIS — D538 Other specified nutritional anemias: Secondary | ICD-10-CM | POA: Diagnosis not present

## 2022-02-09 DIAGNOSIS — E1129 Type 2 diabetes mellitus with other diabetic kidney complication: Secondary | ICD-10-CM | POA: Diagnosis not present

## 2022-02-09 DIAGNOSIS — R9431 Abnormal electrocardiogram [ECG] [EKG]: Secondary | ICD-10-CM

## 2022-02-09 DIAGNOSIS — I10 Essential (primary) hypertension: Secondary | ICD-10-CM | POA: Diagnosis not present

## 2022-02-09 DIAGNOSIS — N838 Other noninflammatory disorders of ovary, fallopian tube and broad ligament: Secondary | ICD-10-CM

## 2022-02-09 LAB — LIPID PANEL
Cholesterol: 179 mg/dL (ref 0–200)
HDL: 41.4 mg/dL (ref >39.00)
NonHDL: 137.77
Total CHOL/HDL Ratio: 4
Triglycerides: 202 mg/dL — ABNORMAL HIGH (ref 0.0–149.0)
VLDL: 40.4 mg/dL — ABNORMAL HIGH (ref 0.0–40.0)

## 2022-02-09 LAB — URINALYSIS, ROUTINE W REFLEX MICROSCOPIC
Bilirubin Urine: NEGATIVE
Ketones, ur: NEGATIVE
Leukocytes,Ua: NEGATIVE
Nitrite: NEGATIVE
Specific Gravity, Urine: <=1.005 — AB (ref 1.000–1.030)
Total Protein, Urine: NEGATIVE
Urine Glucose: NEGATIVE
Urobilinogen, UA: 0.2 (ref 0.0–1.0)
WBC, UA: NONE SEEN (ref 0–?)
pH: 5.5 (ref 5.0–8.0)

## 2022-02-09 LAB — BASIC METABOLIC PANEL
BUN: 49 mg/dL — ABNORMAL HIGH (ref 6–23)
CO2: 26 mEq/L (ref 19–32)
Calcium: 9.7 mg/dL (ref 8.4–10.5)
Chloride: 102 mEq/L (ref 96–112)
Creatinine, Ser: 2.09 mg/dL — ABNORMAL HIGH (ref 0.40–1.20)
GFR: 23.93 mL/min — ABNORMAL LOW (ref >60.00)
Glucose, Bld: 91 mg/dL (ref 70–99)
Potassium: 4.1 mEq/L (ref 3.5–5.1)
Sodium: 137 mEq/L (ref 135–145)

## 2022-02-09 LAB — CBC WITH DIFFERENTIAL/PLATELET
Basophils Absolute: 0 10*3/uL (ref 0.0–0.1)
Basophils Relative: 0.4 % (ref 0.0–3.0)
Eosinophils Absolute: 0.3 10*3/uL (ref 0.0–0.7)
Eosinophils Relative: 4.3 % (ref 0.0–5.0)
HCT: 35.1 % — ABNORMAL LOW (ref 36.0–46.0)
Hemoglobin: 11.5 g/dL — ABNORMAL LOW (ref 12.0–15.0)
Lymphocytes Relative: 26.6 % (ref 12.0–46.0)
Lymphs Abs: 1.6 10*3/uL (ref 0.7–4.0)
MCHC: 32.7 g/dL (ref 30.0–36.0)
MCV: 87.2 fl (ref 78.0–100.0)
Monocytes Absolute: 0.6 10*3/uL (ref 0.1–1.0)
Monocytes Relative: 10.2 % (ref 3.0–12.0)
Neutro Abs: 3.6 10*3/uL (ref 1.4–7.7)
Neutrophils Relative %: 58.5 % (ref 43.0–77.0)
Platelets: 190 10*3/uL (ref 150.0–400.0)
RBC: 4.02 Mil/uL (ref 3.87–5.11)
RDW: 14.1 % (ref 11.5–15.5)
WBC: 6.2 10*3/uL (ref 4.0–10.5)

## 2022-02-09 LAB — MICROALBUMIN / CREATININE URINE RATIO
Creatinine,U: 43.3 mg/dL
Microalb Creat Ratio: 28.5 mg/g (ref 0.0–30.0)
Microalb, Ur: 12.3 mg/dL — ABNORMAL HIGH (ref 0.0–1.9)

## 2022-02-09 LAB — TROPONIN I (HIGH SENSITIVITY): High Sens Troponin I: 7 ng/L (ref 2–17)

## 2022-02-09 LAB — LDL CHOLESTEROL, DIRECT: Direct LDL: 94 mg/dL

## 2022-02-09 LAB — HEMOGLOBIN A1C: Hgb A1c MFr Bld: 8.3 % — ABNORMAL HIGH (ref 4.6–6.5)

## 2022-02-09 MED ORDER — RYBELSUS 3 MG PO TABS: 3.0000 mg | ORAL_TABLET | Freq: Every day | ORAL | 0 refills | Status: DC

## 2022-02-09 MED ORDER — AMLODIPINE BESYLATE 10 MG PO TABS: 10.0000 mg | ORAL_TABLET | Freq: Every day | ORAL | 0 refills | Status: DC

## 2022-02-09 NOTE — Patient Instructions (Signed)

## 2022-02-09 NOTE — Progress Notes (Unsigned)
 Subjective:  Patient ID: Kimberly [name], female    DOB: 01/31/1956  Age: 66 y.o. MRN: [id number]  CC: Abdominal Pain and Annual Exam   HPI Kimberly [name] presents for a CPX and f/up -   She continues to complain of abdominal pain predominantly on the left side.  She describes it as contractions.  She saw GYN oncologist about 5 months ago and was found to have a right ovarian mass.  She was asked to have a repeat MRI 2 months ago but that has not been completed.  She continues to complain of fatigue. She is active and exercises and does not experience chest pain, shortness of breath, diaphoresis, or edema.  According to prescription refills she is not currently taking amlodipine.  Outpatient Medications Prior to Visit  Medication Sig Dispense Refill   albuterol (VENTOLIN HFA) 108 (90 Base) MCG/ACT inhaler Inhale 2 puffs into the lungs every 4 (four) hours as needed.     BD PEN NEEDLE NANO U/F 32G X 4 MM MISC USE AS DIRECTED ONCE DAILY 100 each 4   Blood Glucose Monitoring Suppl (CONTOUR NEXT MONITOR) w/Device KIT Use to test blood sugars 4 times daily. Dx: e11.9 1 kit 1   budesonide-formoterol (SYMBICORT) 160-4.5 MCG/ACT inhaler INHALE TWO PUFFS BY MOUTH TWICE A DAY 10.2 g 1   cetirizine (ZYRTEC) 10 MG tablet Take 1 tablet (10 mg total) by mouth daily. 30 tablet 0   chlorthalidone (HYGROTON) 25 MG tablet Take 1 tablet (25 mg total) by mouth daily. 90 tablet 3   fluticasone (FLONASE) 50 MCG/ACT nasal spray Place 1 spray into both nostrils daily. 16 g 0   glucose blood (CONTOUR NEXT TEST) test strip Use to test blood sugars 4 times daily. Dx:e11.9 400 each 12   insulin glargine (LANTUS SOLOSTAR) 100 UNIT/ML Solostar Pen Inject 80 Units into the skin daily. 72 mL 1   Lancets 30G MISC Use to check blood sugars 4 times daily. Dx:e11.9. please let us know if not covered 400 each 12   losartan (COZAAR) 50 MG tablet Take 1 tablet (50 mg total) by mouth daily. 90 tablet 3   lubiprostone  (AMITIZA) 8 MCG capsule Take 1 capsule (8 mcg total) by mouth 2 (two) times daily with a meal. 180 capsule 1   NON FORMULARY [facility] Peripheral Neuropathy Cream: Bupivacaine 1%; Doxepin 3%; Gabapentin 6%; Pentoxifylline 3%; Topiramate 1%  Faxed over to [facility] - 05/02/19 with 5 refills     rosuvastatin (CRESTOR) 10 MG tablet TAKE ONE TABLET BY MOUTH DAILY 90 tablet 0   zinc gluconate 50 MG tablet TAKE 1 TABLET BY MOUTH DAILY 90 tablet 1   amLODipine (NORVASC) 10 MG tablet TAKE ONE TABLET BY MOUTH DAILY 90 tablet 2   Finerenone (KERENDIA) 10 MG TABS Take by mouth.     gabapentin (NEURONTIN) 100 MG capsule Take 100 mg by mouth daily.     No facility-administered medications prior to visit.    ROS Review of Systems  Constitutional:  Positive for fatigue. Negative for appetite change, chills, diaphoresis and unexpected weight change.  HENT: Negative.    Eyes: Negative.   Respiratory: Negative.  Negative for cough, shortness of breath and wheezing.   Cardiovascular:  Negative for chest pain, palpitations and leg swelling.  Gastrointestinal:  Positive for abdominal pain. Negative for constipation, diarrhea, nausea and vomiting.  Endocrine: Negative.   Genitourinary: Negative.  Negative for difficulty urinating.  Musculoskeletal:  Negative for arthralgias and myalgias.    Skin: Negative.  Negative for color change.  Neurological:  Negative for dizziness and weakness.  Hematological:  Negative for adenopathy. Does not bruise/bleed easily.  Psychiatric/Behavioral: Negative.      Objective:  BP (!) 148/72 (BP Location: Right Arm, Patient Position: Sitting, Cuff Size: Large)   Pulse 78   Temp 97.8 F (36.6 C) (Oral)   Resp 16   Ht 5' 11" (1.803 m)   Wt 186 lb (84.4 kg)   SpO2 91%   BMI 25.94 kg/m   BP Readings from Last 3 Encounters:  02/09/22 (!) 148/72  08/26/21 138/63  07/22/21 134/68    Wt Readings from Last 3 Encounters:  02/09/22 186 lb (84.4 kg)   08/26/21 187 lb (84.8 kg)  07/22/21 182 lb (82.6 kg)    Physical Exam Vitals reviewed.  Constitutional:      Appearance: She is not ill-appearing.  HENT:     Nose: Nose normal.     Mouth/Throat:     Mouth: Mucous membranes are moist.  Eyes:     General: No scleral icterus.    Conjunctiva/sclera: Conjunctivae normal.  Cardiovascular:     Rate and Rhythm: Normal rate and regular rhythm.     Heart sounds: Normal heart sounds, S1 normal and S2 normal. No murmur heard.    No gallop.     Comments: EKG- NSR, 80 bpm Septal infract pattern - old +LVH No ST/T wave changes Pulmonary:     Effort: Pulmonary effort is normal.     Breath sounds: No stridor. No wheezing, rhonchi or rales.  Abdominal:     General: Abdomen is protuberant. Bowel sounds are normal. There is no distension.     Palpations: Abdomen is soft. There is no hepatomegaly, splenomegaly or mass.     Tenderness: There is no abdominal tenderness. There is no guarding or rebound. Negative signs include Murphy's sign.  Musculoskeletal:     Cervical back: Neck supple.     Right lower leg: No edema.     Left lower leg: No edema.  Lymphadenopathy:     Cervical: No cervical adenopathy.  Skin:    General: Skin is warm and dry.  Neurological:     General: No focal deficit present.     Mental Status: She is alert. Mental status is at baseline.  Psychiatric:        Mood and Affect: Mood normal.        Behavior: Behavior normal.     Lab Results  Component Value Date   WBC 6.2 02/09/2022   HGB 11.5 (L) 02/09/2022   HCT 35.1 (L) 02/09/2022   PLT 190.0 02/09/2022   GLUCOSE 91 02/09/2022   CHOL 179 02/09/2022   TRIG 202.0 (H) 02/09/2022   HDL 41.40 02/09/2022   LDLDIRECT 94.0 02/09/2022   LDLCALC 138 (H) 01/28/2021   ALT 15 07/22/2021   AST 20 07/22/2021   NA 137 02/09/2022   K 4.1 02/09/2022   CL 102 02/09/2022   CREATININE 2.09 (H) 02/09/2022   BUN 49 (H) 02/09/2022   CO2 26 02/09/2022   TSH 1.88 07/22/2021    HGBA1C 8.3 (H) 02/09/2022   MICROALBUR 12.3 (H) 02/09/2022    MR Pelvis W Wo Contrast  Result Date: 08/29/2021 CLINICAL DATA:  Right adnexal cyst EXAM: MRI PELVIS WITHOUT AND WITH CONTRAST TECHNIQUE: Multiplanar multisequence MR imaging of the pelvis was performed both before and after administration of intravenous contrast. CONTRAST:  23m GADAVIST GADOBUTROL 1 MMOL/ML IV SOLN COMPARISON:  CT  abdomen pelvis, 12/16/2020 FINDINGS: Urinary Tract:  No abnormality visualized. Bowel:  Sigmoid diverticula. Vascular/Lymphatic: No pathologically enlarged lymph nodes. No significant vascular abnormality seen. Reproductive: Status post hysterectomy. Thinly septated fluid signal cysts of the right ovary measuring 5.3 x 3.3 cm (series 3, image 18) and 2.8 x 2.0 cm (series 3, image 20). No associated solid component or contrast enhancement. Other:  None. Musculoskeletal: No suspicious bone lesions identified. IMPRESSION: 1. Thinly septated fluid signal cysts of the right ovary measuring 5.3 cm and 2.8 cm. No associated solid component or contrast enhancement. Given initially established stability these are almost certainly benign. However given size and postmenopausal status, consider additional ultrasound follow-up in 1-2 years to ensure long-term stability. 2. Status post hysterectomy. 3. Sigmoid diverticula. Electronically Signed   By: Alex D Bibbey M.D.   On: 08/29/2021 17:54    Assessment & Plan:   Talena was seen today for abdominal pain and annual exam.  Diagnoses and all orders for this visit:  Essential hypertension- Her blood pressure is not adequately well controlled.  Will restart amlodipine. -     Basic metabolic panel; Future -     Urinalysis, Routine w reflex microscopic; Future -     amLODipine (NORVASC) 10 MG tablet; Take 1 tablet (10 mg total) by mouth daily. -     Urinalysis, Routine w reflex microscopic -     Basic metabolic panel  Poorly controlled type II diabetes mellitus with renal  complication (HCC) - Her A1c remains too high.  Will add a GLP-1 agonist. -     Basic metabolic panel; Future -     Microalbumin / creatinine urine ratio; Future -     Hemoglobin A1c; Future -     Hemoglobin A1c -     Microalbumin / creatinine urine ratio -     Basic metabolic panel -     Semaglutide (RYBELSUS) 3 MG TABS; Take 3 mg by mouth daily.  Diabetic nephropathy associated with type 2 diabetes mellitus (HCC) -     Microalbumin / creatinine urine ratio; Future -     Urinalysis, Routine w reflex microscopic; Future -     Urinalysis, Routine w reflex microscopic -     Microalbumin / creatinine urine ratio  CKD (chronic kidney disease), stage IV (HCC)- -     Microalbumin / creatinine urine ratio; Future -     Urinalysis, Routine w reflex microscopic; Future -     Urinalysis, Routine w reflex microscopic -     Microalbumin / creatinine urine ratio -     Ambulatory referral to Nephrology  Hyperlipidemia LDL goal <100- LDL goal achieved. Doing well on the statin  -     Lipid panel; Future -     Lipid panel  Anemia due to zinc deficiency- She is still anemic.  Will evaluate for other vitamin deficiencies. -     CBC with Differential/Platelet; Future -     CBC with Differential/Platelet  Encounter for general adult medical examination with abnormal findings- Exam completed, labs reviewed, vaccines reviewed and updated, cancer screenings are up-to-date, patient education was given.  Ovarian mass, right- I referred her back to her gynecologist but they have requested that I repeat the MRI.   -     Ambulatory referral to Gynecology -     MR Abdomen W Wo Contrast; Future  Abnormal electrocardiogram (ECG) (EKG)- She sees cardiology later this month. -     Cancel: MYOCARDIAL PERFUSION IMAGING; Future -       Troponin I (High Sensitivity); Future -     Troponin I (High Sensitivity)  Intra-abdominal and pelvic swelling, mass and lump, unspecified site -     MR Abdomen W Wo Contrast;  Future  Other orders -     LDL cholesterol, direct   I have discontinued Kimberly [name]'s gabapentin and [place]. I have also changed her amLODipine. Additionally, I am having her start on Rybelsus and Shingrix. Lastly, I am having her maintain her NON FORMULARY, Contour Next Monitor, cetirizine, fluticasone, budesonide-formoterol, BD Pen Needle Nano U/F, albuterol, losartan, chlorthalidone, lubiprostone, Lantus SoloStar, Contour Next Test, Lancets 30G, rosuvastatin, and zinc gluconate.  Meds ordered this encounter  Medications   amLODipine (NORVASC) 10 MG tablet    Sig: Take 1 tablet (10 mg total) by mouth daily.    Dispense:  90 tablet    Refill:  0   Semaglutide (RYBELSUS) 3 MG TABS    Sig: Take 3 mg by mouth daily.    Dispense:  30 tablet    Refill:  0   Zoster Vaccine Adjuvanted [facility]) injection    Sig: Inject 0.5 mLs into the muscle once for 1 dose.    Dispense:  0.5 mL    Refill:  1   In addition to time spent on CPE, I spent 40 minutes in preparing to see the patient by review of recent labs and images, obtaining and reviewing separately obtained history, communicating with the patient, ordering medications, EKG, and MRI, and documenting clinical information in the EHR including the differential Dx, treatment, and any further evaluation and management of multiple complex medical issues.     Follow-up: Return in about 3 months (around 05/12/2022).  Scarlette Calico, MD

## 2022-02-10 ENCOUNTER — Encounter: Payer: Self-pay | Admitting: Internal Medicine

## 2022-02-14 ENCOUNTER — Telehealth: Payer: Self-pay | Admitting: *Deleted

## 2022-02-14 ENCOUNTER — Telehealth: Payer: Self-pay

## 2022-02-14 MED ORDER — SHINGRIX 50 MCG/0.5ML IM SUSR: 0.5000 mL | Freq: Once | INTRAMUSCULAR | 1 refills | Status: AC

## 2022-02-14 NOTE — Telephone Encounter (Signed)
Called Dr Ronnald Ramp office and spoke with his Ju[place], explained that per Dr Berline Lopes she is recommending updted imagining since the patient's pain is on the left and the mass is on the right

## 2022-02-14 NOTE — Telephone Encounter (Signed)
GYN called to discuss referral entered.  They recommend that PCP order additional imaging due to pt's pain being on the left and the mass is located on the right. Please advise.

## 2022-02-16 NOTE — Addendum Note (Signed)
Addended by: Hinda Kehr on: 02/16/2022 11:35 AM   Modules accepted: Orders

## 2022-02-17 ENCOUNTER — Other Ambulatory Visit: Payer: Medicare Other

## 2022-02-23 ENCOUNTER — Ambulatory Visit (HOSPITAL_BASED_OUTPATIENT_CLINIC_OR_DEPARTMENT_OTHER): Payer: Medicare Other | Admitting: Cardiovascular Disease

## 2022-03-01 ENCOUNTER — Other Ambulatory Visit: Payer: Medicare Other

## 2022-03-01 ENCOUNTER — Ambulatory Visit
Admission: RE | Admit: 2022-03-01 | Discharge: 2022-03-01 | Disposition: A | Discharge status: Home / Self Care | Payer: Medicare Other | Source: Ambulatory Visit | Attending: Internal Medicine | Admitting: Internal Medicine

## 2022-03-01 ENCOUNTER — Other Ambulatory Visit: Payer: Self-pay | Admitting: Internal Medicine

## 2022-03-01 DIAGNOSIS — N838 Other noninflammatory disorders of ovary, fallopian tube and broad ligament: Secondary | ICD-10-CM

## 2022-03-01 DIAGNOSIS — R1900 Intra-abdominal and pelvic swelling, mass and lump, unspecified site: Secondary | ICD-10-CM | POA: Diagnosis not present

## 2022-03-01 MED ORDER — GADOPICLENOL 0.5 MMOL/ML IV SOLN
8.0000 mL | Freq: Once | INTRAVENOUS | Status: AC | PRN
  Administered 2022-03-01: 8 mL via INTRAVENOUS

## 2022-03-02 ENCOUNTER — Telehealth: Payer: Self-pay

## 2022-03-02 NOTE — Telephone Encounter (Signed)
Key: [id number]

## 2022-03-02 NOTE — Telephone Encounter (Signed)
Approved Effective from 03/02/2022 through 03/03/2023

## 2022-03-03 ENCOUNTER — Telehealth: Payer: Self-pay | Admitting: Internal Medicine

## 2022-03-03 NOTE — Telephone Encounter (Signed)
Patient wants someone to call her today to explain her MRI results

## 2022-03-07 ENCOUNTER — Telehealth: Payer: Self-pay | Admitting: *Deleted

## 2022-03-07 NOTE — Patient Outreach (Signed)
  Care Coordination   03/07/2022 Name: Kimberly [name] MRN: [id number] DOB: 09-22-1955   Care Coordination Outreach Attempts:  An unsuccessful telephone outreach was attempted today to offer the patient information about available care coordination services as a benefit of their health plan.   Follow Up Plan:  Additional outreach attempts will be made to offer the patient care coordination information and services.   Encounter Outcome:  No Answer  Care Coordination Interventions Activated:  No   Care Coordination Interventions:  No, not indicated    Raina Mina, RN Care Management Coordinator [place] Office [phone]

## 2022-03-15 ENCOUNTER — Ambulatory Visit (HOSPITAL_BASED_OUTPATIENT_CLINIC_OR_DEPARTMENT_OTHER): Payer: Medicare Other | Admitting: Cardiovascular Disease

## 2022-03-15 ENCOUNTER — Encounter (HOSPITAL_BASED_OUTPATIENT_CLINIC_OR_DEPARTMENT_OTHER): Payer: Self-pay | Admitting: Cardiovascular Disease

## 2022-03-15 VITALS — BP 130/58 | HR 67 | Ht 71.0 in | Wt 186.5 lb

## 2022-03-15 DIAGNOSIS — R0989 Other specified symptoms and signs involving the circulatory and respiratory systems: Secondary | ICD-10-CM

## 2022-03-15 DIAGNOSIS — I10 Essential (primary) hypertension: Secondary | ICD-10-CM | POA: Diagnosis not present

## 2022-03-15 DIAGNOSIS — E785 Hyperlipidemia, unspecified: Secondary | ICD-10-CM

## 2022-03-15 MED ORDER — ALBUTEROL SULFATE HFA 108 (90 BASE) MCG/ACT IN AERS: 2.0000 | INHALATION_SPRAY | RESPIRATORY_TRACT | 0 refills | Status: DC | PRN

## 2022-03-15 NOTE — Progress Notes (Signed)
Cardiology Office Note  Date:  03/21/2022   ID:  Kimberly, [name] Jun 13, 1956, MRN [id number]  PCP:  Janith Lima, MD  Cardiologist:   Skeet Latch, MD   No chief complaint on file.   History of Present Illness: Kimberly [name] is a 66 y.o. female with hypertension, hyperlipidemia, GERD, CKD 4, diabetes, and cough-variant asthma here for follow up.  She was initially seen  09/2019 for the evaluation of chest pain shortness of breath.  She saw Dr. Martinique 09/2019 and was noted to have a murmur on exam.  She was referred to cardiology for further evaluation.  She reported some intermittent pain in her left arm that has been occurring for several weeks.  It happens at rest or when laying in the bed and last for a few seconds at a time.  Additionally, she notes some chest tightness that occur separately.  She thinks this is related to stress.  She was referred for an ETT 11/14/2019 that was negative for ischemia.  She achieved 8.8 metabolic equivalents.  Her blood pressure increased to 227/63.  She was started on amlodipine.  She checks her BP at home and it runs around 145.  She is tolerating amlodipine well but doesn't want to add any other medication.  At her initial visit, she increased her pravastatin to for 5 days/week, though she did sometimes skip it because it caused muscle aches. Her lipids were not at goal but she was hesitant to increase her pravastatin. She wanted to focus on diet and exercise.    She complained of pain on her L side and bilateral LE edema. The pain radiated from her lower left side/thigh down to her left foot. She went to the ED 12/16/2020 because her symptoms all occurred on her left side. CT scan showed a possible ovarian cyst. She was following her OBGYN who performed an [place] in her office. Around 11/25/2020, she was infected with COVID-19. She noted her blood pressures were high at home but controlled in the office . She was asked to bring blood pressure cuff to  follow up. She reported LE swelling but doppler 12/2020 was negative for DVT. She saw her nephrologist and was started on losartan.  At last appointment she was feeling well but her blood pressure was uncontrolled so losartan was increased, and HCTZ was switched to chlorthalidone. She wanted to stop the statin, but agreed to take it once per week. She wasn't interested in starting Repatha. She was started on Nexlizet. She saw her PCP the following month and her blood pressure was controlled, however it was high at follow up 01/2022 so amlodipine was restarted.   She has been very stressed lately. In order to help with the stress she has been going to the gym 3 times a week. She has also been working for [employer] a a bit to help her get out of the house. She feels good while exercising but does sometimes experience pain in her left side but mostly pain radiating across her abdomen. She will not see the cancer doctor until January 4th. Her blood glucose this morning was 86 and her blood pressure at home has been similar to what was seen in office today. BP in office was 138/66. At times she does have swelling in her feet, but it is more in the areas around her joints. She does have a history of arthritis and gout. She is still taking the rosuvastatin once daily in the  morning and has been tolerating it well. Her other doctor had prescribed her Rybelsus for her diabetes but she has been concerned about starting it because her blood glucose is usually low in the morning. Of note, she self-discontinued her Nexlizet after two weeks due to developing severe leg cramps.  She denies any palpitations, chest pain, shortness of breath. No lightheadedness, headaches, syncope, orthopnea, or PND.    Past Medical History:  Diagnosis Date   Arthritis    Carotid bruit 03/21/2022   Cataract    OD   Chronic kidney disease    Diabetes mellitus 2001   Hyperlipidemia Dx 2012   Lower extremity edema 01/06/2021    Past  Surgical History:  Procedure Laterality Date   CATARACT EXTRACTION Left    EYE SURGERY Left    Cat Sx   VAGINAL HYSTERECTOMY     AUB (surgery in [place]), prior to 2000     Current Outpatient Medications  Medication Sig Dispense Refill   amLODipine (NORVASC) 10 MG tablet Take 1 tablet (10 mg total) by mouth daily. 90 tablet 0   aspirin EC 81 MG tablet Take 81 mg by mouth daily. Swallow whole.     BD PEN NEEDLE NANO U/F 32G X 4 MM MISC USE AS DIRECTED ONCE DAILY 100 each 4   Blood Glucose Monitoring Suppl (CONTOUR NEXT MONITOR) w/Device KIT Use to test blood sugars 4 times daily. Dx: e11.9 1 kit 1   cetirizine (ZYRTEC) 10 MG tablet Take 1 tablet (10 mg total) by mouth daily. 30 tablet 0   chlorthalidone (HYGROTON) 25 MG tablet Take 1 tablet (25 mg total) by mouth daily. 90 tablet 3   fluticasone (FLONASE) 50 MCG/ACT nasal spray Place 1 spray into both nostrils daily. 16 g 0   glucose blood (CONTOUR NEXT TEST) test strip Use to test blood sugars 4 times daily. Dx:e11.9 400 each 12   insulin glargine (LANTUS SOLOSTAR) 100 UNIT/ML Solostar Pen Inject 80 Units into the skin daily. 72 mL 1   KERENDIA 10 MG TABS Take 1 tablet by mouth daily at 12 noon.     Lancets 30G MISC Use to check blood sugars 4 times daily. Dx:e11.9. please let us know if not covered 400 each 12   losartan (COZAAR) 50 MG tablet Take 1 tablet (50 mg total) by mouth daily. 90 tablet 3   NON FORMULARY [facility] Peripheral Neuropathy Cream: Bupivacaine 1%; Doxepin 3%; Gabapentin 6%; Pentoxifylline 3%; Topiramate 1%  Faxed over to [facility] - 05/02/19 with 5 refills     rosuvastatin (CRESTOR) 10 MG tablet TAKE ONE TABLET BY MOUTH DAILY 90 tablet 0   Semaglutide (RYBELSUS) 3 MG TABS Take 3 mg by mouth daily. 30 tablet 0   zinc gluconate 50 MG tablet TAKE 1 TABLET BY MOUTH DAILY 90 tablet 1   albuterol (VENTOLIN HFA) 108 (90 Base) MCG/ACT inhaler Inhale 2 puffs into the lungs every 4 (four) hours as needed.  ADDITIONAL REFILLS FROM PCP 1 each 0   No current facility-administered medications for this visit.    Allergies:   Ace inhibitors    Social History:  The patient  reports that she has never smoked. She has never used smokeless tobacco. She reports current alcohol use of about 4.0 standard drinks of alcohol per week. She reports that she does not use drugs.   Family History:  The patient's family history includes Breast cancer in her sister; Depression in her mother; Diabetes in her mother; Heart attack (  age of onset: 59) in her father; Heart disease in her father; Hypertension in her mother; Lung cancer in her brother; Uterine cancer in her sister.    ROS:   Please see the history of present illness. (+) pain in upper half of the abdomen (+) arthritis  (+) joint swelling (+) gout All other systems are reviewed and negative.    PHYSICAL EXAM: VS:  BP (!) 130/58 (BP Location: Right Arm, Patient Position: Sitting, Cuff Size: Normal)   Pulse 67   Ht '5\' 11"'  (1.803 m)   Wt 186 lb 8 oz (84.6 kg)   SpO2 95%   BMI 26.01 kg/m  , BMI Body mass index is 26.01 kg/m. GENERAL:  Well appearing HEENT: Pupils equal round and reactive, fundi not visualized, oral mucosa unremarkable NECK:  No jugular venous distention, waveform within normal limits, carotid upstroke brisk and symmetric, right carotid bruit  LUNGS:  Clear to auscultation bilaterally HEART:  RRR.  PMI not displaced or sustained,S1 and S2 within normal limits, no S3, no S4, no clicks, no rubs, no murmurs ABD:  Flat, positive bowel sounds normal in frequency in pitch, no bruits, no rebound, no guarding, no midline pulsatile mass, no hepatomegaly, no splenomegaly EXT:  2 plus pulses throughout, no edema, no cyanosis no clubbing.  SKIN:  No rashes no nodules NEURO:  Cranial nerves II through XII grossly intact, motor grossly intact throughout PSYCH:  Cognitively intact, oriented to person place and time   EKG:  EKG is personally  reviewed.  03/15/2022: EKG was not ordered.  05/31/2021: EKG was not ordered  01/06/2021: NSR, 81, inferiorly T wave inversions 10/25/2019: sinus rhythm.  Rate 96 bpm.  LAFB.  LVH with repolarization abnormalities  Lower Venous DVT 01/06/21 RIGHT:  - No evidence of common femoral vein obstruction.  LEFT:  - No evidence of deep vein thrombosis in the lower extremity. No indirect  evidence of obstruction proximal to the inguinal ligament.  - No cystic structure found in the popliteal fossa.   ETT 11/14/19: There was no ST segment deviation noted during stress. No evidence of ischemia Hypertensive response to exercise, peak BP 227/63  Recent Labs: 07/22/2021: ALT 15; TSH 1.88 02/09/2022: BUN 49; Creatinine, Ser 2.09; Hemoglobin 11.5; Platelets 190.0; Potassium 4.1; Sodium 137    Lipid Panel    Component Value Date/Time   CHOL 179 02/09/2022 1538   CHOL 212 (H) 01/28/2021 1006   TRIG 202.0 (H) 02/09/2022 1538   HDL 41.40 02/09/2022 1538   HDL 45 01/28/2021 1006   CHOLHDL 4 02/09/2022 1538   VLDL 40.4 (H) 02/09/2022 1538   LDLCALC 138 (H) 01/28/2021 1006   LDLCALC 145 (H) 02/17/2020 0949   LDLDIRECT 94.0 02/09/2022 1538      Wt Readings from Last 3 Encounters:  03/15/22 186 lb 8 oz (84.6 kg)  02/09/22 186 lb (84.4 kg)  08/26/21 187 lb (84.8 kg)      ASSESSMENT AND PLAN:  Essential hypertension Blood pressure goal is less than 130/80.  It was higher in the office today but better on repeat.  She thinks this is because she is been under stress lately and that it is better controlled at home.  Continue to track blood pressure at home.  Continue amlodipine, chlorthalidone, and losartan.  Continue with exercise with a goal of at least 150 minutes weekly.  Carotid bruit Bruit noted on exam.  We will get carotid Dopplers.  Start aspirin 81 mg.  LDL goal is less than 70.  Hyperlipidemia LDL goal <100 LDL goal less than 70.  She has aortic atherosclerosis.  I will continue statin.   If lipids remain above goal at follow-up would recommend increasing her dose of rosuvastatin.   Current medicines are reviewed at length with the patient today.  The patient does not have concerns regarding medicines.  The following changes have been made:  no change  Labs/ tests ordered today include:   Orders Placed This Encounter  Procedures   VAS US CAROTID     Disposition:   FU with Treg Diemer C. Oval Linsey, MD, [facility] in 6 months    I,Jessica Ford,acting as a Education administrator for Skeet Latch, MD.,have documented all relevant documentation on the behalf of Skeet Latch, MD,as directed by  Skeet Latch, MD while in the presence of Skeet Latch, MD.   I, Tyro Oval Linsey, MD have reviewed all documentation for this visit.  The documentation of the exam, diagnosis, procedures, and orders on 03/21/2022 are all accurate and complete.   Signed, Graciana Sessa C. Oval Linsey, MD, [facility]  03/21/2022 12:27 PM    [facility]

## 2022-03-15 NOTE — Patient Instructions (Signed)
Medication Instructions:  START ASPIRIN 81 MG DAILY   *If you need a refill on your cardiac medications before your next appointment, please call your pharmacy*  Lab Work: NONE  Testing/Procedures: Your physician has requested that you have a carotid duplex. This test is an ultrasound of the carotid arteries in your neck. It looks at blood flow through these arteries that supply the brain with blood. Allow one hour for this exam. There are no restrictions or special instructions.  Follow-Up: At [facility], you and your health needs are our priority.  As part of our continuing mission to provide you with exceptional heart care, we have created designated Provider Care Teams.  These Care Teams include your primary Cardiologist (physician) and Advanced Practice Providers (APPs -  Physician Assistants and Nurse Practitioners) who all work together to provide you with the care you need, when you need it.  We recommend signing up for the patient portal called "MyChart".  Sign up information is provided on this After Visit Summary.  MyChart is used to connect with patients for Virtual Visits (Telemedicine).  Patients are able to view lab/test results, encounter notes, upcoming appointments, etc.  Non-urgent messages can be sent to your provider as well.   To learn more about what you can do with MyChart, go to [email or web address].    Your next appointment:   6 month(s)  The format for your next appointment:   In Person  Provider:   Skeet Latch, MD

## 2022-03-21 ENCOUNTER — Encounter (HOSPITAL_BASED_OUTPATIENT_CLINIC_OR_DEPARTMENT_OTHER): Payer: Self-pay | Admitting: Cardiovascular Disease

## 2022-03-21 DIAGNOSIS — R0989 Other specified symptoms and signs involving the circulatory and respiratory systems: Secondary | ICD-10-CM | POA: Insufficient documentation

## 2022-03-21 HISTORY — DX: Other specified symptoms and signs involving the circulatory and respiratory systems: R09.89

## 2022-03-21 NOTE — Assessment & Plan Note (Signed)
LDL goal less than 70.  She has aortic atherosclerosis.  I will continue statin.  If lipids remain above goal at follow-up would recommend increasing her dose of rosuvastatin.

## 2022-03-21 NOTE — Assessment & Plan Note (Signed)
Bruit noted on exam.  We will get carotid Dopplers.  Start aspirin 81 mg.  LDL goal is less than 70.

## 2022-03-21 NOTE — Assessment & Plan Note (Addendum)
Blood pressure goal is less than 130/80.  It was higher in the office today but better on repeat.  She thinks this is because she is been under stress lately and that it is better controlled at home.  Continue to track blood pressure at home.  Continue amlodipine, chlorthalidone, and losartan.  Continue with exercise with a goal of at least 150 minutes weekly.

## 2022-03-25 ENCOUNTER — Telehealth: Payer: Self-pay | Admitting: *Deleted

## 2022-03-25 NOTE — Patient Outreach (Signed)
  Care Coordination   03/25/2022 Name: Kimberly [name] MRN: [id number] DOB: [birth year]   Care Coordination Outreach Attempts:  A second unsuccessful outreach was attempted today to offer the patient with information about available care coordination services as a benefit of their health plan.     Follow Up Plan:  Additional outreach attempts will be made to offer the patient care coordination information and services.   Encounter Outcome:  No Answer  Care Coordination Interventions Activated:  No   Care Coordination Interventions:  No, not indicated    Raina Mina, RN Care Management Coordinator [place] Office [phone]

## 2022-03-31 ENCOUNTER — Telehealth: Payer: Self-pay | Admitting: *Deleted

## 2022-03-31 NOTE — Telephone Encounter (Signed)
Spoke with the patient regarding her referral to Dr Berline Lopes and her MRI of the pelvis. Patient asked about her insurance; explained that our office will reach out to the authorization department

## 2022-04-04 ENCOUNTER — Ambulatory Visit: Payer: Medicare Other | Admitting: Internal Medicine

## 2022-04-05 ENCOUNTER — Ambulatory Visit (INDEPENDENT_AMBULATORY_CARE_PROVIDER_SITE_OTHER): Payer: Medicare Other

## 2022-04-05 DIAGNOSIS — R0989 Other specified symptoms and signs involving the circulatory and respiratory systems: Secondary | ICD-10-CM

## 2022-04-06 ENCOUNTER — Telehealth: Payer: Self-pay

## 2022-04-06 NOTE — Patient Outreach (Signed)
  Care Coordination   04/06/2022 Name: Kimberly [name] MRN: [id number] DOB: 02-17-1956   Care Coordination Outreach Attempts:  A third unsuccessful outreach was attempted today to offer the patient with information about available care coordination services as a benefit of their health plan.   Follow Up Plan:  No further outreach attempts will be made at this time. We have been unable to contact the patient to offer or enroll patient in care coordination services  Encounter Outcome:  No Answer  Care Coordination Interventions Activated:  No   Care Coordination Interventions:  No, not indicated    Peter Garter RN, BSN,CCM, [place] [facility] [phone]

## 2022-04-08 ENCOUNTER — Encounter (HOSPITAL_BASED_OUTPATIENT_CLINIC_OR_DEPARTMENT_OTHER): Payer: Self-pay | Admitting: Cardiovascular Disease

## 2022-04-15 ENCOUNTER — Other Ambulatory Visit: Payer: Self-pay | Admitting: Internal Medicine

## 2022-04-15 DIAGNOSIS — E1165 Type 2 diabetes mellitus with hyperglycemia: Secondary | ICD-10-CM

## 2022-04-15 DIAGNOSIS — E119 Type 2 diabetes mellitus without complications: Secondary | ICD-10-CM

## 2022-04-21 ENCOUNTER — Ambulatory Visit
Admission: RE | Admit: 2022-04-21 | Discharge: 2022-04-21 | Disposition: A | Discharge status: Home / Self Care | Payer: Medicare Other | Source: Ambulatory Visit | Attending: Internal Medicine | Admitting: Internal Medicine

## 2022-04-21 DIAGNOSIS — N838 Other noninflammatory disorders of ovary, fallopian tube and broad ligament: Secondary | ICD-10-CM

## 2022-04-21 MED ORDER — GADOPICLENOL 0.5 MMOL/ML IV SOLN
9.0000 mL | Freq: Once | INTRAVENOUS | Status: AC | PRN
  Administered 2022-04-21: 9 mL via INTRAVENOUS

## 2022-05-02 ENCOUNTER — Telehealth: Payer: Self-pay | Admitting: Surgery

## 2022-05-02 NOTE — Telephone Encounter (Signed)
Called the patient. Discussed MRI. This was ordered by her PCP, not by me. She and I had talked about follow-up imaging after the new year (she is scheduled for an ultrasound in January). Both the adnexal masses look stable in size and character. Overall, my suspicion that either represents cancer is very low. She continue to have upper abdominal pain, no pelvic pain, not associated with eating or anything else that she has noticed. Discussed again options of follow-up imaging (not in January though) versus consideration of surgery - while I think it is less likely pelvic masses are causing upper abdominal pain, there is a possibility that removing them may alleviate her symptoms. She'd like to come in to clinic for a visit in December to discuss further.  Kimberly [name] or Kimberly [name] - please call the patient and schedule visit with me for mid-late December. Please also cancel her January ultrasound. Thank you

## 2022-05-02 NOTE — Telephone Encounter (Signed)
Patient called in, requesting her MRI results. Patient requesting Dr Berline Lopes call her to review results with her. Patient advised Dr Berline Lopes would be notified and our office will follow up with her regarding results.

## 2022-05-03 ENCOUNTER — Other Ambulatory Visit: Payer: Self-pay | Admitting: Internal Medicine

## 2022-05-03 ENCOUNTER — Other Ambulatory Visit: Payer: Self-pay | Admitting: Family Medicine

## 2022-05-03 DIAGNOSIS — E118 Type 2 diabetes mellitus with unspecified complications: Secondary | ICD-10-CM

## 2022-05-03 DIAGNOSIS — N838 Other noninflammatory disorders of ovary, fallopian tube and broad ligament: Secondary | ICD-10-CM

## 2022-05-03 DIAGNOSIS — E1129 Type 2 diabetes mellitus with other diabetic kidney complication: Secondary | ICD-10-CM

## 2022-05-03 MED ORDER — RYBELSUS 7 MG PO TABS: 7.0000 mg | ORAL_TABLET | Freq: Every day | ORAL | 0 refills | Status: DC

## 2022-05-26 ENCOUNTER — Other Ambulatory Visit (HOSPITAL_BASED_OUTPATIENT_CLINIC_OR_DEPARTMENT_OTHER): Payer: Self-pay | Admitting: Cardiovascular Disease

## 2022-05-26 ENCOUNTER — Other Ambulatory Visit: Payer: Self-pay | Admitting: Internal Medicine

## 2022-05-26 DIAGNOSIS — I10 Essential (primary) hypertension: Secondary | ICD-10-CM

## 2022-05-26 NOTE — Telephone Encounter (Signed)
Rx request sent to pharmacy.  

## 2022-06-01 DIAGNOSIS — N184 Chronic kidney disease, stage 4 (severe): Secondary | ICD-10-CM | POA: Diagnosis not present

## 2022-06-01 DIAGNOSIS — D631 Anemia in chronic kidney disease: Secondary | ICD-10-CM | POA: Diagnosis not present

## 2022-06-01 DIAGNOSIS — R809 Proteinuria, unspecified: Secondary | ICD-10-CM | POA: Diagnosis not present

## 2022-06-01 DIAGNOSIS — I129 Hypertensive chronic kidney disease with stage 1 through stage 4 chronic kidney disease, or unspecified chronic kidney disease: Secondary | ICD-10-CM | POA: Diagnosis not present

## 2022-06-01 LAB — BASIC METABOLIC PANEL
BUN: 42 — AB (ref 4–21)
CO2: 25 — AB (ref 13–22)
Chloride: 100 (ref 99–108)
Creatinine: 1.8 — AB (ref 0.5–1.1)
Glucose: 78
Potassium: 3.6 mEq/L (ref 3.5–5.1)
Sodium: 135 — AB (ref 137–147)

## 2022-06-01 LAB — CBC AND DIFFERENTIAL
HCT: 33 — AB (ref 36–46)
Hemoglobin: 10.9 — AB (ref 12.0–16.0)
Platelets: 184 10*3/uL (ref 150–400)
WBC: 6.5

## 2022-06-01 LAB — IRON,TIBC AND FERRITIN PANEL
%SAT: 19
Ferritin: 276
Iron: 58
TIBC: 301

## 2022-06-01 LAB — COMPREHENSIVE METABOLIC PANEL: Calcium: 9.8 (ref 8.7–10.7)

## 2022-06-03 ENCOUNTER — Telehealth: Payer: Self-pay | Admitting: Internal Medicine

## 2022-06-03 NOTE — Telephone Encounter (Signed)
PT calls today in regards to ongoing low blood sugar levels. PT states she has been having fasting blood sugar levels of 60-66 (a couple days down to 57 but not as often) for the past month. States that she is only taking the Semaglutide (RYBELSUS) 7 MG TABS  and reduced her amount of daily insulin taken as well due to low readings. I was able to get PT scheduled with Dr.Jones for his next available and directed her to triage.  CB for PT: [phone]

## 2022-06-08 ENCOUNTER — Ambulatory Visit (INDEPENDENT_AMBULATORY_CARE_PROVIDER_SITE_OTHER): Payer: Medicare Other | Admitting: Internal Medicine

## 2022-06-08 ENCOUNTER — Encounter: Payer: Self-pay | Admitting: Internal Medicine

## 2022-06-08 VITALS — BP 130/74 | HR 82 | Temp 98.0°F | Ht 71.0 in | Wt 186.0 lb

## 2022-06-08 DIAGNOSIS — I10 Essential (primary) hypertension: Secondary | ICD-10-CM

## 2022-06-08 DIAGNOSIS — E118 Type 2 diabetes mellitus with unspecified complications: Secondary | ICD-10-CM

## 2022-06-08 DIAGNOSIS — E1129 Type 2 diabetes mellitus with other diabetic kidney complication: Secondary | ICD-10-CM

## 2022-06-08 DIAGNOSIS — N1832 Chronic kidney disease, stage 3b: Secondary | ICD-10-CM

## 2022-06-08 DIAGNOSIS — E119 Type 2 diabetes mellitus without complications: Secondary | ICD-10-CM | POA: Diagnosis not present

## 2022-06-08 DIAGNOSIS — D631 Anemia in chronic kidney disease: Secondary | ICD-10-CM

## 2022-06-08 DIAGNOSIS — Z23 Encounter for immunization: Secondary | ICD-10-CM | POA: Diagnosis not present

## 2022-06-08 DIAGNOSIS — E1165 Type 2 diabetes mellitus with hyperglycemia: Secondary | ICD-10-CM

## 2022-06-08 DIAGNOSIS — D538 Other specified nutritional anemias: Secondary | ICD-10-CM | POA: Diagnosis not present

## 2022-06-08 DIAGNOSIS — N184 Chronic kidney disease, stage 4 (severe): Secondary | ICD-10-CM

## 2022-06-08 DIAGNOSIS — Z794 Long term (current) use of insulin: Secondary | ICD-10-CM

## 2022-06-08 MED ORDER — LANTUS SOLOSTAR 100 UNIT/ML ~~LOC~~ SOPN: 30.0000 [IU] | PEN_INJECTOR | Freq: Every day | SUBCUTANEOUS | 1 refills | Status: DC

## 2022-06-08 NOTE — Patient Instructions (Addendum)
Please come back in 4-6 months for the second flu vaccine  Type 2 Diabetes Mellitus, Diagnosis, Adult Type 2 diabetes (type 2 diabetes mellitus) is a long-term, or chronic, disease. In type 2 diabetes, one or both of these problems may be present: The pancreas does not make enough of a hormone called insulin. Cells in the body do not respond properly to the insulin that the body makes (insulin resistance). Normally, insulin allows blood sugar (glucose) to enter cells in the body. The cells use glucose for energy. Insulin resistance or lack of insulin causes excess glucose to build up in the blood instead of going into cells. This causes high blood glucose (hyperglycemia).  What are the causes? The exact cause of type 2 diabetes is not known. What increases the risk? The following factors may make you more likely to develop this condition: Having a family member with type 2 diabetes. Being overweight or obese. Being inactive (sedentary). Having been diagnosed with insulin resistance. Having a history of prediabetes, diabetes when you were pregnant (gestational diabetes), or polycystic ovary syndrome (PCOS). What are the signs or symptoms? In the early stage of this condition, you may not have symptoms. Symptoms develop slowly and may include: Increased thirst or hunger. Increased urination. Unexplained weight loss. Tiredness (fatigue) or weakness. Vision changes, such as blurry vision. Dark patches on the skin. How is this diagnosed? This condition is diagnosed based on your symptoms, your medical history, a physical exam, and your blood glucose level. Your blood glucose may be checked with one or more of the following blood tests: A fasting blood glucose (FBG) test. You will not be allowed to eat (you will fast) for 8 hours or longer before a blood sample is taken. A random blood glucose test. This test checks blood glucose at any time of day regardless of when you ate. An A1C (hemoglobin  A1C) blood test. This test provides information about blood glucose levels over the previous 2-3 months. An oral glucose tolerance test (OGTT). This test measures your blood glucose at two times: After fasting. This is your baseline blood glucose level. Two hours after drinking a beverage that contains glucose. You may be diagnosed with type 2 diabetes if: Your fasting blood glucose level is 126 mg/dL (7.0 mmol/L) or higher. Your random blood glucose level is 200 mg/dL (11.1 mmol/L) or higher. Your A1C level is 6.5% or higher. Your oral glucose tolerance test result is higher than 200 mg/dL (11.1 mmol/L). These blood tests may be repeated to confirm your diagnosis. How is this treated? Your treatment may be managed by a specialist called an endocrinologist. Type 2 diabetes may be treated by following instructions from your health care provider about: Making dietary and lifestyle changes. These may include: Following a personalized nutrition plan that is developed by a registered dietitian. Exercising regularly. Finding ways to manage stress. Checking your blood glucose level as often as told. Taking diabetes medicines or insulin daily. This helps to keep your blood glucose levels in the healthy range. Taking medicines to help prevent complications from diabetes. Medicines may include: Aspirin. Medicine to lower cholesterol. Medicine to control blood pressure. Your health care provider will set treatment goals for you. Your goals will be based on your age, other medical conditions you have, and how you respond to diabetes treatment. Generally, the goal of treatment is to maintain the following blood glucose levels: Before meals: 80-130 mg/dL (4.4-7.2 mmol/L). After meals: below 180 mg/dL (10 mmol/L). A1C level: less than 7%.  Follow these instructions at home: Questions to ask your health care provider Consider asking the following questions: Should I meet with a certified diabetes care  and education specialist? What diabetes medicines do I need, and when should I take them? What equipment will I need to manage my diabetes at home? How often do I need to check my blood glucose? Where can I find a support group for people with diabetes? What number can I call if I have questions? When is my next appointment? General instructions Take over-the-counter and prescription medicines only as told by your health care provider. Keep all follow-up visits. This is important. Where to find more information For help and guidance and for more information about diabetes, please visit: American Diabetes Association (ADA): www.diabetes.org American Association of Diabetes Care and Education Specialists (ADCES): www.diabeteseducator.org International Diabetes Federation (IDF): MemberVerification.ca Contact a health care provider if: Your blood glucose is at or above 240 mg/dL (13.3 mmol/L) for 2 days in a row. You have been sick or have had a fever for 2 days or longer, and you are not getting better. You have any of the following problems for more than 6 hours: You cannot eat or drink. You have nausea and vomiting. You have diarrhea. Get help right away if: You have severe hypoglycemia. This means your blood glucose is lower than 54 mg/dL (3.0 mmol/L). You become confused or you have trouble thinking clearly. You have difficulty breathing. You have moderate or large ketone levels in your urine. These symptoms may represent a serious problem that is an emergency. Do not wait to see if the symptoms will go away. Get medical help right away. Call your local emergency services (911 in the [place] Do not drive yourself to the hospital. Summary Type 2 diabetes mellitus is a long-term, or chronic, disease. In type 2 diabetes, the pancreas does not make enough of a hormone called insulin, or cells in the body do not respond properly to insulin that the body makes. This condition is treated by making  dietary and lifestyle changes and taking diabetes medicines or insulin. Your health care provider will set treatment goals for you. Your goals will be based on your age, other medical conditions you have, and how you respond to diabetes treatment. Keep all follow-up visits. This is important. This information is not intended to replace advice given to you by your health care provider. Make sure you discuss any questions you have with your health care provider. Document Revised: 09/07/2020 Document Reviewed: 09/07/2020 Elsevier Patient Education  [place].

## 2022-06-08 NOTE — Progress Notes (Signed)
Subjective:  Patient ID: Kimberly [name], female    DOB: 10/14/1955  Age: 66 y.o. MRN: [id number]  CC: Anemia, Hypertension, and Diabetes   HPI [name] presents for f/up -  She is active and denies chest pain, shortness of breath, diaphoresis, or edema.  She complains that her blood sugar has been down into the 60s and 70s so she has decreased her Lantus to this dose to 50 units a day but remains concerned about hypoglycemia.  Outpatient Medications Prior to Visit  Medication Sig Dispense Refill   albuterol (VENTOLIN HFA) 108 (90 Base) MCG/ACT inhaler Inhale 2 puffs into the lungs every 4 (four) hours as needed. ADDITIONAL REFILLS FROM PCP 1 each 0   amLODipine (NORVASC) 10 MG tablet TAKE 1 TABLET BY MOUTH DAILY 90 tablet 0   aspirin EC 81 MG tablet Take 81 mg by mouth daily. Swallow whole.     Blood Glucose Monitoring Suppl (CONTOUR NEXT MONITOR) w/Device KIT Use to test blood sugars 4 times daily. Dx: e11.9 1 kit 1   cetirizine (ZYRTEC) 10 MG tablet Take 1 tablet (10 mg total) by mouth daily. 30 tablet 0   chlorthalidone (HYGROTON) 25 MG tablet Take 1 tablet (25 mg total) by mouth daily. 90 tablet 3   fluticasone (FLONASE) 50 MCG/ACT nasal spray Place 1 spray into both nostrils daily. 16 g 0   glucose blood (CONTOUR NEXT TEST) test strip Use to test blood sugars 4 times daily. Dx:e11.9 400 each 12   Insulin Pen Needle (BD PEN NEEDLE NANO 2ND GEN) 32G X 4 MM MISC USE AS DIRECTED ONCE DAILY 100 each 1   Lancets 30G MISC Use to check blood sugars 4 times daily. Dx:e11.9. please let us know if not covered 400 each 12   losartan (COZAAR) 50 MG tablet TAKE ONE TABLET BY MOUTH DAILY 90 tablet 3   NON FORMULARY [facility] Peripheral Neuropathy Cream: Bupivacaine 1%; Doxepin 3%; Gabapentin 6%; Pentoxifylline 3%; Topiramate 1%  Faxed over to [facility] - 05/02/19 with 5 refills     rosuvastatin (CRESTOR) 10 MG tablet TAKE ONE TABLET BY MOUTH DAILY 90 tablet 0    Semaglutide (RYBELSUS) 7 MG TABS Take 7 mg by mouth daily. 90 tablet 0   zinc gluconate 50 MG tablet TAKE 1 TABLET BY MOUTH DAILY 90 tablet 1   KERENDIA 10 MG TABS Take 1 tablet by mouth daily at 12 noon.     LANTUS SOLOSTAR 100 UNIT/ML Solostar Pen INJECT 80 UNITS INTO THE SKIN DAILY 72 mL 1   No facility-administered medications prior to visit.    ROS Review of Systems  Constitutional: Negative.  Negative for appetite change, diaphoresis, fatigue and unexpected weight change.  HENT: Negative.    Eyes: Negative.   Respiratory:  Negative for cough, chest tightness, shortness of breath and wheezing.   Cardiovascular:  Negative for chest pain, palpitations and leg swelling.  Gastrointestinal:  Positive for abdominal pain. Negative for constipation, diarrhea, nausea and vomiting.  Endocrine: Negative.   Genitourinary: Negative.  Negative for difficulty urinating.  Musculoskeletal: Negative.   Skin: Negative.   Neurological: Negative.  Negative for dizziness, weakness, light-headedness and headaches.  Hematological:  Negative for adenopathy. Does not bruise/bleed easily.  Psychiatric/Behavioral: Negative.      Objective:  BP 130/74 (BP Location: Left Arm, Patient Position: Sitting, Cuff Size: Large)   Pulse 82   Temp 98 F (36.7 C) (Oral)   Ht _0  (1.803 m)   Wt  186 lb (84.4 kg)   SpO2 95%   BMI 25.94 kg/m   BP Readings from Last 3 Encounters:  06/08/22 130/74  03/15/22 (!) 130/58  02/09/22 (!) 148/72    Wt Readings from Last 3 Encounters:  06/08/22 186 lb (84.4 kg)  03/15/22 186 lb 8 oz (84.6 kg)  02/09/22 186 lb (84.4 kg)    Physical Exam Vitals reviewed.  HENT:     Nose: Nose normal.     Mouth/Throat:     Mouth: Mucous membranes are moist.  Eyes:     General: No scleral icterus.    Conjunctiva/sclera: Conjunctivae normal.  Cardiovascular:     Rate and Rhythm: Normal rate and regular rhythm.     Pulses: Normal pulses.     Heart sounds: No murmur  heard. Pulmonary:     Effort: Pulmonary effort is normal.     Breath sounds: No stridor. No wheezing, rhonchi or rales.  Abdominal:     General: Abdomen is flat.     Palpations: There is no mass.     Tenderness: There is no abdominal tenderness. There is no guarding.     Hernia: No hernia is present.  Musculoskeletal:        General: Normal range of motion.     Cervical back: Neck supple.     Right lower leg: No edema.     Left lower leg: No edema.  Lymphadenopathy:     Cervical: No cervical adenopathy.  Skin:    General: Skin is warm and dry.  Neurological:     General: No focal deficit present.     Mental Status: She is alert.  Psychiatric:        Mood and Affect: Mood normal.        Behavior: Behavior normal.     Lab Results  Component Value Date   WBC 6.5 06/01/2022   HGB 10.9 (A) 06/01/2022   HCT 33 (A) 06/01/2022   PLT 184 06/01/2022   GLUCOSE 91 02/09/2022   CHOL 179 02/09/2022   TRIG 202.0 (H) 02/09/2022   HDL 41.40 02/09/2022   LDLDIRECT 94.0 02/09/2022   LDLCALC 138 (H) 01/28/2021   ALT 15 07/22/2021   AST 20 07/22/2021   NA 135 (A) 06/01/2022   K 3.6 06/01/2022   CL 100 06/01/2022   CREATININE 1.8 (A) 06/01/2022   BUN 42 (A) 06/01/2022   CO2 25 (A) 06/01/2022   TSH 1.88 07/22/2021   HGBA1C 7.5 (H) 06/08/2022   MICROALBUR 12.3 (H) 02/09/2022    MR Pelvis W Wo Contrast  Result Date: 04/25/2022 CLINICAL DATA:  Follow-up right adnexal cystic lesion. Postmenopausal female. Chronic abdominal pain. EXAM: MRI PELVIS WITHOUT AND WITH CONTRAST TECHNIQUE: Multiplanar multisequence MR imaging of the pelvis was performed both before and after administration of intravenous contrast. CONTRAST:  9 mL Vueway COMPARISON:  08/29/2021 FINDINGS: Lower Urinary Tract: No urinary bladder or urethral abnormality identified. Bowel: Unremarkable pelvic bowel loops. Vascular/Lymphatic: Unremarkable. No pathologically enlarged pelvic lymph nodes identified. Reproductive: --  Uterus: Prior hysterectomy. Vaginal cuff is unremarkable in appearance. -- Right ovary: A tubular cystic lesion with a thin fold which does not completely traverse the lumen is seen in the right adnexa. This measures 7.4 x 3.0 cm and is stable. This is consistent with a hydrosalpinx. -- Left ovary: A left ovarian mass is seen which measures 3.0 x 2.6 cm, which remains stable since prior exam. This shows T2 hypointensity and diffuse contrast enhancement, consistent with a ovarian  fibrothecoma. Other: No peritoneal thickening or abnormal free fluid. Musculoskeletal:  Unremarkable. IMPRESSION: Moderate right hydrosalpinx, without significant change. 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 Surgical evaluation should be considered. Prior hysterectomy. Electronically Signed   By: Marlaine Hind M.D.   On: 04/25/2022 09:58    Assessment & Plan:   Carleah was seen today for anemia, hypertension and diabetes.  Diagnoses and all orders for this visit:  Essential hypertension- Her blood pressure is well-controlled. -     Cancel: Basic metabolic panel; Future  Anemia due to zinc deficiency -     Cancel: CBC with Differential/Platelet; Future  Type II diabetes mellitus with complication (HCC) -     Cancel: Basic metabolic panel; Future -     Hemoglobin A1c; Future -     insulin glargine (LANTUS SOLOSTAR) 100 UNIT/ML Solostar Pen; Inject 30 Units into the skin daily. -     Hemoglobin A1c -     Finerenone (KERENDIA) 20 MG TABS; Take 1 tablet by mouth daily.  Insulin-requiring or dependent type II diabetes mellitus ([place])- She is having mild hypoglycemia.  Will decrease the dose of the basal insulin. -     insulin glargine (LANTUS SOLOSTAR) 100 UNIT/ML Solostar Pen; Inject 30 Units into the skin daily. -     Glucagon (GVOKE HYPOPEN 2-PACK) 1 MG/0.2ML SOAJ; Inject 1 Act into the skin daily as needed.  Poorly controlled type II diabetes mellitus with renal  complication (HCC) -     insulin glargine (LANTUS SOLOSTAR) 100 UNIT/ML Solostar Pen; Inject 30 Units into the skin daily.  Anemia in stage 3b chronic kidney disease ([place])- Her H&H are stable.  CKD (chronic kidney disease), stage IV [facility])- She recently saw nephrology. -     Finerenone (KERENDIA) 20 MG TABS; Take 1 tablet by mouth daily.  Other orders -     Zoster Recombinant (Shingrix )   I have discontinued Susi A. Busbin's [place] Clamp. I have also changed her Lantus SoloStar. Additionally, I am having her start on Gvoke HypoPen 2-Pack and [place]. Lastly, I am having her maintain her NON FORMULARY, Contour Next Monitor, cetirizine, fluticasone, chlorthalidone, Contour Next Test, Lancets 30G, rosuvastatin, zinc gluconate, albuterol, aspirin EC, Rybelsus, BD Pen Needle Nano 2nd Gen, losartan, and amLODipine.  Meds ordered this encounter  Medications   insulin glargine (LANTUS SOLOSTAR) 100 UNIT/ML Solostar Pen    Sig: Inject 30 Units into the skin daily.    Dispense:  72 mL    Refill:  1   Glucagon (GVOKE HYPOPEN 2-PACK) 1 MG/0.2ML SOAJ    Sig: Inject 1 Act into the skin daily as needed.    Dispense:  2 mL    Refill:  5   Finerenone (KERENDIA) 20 MG TABS    Sig: Take 1 tablet by mouth daily.    Dispense:  90 tablet    Refill:  1     Follow-up: Return in about 6 months (around 12/08/2022).  Scarlette Calico, MD

## 2022-06-09 DIAGNOSIS — E119 Type 2 diabetes mellitus without complications: Secondary | ICD-10-CM | POA: Insufficient documentation

## 2022-06-09 DIAGNOSIS — D631 Anemia in chronic kidney disease: Secondary | ICD-10-CM | POA: Insufficient documentation

## 2022-06-09 LAB — HEMOGLOBIN A1C: Hgb A1c MFr Bld: 7.5 % — ABNORMAL HIGH (ref 4.6–6.5)

## 2022-06-12 MED ORDER — KERENDIA 20 MG PO TABS: 1.0000 | ORAL_TABLET | Freq: Every day | ORAL | 1 refills | Status: DC

## 2022-06-12 MED ORDER — GVOKE HYPOPEN 2-PACK 1 MG/0.2ML ~~LOC~~ SOAJ: 1.0000 | Freq: Every day | SUBCUTANEOUS | 5 refills | Status: DC | PRN

## 2022-06-13 ENCOUNTER — Encounter: Payer: Self-pay | Admitting: Gynecologic Oncology

## 2022-06-14 ENCOUNTER — Inpatient Hospital Stay: Payer: Medicare Other | Admitting: Gynecologic Oncology

## 2022-06-14 ENCOUNTER — Other Ambulatory Visit: Payer: Self-pay

## 2022-06-19 ENCOUNTER — Other Ambulatory Visit (HOSPITAL_BASED_OUTPATIENT_CLINIC_OR_DEPARTMENT_OTHER): Payer: Self-pay | Admitting: Cardiovascular Disease

## 2022-06-21 ENCOUNTER — Ambulatory Visit (HOSPITAL_BASED_OUTPATIENT_CLINIC_OR_DEPARTMENT_OTHER): Payer: Medicare Other | Admitting: Cardiovascular Disease

## 2022-06-22 ENCOUNTER — Telehealth (HOSPITAL_BASED_OUTPATIENT_CLINIC_OR_DEPARTMENT_OTHER): Payer: Self-pay

## 2022-06-22 NOTE — Telephone Encounter (Signed)
PRIOR AUTH REQUEST RECEIVED FROM [facility],   KEY: [id number],   Per patient's chart this medication was discontinued due to leg cramping. Verified this with patient. PA deleted and pharmacy notified.

## 2022-06-24 ENCOUNTER — Encounter: Payer: Self-pay | Admitting: Internal Medicine

## 2022-06-24 ENCOUNTER — Ambulatory Visit (INDEPENDENT_AMBULATORY_CARE_PROVIDER_SITE_OTHER): Payer: Medicare Other

## 2022-06-24 ENCOUNTER — Other Ambulatory Visit: Payer: Self-pay | Admitting: Internal Medicine

## 2022-06-24 ENCOUNTER — Ambulatory Visit (INDEPENDENT_AMBULATORY_CARE_PROVIDER_SITE_OTHER): Payer: Medicare Other | Admitting: Internal Medicine

## 2022-06-24 VITALS — BP 146/70 | HR 86 | Temp 98.1°F | Resp 16 | Ht 71.0 in | Wt 178.0 lb

## 2022-06-24 DIAGNOSIS — R052 Subacute cough: Secondary | ICD-10-CM | POA: Insufficient documentation

## 2022-06-24 DIAGNOSIS — E118 Type 2 diabetes mellitus with unspecified complications: Secondary | ICD-10-CM | POA: Diagnosis not present

## 2022-06-24 DIAGNOSIS — R10817 Generalized abdominal tenderness: Secondary | ICD-10-CM | POA: Insufficient documentation

## 2022-06-24 DIAGNOSIS — R109 Unspecified abdominal pain: Secondary | ICD-10-CM | POA: Diagnosis not present

## 2022-06-24 DIAGNOSIS — I10 Essential (primary) hypertension: Secondary | ICD-10-CM

## 2022-06-24 DIAGNOSIS — K529 Noninfective gastroenteritis and colitis, unspecified: Secondary | ICD-10-CM | POA: Insufficient documentation

## 2022-06-24 DIAGNOSIS — R197 Diarrhea, unspecified: Secondary | ICD-10-CM | POA: Diagnosis not present

## 2022-06-24 DIAGNOSIS — N184 Chronic kidney disease, stage 4 (severe): Secondary | ICD-10-CM

## 2022-06-24 DIAGNOSIS — R748 Abnormal levels of other serum enzymes: Secondary | ICD-10-CM | POA: Insufficient documentation

## 2022-06-24 LAB — LIPASE: Lipase: 150 U/L — ABNORMAL HIGH (ref 11.0–59.0)

## 2022-06-24 LAB — BASIC METABOLIC PANEL
BUN: 41 mg/dL — ABNORMAL HIGH (ref 6–23)
CO2: 25 mEq/L (ref 19–32)
Calcium: 9.5 mg/dL (ref 8.4–10.5)
Chloride: 100 mEq/L (ref 96–112)
Creatinine, Ser: 1.88 mg/dL — ABNORMAL HIGH (ref 0.40–1.20)
GFR: 27.1 mL/min — ABNORMAL LOW (ref >60.00)
Glucose, Bld: 140 mg/dL — ABNORMAL HIGH (ref 70–99)
Potassium: 3.3 mEq/L — ABNORMAL LOW (ref 3.5–5.1)
Sodium: 133 mEq/L — ABNORMAL LOW (ref 135–145)

## 2022-06-24 LAB — CBC WITH DIFFERENTIAL/PLATELET
Basophils Absolute: 0 10*3/uL (ref 0.0–0.1)
Basophils Relative: 0.3 % (ref 0.0–3.0)
Eosinophils Absolute: 0.4 10*3/uL (ref 0.0–0.7)
Eosinophils Relative: 4.3 % (ref 0.0–5.0)
HCT: 34.7 % — ABNORMAL LOW (ref 36.0–46.0)
Hemoglobin: 11.6 g/dL — ABNORMAL LOW (ref 12.0–15.0)
Lymphocytes Relative: 13 % (ref 12.0–46.0)
Lymphs Abs: 1.2 10*3/uL (ref 0.7–4.0)
MCHC: 33.6 g/dL (ref 30.0–36.0)
MCV: 86 fl (ref 78.0–100.0)
Monocytes Absolute: 0.8 10*3/uL (ref 0.1–1.0)
Monocytes Relative: 8.7 % (ref 3.0–12.0)
Neutro Abs: 6.7 10*3/uL (ref 1.4–7.7)
Neutrophils Relative %: 73.7 % (ref 43.0–77.0)
Platelets: 271 10*3/uL (ref 150.0–400.0)
RBC: 4.03 Mil/uL (ref 3.87–5.11)
RDW: 13.6 % (ref 11.5–15.5)
WBC: 9 10*3/uL (ref 4.0–10.5)

## 2022-06-24 LAB — AMYLASE: Amylase: 174 U/L — ABNORMAL HIGH (ref 27–131)

## 2022-06-24 MED ORDER — PROMETHAZINE HCL 12.5 MG PO TABS: 12.5000 mg | ORAL_TABLET | Freq: Four times a day (QID) | ORAL | 0 refills | Status: DC | PRN

## 2022-06-24 NOTE — Progress Notes (Addendum)
Subjective:  Patient ID: Kimberly [name], female    DOB: 11-19-1955  Age: 66 y.o. MRN: [id number]  CC: Abdominal Pain and Cough   HPI Kimberly [name] presents for f/up -   Her symptoms started 2 weeks ago with fever to 101.6 and chills.  The fever and chills have resolved She complains of nonproductive cough, diarrhea that is mucoid, nausea, loss of appetite, and abdominal pain.  She denies vomiting.  Outpatient Medications Prior to Visit  Medication Sig Dispense Refill   albuterol (VENTOLIN HFA) 108 (90 Base) MCG/ACT inhaler Inhale 2 puffs into the lungs every 4 (four) hours as needed. ADDITIONAL REFILLS FROM PCP 1 each 0   amLODipine (NORVASC) 10 MG tablet TAKE 1 TABLET BY MOUTH DAILY 90 tablet 0   aspirin EC 81 MG tablet Take 81 mg by mouth daily. Swallow whole.     Blood Glucose Monitoring Suppl (CONTOUR NEXT MONITOR) w/Device KIT Use to test blood sugars 4 times daily. Dx: e11.9 1 kit 1   cetirizine (ZYRTEC) 10 MG tablet Take 1 tablet (10 mg total) by mouth daily. 30 tablet 0   chlorthalidone (HYGROTON) 25 MG tablet TAKE ONE TABLET BY MOUTH DAILY 90 tablet 1   Finerenone (KERENDIA) 20 MG TABS Take 1 tablet by mouth daily. 90 tablet 1   fluticasone (FLONASE) 50 MCG/ACT nasal spray Place 1 spray into both nostrils daily. 16 g 0   Glucagon (GVOKE HYPOPEN 2-PACK) 1 MG/0.2ML SOAJ Inject 1 Act into the skin daily as needed. 2 mL 5   glucose blood (CONTOUR NEXT TEST) test strip Use to test blood sugars 4 times daily. Dx:e11.9 400 each 12   insulin glargine (LANTUS SOLOSTAR) 100 UNIT/ML Solostar Pen Inject 30 Units into the skin daily. 72 mL 1   Insulin Pen Needle (BD PEN NEEDLE NANO 2ND GEN) 32G X 4 MM MISC USE AS DIRECTED ONCE DAILY 100 each 1   Lancets 30G MISC Use to check blood sugars 4 times daily. Dx:e11.9. please let us know if not covered 400 each 12   losartan (COZAAR) 50 MG tablet TAKE ONE TABLET BY MOUTH DAILY 90 tablet 3   NON FORMULARY [facility] Peripheral  Neuropathy Cream: Bupivacaine 1%; Doxepin 3%; Gabapentin 6%; Pentoxifylline 3%; Topiramate 1%  Faxed over to [facility] - 05/02/19 with 5 refills     rosuvastatin (CRESTOR) 10 MG tablet TAKE ONE TABLET BY MOUTH DAILY 90 tablet 0   Semaglutide (RYBELSUS) 7 MG TABS Take 7 mg by mouth daily. 90 tablet 0   zinc gluconate 50 MG tablet TAKE 1 TABLET BY MOUTH DAILY 90 tablet 1   No facility-administered medications prior to visit.    ROS Review of Systems  Constitutional:  Positive for chills, fever and unexpected weight change. Negative for diaphoresis and fatigue.  HENT: Negative.  Negative for trouble swallowing.   Eyes: Negative.   Respiratory:  Positive for cough. Negative for chest tightness, shortness of breath and wheezing.   Cardiovascular:  Negative for chest pain, palpitations and leg swelling.  Gastrointestinal:  Positive for abdominal pain, diarrhea and nausea. Negative for constipation and vomiting.  Genitourinary: Negative.  Negative for difficulty urinating and dysuria.  Musculoskeletal: Negative.   Skin: Negative.   Neurological: Negative.   Hematological:  Negative for adenopathy. Does not bruise/bleed easily.  Psychiatric/Behavioral: Negative.      Objective:  BP (!) 146/70 (BP Location: Right Arm, Patient Position: Sitting, Cuff Size: Large)   Pulse 86   Temp 98.1 F (  36.7 C) (Oral)   Resp 16   Ht _0  (1.803 m)   Wt 178 lb (80.7 kg)   SpO2 93%   BMI 24.83 kg/m   BP Readings from Last 3 Encounters:  06/24/22 (!) 146/70  06/14/22 (!) 156/66  06/08/22 130/74    Wt Readings from Last 3 Encounters:  06/24/22 178 lb (80.7 kg)  06/14/22 177 lb (80.3 kg)  06/08/22 186 lb (84.4 kg)    Physical Exam Vitals reviewed.  Constitutional:      General: She is not in acute distress.    Appearance: She is ill-appearing. She is not toxic-appearing or diaphoretic.  HENT:     Mouth/Throat:     Mouth: Mucous membranes are moist.  Eyes:     General: No  scleral icterus.    Conjunctiva/sclera: Conjunctivae normal.  Cardiovascular:     Rate and Rhythm: Normal rate and regular rhythm.     Heart sounds: No murmur heard. Pulmonary:     Effort: Pulmonary effort is normal.     Breath sounds: No stridor. No wheezing, rhonchi or rales.  Abdominal:     General: Abdomen is flat. Bowel sounds are normal. There is no distension.     Palpations: Abdomen is soft. There is no hepatomegaly, splenomegaly or mass.     Tenderness: There is generalized abdominal tenderness. There is no guarding or rebound.     Hernia: No hernia is present.  Musculoskeletal:        General: Normal range of motion.     Cervical back: Neck supple.     Right lower leg: No edema.     Left lower leg: No edema.  Lymphadenopathy:     Cervical: No cervical adenopathy.  Skin:    General: Skin is warm and dry.  Neurological:     General: No focal deficit present.     Mental Status: She is alert.  Psychiatric:        Mood and Affect: Mood normal.        Behavior: Behavior normal.     Lab Results  Component Value Date   WBC 9.0 06/24/2022   HGB 11.6 (L) 06/24/2022   HCT 34.7 (L) 06/24/2022   PLT 271.0 06/24/2022   GLUCOSE 140 (H) 06/24/2022   CHOL 179 02/09/2022   TRIG 202.0 (H) 02/09/2022   HDL 41.40 02/09/2022   LDLDIRECT 94.0 02/09/2022   LDLCALC 138 (H) 01/28/2021   ALT 15 07/22/2021   AST 20 07/22/2021   NA 133 (L) 06/24/2022   K 3.3 (L) 06/24/2022   CL 100 06/24/2022   CREATININE 1.88 (H) 06/24/2022   BUN 41 (H) 06/24/2022   CO2 25 06/24/2022   TSH 1.88 07/22/2021   HGBA1C 7.5 (H) 06/08/2022   MICROALBUR 12.3 (H) 02/09/2022    MR Pelvis W Wo Contrast  Result Date: 04/25/2022 CLINICAL DATA:  Follow-up right adnexal cystic lesion. Postmenopausal female. Chronic abdominal pain. EXAM: MRI PELVIS WITHOUT AND WITH CONTRAST TECHNIQUE: Multiplanar multisequence MR imaging of the pelvis was performed both before and after administration of intravenous  contrast. CONTRAST:  9 mL [employer] COMPARISON:  08/29/2021 FINDINGS: Lower Urinary Tract: No urinary bladder or urethral abnormality identified. Bowel: Unremarkable pelvic bowel loops. Vascular/Lymphatic: Unremarkable. No pathologically enlarged pelvic lymph nodes identified. Reproductive: -- Uterus: Prior hysterectomy. Vaginal cuff is unremarkable in appearance. -- Right ovary: A tubular cystic lesion with a thin fold which does not completely traverse the lumen is seen in the right adnexa. This measures 7.4  x 3.0 cm and is stable. This is consistent with a hydrosalpinx. -- Left ovary: A left ovarian mass is seen which measures 3.0 x 2.6 cm, which remains stable since prior exam. This shows T2 hypointensity and diffuse contrast enhancement, consistent with a ovarian fibrothecoma. Other: No peritoneal thickening or abnormal free fluid. Musculoskeletal:  Unremarkable. IMPRESSION: Moderate right hydrosalpinx, without significant change. 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 Surgical evaluation should be considered. Prior hysterectomy. Electronically Signed   By: Marlaine Hind M.D.   On: 04/25/2022 09:58    DG ABD ACUTE 2+V W 1V CHEST  Result Date: 06/24/2022 CLINICAL DATA:  Abdominal pain and diarrhea for 2 weeks. EXAM: DG ABDOMEN ACUTE WITH 1 VIEW CHEST COMPARISON:  Chest radiograph 01/23/2019, MR abdomen 03/01/2022 FINDINGS: Chest: The cardiomediastinal silhouette is normal. There is no focal consolidation or pulmonary edema. There is no pleural effusion or pneumothorax There is no acute osseous abnormality. Abdomen: There is a nonobstructive bowel gas pattern. There is no free intraperitoneal air. There is no gross organomegaly or abnormal soft tissue calcification. There is no acute osseous abnormality. IMPRESSION: Unremarkable chest and abdominal radiographs with no acute finding. Electronically Signed   By: Valetta Mole M.D.   On: 06/24/2022 11:11      Assessment & Plan:   Kimberly [name] was seen today for abdominal pain and cough.  Diagnoses and all orders for this visit:  Subacute cough- Chest x-ray is negative for mass or infiltrate. -     DG ABD ACUTE 2+V W 1V CHEST; Future  Generalized abdominal tenderness without rebound tenderness- She has a myriad of GI symptoms and her amylase and lipase are elevated.  I am concerned she has pancreatitis so I have asked her to stop taking the GLP-1 agonist.  I also recommended an urgent CT scan. -     Lipase; Future -     Amylase; Future -     CBC with Differential/Platelet; Future -     DG ABD ACUTE 2+V W 1V CHEST; Future -     GI Profile, Stool, PCR; Future -     CBC with Differential/Platelet -     Amylase -     Lipase -     GI Profile, Stool, PCR -     CT Abdomen Pelvis Wo Contrast; Future  Diarrhea of presumed infectious origin -     CBC with Differential/Platelet; Future -     GI Profile, Stool, PCR; Future -     CBC with Differential/Platelet -     GI Profile, Stool, PCR  Essential hypertension- Her blood pressure is well-controlled. -     CBC with Differential/Platelet; Future -     Basic metabolic panel; Future -     Basic metabolic panel -     CBC with Differential/Platelet  Type II diabetes mellitus with complication ([place])- Her blood sugar is adequately well-controlled. -     Basic metabolic panel; Future -     Basic metabolic panel  Gastroenteritis -     promethazine (PHENERGAN) 12.5 MG tablet; Take 1 tablet (12.5 mg total) by mouth every 6 (six) hours as needed for nausea or vomiting.  CKD (chronic kidney disease), stage IV (HCC)  Elevated lipase -     CT Abdomen Pelvis Wo Contrast; Future   I am having Kimberly [name] start on promethazine. I am also having her maintain her NON FORMULARY, Contour Next Monitor, cetirizine, fluticasone, Contour Next Test, Lancets 30G,  rosuvastatin, zinc gluconate, albuterol, aspirin EC, Rybelsus, BD Pen Needle Nano 2nd Gen, losartan,  amLODipine, Lantus SoloStar, Gvoke HypoPen 2-Pack, Kerendia, and chlorthalidone.  Meds ordered this encounter  Medications   promethazine (PHENERGAN) 12.5 MG tablet    Sig: Take 1 tablet (12.5 mg total) by mouth every 6 (six) hours as needed for nausea or vomiting.    Dispense:  15 tablet    Refill:  0     Follow-up: Return in about 3 weeks (around 07/15/2022).  Scarlette Calico, MD

## 2022-06-24 NOTE — Patient Instructions (Signed)

## 2022-06-27 LAB — GI PROFILE, STOOL, PCR

## 2022-06-28 ENCOUNTER — Telehealth: Payer: Self-pay | Admitting: *Deleted

## 2022-06-28 ENCOUNTER — Encounter: Payer: Self-pay | Admitting: *Deleted

## 2022-06-28 NOTE — Patient Outreach (Signed)
  Care Coordination   Initial Visit Note   06/28/2022 Name: Kimberly [name] MRN: [id number] DOB: [birth year]  Kimberly [name] is a 67 y.o. year old female who sees Kimberly Lima, MD for primary care. I spoke with  Kimberly [name] by phone today.  What matters to the patients health and wellness today?  No needs    Goals Addressed             This Visit's Progress    COMPLETED: care coordination activity       Care Coordination Interventions: Reviewed medications with patient and discussed adherence with no needed refills Assessed social determinant of health barriers Educated on care management services. Pt indicated no needs to address at this time and declined to participate.          SDOH assessments and interventions completed:  Yes  SDOH Interventions Today    Flowsheet Row Most Recent Value  SDOH Interventions   Food Insecurity Interventions Intervention Not Indicated  Transportation Interventions Intervention Not Indicated        Care Coordination Interventions:  Yes, provided   Follow up plan: No further intervention required.   Encounter Outcome:  Pt. Visit Completed   Kimberly Mina, RN Care Management Coordinator [place] Office [phone]

## 2022-06-28 NOTE — Patient Instructions (Signed)
Visit Information  Thank you for taking time to visit with me today. Please don't hesitate to contact me if I can be of assistance to you.   Following are the goals we discussed today:   Goals Addressed             This Visit's Progress    COMPLETED: care coordination activity       Care Coordination Interventions: Reviewed medications with patient and discussed adherence with no needed refills Assessed social determinant of health barriers Educated on care management services. Pt indicated no needs to address at this time and declined to participate.           Please call the care guide team at [phone] if you need to cancel or reschedule your appointment.   If you are experiencing a Mental Health or [place] or need someone to talk to, please call the Suicide and Crisis Lifeline: 988  The patient verbalized understanding of instructions, educational materials, and care plan provided today and DECLINED offer to receive copy of patient instructions, educational materials, and care plan.   No further follow up required: No follow up needs  Raina Mina, RN Care Management Coordinator [place] Office [phone]

## 2022-06-29 ENCOUNTER — Telehealth: Payer: Self-pay

## 2022-06-29 ENCOUNTER — Ambulatory Visit (HOSPITAL_COMMUNITY): Payer: Medicare Other

## 2022-06-29 NOTE — Telephone Encounter (Signed)
Key: [id number]

## 2022-06-29 NOTE — Telephone Encounter (Signed)
Per CoverMyMeds: ? ?PA was denied.  ?

## 2022-07-07 DIAGNOSIS — N184 Chronic kidney disease, stage 4 (severe): Secondary | ICD-10-CM | POA: Diagnosis not present

## 2022-07-12 ENCOUNTER — Telehealth: Payer: Self-pay | Admitting: Internal Medicine

## 2022-07-12 NOTE — Telephone Encounter (Signed)
[employer] called to request we change the dates of the pt's CT authorization. Pt was able to get an appointment for the scan on 01.31.24 and the PA expires of the 27th.   If possible pleas change dates on PA to allow for the pt's scheduled CT scan appointment.   For any further information please call [employer]:  [phone]

## 2022-07-13 ENCOUNTER — Other Ambulatory Visit: Payer: Self-pay | Admitting: Internal Medicine

## 2022-07-13 ENCOUNTER — Encounter: Payer: Self-pay | Admitting: Gynecologic Oncology

## 2022-07-14 NOTE — Progress Notes (Signed)
Gynecologic Oncology Return Clinic Visit  07/15/22  Reason for Visit: follow-up  Treatment History: Patient reports left-sided intermittent flank pain for the last 2 years, is often associated with pain radiating across the mid abdomen towards her right side.  She had a visit to the emergency department in June 2022 for this pain.  CT imaging at that time on showed dependent gallstones and nonobstrucing left renal stone. Right simple appearing 5.3cm adnexal mass. 6-12 month imaging follow-up recommended.     She followed up with [facility] and had several ultrasounds in the fall:   Pelvic ultrasound on 01/04/2021: Left ovary measures 4.5 x 2 x 3 cm.  There are 2 cyst, the first measuring up to 2.08 cm and the second 2.35 cm.  Left ovary noted to have a 2.1 cm echogenic nodule with hypervascularity.  There is also a 2.4 cm avascular heterogenous hypoechoic nodule.  Right ovary is measured at 8.4 x 3.5 x 4.2 cm.  There is a 6.7 x 2.7 x 2.9 cm septated fluid-filled cyst.   Pelvic ultrasound exam on 04/12/2021: Left ovary measures 3.4 x 2.2 x 2.5 cm.  Right ovary measures 2.5 x 1.7 x 1.1 cm.  Left ovary noted to have a 1.8 x 1.8 cm cyst.  Right ovary described in the report as having a 7.6 cm elongated fluid-filled cyst with septation still visualized, thought to maybe represent a hydrosalpinx.  Left ovary visualization limited due to bowel.  Hypoechoic cyst still present but stable.   Most recent ultrasound imaging was with Dr. Corinna Capra at [facility] on 08/10/21.  This showed a right adnexa measures 2.8 x 1 x 1.7 cm.  Left ovary measures 4.5 x 3.4 x 2.4 cm.  In the right adnexa, there is a 7.5 x 3.3 x 5 cm elongated simple cyst without blood flow that appears to be adjacent to the ovary, possible paraovarian cyst versus hydrosalpinx.  The left ovary contains a 2.7 x 2.6 questionable solid mass versus complex cystic mass with internal echoes.  No blood flow seen to this mass.  There is also  another 11 x 6 mm area that has the same appearance.  No free fluid noted.   Tumor markers on 2/14: CA-125 was 9.7, CEA was 4.5  MRI of the pelvis on 08/29/2021 revealed thinly septated fluid signal cyst of the right ovary measuring 5.3 and 2.8.  No associated solid component or contrast-enhancement.  Given establish stability, thought to be benign.  Sigmoid diverticula noted.  Interval History: Recently lipase elevated 150, amylase 174 Last A1c in 05/2022 was 7.5%  Today, patient reports that she continues to have left-sided pain by her ribs, notes this is almost daily.  Has occasional right and left side pain that travels from her legs up to her ribs.  Constipation has improved significantly, she is eating lots of broccoli and other fiber.  She denies any urinary symptoms.  She denies any pelvic pain.  She is tolerating p.o. without nausea or emesis.  Past Medical/Surgical History: Past Medical History:  Diagnosis Date   Arthritis    Carotid bruit 03/21/2022   Cataract    OD   Chronic kidney disease    Diabetes mellitus 2001   Hyperlipidemia Dx 2012   Lower extremity edema 01/06/2021    Past Surgical History:  Procedure Laterality Date   CATARACT EXTRACTION Left    EYE SURGERY Left    Cat Sx   VAGINAL HYSTERECTOMY     AUB (surgery in  [place]), prior to 2000    Family History  Problem Relation Age of Onset   Depression Mother    Hypertension Mother    Diabetes Mother    Heart disease Father    Heart attack Father 4   Uterine cancer Sister    Breast cancer Sister    Lung cancer Brother    Prostate cancer Neg Hx    Endometrial cancer Neg Hx    Ovarian cancer Neg Hx    Colon cancer Neg Hx    Pancreatic cancer Neg Hx     Social History   Socioeconomic History   Marital status: Divorced    Spouse name: Not on file   Number of children: Not on file   Years of education: Not on file   Highest education level: Not on file  Occupational History   Occupation: retired   Tobacco Use   Smoking status: Never   Smokeless tobacco: Never  Vaping Use   Vaping Use: Never used  Substance and Sexual Activity   Alcohol use: Yes    Alcohol/week: 4.0 standard drinks of alcohol    Types: 4 Glasses of wine per week    Comment: occ   Drug use: No   Sexual activity: Not Currently    Partners: Female  Other Topics Concern   Not on file  Social History Narrative   Not on file   Social Determinants of Health   Financial Resource Strain: Low Risk  (11/09/2021)   Overall Financial Resource Strain (CARDIA)    Difficulty of Paying Living Expenses: Not hard at all  Food Insecurity: No Food Insecurity (06/28/2022)   Hunger Vital Sign    Worried About Running Out of Food in the Last Year: Never true    Ran Out of Food in the Last Year: Never true  Transportation Needs: No Transportation Needs (06/28/2022)   PRAPARE - Hydrologist (Medical): No    Lack of Transportation (Non-Medical): No  Physical Activity: Sufficiently Active (11/09/2021)   Exercise Vital Sign    Days of Exercise per Week: 5 days    Minutes of Exercise per Session: 30 min  Stress: No Stress Concern Present (11/09/2021)   [place]    Feeling of Stress : Not at all  Social Connections: Moderately Integrated (11/09/2021)   Social Connection and Isolation Panel [NHANES]    Frequency of Communication with Friends and Family: More than three times a week    Frequency of Social Gatherings with Friends and Family: More than three times a week    Attends Religious Services: More than 4 times per year    Active Member of [employer] or Organizations: Yes    Attends Music therapist: More than 4 times per year    Marital Status: Divorced    Current Medications:  Current Outpatient Medications:    albuterol (VENTOLIN HFA) 108 (90 Base) MCG/ACT inhaler, Inhale 2 puffs into the lungs every 4 (four) hours as  needed. ADDITIONAL REFILLS FROM PCP, Disp: 1 each, Rfl: 0   amLODipine (NORVASC) 10 MG tablet, TAKE 1 TABLET BY MOUTH DAILY, Disp: 90 tablet, Rfl: 0   aspirin EC 81 MG tablet, Take 81 mg by mouth daily. Swallow whole., Disp: , Rfl:    Blood Glucose Monitoring Suppl (CONTOUR NEXT MONITOR) w/Device KIT, Use to test blood sugars 4 times daily. Dx: e11.9, Disp: 1 kit, Rfl: 1   cetirizine (ZYRTEC) 10 MG  tablet, Take 1 tablet (10 mg total) by mouth daily., Disp: 30 tablet, Rfl: 0   chlorthalidone (HYGROTON) 25 MG tablet, TAKE ONE TABLET BY MOUTH DAILY, Disp: 90 tablet, Rfl: 1   Finerenone (KERENDIA) 20 MG TABS, Take 1 tablet by mouth daily., Disp: 90 tablet, Rfl: 1   fluticasone (FLONASE) 50 MCG/ACT nasal spray, Place 1 spray into both nostrils daily., Disp: 16 g, Rfl: 0   glucose blood (CONTOUR NEXT TEST) test strip, Use to test blood sugars 4 times daily. Dx:e11.9, Disp: 400 each, Rfl: 12   insulin glargine (LANTUS SOLOSTAR) 100 UNIT/ML Solostar Pen, Inject 30 Units into the skin daily., Disp: 72 mL, Rfl: 1   Insulin Pen Needle (BD PEN NEEDLE NANO 2ND GEN) 32G X 4 MM MISC, USE AS DIRECTED ONCE DAILY, Disp: 100 each, Rfl: 1   Lancets 30G MISC, Use to check blood sugars 4 times daily. Dx:e11.9. please let us know if not covered, Disp: 400 each, Rfl: 12   losartan (COZAAR) 50 MG tablet, TAKE ONE TABLET BY MOUTH DAILY, Disp: 90 tablet, Rfl: 3   rosuvastatin (CRESTOR) 10 MG tablet, TAKE ONE TABLET BY MOUTH DAILY, Disp: 90 tablet, Rfl: 0   zinc gluconate 50 MG tablet, TAKE 1 TABLET BY MOUTH DAILY, Disp: 90 tablet, Rfl: 1  Review of Systems: + cough Denies appetite changes, fevers, chills, fatigue, unexplained weight changes. Denies hearing loss, neck lumps or masses, mouth sores, ringing in ears or voice changes. Denies wheezing.  Denies shortness of breath. Denies chest pain or palpitations. Denies leg swelling. Denies abdominal distention, pain, blood in stools, constipation, diarrhea, nausea,  vomiting, or early satiety. Denies pain with intercourse, dysuria, frequency, hematuria or incontinence. Denies hot flashes, pelvic pain, vaginal bleeding or vaginal discharge.   Denies joint pain, back pain or muscle pain/cramps. Denies itching, rash, or wounds. Denies dizziness, headaches, numbness or seizures. Denies swollen lymph nodes or glands, denies easy bruising or bleeding. Denies anxiety, depression, confusion, or decreased concentration.  Physical Exam: BP (!) 143/61 (BP Location: Left Arm, Patient Position: Sitting)   Pulse 78   Temp 97.8 F (36.6 C) (Oral)   Resp 14   Wt 183 lb 9.6 oz (83.3 kg)   SpO2 100%   BMI 25.61 kg/m  General: Alert, oriented, no acute distress. HEENT: Normocephalic, atraumatic, sclera anicteric. Chest: Unlabored breathing on room air. Abdomen: soft, nontender except with palpation over left inferior rib cage.  Normoactive bowel sounds.  No masses or hepatosplenomegaly appreciated.   Extremities: Grossly normal range of motion.  Warm, well perfused.  No edema bilaterally. GU: Normal appearing external genitalia without erythema, excoriation, or lesions.  Bimanual exam reveals  some fullness within the mid pelvis and to the right, smooth, no masses or fullness appreciated on the left.  No nodularity.   Laboratory & Radiologic Studies: MRI pelvis 04/21/22: Moderate right hydrosalpinx, without significant change. 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 Surgical evaluation should be considered.   Prior hysterectomy.  MRI abdomen 03/01/22: 1.  No acute abdominal process. 2. Cholelithiasis 3.  Aortic Atherosclerosis (ICD10-I70.0).  Assessment & Plan: Kimberly [name] is a 67 y.o. woman with bilateral pelvic masses, stable on imaging.  Right appears to be a hydrosalpinx, left a small solid ovarian tumor.  Patient seen for follow-up today.  She continues to have left upper side pain, no pelvic pain.   Reviewed recent MRI which shows stable appearance of cystic mass in the right adnexa, suspected to  be a hydrosalpinx, and small solid-appearing left adnexal mass.  Given no change in size or character to this mass, I agree that it appears most consistent with a benign solid tumor.  The patient and I discussed again that given her symptomatology, as well as the fact that I am able to reproduce her pain on palpation along her upper rib cage, I think it is very unlikely that her pelvic findings are causing her abdominal symptoms.  We reviewed recent blood work showing elevated amylase and lipase concerning for pancreatitis.  The patient is scheduled for CT scan at the end of the month.  I have asked her to call me after she has gotten those results and spoken with her PCP.  While I continued to offer surgery, I stressed that I think it is unlikely that surgery to remove her tubes and ovaries is going to ameliorate her abdominal symptoms.  While my concern for malignancy is very low given stability of both adnexal lesions, we discussed that the only way to definitively rule out a cancer or borderline tumor would be with excision.  28 minutes of total time was spent for this patient encounter, including preparation, face-to-face counseling with the patient and coordination of care, and documentation of the encounter.  Jeral Pinch, MD  Division of Gynecologic Oncology  Department of Obstetrics and Gynecology  [facility]

## 2022-07-15 ENCOUNTER — Encounter: Payer: Self-pay | Admitting: Gynecologic Oncology

## 2022-07-15 ENCOUNTER — Inpatient Hospital Stay: Payer: Medicare Other | Attending: Gynecologic Oncology | Admitting: Gynecologic Oncology

## 2022-07-15 VITALS — BP 143/61 | HR 78 | Temp 97.8°F | Resp 14 | Wt 183.6 lb

## 2022-07-15 DIAGNOSIS — N9489 Other specified conditions associated with female genital organs and menstrual cycle: Secondary | ICD-10-CM

## 2022-07-15 DIAGNOSIS — N184 Chronic kidney disease, stage 4 (severe): Secondary | ICD-10-CM | POA: Diagnosis not present

## 2022-07-15 DIAGNOSIS — R748 Abnormal levels of other serum enzymes: Secondary | ICD-10-CM

## 2022-07-15 DIAGNOSIS — R109 Unspecified abdominal pain: Secondary | ICD-10-CM | POA: Insufficient documentation

## 2022-07-15 DIAGNOSIS — D398 Neoplasm of uncertain behavior of other specified female genital organs: Secondary | ICD-10-CM | POA: Diagnosis not present

## 2022-07-15 DIAGNOSIS — R1010 Upper abdominal pain, unspecified: Secondary | ICD-10-CM

## 2022-07-15 DIAGNOSIS — R948 Abnormal results of function studies of other organs and systems: Secondary | ICD-10-CM | POA: Diagnosis not present

## 2022-07-15 NOTE — Patient Instructions (Signed)
It was good to see you today.  Please call me after you have your CT scan and discuss with your primary care provider.  Again, things are reassuring on your last MRI.  The area in the right pelvis continues to look like a fluid-filled fallopian tube and the small mass on the left ovary has not changed significantly in terms of the way it looks or its size.  I still think it is unlikely that either of these findings is causing the left upper side pain you are having.

## 2022-07-18 ENCOUNTER — Other Ambulatory Visit: Payer: Medicare Other

## 2022-07-27 ENCOUNTER — Other Ambulatory Visit: Payer: Medicare Other

## 2022-07-27 ENCOUNTER — Encounter: Payer: Self-pay | Admitting: Internal Medicine

## 2022-07-27 ENCOUNTER — Ambulatory Visit
Admission: RE | Admit: 2022-07-27 | Discharge: 2022-07-27 | Disposition: A | Discharge status: Home / Self Care | Payer: Medicare Other | Source: Ambulatory Visit | Attending: Internal Medicine | Admitting: Internal Medicine

## 2022-07-27 DIAGNOSIS — K8020 Calculus of gallbladder without cholecystitis without obstruction: Secondary | ICD-10-CM | POA: Diagnosis not present

## 2022-07-27 DIAGNOSIS — R748 Abnormal levels of other serum enzymes: Secondary | ICD-10-CM

## 2022-07-27 DIAGNOSIS — R10817 Generalized abdominal tenderness: Secondary | ICD-10-CM

## 2022-07-27 DIAGNOSIS — R109 Unspecified abdominal pain: Secondary | ICD-10-CM | POA: Diagnosis not present

## 2022-07-27 DIAGNOSIS — R197 Diarrhea, unspecified: Secondary | ICD-10-CM

## 2022-07-27 DIAGNOSIS — N200 Calculus of kidney: Secondary | ICD-10-CM | POA: Diagnosis not present

## 2022-07-29 ENCOUNTER — Telehealth: Payer: Self-pay | Admitting: Gynecologic Oncology

## 2022-07-29 ENCOUNTER — Telehealth: Payer: Self-pay | Admitting: *Deleted

## 2022-07-29 NOTE — Telephone Encounter (Signed)
Spoke with patient. Please see my phone note. I asked her to keep [place] updated after she sees PCP and hopefully gets referral to general surgery

## 2022-07-29 NOTE — Telephone Encounter (Signed)
Patient calling to let Dr Berline Lopes know CT results are available.  PCP sent her my Chart message regarding gallstones- referral to general surgery if RUQ pain. And that mass near right ovary enlarging and references follow-up with Dr Berline Lopes.

## 2022-07-29 NOTE — Telephone Encounter (Signed)
Returned patient's call.  She had called earlier today to let me know that her CT scan was back.  We discussed CT findings.  There has been minimal change in the size of her right cystic adnexal lesion.  It is difficult to compare this to her MRI, but based on her CT scan from a year and a half ago, there has been less than a centimeter increase in size.  I continue to feel strongly that this is not the cause of her upper abdominal symptoms.  I have offered her surgery on multiple occasions although I think it is unlikely to help with her abdominal pain.  She has gallstones, and I have encouraged her to talk to her primary care provider about referral to general surgery.  It would make sense, if she is going to have a cholecystectomy, to take out her tubes and ovaries at the same time.  Jeral Pinch MD Gynecologic Oncology

## 2022-08-01 ENCOUNTER — Other Ambulatory Visit: Payer: Self-pay | Admitting: Internal Medicine

## 2022-08-01 DIAGNOSIS — E118 Type 2 diabetes mellitus with unspecified complications: Secondary | ICD-10-CM

## 2022-08-02 ENCOUNTER — Encounter: Payer: Self-pay | Admitting: Internal Medicine

## 2022-08-02 ENCOUNTER — Ambulatory Visit (INDEPENDENT_AMBULATORY_CARE_PROVIDER_SITE_OTHER): Payer: Medicare Other | Admitting: Internal Medicine

## 2022-08-02 VITALS — BP 128/68 | HR 76 | Temp 98.2°F | Ht 71.0 in | Wt 182.0 lb

## 2022-08-02 DIAGNOSIS — K8020 Calculus of gallbladder without cholecystitis without obstruction: Secondary | ICD-10-CM | POA: Diagnosis not present

## 2022-08-02 DIAGNOSIS — D538 Other specified nutritional anemias: Secondary | ICD-10-CM

## 2022-08-02 DIAGNOSIS — I10 Essential (primary) hypertension: Secondary | ICD-10-CM

## 2022-08-02 DIAGNOSIS — N184 Chronic kidney disease, stage 4 (severe): Secondary | ICD-10-CM

## 2022-08-02 NOTE — Progress Notes (Unsigned)
Subjective:  Patient ID: Charlann Noss, female    DOB: 10-07-1955  Age: 67 y.o. MRN: [id number]  CC: No chief complaint on file.   HPI Winfred Redel presents for ***  Outpatient Medications Prior to Visit  Medication Sig Dispense Refill   albuterol (VENTOLIN HFA) 108 (90 Base) MCG/ACT inhaler Inhale 2 puffs into the lungs every 4 (four) hours as needed. ADDITIONAL REFILLS FROM PCP 1 each 0   amLODipine (NORVASC) 10 MG tablet TAKE 1 TABLET BY MOUTH DAILY 90 tablet 0   aspirin EC 81 MG tablet Take 81 mg by mouth daily. Swallow whole.     Blood Glucose Monitoring Suppl (CONTOUR NEXT MONITOR) w/Device KIT Use to test blood sugars 4 times daily. Dx: e11.9 1 kit 1   cetirizine (ZYRTEC) 10 MG tablet Take 1 tablet (10 mg total) by mouth daily. 30 tablet 0   chlorthalidone (HYGROTON) 25 MG tablet TAKE ONE TABLET BY MOUTH DAILY 90 tablet 1   Finerenone (KERENDIA) 20 MG TABS Take 1 tablet by mouth daily. 90 tablet 1   fluticasone (FLONASE) 50 MCG/ACT nasal spray Place 1 spray into both nostrils daily. 16 g 0   glucose blood (CONTOUR NEXT TEST) test strip Use to test blood sugars 4 times daily. Dx:e11.9 400 each 12   insulin glargine (LANTUS SOLOSTAR) 100 UNIT/ML Solostar Pen Inject 30 Units into the skin daily. 72 mL 1   Insulin Pen Needle (BD PEN NEEDLE NANO 2ND GEN) 32G X 4 MM MISC USE AS DIRECTED ONCE DAILY 100 each 1   Lancets 30G MISC Use to check blood sugars 4 times daily. Dx:e11.9. please let us know if not covered 400 each 12   losartan (COZAAR) 50 MG tablet TAKE ONE TABLET BY MOUTH DAILY 90 tablet 3   rosuvastatin (CRESTOR) 10 MG tablet TAKE ONE TABLET BY MOUTH DAILY 90 tablet 0   zinc gluconate 50 MG tablet TAKE 1 TABLET BY MOUTH DAILY 90 tablet 1   No facility-administered medications prior to visit.    ROS Review of Systems  Objective:  BP 128/68 (BP Location: Left Arm, Patient Position: Sitting, Cuff Size: Normal)   Pulse 76   Temp 98.2 F (36.8 C) (Oral)   Ht '5\' 11"'$   (1.803 m)   Wt 182 lb (82.6 kg)   SpO2 95%   BMI 25.38 kg/m   BP Readings from Last 3 Encounters:  08/02/22 128/68  07/15/22 (!) 143/61  06/24/22 (!) 146/70    Wt Readings from Last 3 Encounters:  08/02/22 182 lb (82.6 kg)  07/15/22 183 lb 9.6 oz (83.3 kg)  06/24/22 178 lb (80.7 kg)    Physical Exam  Lab Results  Component Value Date   WBC 9.0 06/24/2022   HGB 11.6 (L) 06/24/2022   HCT 34.7 (L) 06/24/2022   PLT 271.0 06/24/2022   GLUCOSE 140 (H) 06/24/2022   CHOL 179 02/09/2022   TRIG 202.0 (H) 02/09/2022   HDL 41.40 02/09/2022   LDLDIRECT 94.0 02/09/2022   LDLCALC 138 (H) 01/28/2021   ALT 15 07/22/2021   AST 20 07/22/2021   NA 133 (L) 06/24/2022   K 3.3 (L) 06/24/2022   CL 100 06/24/2022   CREATININE 1.88 (H) 06/24/2022   BUN 41 (H) 06/24/2022   CO2 25 06/24/2022   TSH 1.88 07/22/2021   HGBA1C 7.5 (H) 06/08/2022   MICROALBUR 12.3 (H) 02/09/2022    CT Abdomen Pelvis Wo Contrast  Result Date: 07/27/2022 CLINICAL DATA:  Acute left-sided abdominal pain.  EXAM: CT ABDOMEN AND PELVIS WITHOUT CONTRAST TECHNIQUE: Multidetector CT imaging of the abdomen and pelvis was performed following the standard protocol without IV contrast. RADIATION DOSE REDUCTION: This exam was performed according to the departmental dose-optimization program which includes automated exposure control, adjustment of the mA and/or kV according to patient size and/or use of iterative reconstruction technique. COMPARISON:  December 16, 2020. FINDINGS: Lower chest: No acute abnormality. Hepatobiliary: Cholelithiasis. No biliary dilatation is noted. The liver is unremarkable on these unenhanced images. Pancreas: Unremarkable. No pancreatic ductal dilatation or surrounding inflammatory changes. Spleen: Normal in size without focal abnormality. Adrenals/Urinary Tract: Adrenal glands are unremarkable. Stable small nonobstructive left renal calculus. No hydronephrosis or renal obstruction is noted. Bladder is  unremarkable. Stomach/Bowel: Stomach is within normal limits. Appendix appears normal. No evidence of bowel wall thickening, distention, or inflammatory changes. Vascular/Lymphatic: Aortic atherosclerosis. No enlarged abdominal or pelvic lymph nodes. Reproductive: Status post hysterectomy. No left adnexal abnormality is noted. 6 cm right adnexal cyst is noted which is slightly increased compared to prior exam. Other: No abdominal wall hernia or abnormality. No abdominopelvic ascites. Musculoskeletal: No acute or significant osseous findings. IMPRESSION: Cholelithiasis. Stable nonobstructive left renal calculus. No hydronephrosis or renal obstruction is noted. 6 cm right adnexal cyst is noted which is slightly enlarged compared to prior exam. Recommend follow-up US in 6-12 months. Note: This recommendation does not apply to premenarchal patients and to those with increased risk (genetic, family history, elevated tumor markers or other high-risk factors) of ovarian cancer. Reference: JACR 2020 Feb; 17(2):248-254. Aortic Atherosclerosis (ICD10-I70.0). Electronically Signed   By: Marijo Conception M.D.   On: 07/27/2022 14:56    Assessment & Plan:   Diagnoses and all orders for this visit:  Essential hypertension -     Basic metabolic panel; Future -     CBC with Differential/Platelet; Future -     Magnesium; Future  CKD (chronic kidney disease), stage IV (HCC) -     Basic metabolic panel; Future -     CBC with Differential/Platelet; Future -     Magnesium; Future  Anemia due to zinc deficiency -     CBC with Differential/Platelet; Future  Multiple gallstones -     Ambulatory referral to General Surgery   I am having Vanette A. Duffey maintain her Contour Next Monitor, cetirizine, fluticasone, Contour Next Test, Lancets 30G, rosuvastatin, zinc gluconate, albuterol, aspirin EC, BD Pen Needle Nano 2nd Gen, losartan, amLODipine, Lantus SoloStar, Kerendia, and chlorthalidone.  No orders of the defined  types were placed in this encounter.    Follow-up: No follow-ups on file.  Scarlette Calico, MD

## 2022-08-02 NOTE — Patient Instructions (Signed)

## 2022-08-03 LAB — CBC WITH DIFFERENTIAL/PLATELET
Basophils Absolute: 0.1 10*3/uL (ref 0.0–0.1)
Basophils Relative: 0.9 % (ref 0.0–3.0)
Eosinophils Absolute: 0.4 10*3/uL (ref 0.0–0.7)
Eosinophils Relative: 6.2 % — ABNORMAL HIGH (ref 0.0–5.0)
HCT: 33.7 % — ABNORMAL LOW (ref 36.0–46.0)
Hemoglobin: 11.3 g/dL — ABNORMAL LOW (ref 12.0–15.0)
Lymphocytes Relative: 23.5 % (ref 12.0–46.0)
Lymphs Abs: 1.7 10*3/uL (ref 0.7–4.0)
MCHC: 33.7 g/dL (ref 30.0–36.0)
MCV: 86.6 fl (ref 78.0–100.0)
Monocytes Absolute: 0.9 10*3/uL (ref 0.1–1.0)
Monocytes Relative: 11.8 % (ref 3.0–12.0)
Neutro Abs: 4.2 10*3/uL (ref 1.4–7.7)
Neutrophils Relative %: 57.6 % (ref 43.0–77.0)
Platelets: 191 10*3/uL (ref 150.0–400.0)
RBC: 3.89 Mil/uL (ref 3.87–5.11)
RDW: 13.7 % (ref 11.5–15.5)
WBC: 7.3 10*3/uL (ref 4.0–10.5)

## 2022-08-03 LAB — BASIC METABOLIC PANEL
BUN: 53 mg/dL — ABNORMAL HIGH (ref 6–23)
CO2: 26 mEq/L (ref 19–32)
Calcium: 9.8 mg/dL (ref 8.4–10.5)
Chloride: 105 mEq/L (ref 96–112)
Creatinine, Ser: 1.96 mg/dL — ABNORMAL HIGH (ref 0.40–1.20)
GFR: 25.75 mL/min — ABNORMAL LOW (ref >60.00)
Glucose, Bld: 79 mg/dL (ref 70–99)
Potassium: 4.2 mEq/L (ref 3.5–5.1)
Sodium: 141 mEq/L (ref 135–145)

## 2022-08-03 LAB — MAGNESIUM: Magnesium: 2 mg/dL (ref 1.5–2.5)

## 2022-08-03 NOTE — Telephone Encounter (Signed)
Pt saw her PCP, Dr. Scarlette Calico, yesterday he referred her to [facility]. She has not heard from that office but will call us once she schedules an appointment with them.  Dr. Berline Lopes aware

## 2022-08-05 ENCOUNTER — Other Ambulatory Visit: Payer: Self-pay | Admitting: Internal Medicine

## 2022-08-05 DIAGNOSIS — E785 Hyperlipidemia, unspecified: Secondary | ICD-10-CM

## 2022-08-05 MED ORDER — ROSUVASTATIN CALCIUM 10 MG PO TABS: 10.0000 mg | ORAL_TABLET | Freq: Every day | ORAL | 0 refills | Status: DC

## 2022-08-17 ENCOUNTER — Telehealth: Payer: Self-pay

## 2022-08-17 NOTE — Telephone Encounter (Signed)
Patient recently referred by nephrologist Dr. Wallace Keller for Safe Start program. [facility] renal coordinator competed assessment regarding renal resources available. Patient interested in renal education, ACP, and nutrition. [facility] meals referral request sent over 07/27/22 & 08/16/22 to assist with diet.   [place] Renal Coordinator [phone]

## 2022-08-21 ENCOUNTER — Other Ambulatory Visit: Payer: Self-pay | Admitting: Internal Medicine

## 2022-08-21 DIAGNOSIS — I10 Essential (primary) hypertension: Secondary | ICD-10-CM

## 2022-08-29 ENCOUNTER — Telehealth: Payer: Self-pay | Admitting: Internal Medicine

## 2022-08-29 NOTE — Telephone Encounter (Signed)
Patient stopped by states her shot was billed incorrectly, she left a copy of the bill she has, says it should have been billed as a prescription. [facility] will be placed in the providers box at the front.

## 2022-09-01 ENCOUNTER — Other Ambulatory Visit: Payer: Self-pay | Admitting: Internal Medicine

## 2022-09-01 DIAGNOSIS — K8020 Calculus of gallbladder without cholecystitis without obstruction: Secondary | ICD-10-CM | POA: Diagnosis not present

## 2022-09-01 DIAGNOSIS — D538 Other specified nutritional anemias: Secondary | ICD-10-CM

## 2022-09-06 ENCOUNTER — Encounter: Payer: Self-pay | Admitting: Internal Medicine

## 2022-09-06 ENCOUNTER — Ambulatory Visit (INDEPENDENT_AMBULATORY_CARE_PROVIDER_SITE_OTHER): Payer: Medicare Other | Admitting: Internal Medicine

## 2022-09-06 ENCOUNTER — Ambulatory Visit (INDEPENDENT_AMBULATORY_CARE_PROVIDER_SITE_OTHER): Payer: Medicare Other

## 2022-09-06 VITALS — BP 136/66 | HR 75 | Temp 98.0°F | Resp 16 | Ht 71.0 in

## 2022-09-06 DIAGNOSIS — M109 Gout, unspecified: Secondary | ICD-10-CM | POA: Diagnosis not present

## 2022-09-06 DIAGNOSIS — M25561 Pain in right knee: Secondary | ICD-10-CM | POA: Diagnosis not present

## 2022-09-06 DIAGNOSIS — M1711 Unilateral primary osteoarthritis, right knee: Secondary | ICD-10-CM | POA: Diagnosis not present

## 2022-09-06 DIAGNOSIS — M25461 Effusion, right knee: Secondary | ICD-10-CM

## 2022-09-06 DIAGNOSIS — R600 Localized edema: Secondary | ICD-10-CM | POA: Diagnosis not present

## 2022-09-06 LAB — CBC WITH DIFFERENTIAL/PLATELET
Basophils Absolute: 0 10*3/uL (ref 0.0–0.1)
Basophils Relative: 0.4 % (ref 0.0–3.0)
Eosinophils Absolute: 0.2 10*3/uL (ref 0.0–0.7)
Eosinophils Relative: 3.9 % (ref 0.0–5.0)
HCT: 34.8 % — ABNORMAL LOW (ref 36.0–46.0)
Hemoglobin: 11.6 g/dL — ABNORMAL LOW (ref 12.0–15.0)
Lymphocytes Relative: 25.1 % (ref 12.0–46.0)
Lymphs Abs: 1.6 10*3/uL (ref 0.7–4.0)
MCHC: 33.4 g/dL (ref 30.0–36.0)
MCV: 86.6 fl (ref 78.0–100.0)
Monocytes Absolute: 0.7 10*3/uL (ref 0.1–1.0)
Monocytes Relative: 10.2 % (ref 3.0–12.0)
Neutro Abs: 3.9 10*3/uL (ref 1.4–7.7)
Neutrophils Relative %: 60.4 % (ref 43.0–77.0)
Platelets: 205 10*3/uL (ref 150.0–400.0)
RBC: 4.02 Mil/uL (ref 3.87–5.11)
RDW: 13.7 % (ref 11.5–15.5)
WBC: 6.4 10*3/uL (ref 4.0–10.5)

## 2022-09-06 LAB — C-REACTIVE PROTEIN: CRP: 7 mg/dL (ref 0.5–20.0)

## 2022-09-06 LAB — URIC ACID: Uric Acid, Serum: 8.5 mg/dL — ABNORMAL HIGH (ref 2.4–7.0)

## 2022-09-06 MED ORDER — METHYLPREDNISOLONE 4 MG PO TBPK: ORAL_TABLET | ORAL | 0 refills | Status: AC

## 2022-09-06 MED ORDER — COLCHICINE 0.6 MG PO TABS: 0.6000 mg | ORAL_TABLET | Freq: Every day | ORAL | 0 refills | Status: DC

## 2022-09-06 MED ORDER — OXYCODONE HCL 5 MG PO TABS: 5.0000 mg | ORAL_TABLET | Freq: Four times a day (QID) | ORAL | 0 refills | Status: AC | PRN

## 2022-09-06 MED ORDER — OXYCODONE HCL 5 MG PO TABS: 5.0000 mg | ORAL_TABLET | Freq: Four times a day (QID) | ORAL | 0 refills | Status: DC | PRN

## 2022-09-06 NOTE — Patient Instructions (Signed)

## 2022-09-06 NOTE — Progress Notes (Unsigned)
Subjective:  Patient ID: Kimberly [name], female    DOB: 29-Jan-1956  Age: 67 y.o. MRN: [id number]  CC: Hypertension, Diabetes, and Osteoarthritis   HPI Kimberly [name] presents for f/up ----  She complains of a 3-day history of pain, redness, swelling, and decreased range of motion in her right knee.  She denies trauma or injury.  She has a history of gout.  She has tried Tylenol for the pain without much relief.  Outpatient Medications Prior to Visit  Medication Sig Dispense Refill   albuterol (VENTOLIN HFA) 108 (90 Base) MCG/ACT inhaler Inhale 2 puffs into the lungs every 4 (four) hours as needed. ADDITIONAL REFILLS FROM PCP 1 each 0   amLODipine (NORVASC) 10 MG tablet TAKE 1 TABLET BY MOUTH DAILY 90 tablet 0   aspirin EC 81 MG tablet Take 81 mg by mouth daily. Swallow whole.     Blood Glucose Monitoring Suppl (CONTOUR NEXT MONITOR) w/Device KIT Use to test blood sugars 4 times daily. Dx: e11.9 1 kit 1   cetirizine (ZYRTEC) 10 MG tablet Take 1 tablet (10 mg total) by mouth daily. 30 tablet 0   chlorthalidone (HYGROTON) 25 MG tablet TAKE ONE TABLET BY MOUTH DAILY 90 tablet 1   Finerenone (KERENDIA) 20 MG TABS Take 1 tablet by mouth daily. 90 tablet 1   fluticasone (FLONASE) 50 MCG/ACT nasal spray Place 1 spray into both nostrils daily. 16 g 0   glucose blood (CONTOUR NEXT TEST) test strip Use to test blood sugars 4 times daily. Dx:e11.9 400 each 12   insulin glargine (LANTUS SOLOSTAR) 100 UNIT/ML Solostar Pen Inject 30 Units into the skin daily. 72 mL 1   Insulin Pen Needle (BD PEN NEEDLE NANO 2ND GEN) 32G X 4 MM MISC USE AS DIRECTED ONCE DAILY 100 each 1   Lancets 30G MISC Use to check blood sugars 4 times daily. Dx:e11.9. please let us know if not covered 400 each 12   losartan (COZAAR) 50 MG tablet TAKE ONE TABLET BY MOUTH DAILY 90 tablet 3   rosuvastatin (CRESTOR) 10 MG tablet Take 1 tablet (10 mg total) by mouth daily. 90 tablet 0   zinc gluconate 50 MG tablet TAKE 1 TABLET  BY  MOUTH DAILY 90 tablet 1   No facility-administered medications prior to visit.    ROS Review of Systems  Constitutional:  Positive for chills. Negative for fatigue and fever.  HENT: Negative.    Eyes: Negative.   Respiratory:  Negative for cough, chest tightness and wheezing.   Cardiovascular:  Negative for chest pain, palpitations and leg swelling.  Gastrointestinal:  Negative for abdominal pain, constipation, diarrhea, nausea and vomiting.  Endocrine: Negative.   Genitourinary: Negative.  Negative for difficulty urinating.  Musculoskeletal:  Positive for arthralgias and joint swelling. Negative for gait problem, myalgias and neck pain.  Skin: Negative.   Neurological: Negative.  Negative for dizziness.  Hematological:  Negative for adenopathy. Does not bruise/bleed easily.  Psychiatric/Behavioral: Negative.      Objective:  BP 136/66 (BP Location: Right Arm, Patient Position: Sitting, Cuff Size: Large)   Pulse 75   Temp 98 F (36.7 C) (Oral)   Resp 16   Ht '5\' 11"'$  (1.803 m)   SpO2 95%   BMI 25.38 kg/m   BP Readings from Last 3 Encounters:  09/06/22 136/66  08/02/22 128/68  07/15/22 (!) 143/61    Wt Readings from Last 3 Encounters:  08/02/22 182 lb (82.6 kg)  07/15/22 183 lb 9.6 oz (  83.3 kg)  06/24/22 178 lb (80.7 kg)    Physical Exam Vitals reviewed.  Constitutional:      General: She is not in acute distress.    Appearance: She is ill-appearing. She is not toxic-appearing or diaphoretic.  HENT:     Mouth/Throat:     Mouth: Mucous membranes are moist.  Eyes:     General: No scleral icterus.    Conjunctiva/sclera: Conjunctivae normal.  Cardiovascular:     Rate and Rhythm: Normal rate and regular rhythm.     Heart sounds: No murmur heard. Pulmonary:     Effort: Pulmonary effort is normal.     Breath sounds: No stridor. No wheezing, rhonchi or rales.  Abdominal:     General: Abdomen is flat.     Palpations: There is no mass.     Tenderness: There is no  abdominal tenderness. There is no guarding.     Hernia: No hernia is present.  Musculoskeletal:        General: Swelling, tenderness and deformity present.     Cervical back: Neck supple.     Right knee: Swelling, erythema and bony tenderness present. No deformity, effusion, ecchymosis or crepitus. Decreased range of motion. Tenderness present.     Left knee: Normal.     Right lower leg: No edema.     Left lower leg: No edema.     Comments: She is in a wheelchair  Lymphadenopathy:     Cervical: No cervical adenopathy.  Skin:    General: Skin is warm and dry.  Neurological:     General: No focal deficit present.     Mental Status: She is alert.     Lab Results  Component Value Date   WBC 6.4 09/06/2022   HGB 11.6 (L) 09/06/2022   HCT 34.8 (L) 09/06/2022   PLT 205.0 Repeated and verified X2. 09/06/2022   GLUCOSE 79 08/02/2022   CHOL 179 02/09/2022   TRIG 202.0 (H) 02/09/2022   HDL 41.40 02/09/2022   LDLDIRECT 94.0 02/09/2022   LDLCALC 138 (H) 01/28/2021   ALT 15 07/22/2021   AST 20 07/22/2021   NA 141 08/02/2022   K 4.2 08/02/2022   CL 105 08/02/2022   CREATININE 1.96 (H) 08/02/2022   BUN 53 (H) 08/02/2022   CO2 26 08/02/2022   TSH 1.88 07/22/2021   HGBA1C 7.5 (H) 06/08/2022   MICROALBUR 12.3 (H) 02/09/2022    CT Abdomen Pelvis Wo Contrast  Result Date: 07/27/2022 CLINICAL DATA:  Acute left-sided abdominal pain. EXAM: CT ABDOMEN AND PELVIS WITHOUT CONTRAST TECHNIQUE: Multidetector CT imaging of the abdomen and pelvis was performed following the standard protocol without IV contrast. RADIATION DOSE REDUCTION: This exam was performed according to the departmental dose-optimization program which includes automated exposure control, adjustment of the mA and/or kV according to patient size and/or use of iterative reconstruction technique. COMPARISON:  December 16, 2020. FINDINGS: Lower chest: No acute abnormality. Hepatobiliary: Cholelithiasis. No biliary dilatation is noted. The  liver is unremarkable on these unenhanced images. Pancreas: Unremarkable. No pancreatic ductal dilatation or surrounding inflammatory changes. Spleen: Normal in size without focal abnormality. Adrenals/Urinary Tract: Adrenal glands are unremarkable. Stable small nonobstructive left renal calculus. No hydronephrosis or renal obstruction is noted. Bladder is unremarkable. Stomach/Bowel: Stomach is within normal limits. Appendix appears normal. No evidence of bowel wall thickening, distention, or inflammatory changes. Vascular/Lymphatic: Aortic atherosclerosis. No enlarged abdominal or pelvic lymph nodes. Reproductive: Status post hysterectomy. No left adnexal abnormality is noted. 6 cm right adnexal  cyst is noted which is slightly increased compared to prior exam. Other: No abdominal wall hernia or abnormality. No abdominopelvic ascites. Musculoskeletal: No acute or significant osseous findings. IMPRESSION: Cholelithiasis. Stable nonobstructive left renal calculus. No hydronephrosis or renal obstruction is noted. 6 cm right adnexal cyst is noted which is slightly enlarged compared to prior exam. Recommend follow-up US in 6-12 months. Note: This recommendation does not apply to premenarchal patients and to those with increased risk (genetic, family history, elevated tumor markers or other high-risk factors) of ovarian cancer. Reference: JACR 2020 Feb; 17(2):248-254. Aortic Atherosclerosis (ICD10-I70.0). Electronically Signed   By: Marijo Conception M.D.   On: 07/27/2022 14:56   DG Knee Complete 4 Views Right  Result Date: 09/06/2022 CLINICAL DATA:  Knee pain and swelling for 4 days, no known injury EXAM: RIGHT KNEE - COMPLETE 4+ VIEW COMPARISON:  08/20/2020 FINDINGS: No fracture or dislocation of the right knee. Mild arthrosis of the femorotibial compartments with severe arthrosis of the patellofemoral compartment and large knee joint effusion. Vascular calcinosis. Soft tissue edema anteriorly. IMPRESSION: 1. No  fracture or dislocation of the right knee. 2. Mild arthrosis of the femorotibial compartments with severe arthrosis of the patellofemoral compartment and large knee joint effusion. 3. Soft tissue edema anteriorly. Electronically Signed   By: Delanna Ahmadi M.D.   On: 09/06/2022 14:18     Assessment & Plan:   Meghan was seen today for hypertension, diabetes and osteoarthritis.  Diagnoses and all orders for this visit:  Pain and swelling of right knee- Symptoms, exam, x-ray, and labs are consistent with acute gouty arthropathy.  Will treat with methylprednisolone, oxycodone, and colchicine.  When the acute gouty flare has resolved I will start allopurinol. -     Ambulatory referral to Sports Medicine -     DG Knee Complete 4 Views Right; Future -     CBC with Differential/Platelet; Future -     C-reactive protein; Future -     Uric acid; Future -     methylPREDNISolone (MEDROL DOSEPAK) 4 MG TBPK tablet; TAKE AS DIRECTED -     Uric acid -     C-reactive protein -     CBC with Differential/Platelet  Primary osteoarthritis of right knee -     methylPREDNISolone (MEDROL DOSEPAK) 4 MG TBPK tablet; TAKE AS DIRECTED -     Discontinue: oxyCODONE (OXY IR/ROXICODONE) 5 MG immediate release tablet; Take 1 tablet (5 mg total) by mouth every 6 (six) hours as needed for up to 8 days for severe pain. -     oxyCODONE (OXY IR/ROXICODONE) 5 MG immediate release tablet; Take 1 tablet (5 mg total) by mouth every 6 (six) hours as needed for up to 8 days for severe pain.  Gouty arthritis of right knee -     colchicine 0.6 MG tablet; Take 1 tablet (0.6 mg total) by mouth daily.   I am having Mylani A. Ribaudo start on methylPREDNISolone and colchicine. I am also having her maintain her Contour Next Monitor, cetirizine, fluticasone, Contour Next Test, Lancets 30G, albuterol, aspirin EC, BD Pen Needle Nano 2nd Gen, losartan, Lantus SoloStar, Kerendia, chlorthalidone, rosuvastatin, amLODipine, zinc gluconate, and  oxyCODONE.  Meds ordered this encounter  Medications   methylPREDNISolone (MEDROL DOSEPAK) 4 MG TBPK tablet    Sig: TAKE AS DIRECTED    Dispense:  21 tablet    Refill:  0   DISCONTD: oxyCODONE (OXY IR/ROXICODONE) 5 MG immediate release tablet    Sig: Take 1 tablet (  5 mg total) by mouth every 6 (six) hours as needed for up to 8 days for severe pain.    Dispense:  30 tablet    Refill:  0   oxyCODONE (OXY IR/ROXICODONE) 5 MG immediate release tablet    Sig: Take 1 tablet (5 mg total) by mouth every 6 (six) hours as needed for up to 8 days for severe pain.    Dispense:  30 tablet    Refill:  0   colchicine 0.6 MG tablet    Sig: Take 1 tablet (0.6 mg total) by mouth daily.    Dispense:  90 tablet    Refill:  0     Follow-up: Return in about 3 months (around 12/07/2022).  Scarlette Calico, MD

## 2022-09-07 NOTE — Progress Notes (Unsigned)
[place] Phone: [phone] Subjective:    I'm seeing this patient by the request  of:  Janith Lima, MD  CC: Right knee pain and swelling  [id number]  Kimberly [name] is a 67 y.o. female coming in with complaint of R knee pain. Patient states that she started having pain one week ago with swelling. States that she has intermittent swelling for past few years in R knee. Pain this week has been the worst it has been. Was using a stick at home for balance. Pain has improved.   Xray R knee 09/06/2022 IMPRESSION: 1. No fracture or dislocation of the right knee. 2. Mild arthrosis of the femorotibial compartments with severe arthrosis of the patellofemoral compartment and large knee joint effusion. 3. Soft tissue edema anteriorly.     Past Medical History:  Diagnosis Date   Arthritis    Carotid bruit 03/21/2022   Cataract    OD   Chronic kidney disease    Diabetes mellitus 2001   Hyperlipidemia Dx 2012   Lower extremity edema 01/06/2021   Past Surgical History:  Procedure Laterality Date   CATARACT EXTRACTION Left    EYE SURGERY Left    Cat Sx   VAGINAL HYSTERECTOMY     AUB (surgery in [place]), prior to 2000   Social History   Socioeconomic History   Marital status: Divorced    Spouse name: Not on file   Number of children: Not on file   Years of education: Not on file   Highest education level: Not on file  Occupational History   Occupation: retired  Tobacco Use   Smoking status: Never   Smokeless tobacco: Never  Vaping Use   Vaping Use: Never used  Substance and Sexual Activity   Alcohol use: Yes    Alcohol/week: 4.0 standard drinks of alcohol    Types: 4 Glasses of wine per week    Comment: occ   Drug use: No   Sexual activity: Not Currently    Partners: Female  Other Topics Concern   Not on file  Social History Narrative   Not on file   Social Determinants of Health    Financial Resource Strain: Low Risk  (11/09/2021)   Overall Financial Resource Strain (CARDIA)    Difficulty of Paying Living Expenses: Not hard at all  Food Insecurity: No Food Insecurity (06/28/2022)   Hunger Vital Sign    Worried About Running Out of Food in the Last Year: Never true    Ran Out of Food in the Last Year: Never true  Transportation Needs: No Transportation Needs (06/28/2022)   PRAPARE - Hydrologist (Medical): No    Lack of Transportation (Non-Medical): No  Physical Activity: Sufficiently Active (11/09/2021)   Exercise Vital Sign    Days of Exercise per Week: 5 days    Minutes of Exercise per Session: 30 min  Stress: No Stress Concern Present (11/09/2021)   [place]    Feeling of Stress : Not at all  Social Connections: Moderately Integrated (11/09/2021)   Social Connection and Isolation Panel [NHANES]    Frequency of Communication with Friends and Family: More than three times a week    Frequency of Social Gatherings with Friends and Family: Not on file    Attends Religious Services: More than 4 times per year    Active Member of [employer]  or Organizations: Yes    Attends Music therapist: More than 4 times per year    Marital Status: Divorced   Allergies  Allergen Reactions   Semaglutide(0.25 Or 0.'5mg'$ -Dos) Nausea Only   Ace Inhibitors Cough   Family History  Problem Relation Age of Onset   Depression Mother    Hypertension Mother    Diabetes Mother    Heart disease Father    Heart attack Father 33   Uterine cancer Sister    Breast cancer Sister    Lung cancer Brother    Prostate cancer Neg Hx    Endometrial cancer Neg Hx    Ovarian cancer Neg Hx    Colon cancer Neg Hx    Pancreatic cancer Neg Hx     Current Outpatient Medications (Endocrine & Metabolic):    Finerenone (KERENDIA) 20 MG TABS, Take 1 tablet by mouth daily.   insulin glargine (LANTUS  SOLOSTAR) 100 UNIT/ML Solostar Pen, Inject 30 Units into the skin daily.   methylPREDNISolone (MEDROL DOSEPAK) 4 MG TBPK tablet, TAKE AS DIRECTED  Current Outpatient Medications (Cardiovascular):    amLODipine (NORVASC) 10 MG tablet, TAKE 1 TABLET BY MOUTH DAILY   chlorthalidone (HYGROTON) 25 MG tablet, TAKE ONE TABLET BY MOUTH DAILY   losartan (COZAAR) 50 MG tablet, TAKE ONE TABLET BY MOUTH DAILY   rosuvastatin (CRESTOR) 10 MG tablet, Take 1 tablet (10 mg total) by mouth daily.  Current Outpatient Medications (Respiratory):    albuterol (VENTOLIN HFA) 108 (90 Base) MCG/ACT inhaler, Inhale 2 puffs into the lungs every 4 (four) hours as needed. ADDITIONAL REFILLS FROM PCP   cetirizine (ZYRTEC) 10 MG tablet, Take 1 tablet (10 mg total) by mouth daily.   fluticasone (FLONASE) 50 MCG/ACT nasal spray, Place 1 spray into both nostrils daily.  Current Outpatient Medications (Analgesics):    aspirin EC 81 MG tablet, Take 81 mg by mouth daily. Swallow whole.   colchicine 0.6 MG tablet, Take 1 tablet (0.6 mg total) by mouth daily.   oxyCODONE (OXY IR/ROXICODONE) 5 MG immediate release tablet, Take 1 tablet (5 mg total) by mouth every 6 (six) hours as needed for up to 8 days for severe pain.   Current Outpatient Medications (Other):    AMBULATORY NON FORMULARY MEDICATION, 1 Units by Other route once for 1 dose.   Blood Glucose Monitoring Suppl (CONTOUR NEXT MONITOR) w/Device KIT, Use to test blood sugars 4 times daily. Dx: e11.9   glucose blood (CONTOUR NEXT TEST) test strip, Use to test blood sugars 4 times daily. Dx:e11.9   Insulin Pen Needle (BD PEN NEEDLE NANO 2ND GEN) 32G X 4 MM MISC, USE AS DIRECTED ONCE DAILY   Lancets 30G MISC, Use to check blood sugars 4 times daily. Dx:e11.9. please let us know if not covered   zinc gluconate 50 MG tablet, TAKE 1 TABLET  BY MOUTH DAILY   Reviewed prior external information including notes and imaging from  primary care provider As well as notes that  were available from care everywhere and other healthcare systems.  Past medical history, social, surgical and family history all reviewed in electronic medical record.  No pertanent information unless stated regarding to the chief complaint.   Review of Systems:  No headache, visual changes, nausea, vomiting, diarrhea, constipation, dizziness, abdominal pain, skin rash, fevers, chills, night sweats, weight loss, swollen lymph nodes, body aches, joint swelling, chest pain, shortness of breath, mood changes. POSITIVE muscle aches  Objective  Blood pressure 132/72, pulse 69, height  $'5\' 11"'f$  (1.803 m), weight 182 lb (82.6 kg), SpO2 97 %.   General: No apparent distress alert and oriented x3 mood and affect normal, dressed appropriately.  HEENT: Pupils equal, extraocular movements intact  Respiratory: Patient's speak in full sentences and does not appear short of breath  Patient does have swelling of the right lower extremity.  Tender to palpation in the right calf.  No sign of any erythema.  Patient's knee noted warm to touch. Big effusion of the knee noted.  Some instability noted of the valgus and varus.  Lateral tracking noted and positive grind test noted.   Procedure: Real-time Ultrasound Guided Injection of right knee Device: GE Logiq Q7 Ultrasound guided injection is preferred based studies that show increased duration, increased effect, greater accuracy, decreased procedural pain, increased response rate, and decreased cost with ultrasound guided versus blind injection.  Verbal informed consent obtained.  Time-out conducted.  Noted no overlying erythema, induration, or other signs of local infection.  Skin prepped in a sterile fashion.  Local anesthesia: Topical Ethyl chloride.  With sterile technique and under real time ultrasound guidance: With a 22-gauge 2 inch needle patient was injected with 4 cc of 0.5% Marcaine and aspirated 65 cc of straw light-colored fluid with mild loose bodies  then injected with 1 cc of Kenalog 40 mg/dL. This was from a superior lateral approach.  Completed without difficulty  Pain immediately improved suggesting accurate placement of the medication.  Advised to call if fevers/chills, erythema, induration, drainage, or persistent bleeding.  Impression: Technically successful ultrasound guided injection.   Impression and Recommendations:    The above documentation has been reviewed and is accurate and complete Lyndal Pulley, DO

## 2022-09-08 ENCOUNTER — Other Ambulatory Visit: Payer: Self-pay

## 2022-09-08 ENCOUNTER — Encounter: Payer: Self-pay | Admitting: Family Medicine

## 2022-09-08 ENCOUNTER — Ambulatory Visit: Payer: Medicare Other | Admitting: Family Medicine

## 2022-09-08 VITALS — BP 132/72 | HR 69 | Ht 71.0 in | Wt 182.0 lb

## 2022-09-08 DIAGNOSIS — M25561 Pain in right knee: Secondary | ICD-10-CM | POA: Diagnosis not present

## 2022-09-08 DIAGNOSIS — M79661 Pain in right lower leg: Secondary | ICD-10-CM

## 2022-09-08 DIAGNOSIS — M1711 Unilateral primary osteoarthritis, right knee: Secondary | ICD-10-CM

## 2022-09-08 MED ORDER — AMBULATORY NON FORMULARY MEDICATION: 1.0000 [IU] | Freq: Once | 0 refills | Status: AC

## 2022-09-08 MED ORDER — AMBULATORY NON FORMULARY MEDICATION: 1.0000 [IU] | Freq: Once | 0 refills | Status: DC

## 2022-09-08 NOTE — Patient Instructions (Addendum)
Drained knee today Exercises Ice 20 min 2x a day Some of swelling is from arthritis [facility]  ([facility] [employer] [facility]) [address] [place], [place] [address] [place] [address] [place], [place] [address] [place] not file insurance [address] [place],  [address] [phone]  See me again in 2 months

## 2022-09-08 NOTE — Addendum Note (Signed)
Addended by: Carmie Kanner on: 09/08/2022 01:07 PM   Modules accepted: Orders

## 2022-09-08 NOTE — Assessment & Plan Note (Addendum)
Aspiration done today, patient does have swelling of the lower extremity.  Believe this is secondary to underlying arthritis but there is a questionable gout flare that could be contributing to this as well. Due to patient's other comorbidities very significantly difficult to treat.  Patient does have significant lower extremity swelling noted that makes me concerned as well.  Will get D-dimer but with chronic kidney disease likely would be falsely elevated so we will get a Doppler to rule out blood clots with patient also having significant diabetes and recent left ovarian mass that could be contributing.  Patient will more bracing and given a cane, discussed some mild home exercises and icing regimen.  Follow-up with me again 6 to 8 weeks

## 2022-09-09 ENCOUNTER — Ambulatory Visit (HOSPITAL_COMMUNITY)
Admission: RE | Admit: 2022-09-09 | Discharge: 2022-09-09 | Disposition: A | Discharge status: Home / Self Care | Payer: Medicare Other | Source: Ambulatory Visit | Attending: Family Medicine | Admitting: Family Medicine

## 2022-09-09 DIAGNOSIS — M79661 Pain in right lower leg: Secondary | ICD-10-CM | POA: Insufficient documentation

## 2022-09-09 LAB — SYNOVIAL FLUID ANALYSIS, COMPLETE
Basophils, %: 0 %
Eosinophils-Synovial: 0 % (ref 0–2)
Lymphocytes-Synovial Fld: 23 % (ref 0–74)
Monocyte/Macrophage: 3 % (ref 0–69)
Neutrophil, Synovial: 74 % — ABNORMAL HIGH (ref 0–24)
Synoviocytes, %: 0 % (ref 0–15)
WBC, Synovial: 1134 cells/uL — ABNORMAL HIGH (ref <150)

## 2022-09-09 MED ORDER — DOXYCYCLINE HYCLATE 100 MG PO TABS: 100.0000 mg | ORAL_TABLET | Freq: Two times a day (BID) | ORAL | 0 refills | Status: AC

## 2022-09-09 NOTE — Addendum Note (Signed)
Addended by: Lyndal Pulley on: 09/09/2022 09:19 AM   Modules accepted: Orders

## 2022-09-12 ENCOUNTER — Telehealth: Payer: Self-pay | Admitting: Cardiovascular Disease

## 2022-09-12 NOTE — Telephone Encounter (Signed)
Patient states she has been on pain medication and she would like to know if this will interfere with her appointment on 3/19 with Dr. Oval Linsey. Please advise.

## 2022-09-13 ENCOUNTER — Ambulatory Visit (HOSPITAL_BASED_OUTPATIENT_CLINIC_OR_DEPARTMENT_OTHER): Payer: Medicare Other | Admitting: Cardiovascular Disease

## 2022-09-13 ENCOUNTER — Encounter (HOSPITAL_BASED_OUTPATIENT_CLINIC_OR_DEPARTMENT_OTHER): Payer: Self-pay | Admitting: Cardiovascular Disease

## 2022-09-13 VITALS — BP 144/68 | HR 67 | Ht 71.0 in | Wt 186.5 lb

## 2022-09-13 DIAGNOSIS — E785 Hyperlipidemia, unspecified: Secondary | ICD-10-CM

## 2022-09-13 NOTE — Patient Instructions (Addendum)
Medication Instructions:  Your physician recommends that you continue on your current medications as directed. Please refer to the Current Medication list given to you today.  *If you need a refill on your cardiac medications before your next appointment, please call your pharmacy*  Lab Work: NONE  Testing/Procedures: NONE  Follow-Up: At [facility], you and your health needs are our priority.  As part of our continuing mission to provide you with exceptional heart care, we have created designated Provider Care Teams.  These Care Teams include your primary Cardiologist (physician) and Advanced Practice Providers (APPs -  Physician Assistants and Nurse Practitioners) who all work together to provide you with the care you need, when you need it.  We recommend signing up for the patient portal called "MyChart".  Sign up information is provided on this After Visit Summary.  MyChart is used to connect with patients for Virtual Visits (Telemedicine).  Patients are able to view lab/test results, encounter notes, upcoming appointments, etc.  Non-urgent messages can be sent to your provider as well.   To learn more about what you can do with MyChart, go to [email or web address].    Your next appointment:   6 month(s)  The format for your next appointment:   In Person  Provider:   DR Loyal have been referred to DR Sadorus. IF IT DOES NOT IMPROVE WHEN YOUR PAIN IMPROVES CALL THE OFFICE TO FOLLOW UP

## 2022-09-13 NOTE — Progress Notes (Deleted)
Cardiology Office Note  Date:  09/13/2022   ID:  Kimberly [name], Kimberly [name] 07-Mar-1956, MRN [id number]  PCP:  Janith Lima, MD  Cardiologist:   Skeet Latch, MD   No chief complaint on file.   History of Present Illness: Kimberly [name] is a 67 y.o. female with hypertension, hyperlipidemia, GERD, CKD 4, diabetes, and cough-variant asthma here for follow up.  She was initially seen  09/2019 for the evaluation of chest pain shortness of breath.  She saw Dr. Martinique 09/2019 and was noted to have a murmur on exam.  She was referred to cardiology for further evaluation.  She reported some intermittent pain in her left arm that has been occurring for several weeks.  It happens at rest or when laying in the bed and last for a few seconds at a time.  Additionally, she notes some chest tightness that occur separately.  She thinks this is related to stress.  She was referred for an ETT 11/14/2019 that was negative for ischemia.  She achieved 8.8 metabolic equivalents.  Her blood pressure increased to 227/63.  She was started on amlodipine.  She checks her BP at home and it runs around 145.  She is tolerating amlodipine well but doesn't want to add any other medication.  At her initial visit, she increased her pravastatin to for 5 days/week, though she did sometimes skip it because it caused muscle aches. Her lipids were not at goal but she was hesitant to increase her pravastatin. She wanted to focus on diet and exercise.    She complained of pain on her L side and bilateral LE edema. The pain radiated from her lower left side/thigh down to her left foot. She went to the ED 12/16/2020 because her symptoms all occurred on her left side. CT scan showed a possible ovarian cyst. She was following her OBGYN who performed an [place] in her office. Around 11/25/2020, she was infected with COVID-19. She noted her blood pressures were high at home but controlled in the office . She was asked to bring blood pressure cuff to  follow up. She reported LE swelling but doppler 12/2020 was negative for DVT. She saw her nephrologist and was started on losartan.  Her blood pressure was uncontrolled so losartan was increased, and HCTZ was switched to chlorthalidone. She wanted to stop the statin, but agreed to take it once per week. She wasn't interested in starting Repatha. She was started on Nexlizet. She saw her PCP the following month and her blood pressure was controlled, however it was high at follow up 01/2022 so amlodipine was restarted.  At her visit on 02/2022 she was working on her stress by exercising.  She was noted to have a carotid bruit and referred for carotid Dopplers 03/2022 which revealed mild stenosis on the left.    Past Medical History:  Diagnosis Date   Arthritis    Carotid bruit 03/21/2022   Cataract    OD   Chronic kidney disease    Diabetes mellitus 2001   Hyperlipidemia Dx 2012   Lower extremity edema 01/06/2021    Past Surgical History:  Procedure Laterality Date   CATARACT EXTRACTION Left    EYE SURGERY Left    Cat Sx   VAGINAL HYSTERECTOMY     AUB (surgery in [place]), prior to 2000     Current Outpatient Medications  Medication Sig Dispense Refill   albuterol (VENTOLIN HFA) 108 (90 Base) MCG/ACT inhaler Inhale 2 puffs into  the lungs every 4 (four) hours as needed. ADDITIONAL REFILLS FROM PCP 1 each 0   amLODipine (NORVASC) 10 MG tablet TAKE 1 TABLET BY MOUTH DAILY 90 tablet 0   aspirin EC 81 MG tablet Take 81 mg by mouth daily. Swallow whole.     Blood Glucose Monitoring Suppl (CONTOUR NEXT MONITOR) w/Device KIT Use to test blood sugars 4 times daily. Dx: e11.9 1 kit 1   cetirizine (ZYRTEC) 10 MG tablet Take 1 tablet (10 mg total) by mouth daily. 30 tablet 0   chlorthalidone (HYGROTON) 25 MG tablet TAKE ONE TABLET BY MOUTH DAILY 90 tablet 1   colchicine 0.6 MG tablet Take 1 tablet (0.6 mg total) by mouth daily. 90 tablet 0   doxycycline (VIBRA-TABS) 100 MG tablet Take 1 tablet (100  mg total) by mouth 2 (two) times daily for 7 days. 14 tablet 0   Finerenone (KERENDIA) 20 MG TABS Take 1 tablet by mouth daily. 90 tablet 1   fluticasone (FLONASE) 50 MCG/ACT nasal spray Place 1 spray into both nostrils daily. 16 g 0   glucose blood (CONTOUR NEXT TEST) test strip Use to test blood sugars 4 times daily. Dx:e11.9 400 each 12   insulin glargine (LANTUS SOLOSTAR) 100 UNIT/ML Solostar Pen Inject 30 Units into the skin daily. 72 mL 1   Insulin Pen Needle (BD PEN NEEDLE NANO 2ND GEN) 32G X 4 MM MISC USE AS DIRECTED ONCE DAILY 100 each 1   Lancets 30G MISC Use to check blood sugars 4 times daily. Dx:e11.9. please let us know if not covered 400 each 12   losartan (COZAAR) 50 MG tablet TAKE ONE TABLET BY MOUTH DAILY 90 tablet 3   oxyCODONE (OXY IR/ROXICODONE) 5 MG immediate release tablet Take 1 tablet (5 mg total) by mouth every 6 (six) hours as needed for up to 8 days for severe pain. 30 tablet 0   rosuvastatin (CRESTOR) 10 MG tablet Take 1 tablet (10 mg total) by mouth daily. 90 tablet 0   zinc gluconate 50 MG tablet TAKE 1 TABLET  BY MOUTH DAILY 90 tablet 1   No current facility-administered medications for this visit.    Allergies:   Semaglutide(0.25 or 0.5mg -dos) and Ace inhibitors    Social History:  The patient  reports that she has never smoked. She has never used smokeless tobacco. She reports current alcohol use of about 4.0 standard drinks of alcohol per week. She reports that she does not use drugs.   Family History:  The patient's family history includes Breast cancer in her sister; Depression in her mother; Diabetes in her mother; Heart attack (age of onset: 6) in her father; Heart disease in her father; Hypertension in her mother; Lung cancer in her brother; Uterine cancer in her sister.    ROS:   Please see the history of present illness. (+) pain in upper half of the abdomen (+) arthritis  (+) joint swelling (+) gout All other systems are reviewed and negative.     PHYSICAL EXAM: VS:  There were no vitals taken for this visit. , BMI There is no height or weight on file to calculate BMI. GENERAL:  Well appearing HEENT: Pupils equal round and reactive, fundi not visualized, oral mucosa unremarkable NECK:  No jugular venous distention, waveform within normal limits, carotid upstroke brisk and symmetric, right carotid bruit  LUNGS:  Clear to auscultation bilaterally HEART:  RRR.  PMI not displaced or sustained,S1 and S2 within normal limits, no S3, no S4,  no clicks, no rubs, no murmurs ABD:  Flat, positive bowel sounds normal in frequency in pitch, no bruits, no rebound, no guarding, no midline pulsatile mass, no hepatomegaly, no splenomegaly EXT:  2 plus pulses throughout, no edema, no cyanosis no clubbing.  SKIN:  No rashes no nodules NEURO:  Cranial nerves II through XII grossly intact, motor grossly intact throughout PSYCH:  Cognitively intact, oriented to person place and time   EKG:  EKG is personally reviewed.  03/15/2022: EKG was not ordered.  05/31/2021: EKG was not ordered  01/06/2021: NSR, 81, inferiorly T wave inversions 10/25/2019: sinus rhythm.  Rate 96 bpm.  LAFB.  LVH with repolarization abnormalities  Carotid Doppler 03/2022: Summary:  Right Carotid: The extracranial vessels were near-normal with only minimal  wall                thickening or plaque.   Left Carotid: Velocities in the left ICA are consistent with a 1-39%  stenosis.   Vertebrals: Right vertebral artery demonstrates antegrade flow. Left  vertebral              antegrade and atypical.  Subclavians: Normal flow hemodynamics were seen in bilateral subclavian               arteries.  Lower Venous DVT 01/06/21 RIGHT:  - No evidence of common femoral vein obstruction.  LEFT:  - No evidence of deep vein thrombosis in the lower extremity. No indirect  evidence of obstruction proximal to the inguinal ligament.  - No cystic structure found in the popliteal fossa.    ETT 11/14/19: There was no ST segment deviation noted during stress. No evidence of ischemia Hypertensive response to exercise, peak BP 227/63  Recent Labs: 08/02/2022: BUN 53; Creatinine, Ser 1.96; Magnesium 2.0; Potassium 4.2; Sodium 141 09/06/2022: Hemoglobin 11.6; Platelets 205.0 Repeated and verified X2.    Lipid Panel    Component Value Date/Time   CHOL 179 02/09/2022 1538   CHOL 212 (H) 01/28/2021 1006   TRIG 202.0 (H) 02/09/2022 1538   HDL 41.40 02/09/2022 1538   HDL 45 01/28/2021 1006   CHOLHDL 4 02/09/2022 1538   VLDL 40.4 (H) 02/09/2022 1538   LDLCALC 138 (H) 01/28/2021 1006   LDLCALC 145 (H) 02/17/2020 0949   LDLDIRECT 94.0 02/09/2022 1538      Wt Readings from Last 3 Encounters:  09/08/22 182 lb (82.6 kg)  08/02/22 182 lb (82.6 kg)  07/15/22 183 lb 9.6 oz (83.3 kg)      ASSESSMENT AND PLAN:  No problem-specific Assessment & Plan notes found for this encounter.    Current medicines are reviewed at length with the patient today.  The patient does not have concerns regarding medicines.  The following changes have been made:  no change  Labs/ tests ordered today include:   No orders of the defined types were placed in this encounter.    Disposition:   FU with Kimberly Kessinger C. Oval Linsey, MD, [facility] in 6 months    I,Jessica Ford,acting as a Education administrator for Skeet Latch, MD.,have documented all relevant documentation on the behalf of Skeet Latch, MD,as directed by  Skeet Latch, MD while in the presence of Skeet Latch, MD.   I, [place] Oval Linsey, MD have reviewed all documentation for this visit.  The documentation of the exam, diagnosis, procedures, and orders on 09/13/2022 are all accurate and complete.   Signed, Nazim Kadlec C. Oval Linsey, MD, [facility]  09/13/2022 7:48 AM    [place]

## 2022-09-13 NOTE — Progress Notes (Signed)
Cardiology Office Note  Date:  09/13/2022   ID:  Kimberly [name], Kimberly [name] May 11, 1956, MRN [id number]  PCP:  Janith Lima, MD  Cardiologist:   Skeet Latch, MD   No chief complaint on file.   History of Present Illness: Kimberly [name] is a 66 y.o. female with hypertension, hyperlipidemia, GERD, CKD 4, diabetes, and cough-variant asthma here for follow up.  She was initially seen  09/2019 for the evaluation of chest pain shortness of breath.  She saw Dr. Martinique 09/2019 and was noted to have a murmur on exam.  She was referred to cardiology for further evaluation.  She reported some intermittent pain in her left arm that has been occurring for several weeks.  It happens at rest or when laying in the bed and last for a few seconds at a time.  Additionally, she notes some chest tightness that occur separately.  She thinks this is related to stress.  She was referred for an ETT 11/14/2019 that was negative for ischemia.  She achieved 8.8 metabolic equivalents.  Her blood pressure increased to 227/63.  She was started on amlodipine.  She checks her BP at home and it runs around 145.  She is tolerating amlodipine well but doesn't want to add any other medication.  At her initial visit, she increased her pravastatin to for 5 days/week, though she did sometimes skip it because it caused muscle aches. Her lipids were not at goal but she was hesitant to increase her pravastatin. She wanted to focus on diet and exercise.    She complained of pain on her L side and bilateral LE edema. The pain radiated from her lower left side/thigh down to her left foot. She went to the ED 12/16/2020 because her symptoms all occurred on her left side. CT scan showed a possible ovarian cyst. She was following her OBGYN who performed an [place] in her office. Around 11/25/2020, she was infected with COVID-19. She noted her blood pressures were high at home but controlled in the office . She was asked to bring blood pressure cuff to  follow up. She reported LE swelling but doppler 12/2020 was negative for DVT. She saw her nephrologist and was started on losartan.  Her blood pressure was uncontrolled so losartan was increased, and HCTZ was switched to chlorthalidone. She wanted to stop the statin, but agreed to take it once per week. She wasn't interested in starting Repatha. She was started on Nexlizet. She saw her PCP the following month and her blood pressure was controlled, however it was high at follow up 01/2022 so amlodipine was restarted.  At her visit on 02/2022 she was working on her stress by exercising.  She was noted to have a carotid bruit and referred for carotid Dopplers 03/2022 which revealed mild stenosis on the left.  Today, she reports that she is not doing so well. She is experiencing a lot of pain for her R knee. She is still on the antibiotics and has a couple of oxytoxin left. Her blood pressure at home when she monitored was around [id number] systolic. It was 160/60 but she attributes this to pain. On retake it was 144/68. Her R leg has some pain in the joints, but this is related to the gout. She has been doing some exercises given to her from the Dr. To work out her leg and also has some other stretches but she has not been able to do them since the joint pain. She  stopped taking the rosuvastatin due to muscle cramps that were keeping her up at night.   She denies any palpitations, chest pain, shortness of breath, or peripheral edema. No lightheadedness, headaches, syncope, orthopnea, or PND.   Past Medical History:  Diagnosis Date   Arthritis    Carotid bruit 03/21/2022   Cataract    OD   Chronic kidney disease    Diabetes mellitus 2001   Hyperlipidemia Dx 2012   Lower extremity edema 01/06/2021    Past Surgical History:  Procedure Laterality Date   CATARACT EXTRACTION Left    EYE SURGERY Left    Cat Sx   VAGINAL HYSTERECTOMY     AUB (surgery in [place]), prior to 2000     Current Outpatient  Medications  Medication Sig Dispense Refill   albuterol (VENTOLIN HFA) 108 (90 Base) MCG/ACT inhaler Inhale 2 puffs into the lungs every 4 (four) hours as needed. ADDITIONAL REFILLS FROM PCP 1 each 0   amLODipine (NORVASC) 10 MG tablet TAKE 1 TABLET BY MOUTH DAILY 90 tablet 0   aspirin EC 81 MG tablet Take 81 mg by mouth daily. Swallow whole.     Blood Glucose Monitoring Suppl (CONTOUR NEXT MONITOR) w/Device KIT Use to test blood sugars 4 times daily. Dx: e11.9 1 kit 1   cetirizine (ZYRTEC) 10 MG tablet Take 1 tablet (10 mg total) by mouth daily. 30 tablet 0   chlorthalidone (HYGROTON) 25 MG tablet TAKE ONE TABLET BY MOUTH DAILY 90 tablet 1   colchicine 0.6 MG tablet Take 1 tablet (0.6 mg total) by mouth daily. 90 tablet 0   doxycycline (VIBRA-TABS) 100 MG tablet Take 1 tablet (100 mg total) by mouth 2 (two) times daily for 7 days. 14 tablet 0   Finerenone (KERENDIA) 20 MG TABS Take 1 tablet by mouth daily. 90 tablet 1   fluticasone (FLONASE) 50 MCG/ACT nasal spray Place 1 spray into both nostrils daily. 16 g 0   glucose blood (CONTOUR NEXT TEST) test strip Use to test blood sugars 4 times daily. Dx:e11.9 400 each 12   insulin glargine (LANTUS SOLOSTAR) 100 UNIT/ML Solostar Pen Inject 30 Units into the skin daily. 72 mL 1   Insulin Pen Needle (BD PEN NEEDLE NANO 2ND GEN) 32G X 4 MM MISC USE AS DIRECTED ONCE DAILY 100 each 1   Lancets 30G MISC Use to check blood sugars 4 times daily. Dx:e11.9. please let us know if not covered 400 each 12   losartan (COZAAR) 50 MG tablet TAKE ONE TABLET BY MOUTH DAILY 90 tablet 3   oxyCODONE (OXY IR/ROXICODONE) 5 MG immediate release tablet Take 1 tablet (5 mg total) by mouth every 6 (six) hours as needed for up to 8 days for severe pain. 30 tablet 0   zinc gluconate 50 MG tablet TAKE 1 TABLET  BY MOUTH DAILY 90 tablet 1   No current facility-administered medications for this visit.    Allergies:   Semaglutide(0.25 or 0.5mg -dos), Ace inhibitors, and  Rosuvastatin    Social History:  The patient  reports that she has never smoked. She has never used smokeless tobacco. She reports current alcohol use of about 4.0 standard drinks of alcohol per week. She reports that she does not use drugs.   Family History:  The patient's family history includes Breast cancer in her sister; Depression in her mother; Diabetes in her mother; Heart attack (age of onset: 26) in her father; Heart disease in her father; Hypertension in her mother; Lung cancer  in her brother; Uterine cancer in her sister.    ROS:   Please see the history of present illness. (+) arthritis  (+) joint swelling (+) gout All other systems are reviewed and negative.    PHYSICAL EXAM: VS:  BP (!) 144/68 (BP Location: Right Arm, Patient Position: Sitting, Cuff Size: Normal)   Pulse 67   Ht 5\' 11"  (1.803 m)   Wt 186 lb 8 oz (84.6 kg)   SpO2 97%   BMI 26.01 kg/m  , BMI Body mass index is 26.01 kg/m. GENERAL:  Well appearing HEENT: Pupils equal round and reactive, fundi not visualized, oral mucosa unremarkable NECK:  No jugular venous distention, waveform within normal limits, carotid upstroke brisk and symmetric, right carotid bruit  LUNGS:  Clear to auscultation bilaterally HEART:  RRR.  PMI not displaced or sustained,S1 and S2 within normal limits, no S3, no S4, no clicks, no rubs, no murmurs ABD:  Flat, positive bowel sounds normal in frequency in pitch, no bruits, no rebound, no guarding, no midline pulsatile mass, no hepatomegaly, no splenomegaly EXT:  2 plus pulses throughout, no edema, no cyanosis no clubbing.  SKIN:  No rashes no nodules NEURO:  Cranial nerves II through XII grossly intact, motor grossly intact throughout PSYCH:  Cognitively intact, oriented to person place and time  EKG:  EKG is personally reviewed.  01/06/2021: NSR, 81, inferiorly T wave inversions 10/25/2019: sinus rhythm.  Rate 96 bpm.  LAFB.  LVH with repolarization abnormalities  Carotid Doppler  03/2022: Summary:  Right Carotid: The extracranial vessels were near-normal with only minimal  wall                thickening or plaque.   Left Carotid: Velocities in the left ICA are consistent with a 1-39%  stenosis.   Vertebrals: Right vertebral artery demonstrates antegrade flow. Left  vertebral              antegrade and atypical.  Subclavians: Normal flow hemodynamics were seen in bilateral subclavian               arteries.  Lower Venous DVT 01/06/21 RIGHT:  - No evidence of common femoral vein obstruction.  LEFT:  - No evidence of deep vein thrombosis in the lower extremity. No indirect  evidence of obstruction proximal to the inguinal ligament.  - No cystic structure found in the popliteal fossa.   ETT 11/14/19: There was no ST segment deviation noted during stress. No evidence of ischemia Hypertensive response to exercise, peak BP 227/63  Recent Labs: 08/02/2022: BUN 53; Creatinine, Ser 1.96; Magnesium 2.0; Potassium 4.2; Sodium 141 09/06/2022: Hemoglobin 11.6; Platelets 205.0 Repeated and verified X2.    Lipid Panel    Component Value Date/Time   CHOL 179 02/09/2022 1538   CHOL 212 (H) 01/28/2021 1006   TRIG 202.0 (H) 02/09/2022 1538   HDL 41.40 02/09/2022 1538   HDL 45 01/28/2021 1006   CHOLHDL 4 02/09/2022 1538   VLDL 40.4 (H) 02/09/2022 1538   LDLCALC 138 (H) 01/28/2021 1006   LDLCALC 145 (H) 02/17/2020 0949   LDLDIRECT 94.0 02/09/2022 1538      Wt Readings from Last 3 Encounters:  09/13/22 186 lb 8 oz (84.6 kg)  09/08/22 182 lb (82.6 kg)  08/02/22 182 lb (82.6 kg)      ASSESSMENT AND PLAN:  # Hypertension: BP elevated today which she attributes to pain 2/2 gout.  Patient's blood pressure has shown improvement both at home and in other  offices. - Plan: Continue current antihypertensive medications including Chlorthalidone, Amlodipine, and Losartan. The patient is advised to monitor blood pressure at home and report if it remains elevated once the  pain is alleviated (>130/80)  # Hyperlipidemia: - Assessment: Patient has not tolerated statin medications, with Rosuvastatin added as an allergy today. - Plan: Refer the patient to Dr. Debara Pickett, a lipid specialist, to explore alternative lipid-lowering treatments such as Inclisiran (Leqvio) injections. Discontinue Rosuvastatin.  # Gout: - Plan: Advise the patient to continue with the prescribed medications for managing recent gout flare-ups and follow up if symptoms worsen or do not improve.   The following changes have been made:  no change  Labs/ tests ordered today include:   Orders Placed This Encounter  Procedures   AMB Referral to [facility]     Disposition:   FU with Safi Culotta C. Oval Linsey, MD, [facility] in 6 months   I,Jessica Ford,acting as a Education administrator for Skeet Latch, MD.,have documented all relevant documentation on the behalf of Skeet Latch, MD,as directed by  Skeet Latch, MD while in the presence of Skeet Latch, MD.   I, [place] Oval Linsey, MD have reviewed all documentation for this visit.  The documentation of the exam, diagnosis, procedures, and orders on 09/13/2022 are all accurate and complete.   Signed, Jkai Arwood C. Oval Linsey, MD, [facility]  09/13/2022 2:22 PM    [place]

## 2022-09-14 ENCOUNTER — Telehealth: Payer: Self-pay

## 2022-09-14 NOTE — Telephone Encounter (Signed)
Patient called renal coordinator as she is active with [facility] [employer] program. Patient shared she missed renal cooking class last week due to gout flare and unable to keep her balance. She wanted to know if a cane could be provided. Currently, pending feedback as to how to provide resource as patient shared the pharmacy prescription sent by PCP is out of network  [place] Renal Coordinator [place] [phone]

## 2022-10-07 DIAGNOSIS — N184 Chronic kidney disease, stage 4 (severe): Secondary | ICD-10-CM | POA: Diagnosis not present

## 2022-10-07 DIAGNOSIS — R809 Proteinuria, unspecified: Secondary | ICD-10-CM | POA: Diagnosis not present

## 2022-10-07 DIAGNOSIS — D631 Anemia in chronic kidney disease: Secondary | ICD-10-CM | POA: Diagnosis not present

## 2022-10-07 DIAGNOSIS — I129 Hypertensive chronic kidney disease with stage 1 through stage 4 chronic kidney disease, or unspecified chronic kidney disease: Secondary | ICD-10-CM | POA: Diagnosis not present

## 2022-10-07 LAB — MICROALBUMIN / CREATININE URINE RATIO: Microalb Creat Ratio: 130

## 2022-10-07 LAB — COMPREHENSIVE METABOLIC PANEL
Albumin: 4.1 (ref 3.5–5.0)
Calcium: 10.1 (ref 8.7–10.7)
eGFR: 25

## 2022-10-07 LAB — CBC AND DIFFERENTIAL
HCT: 36 (ref 36–46)
Hemoglobin: 11.7 — AB (ref 12.0–16.0)
Platelets: 181 10*3/uL (ref 150–400)
WBC: 4.7

## 2022-10-07 LAB — PROTEIN / CREATININE RATIO, URINE
Albumin, U: 128.9
Creatinine, Urine: 98.9

## 2022-10-07 LAB — BASIC METABOLIC PANEL
BUN: 47 — AB (ref 4–21)
CO2: 25 — AB (ref 13–22)
Chloride: 102 (ref 99–108)
Creatinine: 2.1 — AB (ref 0.5–1.1)
Glucose: 241
Potassium: 5 mEq/L (ref 3.5–5.1)
Sodium: 137 (ref 137–147)

## 2022-10-07 LAB — MICROALBUMIN, URINE: Microalb, Ur: 15

## 2022-10-21 ENCOUNTER — Telehealth: Payer: Self-pay | Admitting: Internal Medicine

## 2022-10-21 NOTE — Telephone Encounter (Signed)
Patient dropped off document  Disability Forms , to be filled out by provider. Patient requested to send it via Fax within 2-days. Document is located in providers tray at front office.Please advise at Mobile [phone] (mobile)   Pt would like forms faxed, and to be called when they are ready for pick up.   Please fax to:  [phone]

## 2022-10-24 NOTE — Telephone Encounter (Signed)
PCP had forms.   Forms have previously been done and faxed again.

## 2022-10-25 DIAGNOSIS — K080 Exfoliation of teeth due to systemic causes: Secondary | ICD-10-CM | POA: Diagnosis not present

## 2022-10-27 ENCOUNTER — Ambulatory Visit (INDEPENDENT_AMBULATORY_CARE_PROVIDER_SITE_OTHER): Payer: Medicare Other | Admitting: Internal Medicine

## 2022-10-27 ENCOUNTER — Encounter: Payer: Self-pay | Admitting: Internal Medicine

## 2022-10-27 VITALS — BP 138/78 | HR 60 | Temp 98.2°F | Resp 16 | Ht 71.0 in | Wt 185.0 lb

## 2022-10-27 DIAGNOSIS — E118 Type 2 diabetes mellitus with unspecified complications: Secondary | ICD-10-CM

## 2022-10-27 DIAGNOSIS — E119 Type 2 diabetes mellitus without complications: Secondary | ICD-10-CM | POA: Diagnosis not present

## 2022-10-27 DIAGNOSIS — E1165 Type 2 diabetes mellitus with hyperglycemia: Secondary | ICD-10-CM

## 2022-10-27 DIAGNOSIS — N184 Chronic kidney disease, stage 4 (severe): Secondary | ICD-10-CM

## 2022-10-27 DIAGNOSIS — D631 Anemia in chronic kidney disease: Secondary | ICD-10-CM

## 2022-10-27 DIAGNOSIS — E785 Hyperlipidemia, unspecified: Secondary | ICD-10-CM

## 2022-10-27 DIAGNOSIS — Z794 Long term (current) use of insulin: Secondary | ICD-10-CM | POA: Diagnosis not present

## 2022-10-27 DIAGNOSIS — I10 Essential (primary) hypertension: Secondary | ICD-10-CM | POA: Diagnosis not present

## 2022-10-27 DIAGNOSIS — N1832 Chronic kidney disease, stage 3b: Secondary | ICD-10-CM | POA: Diagnosis not present

## 2022-10-27 DIAGNOSIS — E1121 Type 2 diabetes mellitus with diabetic nephropathy: Secondary | ICD-10-CM

## 2022-10-27 DIAGNOSIS — I739 Peripheral vascular disease, unspecified: Secondary | ICD-10-CM | POA: Insufficient documentation

## 2022-10-27 LAB — LDL CHOLESTEROL, DIRECT: Direct LDL: 162 mg/dL

## 2022-10-27 LAB — TSH: TSH: 1.83 u[IU]/mL (ref 0.35–5.50)

## 2022-10-27 LAB — HEPATIC FUNCTION PANEL
ALT: 16 U/L (ref 0–35)
AST: 16 U/L (ref 0–37)
Albumin: 4.1 g/dL (ref 3.5–5.2)
Alkaline Phosphatase: 51 U/L (ref 39–117)
Bilirubin, Direct: 0.1 mg/dL (ref 0.0–0.3)
Total Bilirubin: 0.4 mg/dL (ref 0.2–1.2)
Total Protein: 6.9 g/dL (ref 6.0–8.3)

## 2022-10-27 LAB — LIPID PANEL
Cholesterol: 275 mg/dL — ABNORMAL HIGH (ref 0–200)
HDL: 44.4 mg/dL (ref >39.00)
NonHDL: 230.66
Total CHOL/HDL Ratio: 6
Triglycerides: 249 mg/dL — ABNORMAL HIGH (ref 0.0–149.0)
VLDL: 49.8 mg/dL — ABNORMAL HIGH (ref 0.0–40.0)

## 2022-10-27 LAB — HEMOGLOBIN A1C: Hgb A1c MFr Bld: 9.7 % — ABNORMAL HIGH (ref 4.6–6.5)

## 2022-10-27 MED ORDER — REPATHA 140 MG/ML ~~LOC~~ SOSY
140.0000 mg | PREFILLED_SYRINGE | SUBCUTANEOUS | 1 refills | Status: DC
Start: 2022-10-27 — End: 2023-03-23

## 2022-10-27 MED ORDER — FREESTYLE LIBRE 3 READER DEVI
1.0000 | Freq: Every day | 3 refills | Status: DC
Start: 2022-10-27 — End: 2022-11-11

## 2022-10-27 MED ORDER — FREESTYLE LIBRE 3 SENSOR MISC
1.0000 | Freq: Every day | 5 refills | Status: DC
Start: 2022-10-27 — End: 2022-11-06

## 2022-10-27 MED ORDER — LANTUS SOLOSTAR 100 UNIT/ML ~~LOC~~ SOPN
50.0000 [IU] | PEN_INJECTOR | Freq: Every day | SUBCUTANEOUS | 1 refills | Status: DC
Start: 2022-10-27 — End: 2023-01-20

## 2022-10-27 MED ORDER — INSULIN LISPRO 200 UNIT/ML ~~LOC~~ SOPN
5.0000 [IU] | PEN_INJECTOR | Freq: Three times a day (TID) | SUBCUTANEOUS | 1 refills | Status: DC
Start: 2022-10-27 — End: 2023-01-16

## 2022-10-27 NOTE — Progress Notes (Signed)
Subjective:  Patient ID: Kimberly [name], female    DOB: 25-Feb-1956  Age: 67 y.o. MRN: [id number]  CC: Hypertension, Diabetes, Anemia, and Hyperlipidemia   HPI Kimberly [name] presents for f/up --   She complains of chronic fatigue but denies chest pain, shortness of breath, diaphoresis, or edema.  She complains of pain in her calves at rest.  Outpatient Medications Prior to Visit  Medication Sig Dispense Refill   albuterol (VENTOLIN HFA) 108 (90 Base) MCG/ACT inhaler Inhale 2 puffs into the lungs every 4 (four) hours as needed. ADDITIONAL REFILLS FROM PCP 1 each 0   amLODipine (NORVASC) 10 MG tablet TAKE 1 TABLET BY MOUTH DAILY 90 tablet 0   aspirin EC 81 MG tablet Take 81 mg by mouth daily. Swallow whole.     Blood Glucose Monitoring Suppl (CONTOUR NEXT MONITOR) w/Device KIT Use to test blood sugars 4 times daily. Dx: e11.9 1 kit 1   cetirizine (ZYRTEC) 10 MG tablet Take 1 tablet (10 mg total) by mouth daily. 30 tablet 0   chlorthalidone (HYGROTON) 25 MG tablet TAKE ONE TABLET BY MOUTH DAILY 90 tablet 1   colchicine 0.6 MG tablet Take 1 tablet (0.6 mg total) by mouth daily. 90 tablet 0   Finerenone (KERENDIA) 20 MG TABS Take 1 tablet by mouth daily. 90 tablet 1   fluticasone (FLONASE) 50 MCG/ACT nasal spray Place 1 spray into both nostrils daily. 16 g 0   glucose blood (CONTOUR NEXT TEST) test strip Use to test blood sugars 4 times daily. Dx:e11.9 400 each 12   Insulin Pen Needle (BD PEN NEEDLE NANO 2ND GEN) 32G X 4 MM MISC USE AS DIRECTED ONCE DAILY 100 each 1   Lancets 30G MISC Use to check blood sugars 4 times daily. Dx:e11.9. please let us know if not covered 400 each 12   losartan (COZAAR) 50 MG tablet TAKE ONE TABLET BY MOUTH DAILY 90 tablet 3   zinc gluconate 50 MG tablet TAKE 1 TABLET  BY MOUTH DAILY 90 tablet 1   insulin glargine (LANTUS SOLOSTAR) 100 UNIT/ML Solostar Pen Inject 30 Units into the skin daily. 72 mL 1   No facility-administered medications prior to visit.     ROS Review of Systems  Constitutional:  Positive for fatigue. Negative for diaphoresis and unexpected weight change.  HENT: Negative.    Eyes: Negative.   Respiratory:  Negative for cough, chest tightness and wheezing.   Cardiovascular:  Negative for chest pain, palpitations and leg swelling.  Gastrointestinal:  Negative for abdominal pain, diarrhea, nausea and vomiting.  Endocrine: Negative.   Genitourinary: Negative.  Negative for difficulty urinating.  Musculoskeletal:  Positive for arthralgias. Negative for joint swelling and myalgias.  Neurological:  Negative for dizziness, weakness and light-headedness.  Hematological:  Negative for adenopathy. Does not bruise/bleed easily.  Psychiatric/Behavioral: Negative.      Objective:  BP 138/78 (BP Location: Right Arm, Patient Position: Sitting, Cuff Size: Large)   Pulse 60   Temp 98.2 F (36.8 C) (Oral)   Ht 5\' 11"  (1.803 m)   SpO2 98%   BMI 26.01 kg/m   BP Readings from Last 3 Encounters:  10/27/22 138/78  09/13/22 (!) 144/68  09/08/22 132/72    Wt Readings from Last 3 Encounters:  09/13/22 186 lb 8 oz (84.6 kg)  09/08/22 182 lb (82.6 kg)  08/02/22 182 lb (82.6 kg)    Physical Exam Vitals reviewed.  Constitutional:      Appearance: Normal appearance.  HENT:  Mouth/Throat:     Mouth: Mucous membranes are moist.  Eyes:     General: No scleral icterus.    Conjunctiva/sclera: Conjunctivae normal.  Cardiovascular:     Rate and Rhythm: Normal rate and regular rhythm.     Heart sounds: No murmur heard. Pulmonary:     Effort: Pulmonary effort is normal.     Breath sounds: No stridor. No wheezing, rhonchi or rales.  Abdominal:     General: Abdomen is flat.     Tenderness: There is no abdominal tenderness. There is no guarding or rebound.     Hernia: No hernia is present.  Musculoskeletal:        General: Normal range of motion.     Cervical back: Neck supple.     Right lower leg: No edema.     Left lower  leg: No edema.  Lymphadenopathy:     Cervical: No cervical adenopathy.  Skin:    General: Skin is warm and dry.  Neurological:     General: No focal deficit present.     Mental Status: She is alert.  Psychiatric:        Mood and Affect: Mood normal.        Behavior: Behavior normal.     Lab Results  Component Value Date   WBC 4.7 10/07/2022   HGB 11.7 (A) 10/07/2022   HCT 36 10/07/2022   PLT 181 10/07/2022   GLUCOSE 79 08/02/2022   CHOL 275 (H) 10/27/2022   TRIG 249.0 (H) 10/27/2022   HDL 44.40 10/27/2022   LDLDIRECT 162.0 10/27/2022   LDLCALC 138 (H) 01/28/2021   ALT 16 10/27/2022   AST 16 10/27/2022   NA 137 10/07/2022   K 5.0 10/07/2022   CL 102 10/07/2022   CREATININE 2.1 (A) 10/07/2022   BUN 47 (A) 10/07/2022   CO2 25 (A) 10/07/2022   TSH 1.83 10/27/2022   HGBA1C 9.7 (H) 10/27/2022   MICROALBUR 15 10/07/2022    VAS [place] LOWER EXTREMITY VENOUS (DVT)  Result Date: 09/09/2022  Lower Venous DVT Study Patient Name:  Kimberly [name]  Date of Exam:   09/09/2022 Medical Rec #: [id number]          Accession #:    [id number] Date of Birth: [birth year]           Patient Gender: F Patient Age:   81 years Exam Location:  [facility] Procedure:      VAS [place] LOWER EXTREMITY VENOUS (DVT) Referring Phys: Antoine Primas --------------------------------------------------------------------------------  Indications: Pain in the right knee and swelling in the right lower leg. Patient reports having fluid drained off the right knee on 09/08/2022. She denies SOB.  Risk Factors: None identified. Comparison Study: NA Performing Technologist: Tyna Jaksch RVT  Examination Guidelines: A complete evaluation includes B-mode imaging, spectral Doppler, color Doppler, and power Doppler as needed of all accessible portions of each vessel. Bilateral testing is considered an integral part of a complete examination. Limited examinations for reoccurring indications may be performed as noted. The reflux portion of  the exam is performed with the patient in reverse Trendelenburg.  +---------+---------------+---------+-----------+----------+--------------+ RIGHT    CompressibilityPhasicitySpontaneityPropertiesThrombus Aging +---------+---------------+---------+-----------+----------+--------------+ CFV      Full           Yes      Yes                                 +---------+---------------+---------+-----------+----------+--------------+ SFJ  Full           Yes      Yes                                 +---------+---------------+---------+-----------+----------+--------------+ FV Prox  Full           Yes      Yes                                 +---------+---------------+---------+-----------+----------+--------------+ FV Mid   Full                                                        +---------+---------------+---------+-----------+----------+--------------+ FV DistalFull           Yes      Yes                                 +---------+---------------+---------+-----------+----------+--------------+ PFV      Full                    Yes                                 +---------+---------------+---------+-----------+----------+--------------+ POP      Full           Yes      Yes                                 +---------+---------------+---------+-----------+----------+--------------+ PTV      Full                                                        +---------+---------------+---------+-----------+----------+--------------+ PERO     Full                                                        +---------+---------------+---------+-----------+----------+--------------+ Gastroc  Full                                                        +---------+---------------+---------+-----------+----------+--------------+ GSV      Full           Yes      Yes                                  +---------+---------------+---------+-----------+----------+--------------+   +----+---------------+---------+-----------+----------+--------------+ LEFTCompressibilityPhasicitySpontaneityPropertiesThrombus Aging +----+---------------+---------+-----------+----------+--------------+ CFV Full           Yes  Yes                                 +----+---------------+---------+-----------+----------+--------------+     Summary: RIGHT: - No evidence of deep vein thrombosis in the lower extremity. No indirect evidence of obstruction proximal to the inguinal ligament. - No cystic structure found in the popliteal fossa.  LEFT: - No evidence of common femoral vein obstruction.  *See table(s) above for measurements and observations. Electronically signed by Nanetta Batty MD on 09/09/2022 at 7:51:38 PM.    Final     Assessment & Plan:   Anemia in stage 3b chronic kidney disease (HCC)  CKD (chronic kidney disease), stage IV (HCC)  Diabetic nephropathy associated with type 2 diabetes mellitus (HCC)  Essential hypertension- Her blood pressure is adequately well-controlled. -     TSH; Future  Hyperlipidemia LDL goal <100- Will try a low side effect statin and a PCSK9 inhibitor. -     Lipid panel; Future -     TSH; Future -     Hepatic function panel; Future -     Repatha; Inject 140 mg into the skin every 14 (fourteen) days.  Dispense: 6.3 mL; Refill: 1 -     Pitavastatin Calcium; Take 1 tablet (1 mg total) by mouth daily.  Dispense: 90 tablet; Refill: 1  Insulin-requiring or dependent type II diabetes mellitus (HCC)- Will try to get better control of her blood sugar. -     Hemoglobin A1c; Future -     HM Diabetes Foot Exam -     Lantus SoloStar; Inject 50 Units into the skin daily.  Dispense: 45 mL; Refill: 1 -     FreeStyle Libre 3 Reader; 1 Act by Does not apply route daily.  Dispense: 1 each; Refill: 3 -     FreeStyle Libre 3 Sensor; 1 Act by Does not apply route daily. Place 1  sensor on the skin every 14 days. Use to check glucose continuously  Dispense: 2 each; Refill: 5 -     Insulin Lispro; Inject 5 Units into the skin 3 (three) times daily with meals.  Dispense: 21 mL; Refill: 1 -     Amb Referral to Nutrition and Diabetic Education -     Ambulatory referral to Endocrinology  Claudication in peripheral vascular disease (HCC) -     VAS [place] ABI WITH/WO TBI; Future -     Repatha; Inject 140 mg into the skin every 14 (fourteen) days.  Dispense: 6.3 mL; Refill: 1 -     Pitavastatin Calcium; Take 1 tablet (1 mg total) by mouth daily.  Dispense: 90 tablet; Refill: 1  PAD (peripheral artery disease) (HCC) -     Repatha; Inject 140 mg into the skin every 14 (fourteen) days.  Dispense: 6.3 mL; Refill: 1 -     Pitavastatin Calcium; Take 1 tablet (1 mg total) by mouth daily.  Dispense: 90 tablet; Refill: 1  Type II diabetes mellitus with complication (HCC)  Poorly controlled type II diabetes mellitus with renal complication (HCC)  Other orders -     LDL cholesterol, direct     Follow-up: Return in about 4 months (around 02/27/2023).  Sanda Linger, MD

## 2022-10-27 NOTE — Patient Instructions (Signed)

## 2022-10-28 ENCOUNTER — Encounter: Payer: Self-pay | Admitting: Internal Medicine

## 2022-10-28 ENCOUNTER — Telehealth: Payer: Self-pay

## 2022-10-28 NOTE — Telephone Encounter (Signed)
Incoming call received 10/27/22 regarding elevated A1C. Patient called renal coordinator to inquire about nutrition option as she still receives diabetic friendly meals through [facility] [facility].   Patient suggested to contact PCP regarding PREP program referral for consult with RN Health Coach. Patient aware program is 12 weeks and thinks it would be very beneficial to have additional help. Coordinator also suggested keeping a food journal.   Email sent to [facility] nurse Viann Fish, RN as well. Referral pending for provider approval as of 10/28/22.   Baruch Gouty Renal Coordinator [facility] [phone]

## 2022-10-29 MED ORDER — PITAVASTATIN CALCIUM 1 MG PO TABS
1.0000 mg | ORAL_TABLET | Freq: Every day | ORAL | 1 refills | Status: DC
Start: 2022-10-29 — End: 2023-08-02

## 2022-10-31 ENCOUNTER — Other Ambulatory Visit: Payer: Self-pay | Admitting: Internal Medicine

## 2022-10-31 ENCOUNTER — Telehealth: Payer: Self-pay

## 2022-10-31 DIAGNOSIS — E785 Hyperlipidemia, unspecified: Secondary | ICD-10-CM

## 2022-10-31 NOTE — Progress Notes (Signed)
   10/31/2022  Patient ID: Kimberly [name], female   DOB: 22-Sep-1955, 67 y.o.   MRN: [id number]  Received request from patient's PCP, Dr. Yetta Barre, to do medication review with patient.  Contacted her to schedule a telephone visit.  She is expressing concern with addition of Humalog insulin injections, not wanting to increase her daily number of injections.  Telephone visit scheduled for Thursday morning at 10:30 to discuss other options.  Lenna Gilford, PharmD, DPLA

## 2022-11-02 ENCOUNTER — Other Ambulatory Visit: Payer: Self-pay | Admitting: Internal Medicine

## 2022-11-02 ENCOUNTER — Ambulatory Visit (HOSPITAL_COMMUNITY)
Admission: RE | Admit: 2022-11-02 | Discharge: 2022-11-02 | Disposition: A | Discharge status: Home / Self Care | Payer: Medicare Other | Source: Ambulatory Visit | Attending: Internal Medicine | Admitting: Internal Medicine

## 2022-11-02 DIAGNOSIS — I739 Peripheral vascular disease, unspecified: Secondary | ICD-10-CM

## 2022-11-02 LAB — VAS US ABI WITH/WO TBI
Left ABI: 0.73
Right ABI: 0.85

## 2022-11-02 NOTE — Telephone Encounter (Signed)
I will forward you an email on entering the referral.

## 2022-11-03 ENCOUNTER — Other Ambulatory Visit: Payer: Medicare Other

## 2022-11-03 ENCOUNTER — Telehealth: Payer: Self-pay

## 2022-11-03 NOTE — Progress Notes (Signed)
   11/03/2022  Patient ID: Kimberly [name], female   DOB: 12-02-1955, 67 y.o.   MRN: [id number]  Telephone visit scheduled with patient for 1030am 11/03/22 in response to request from PCP, Dr. Yetta Barre, to complete medication review.  When appointment was scheduled, patient stated she was recently prescribed TID Humalog and was not going to take.  Outreach made for telephone visit to complete medication review and discuss alternatives to meal-time insulin.  Patient stated she had tried to cancel our appointment and did not wish to discuss medications.  She has scheduled an upcoming appointment with endocrinology.  Lenna Gilford, PharmD, DPLA

## 2022-11-05 ENCOUNTER — Other Ambulatory Visit: Payer: Self-pay | Admitting: Internal Medicine

## 2022-11-05 DIAGNOSIS — E119 Type 2 diabetes mellitus without complications: Secondary | ICD-10-CM

## 2022-11-07 ENCOUNTER — Ambulatory Visit: Payer: Medicare Other | Admitting: Surgery

## 2022-11-07 ENCOUNTER — Encounter: Payer: Self-pay | Admitting: Surgery

## 2022-11-07 VITALS — BP 126/72 | HR 70 | Temp 98.1°F | Resp 20 | Ht 71.0 in | Wt 185.0 lb

## 2022-11-07 DIAGNOSIS — I70213 Atherosclerosis of native arteries of extremities with intermittent claudication, bilateral legs: Secondary | ICD-10-CM

## 2022-11-07 NOTE — Progress Notes (Signed)
[facility]  Patient name: Kimberly [name] MRN: [id number] DOB: [birth year] Sex: female   REQUESTING PROVIDER:    Dr. Yetta Barre   REASON FOR CONSULT:    PAD  HISTORY OF PRESENT ILLNESS:   Kimberly [name] is a 67 y.o. female, who is referred for evaluation of lower extremity atherosclerotic vascular disease.  The patient states that Dr. Yetta Barre was unable to palpate pedal pulses and so he sent her for a ultrasound I then referred her to to me.  She states that she will get cramping in her buttock and thighs as well as her calf after walking approximately 1 mile.  She does not have any open wounds.  She does not have any ulcers.  Her symptoms are tolerable and do not impact her daily life.  The patient does suffer from diabetes.  She is medically managed for hypertension.  She has chronic renal insufficiency.  She is contemplating starting Repatha.  She is a non-smoker.  PAST MEDICAL HISTORY    Past Medical History:  Diagnosis Date   Arthritis    Carotid bruit 03/21/2022   Cataract    OD   Chronic kidney disease    Diabetes mellitus 2001   Hyperlipidemia Dx 2012   Lower extremity edema 01/06/2021     FAMILY HISTORY   Family History  Problem Relation Age of Onset   Depression Mother    Hypertension Mother    Diabetes Mother    Heart disease Father    Heart attack Father 71   Uterine cancer Sister    Breast cancer Sister    Lung cancer Brother    Prostate cancer Neg Hx    Endometrial cancer Neg Hx    Ovarian cancer Neg Hx    Colon cancer Neg Hx    Pancreatic cancer Neg Hx     SOCIAL HISTORY:   Social History   Socioeconomic History   Marital status: Divorced    Spouse name: Not on file   Number of children: Not on file   Years of education: Not on file   Highest education level: Bachelor's degree (e.g., BA, AB, BS)  Occupational History   Occupation: retired  Tobacco Use   Smoking status:  Never   Smokeless tobacco: Never  Vaping Use   Vaping Use: Never used  Substance and Sexual Activity   Alcohol use: Yes    Alcohol/week: 4.0 standard drinks of alcohol    Types: 4 Glasses of wine per week    Comment: occ   Drug use: No   Sexual activity: Not Currently    Partners: Female  Other Topics Concern   Not on file  Social History Narrative   Not on file   Social Determinants of Health   Financial Resource Strain: High Risk (10/25/2022)   Overall Financial Resource Strain (CARDIA)    Difficulty of Paying Living Expenses: Hard  Food Insecurity: Food Insecurity Present (10/25/2022)   Hunger Vital Sign    Worried About Running Out of Food in the Last Year: Sometimes true    Ran Out of Food in the Last Year: Sometimes true  Transportation Needs: No Transportation Needs (10/25/2022)   PRAPARE - Administrator, Civil Service (Medical): No    Lack of Transportation (Non-Medical): No  Physical Activity: Insufficiently Active (10/25/2022)   Exercise Vital Sign    Days of Exercise per Week: 3 days    Minutes of Exercise per Session: 30 min  Stress: Stress Concern Present (10/25/2022)   [employer] of Occupational Health - Occupational Stress Questionnaire    Feeling of Stress : To some extent  Social Connections: Moderately Integrated (10/25/2022)   Social Connection and Isolation Panel [NHANES]    Frequency of Communication with Friends and Family: More than three times a week    Frequency of Social Gatherings with Friends and Family: Twice a week    Attends Religious Services: More than 4 times per year    Active Member of [employer] or Organizations: Yes    Attends Engineer, structural: More than 4 times per year    Marital Status: Divorced  Intimate Partner Violence: Not At Risk (11/09/2021)   Humiliation, Afraid, Rape, and Kick questionnaire    Fear of Current or Ex-Partner: No    Emotionally Abused: No    Physically Abused: No    Sexually Abused: No     ALLERGIES:    Allergies  Allergen Reactions   Semaglutide(0.25 Or 0.5mg -Dos) Nausea Only   Ace Inhibitors Cough   Rosuvastatin     Muscle cramps    CURRENT MEDICATIONS:    Current Outpatient Medications  Medication Sig Dispense Refill   albuterol (VENTOLIN HFA) 108 (90 Base) MCG/ACT inhaler Inhale 2 puffs into the lungs every 4 (four) hours as needed. ADDITIONAL REFILLS FROM PCP 1 each 0   amLODipine (NORVASC) 10 MG tablet TAKE 1 TABLET BY MOUTH DAILY 90 tablet 0   aspirin EC 81 MG tablet Take 81 mg by mouth daily. Swallow whole.     Blood Glucose Monitoring Suppl (CONTOUR NEXT MONITOR) w/Device KIT Use to test blood sugars 4 times daily. Dx: e11.9 1 kit 1   cetirizine (ZYRTEC) 10 MG tablet Take 1 tablet (10 mg total) by mouth daily. 30 tablet 0   chlorthalidone (HYGROTON) 25 MG tablet TAKE ONE TABLET BY MOUTH DAILY 90 tablet 1   colchicine 0.6 MG tablet Take 1 tablet (0.6 mg total) by mouth daily. 90 tablet 0   Continuous Glucose Receiver (FREESTYLE LIBRE 3 READER) DEVI 1 Act by Does not apply route daily. 1 each 3   Continuous Glucose Sensor (FREESTYLE LIBRE 2 SENSOR) MISC TEST BLOOD SUGAR THREE TIMES A DAY AS NEEDED AND CHANGE SENSOR EVERY 2 WEEKS 2 each 5   Evolocumab (REPATHA) 140 MG/ML SOSY Inject 140 mg into the skin every 14 (fourteen) days. 6.3 mL 1   Finerenone (KERENDIA) 20 MG TABS Take 1 tablet by mouth daily. 90 tablet 1   fluticasone (FLONASE) 50 MCG/ACT nasal spray Place 1 spray into both nostrils daily. 16 g 0   glucose blood (CONTOUR NEXT TEST) test strip Use to test blood sugars 4 times daily. Dx:e11.9 400 each 12   insulin glargine (LANTUS SOLOSTAR) 100 UNIT/ML Solostar Pen Inject 50 Units into the skin daily. 45 mL 1   insulin lispro (HUMALOG) 200 UNIT/ML KwikPen Inject 5 Units into the skin 3 (three) times daily with meals. 21 mL 1   Insulin Pen Needle (BD PEN NEEDLE NANO 2ND GEN) 32G X 4 MM MISC USE AS DIRECTED ONCE DAILY 100 each 1   Lancets 30G MISC Use  to check blood sugars 4 times daily. Dx:e11.9. please let us know if not covered 400 each 12   losartan (COZAAR) 50 MG tablet TAKE ONE TABLET BY MOUTH DAILY 90 tablet 3   Pitavastatin Calcium 1 MG TABS Take 1 tablet (1 mg total) by mouth daily. 90 tablet 1   zinc gluconate 50 MG  tablet TAKE 1 TABLET  BY MOUTH DAILY 90 tablet 1   No current facility-administered medications for this visit.    REVIEW OF SYSTEMS:   [X]  denotes positive finding, [ ]  denotes negative finding Cardiac  Comments:  Chest pain or chest pressure:    Shortness of breath upon exertion:    Short of breath when lying flat:    Irregular heart rhythm:        Vascular    Pain in calf, thigh, or hip brought on by ambulation: x   Pain in feet at night that wakes you up from your sleep:     Blood clot in your veins:    Leg swelling:         Pulmonary    Oxygen at home:    Productive cough:     Wheezing:         Neurologic    Sudden weakness in arms or legs:     Sudden numbness in arms or legs:     Sudden onset of difficulty speaking or slurred speech:    Temporary loss of vision in one eye:     Problems with dizziness:         Gastrointestinal    Blood in stool:      Vomited blood:         Genitourinary    Burning when urinating:     Blood in urine:        Psychiatric    Major depression:         Hematologic    Bleeding problems:    Problems with blood clotting too easily:        Skin    Rashes or ulcers:        Constitutional    Fever or chills:     PHYSICAL EXAM:   Vitals:   11/07/22 0936  BP: 126/72  Pulse: 70  Resp: 20  Temp: 98.1 F (36.7 C)  SpO2: 97%  Weight: 185 lb (83.9 kg)  Height: 5\' 11"  (1.803 m)    GENERAL: The patient is a well-nourished female, in no acute distress. The vital signs are documented above. CARDIAC: There is a regular rate and rhythm.  VASCULAR: palpable femoral pulses nonpalpable pedal pulses.  No carotid bruits PULMONARY: Nonlabored  respirations ABDOMEN: Soft and non-tender with normal pitched bowel sounds.  MUSCULOSKELETAL: There are no major deformities or cyanosis. NEUROLOGIC: No focal weakness or paresthesias are detected. SKIN: There are no ulcers or rashes noted. PSYCHIATRIC: The patient has a normal affect.  STUDIES:   I have reviewed the following:  ABI/TBIToday's ABIToday's TBIPrevious ABIPrevious TBI  +-------+-----------+-----------+------------+------------+  Right 0.85       0.63                                 +-------+-----------+-----------+------------+------------+  Left  0.73       0.5                                  +-------+-----------+-----------+------------+------------+   Right toe pressure: 90 Left toe pressure: 71 ASSESSMENT and PLAN   PAD: The patient has mild vascular insufficiency by ABIs.  Her symptoms are well-tolerated.  She does not have any open wounds.  I stressed the importance of medical management at this time.  We talked about a exercise program.  I encouraged  her to consider starting Repatha for cholesterol management.  I stressed the importance of blood pressure control and glucose control for her diabetes.  She knows to contact me should she develop a nonhealing wound.  Otherwise, I will see her back in 1 year with repeat ABIs   Charlena Cross, MD, FACS [facility] [phone] Pager [phone]

## 2022-11-08 ENCOUNTER — Ambulatory Visit: Payer: Medicare Other | Admitting: Family Medicine

## 2022-11-10 ENCOUNTER — Telehealth: Payer: Self-pay

## 2022-11-10 NOTE — Telephone Encounter (Signed)
REPATHA APPROVAL [id number]

## 2022-11-10 NOTE — Telephone Encounter (Signed)
PA initiated via Covermymeds; KEY: [id number]. Awaiting determination.

## 2022-11-11 ENCOUNTER — Other Ambulatory Visit: Payer: Self-pay | Admitting: Internal Medicine

## 2022-11-11 ENCOUNTER — Other Ambulatory Visit (HOSPITAL_COMMUNITY): Payer: Self-pay

## 2022-11-11 ENCOUNTER — Ambulatory Visit: Payer: Medicare Other

## 2022-11-11 DIAGNOSIS — E119 Type 2 diabetes mellitus without complications: Secondary | ICD-10-CM

## 2022-11-11 NOTE — Telephone Encounter (Signed)
Pharmacy aware

## 2022-11-14 ENCOUNTER — Ambulatory Visit (INDEPENDENT_AMBULATORY_CARE_PROVIDER_SITE_OTHER): Payer: Medicare Other

## 2022-11-14 VITALS — Ht 71.0 in | Wt 185.0 lb

## 2022-11-14 DIAGNOSIS — Z0000 Encounter for general adult medical examination without abnormal findings: Secondary | ICD-10-CM | POA: Diagnosis not present

## 2022-11-14 NOTE — Progress Notes (Signed)
I connected with  Kimberly [name] on 11/14/22 by a audio enabled telemedicine application and verified that I am speaking with the correct person using two identifiers.  Patient Location: Home  Provider Location: Office/Clinic  I discussed the limitations of evaluation and management by telemedicine. The patient expressed understanding and agreed to proceed.  Patient Medicare AWV questionnaire was completed by the patient on 11/10/2022; I have confirmed that all information answered by patient is correct and no changes since this date.    Subjective:   Kimberly [name] is a 67 y.o. female who presents for Medicare Annual (Subsequent) preventive examination.  Review of Systems     Cardiac Risk Factors include: advanced age (>17men, >60 women);diabetes mellitus;dyslipidemia;family history of premature cardiovascular disease;hypertension     Objective:    Today's Vitals   11/14/22 1034 11/14/22 1035  Weight: 185 lb (83.9 kg)   Height: 5\' 11"  (1.803 m)   PainSc: 0-No pain 0-No pain   Body mass index is 25.8 kg/m.     11/14/2022   10:36 AM 11/09/2021    9:52 AM 08/26/2021   11:40 AM 07/09/2018    2:07 PM 05/06/2016   10:29 AM 01/06/2015    4:59 PM 10/17/2014    5:02 PM  Advanced Directives  Does Patient Have a Medical Advance Directive? Yes No No No No No No  Type of Estate agent of [place];Living will        Copy of Healthcare Power of Attorney in Chart? No - copy requested        Would patient like information on creating a medical advance directive?  No - Patient declined No - Patient declined Yes (MAU/Ambulatory/Procedural Areas - Information given) No - patient declined information  No - patient declined information    Current Medications (verified) Outpatient Encounter Medications as of 11/14/2022  Medication Sig   albuterol (VENTOLIN HFA) 108 (90 Base) MCG/ACT inhaler Inhale 2 puffs into the lungs every 4 (four) hours as needed. ADDITIONAL REFILLS  FROM PCP   amLODipine (NORVASC) 10 MG tablet TAKE 1 TABLET BY MOUTH DAILY   aspirin EC 81 MG tablet Take 81 mg by mouth daily. Swallow whole.   Blood Glucose Monitoring Suppl (CONTOUR NEXT MONITOR) w/Device KIT Use to test blood sugars 4 times daily. Dx: e11.9   cetirizine (ZYRTEC) 10 MG tablet Take 1 tablet (10 mg total) by mouth daily.   chlorthalidone (HYGROTON) 25 MG tablet TAKE ONE TABLET BY MOUTH DAILY   colchicine 0.6 MG tablet Take 1 tablet (0.6 mg total) by mouth daily.   Continuous Glucose Receiver (FREESTYLE LIBRE 2 READER) DEVI READER DEVICE TO USE WITH SENSORS   Continuous Glucose Sensor (FREESTYLE LIBRE 2 SENSOR) MISC TEST BLOOD SUGAR THREE TIMES A DAY AS NEEDED AND CHANGE SENSOR EVERY 2 WEEKS   Evolocumab (REPATHA) 140 MG/ML SOSY Inject 140 mg into the skin every 14 (fourteen) days.   Finerenone (KERENDIA) 20 MG TABS Take 1 tablet by mouth daily.   fluticasone (FLONASE) 50 MCG/ACT nasal spray Place 1 spray into both nostrils daily.   glucose blood (CONTOUR NEXT TEST) test strip Use to test blood sugars 4 times daily. Dx:e11.9   insulin glargine (LANTUS SOLOSTAR) 100 UNIT/ML Solostar Pen Inject 50 Units into the skin daily.   insulin lispro (HUMALOG) 200 UNIT/ML KwikPen Inject 5 Units into the skin 3 (three) times daily with meals.   Insulin Pen Needle (BD PEN NEEDLE NANO 2ND GEN) 32G X 4 MM MISC USE AS  DIRECTED ONCE DAILY   Lancets 30G MISC Use to check blood sugars 4 times daily. Dx:e11.9. please let us know if not covered   losartan (COZAAR) 50 MG tablet TAKE ONE TABLET BY MOUTH DAILY   Pitavastatin Calcium 1 MG TABS Take 1 tablet (1 mg total) by mouth daily.   zinc gluconate 50 MG tablet TAKE 1 TABLET  BY MOUTH DAILY   No facility-administered encounter medications on file as of 11/14/2022.    Allergies (verified) Semaglutide(0.25 or 0.5mg -dos), Ace inhibitors, and Rosuvastatin   History: Past Medical History:  Diagnosis Date   Arthritis    Carotid bruit 03/21/2022    Cataract    OD   Chronic kidney disease    Diabetes mellitus 2001   Hyperlipidemia Dx 2012   Lower extremity edema 01/06/2021   Past Surgical History:  Procedure Laterality Date   CATARACT EXTRACTION Left    EYE SURGERY Left    Cat Sx   VAGINAL HYSTERECTOMY     AUB (surgery in [place]), prior to 2000   Family History  Problem Relation Age of Onset   Depression Mother    Hypertension Mother    Diabetes Mother    Heart disease Father    Heart attack Father 19   Uterine cancer Sister    Breast cancer Sister    Lung cancer Brother    Prostate cancer Neg Hx    Endometrial cancer Neg Hx    Ovarian cancer Neg Hx    Colon cancer Neg Hx    Pancreatic cancer Neg Hx    Social History   Socioeconomic History   Marital status: Divorced    Spouse name: Not on file   Number of children: Not on file   Years of education: Not on file   Highest education level: Bachelor's degree (e.g., BA, AB, BS)  Occupational History   Occupation: retired  Tobacco Use   Smoking status: Never   Smokeless tobacco: Never  Vaping Use   Vaping Use: Never used  Substance and Sexual Activity   Alcohol use: Yes    Alcohol/week: 4.0 standard drinks of alcohol    Types: 4 Glasses of wine per week    Comment: occ   Drug use: No   Sexual activity: Not Currently    Partners: Female  Other Topics Concern   Not on file  Social History Narrative   Not on file   Social Determinants of Health   Financial Resource Strain: High Risk (10/25/2022)   Overall Financial Resource Strain (CARDIA)    Difficulty of Paying Living Expenses: Hard  Food Insecurity: Food Insecurity Present (10/25/2022)   Hunger Vital Sign    Worried About Running Out of Food in the Last Year: Sometimes true    Ran Out of Food in the Last Year: Sometimes true  Transportation Needs: No Transportation Needs (10/25/2022)   PRAPARE - Administrator, Civil Service (Medical): No    Lack of Transportation (Non-Medical): No   Physical Activity: Insufficiently Active (10/25/2022)   Exercise Vital Sign    Days of Exercise per Week: 3 days    Minutes of Exercise per Session: 30 min  Stress: Stress Concern Present (10/25/2022)   [employer] of Occupational Health - Occupational Stress Questionnaire    Feeling of Stress : To some extent  Social Connections: Moderately Integrated (10/25/2022)   Social Connection and Isolation Panel [NHANES]    Frequency of Communication with Friends and Family: More than three times a week  Frequency of Social Gatherings with Friends and Family: Twice a week    Attends Religious Services: More than 4 times per year    Active Member of [employer] or Organizations: Yes    Attends Engineer, structural: More than 4 times per year    Marital Status: Divorced    Tobacco Counseling Counseling given: Not Answered   Clinical Intake:  Pre-visit preparation completed: No  Pain : No/denies pain Pain Score: 0-No pain     BMI - recorded: 25.8 Nutritional Status: BMI 25 -29 Overweight Nutritional Risks: None Diabetes: Yes CBG done?: No (60 fasting) Did pt. bring in CBG monitor from home?: No  How often do you need to have someone help you when you read instructions, pamphlets, or other written materials from your doctor or pharmacy?: 1 - Never What is the last grade level you completed in school?: HSG  Nutrition Risk Assessment:  Has the patient had any N/V/D within the last 2 months?  No  Does the patient have any non-healing wounds?  No  Has the patient had any unintentional weight loss or weight gain?  No   Diabetes:  Is the patient diabetic?  Yes  If diabetic, was a CBG obtained today?  Yes ; 60 fasting Did the patient bring in their glucometer from home?  No  How often do you monitor your CBG's? 4 times daily.   Financial Strains and Diabetes Management:  Are you having any financial strains with the device, your supplies or your medication? No .  Does  the patient want to be seen by Chronic Care Management for management of their diabetes?  No  Would the patient like to be referred to a Nutritionist or for Diabetic Management?  No   Diabetic Exams:  Diabetic Eye Exam: Completed 11/25/2021 Diabetic Foot Exam: Completed 10/27/2022   Interpreter Needed?: No  Information entered by :: Susie Cassette, LPN.   Activities of Daily Living    11/14/2022   10:50 AM 11/10/2022    1:17 PM  In your present state of health, do you have any difficulty performing the following activities:  Hearing? 0 0  Vision? 0 0  Difficulty concentrating or making decisions? 0 0  Walking or climbing stairs? 0 0  Dressing or bathing? 0 0  Doing errands, shopping? 0 0  Preparing Food and eating ? N N  Using the Toilet? N   In the past six months, have you accidently leaked urine? Y Y  Do you have problems with loss of bowel control? N N  Managing your Medications? N N  Managing your Finances? N N  Housekeeping or managing your Housekeeping? N N    Patient Care Team: Etta Grandchild, MD as PCP - General (Internal Medicine) Pa, [facility] as Consulting Physician (Ophthalmology)  Indicate any recent Medical Services you may have received from other than [facility] providers in the past year (date may be approximate).     Assessment:   This is a routine wellness examination for Tryphena.  Hearing/Vision screen Hearing Screening - Comments:: Denies hearing difficulties   Vision Screening - Comments:: Wears rx glasses; left eye cataract removed - up to date with routine eye exams with [facility]    Dietary issues and exercise activities discussed: Current Exercise Habits: Structured exercise class Engineer, materials), Type of exercise: walking;treadmill;stretching;strength training/weights (walk in [place]), Time (Minutes): 60, Frequency (Times/Week): 3, Weekly Exercise (Minutes/Week): 180, Intensity: Moderate, Exercise limited by: orthopedic  condition(s)   Goals  Addressed             This Visit's Progress    My goal for 2024 is to be in less pain and eat more healthier.        Depression Screen    11/14/2022   10:41 AM 08/02/2022    4:01 PM 11/09/2021    9:56 AM 07/27/2021    2:30 PM 07/22/2021    1:04 PM 02/17/2020    9:11 AM 02/04/2019    9:39 AM  PHQ 2/9 Scores  PHQ - 2 Score 0 0 0 1 0 0 0  PHQ- 9 Score 0          Fall Risk    11/14/2022   10:39 AM 11/10/2022    1:17 PM 08/02/2022    4:00 PM 11/09/2021    9:56 AM 07/27/2021    2:30 PM  Fall Risk   Falls in the past year? 0 0 0 0 0  Number falls in past yr: 0  0 0 0  Injury with Fall? 0  0 0 0  Comment     N/A- no falls reported  Risk for fall due to : No Fall Risks  No Fall Risks No Fall Risks Medication side effect  Follow up Falls prevention discussed  Falls evaluation completed Falls evaluation completed Falls prevention discussed  Comment     does not currently use cane/ walker or other assisitve devices    FALL RISK PREVENTION PERTAINING TO THE HOME:  Any stairs in or around the home? Yes  If so, are there any without handrails? No  Home free of loose throw rugs in walkways, pet beds, electrical cords, etc? Yes  Adequate lighting in your home to reduce risk of falls? Yes   ASSISTIVE DEVICES UTILIZED TO PREVENT FALLS:  Life alert? No  Use of a cane, walker or w/c? No  Grab bars in the bathroom? No  Shower chair or bench in shower? No  Elevated toilet seat or a handicapped toilet? No   TIMED UP AND GO:  Was the test performed? No . Telephonic Visit   Cognitive Function:        11/14/2022   10:52 AM 11/09/2021   10:05 AM  6CIT Screen  What Year? 0 points 0 points  What month? 0 points 0 points  What time? 0 points 0 points  Count back from 20 0 points 0 points  Months in reverse 0 points 0 points  Repeat phrase 0 points 0 points  Total Score 0 points 0 points    Immunizations Immunization History  Administered Date(s) Administered    Fluad Quad(high Dose 65+) 03/16/2019   Influenza Split 05/14/2012   Influenza,inj,Quad PF,6+ Mos 07/27/2013, 03/12/2014, 04/19/2016, 03/07/2017, 03/09/2018   Influenza-Unspecified 03/18/2021, 04/13/2021   PFIZER(Purple Top)SARS-COV-2 Vaccination 08/19/2019, 09/09/2019, 05/04/2020   Pneumococcal Conjugate-13 02/04/2019   Pneumococcal Polysaccharide-23 04/30/2014, 07/22/2021   Pneumococcal-Unspecified 07/22/2021   Tdap 04/30/2014   Unspecified SARS-COV-2 Vaccination 03/11/2020   Zoster Recombinat (Shingrix) 06/08/2022    TDAP status: Up to date  Flu Vaccine status: Due, Education has been provided regarding the importance of this vaccine. Advised may receive this vaccine at local pharmacy or Health Dept. Aware to provide a copy of the vaccination record if obtained from local pharmacy or Health Dept. Verbalized acceptance and understanding.  Pneumococcal vaccine status: Up to date  Covid-19 vaccine status: Completed vaccines  Qualifies for Shingles Vaccine? Yes   Zostavax completed No   Shingrix Completed?: No.  Education has been provided regarding the importance of this vaccine. Patient has been advised to call insurance company to determine out of pocket expense if they have not yet received this vaccine. Advised may also receive vaccine at local pharmacy or Health Dept. Verbalized acceptance and understanding.  Screening Tests Health Maintenance  Topic Date Due   Zoster Vaccines- Shingrix (2 of 2) 08/03/2022   OPHTHALMOLOGY EXAM  11/26/2022   INFLUENZA VACCINE  01/26/2023   MAMMOGRAM  04/17/2023   HEMOGLOBIN A1C  04/29/2023   Diabetic kidney evaluation - eGFR measurement  10/07/2023   Diabetic kidney evaluation - Urine ACR  10/07/2023   FOOT EXAM  10/27/2023   Medicare Annual Wellness (AWV)  11/14/2023   DTaP/Tdap/Td (2 - Td or Tdap) 04/30/2024   COLONOSCOPY (Pts 45-6yrs Insurance coverage will need to be confirmed)  07/18/2026   Pneumonia Vaccine 5+ Years old   Completed   DEXA SCAN  Completed   Hepatitis C Screening  Completed   HPV VACCINES  Aged Out   COVID-19 Vaccine  Discontinued    Health Maintenance  Health Maintenance Due  Topic Date Due   Zoster Vaccines- Shingrix (2 of 2) 08/03/2022    Colorectal cancer screening: Type of screening: Colonoscopy. Completed 07/18/2016. Repeat every 10 years  Mammogram status: Completed 04/16/2021. Repeat every year; Patient goes every 2 years.  Bone Density status: Completed 04/28/2020. Results reflect: Bone density results: NORMAL. Repeat every 5 years.  Lung Cancer Screening: (Low Dose CT Chest recommended if Age 30-80 years, 30 pack-year currently smoking OR have quit w/in 15years.) does not qualify.   Lung Cancer Screening Referral: no  Additional Screening:  Hepatitis C Screening: does qualify; Completed 06/25/2018  Vision Screening: Recommended annual ophthalmology exams for early detection of glaucoma and other disorders of the eye. Is the patient up to date with their annual eye exam?  Yes  Who is the provider or what is the name of the office in which the patient attends annual eye exams? Mateo Flow, MD. If pt is not established with a provider, would they like to be referred to a provider to establish care? No .   Dental Screening: Recommended annual dental exams for proper oral hygiene  Community Resource Referral / Chronic Care Management: CRR required this visit?  No   CCM required this visit?  No      Plan:     I have personally reviewed and noted the following in the patient's chart:   Medical and social history Use of alcohol, tobacco or illicit drugs  Current medications and supplements including opioid prescriptions. Patient is not currently taking opioid prescriptions. Functional ability and status Nutritional status Physical activity Advanced directives List of other physicians Hospitalizations, surgeries, and ER visits in previous 12  months Vitals Screenings to include cognitive, depression, and falls Referrals and appointments  In addition, I have reviewed and discussed with patient certain preventive protocols, quality metrics, and best practice recommendations. A written personalized care plan for preventive services as well as general preventive health recommendations were provided to patient.     Mickeal Needy, LPN   [id number]   Nurse Notes:  Normal cognitive status assessed by direct observation via telephone conversation by this Nurse Health Advisor. No abnormalities found.

## 2022-11-14 NOTE — Patient Instructions (Addendum)
Kimberly [name] , Thank you for taking time to come for your Medicare Wellness Visit. I appreciate your ongoing commitment to your health goals. Please review the following plan we discussed and let me know if I can assist you in the future.   These are the goals we discussed:  Goals      My goal for 2024 is to be in less pain and eat more healthier.        This is a list of the screening recommended for you and due dates:  Health Maintenance  Topic Date Due   Zoster (Shingles) Vaccine (2 of 2) 08/03/2022   Eye exam for diabetics  11/26/2022   Flu Shot  01/26/2023   Mammogram  04/17/2023   Hemoglobin A1C  04/29/2023   Yearly kidney function blood test for diabetes  10/07/2023   Yearly kidney health urinalysis for diabetes  10/07/2023   Complete foot exam   10/27/2023   Medicare Annual Wellness Visit  11/14/2023   DTaP/Tdap/Td vaccine (2 - Td or Tdap) 04/30/2024   Colon Cancer Screening  07/18/2026   Pneumonia Vaccine  Completed   DEXA scan (bone density measurement)  Completed   Hepatitis C Screening: USPSTF Recommendation to screen - Ages 61-79 yo.  Completed   HPV Vaccine  Aged Out   COVID-19 Vaccine  Discontinued    Advanced directives: Yes; documents are on file with [place].  Conditions/risks identified: Yes; Type II Diabetes  Next appointment: Follow up in one year for your annual wellness visit.   Preventive Care 62 Years and Older, Female Preventive care refers to lifestyle choices and visits with your health care provider that can promote health and wellness. What does preventive care include? A yearly physical exam. This is also called an annual well check. Dental exams once or twice a year. Routine eye exams. Ask your health care provider how often you should have your eyes checked. Personal lifestyle choices, including: Daily care of your teeth and gums. Regular physical activity. Eating a healthy diet. Avoiding tobacco and drug use. Limiting alcohol  use. Practicing safe sex. Taking low-dose aspirin every day. Taking vitamin and mineral supplements as recommended by your health care provider. What happens during an annual well check? The services and screenings done by your health care provider during your annual well check will depend on your age, overall health, lifestyle risk factors, and family history of disease. Counseling  Your health care provider may ask you questions about your: Alcohol use. Tobacco use. Drug use. Emotional well-being. Home and relationship well-being. Sexual activity. Eating habits. History of falls. Memory and ability to understand (cognition). Work and work Astronomer. Reproductive health. Screening  You may have the following tests or measurements: Height, weight, and BMI. Blood pressure. Lipid and cholesterol levels. These may be checked every 5 years, or more frequently if you are over 80 years old. Skin check. Lung cancer screening. You may have this screening every year starting at age 99 if you have a 30-pack-year history of smoking and currently smoke or have quit within the past 15 years. Fecal occult blood test (FOBT) of the stool. You may have this test every year starting at age 64. Flexible sigmoidoscopy or colonoscopy. You may have a sigmoidoscopy every 5 years or a colonoscopy every 10 years starting at age 77. Hepatitis C blood test. Hepatitis B blood test. Sexually transmitted disease (STD) testing. Diabetes screening. This is done by checking your blood sugar (glucose) after you have not eaten  for a while (fasting). You may have this done every 1-3 years. Bone density scan. This is done to screen for osteoporosis. You may have this done starting at age 11. Mammogram. This may be done every 1-2 years. Talk to your health care provider about how often you should have regular mammograms. Talk with your health care provider about your test results, treatment options, and if necessary,  the need for more tests. Vaccines  Your health care provider may recommend certain vaccines, such as: Influenza vaccine. This is recommended every year. Tetanus, diphtheria, and acellular pertussis (Tdap, Td) vaccine. You may need a Td booster every 10 years. Zoster vaccine. You may need this after age 63. Pneumococcal 13-valent conjugate (PCV13) vaccine. One dose is recommended after age 44. Pneumococcal polysaccharide (PPSV23) vaccine. One dose is recommended after age 18. Talk to your health care provider about which screenings and vaccines you need and how often you need them. This information is not intended to replace advice given to you by your health care provider. Make sure you discuss any questions you have with your health care provider. Document Released: 07/10/2015 Document Revised: 03/02/2016 Document Reviewed: 04/14/2015 Elsevier Interactive Patient Education  2017 [employer].  Fall Prevention in the Home Falls can cause injuries. They can happen to people of all ages. There are many things you can do to make your home safe and to help prevent falls. What can I do on the outside of my home? Regularly fix the edges of walkways and driveways and fix any cracks. Remove anything that might make you trip as you walk through a door, such as a raised step or threshold. Trim any bushes or trees on the path to your home. Use bright outdoor lighting. Clear any walking paths of anything that might make someone trip, such as rocks or tools. Regularly check to see if handrails are loose or broken. Make sure that both sides of any steps have handrails. Any raised decks and porches should have guardrails on the edges. Have any leaves, snow, or ice cleared regularly. Use sand or salt on walking paths during winter. Clean up any spills in your garage right away. This includes oil or grease spills. What can I do in the bathroom? Use night lights. Install grab bars by the toilet and in the  tub and shower. Do not use towel bars as grab bars. Use non-skid mats or decals in the tub or shower. If you need to sit down in the shower, use a plastic, non-slip stool. Keep the floor dry. Clean up any water that spills on the floor as soon as it happens. Remove soap buildup in the tub or shower regularly. Attach bath mats securely with double-sided non-slip rug tape. Do not have throw rugs and other things on the floor that can make you trip. What can I do in the bedroom? Use night lights. Make sure that you have a light by your bed that is easy to reach. Do not use any sheets or blankets that are too big for your bed. They should not hang down onto the floor. Have a firm chair that has side arms. You can use this for support while you get dressed. Do not have throw rugs and other things on the floor that can make you trip. What can I do in the kitchen? Clean up any spills right away. Avoid walking on wet floors. Keep items that you use a lot in easy-to-reach places. If you need to reach something  above you, use a strong step stool that has a grab bar. Keep electrical cords out of the way. Do not use floor polish or wax that makes floors slippery. If you must use wax, use non-skid floor wax. Do not have throw rugs and other things on the floor that can make you trip. What can I do with my stairs? Do not leave any items on the stairs. Make sure that there are handrails on both sides of the stairs and use them. Fix handrails that are broken or loose. Make sure that handrails are as long as the stairways. Check any carpeting to make sure that it is firmly attached to the stairs. Fix any carpet that is loose or worn. Avoid having throw rugs at the top or bottom of the stairs. If you do have throw rugs, attach them to the floor with carpet tape. Make sure that you have a light switch at the top of the stairs and the bottom of the stairs. If you do not have them, ask someone to add them for  you. What else can I do to help prevent falls? Wear shoes that: Do not have high heels. Have rubber bottoms. Are comfortable and fit you well. Are closed at the toe. Do not wear sandals. If you use a stepladder: Make sure that it is fully opened. Do not climb a closed stepladder. Make sure that both sides of the stepladder are locked into place. Ask someone to hold it for you, if possible. Clearly mark and make sure that you can see: Any grab bars or handrails. First and last steps. Where the edge of each step is. Use tools that help you move around (mobility aids) if they are needed. These include: Canes. Walkers. Scooters. Crutches. Turn on the lights when you go into a dark area. Replace any light bulbs as soon as they burn out. Set up your furniture so you have a clear path. Avoid moving your furniture around. If any of your floors are uneven, fix them. If there are any pets around you, be aware of where they are. Review your medicines with your doctor. Some medicines can make you feel dizzy. This can increase your chance of falling. Ask your doctor what other things that you can do to help prevent falls. This information is not intended to replace advice given to you by your health care provider. Make sure you discuss any questions you have with your health care provider. Document Released: 04/09/2009 Document Revised: 11/19/2015 Document Reviewed: 07/18/2014 Elsevier Interactive Patient Education  2017 [employer].

## 2022-11-16 ENCOUNTER — Telehealth: Payer: Self-pay | Admitting: Internal Medicine

## 2022-11-16 ENCOUNTER — Other Ambulatory Visit: Payer: Self-pay

## 2022-11-16 DIAGNOSIS — I70213 Atherosclerosis of native arteries of extremities with intermittent claudication, bilateral legs: Secondary | ICD-10-CM

## 2022-11-16 NOTE — Telephone Encounter (Signed)
Patient called and wanted to remind Dr. Yetta Barre about his donation/contribution. She said he would know what it is. Best callback is [phone].

## 2022-11-23 ENCOUNTER — Other Ambulatory Visit: Payer: Self-pay | Admitting: Internal Medicine

## 2022-11-23 DIAGNOSIS — I10 Essential (primary) hypertension: Secondary | ICD-10-CM

## 2022-12-01 ENCOUNTER — Other Ambulatory Visit: Payer: Self-pay | Admitting: Internal Medicine

## 2022-12-26 DIAGNOSIS — N184 Chronic kidney disease, stage 4 (severe): Secondary | ICD-10-CM | POA: Diagnosis not present

## 2022-12-26 DIAGNOSIS — I2510 Atherosclerotic heart disease of native coronary artery without angina pectoris: Secondary | ICD-10-CM | POA: Diagnosis not present

## 2023-01-05 ENCOUNTER — Other Ambulatory Visit (HOSPITAL_BASED_OUTPATIENT_CLINIC_OR_DEPARTMENT_OTHER): Payer: Self-pay | Admitting: Cardiovascular Disease

## 2023-01-05 ENCOUNTER — Emergency Department (HOSPITAL_COMMUNITY)
Admission: EM | Admit: 2023-01-05 | Discharge: 2023-01-05 | Disposition: A | Discharge status: Home / Self Care | Payer: Medicare Other | Attending: Emergency Medicine | Admitting: Emergency Medicine

## 2023-01-05 ENCOUNTER — Other Ambulatory Visit: Payer: Self-pay

## 2023-01-05 ENCOUNTER — Encounter (HOSPITAL_COMMUNITY): Payer: Self-pay | Admitting: *Deleted

## 2023-01-05 DIAGNOSIS — R531 Weakness: Secondary | ICD-10-CM | POA: Diagnosis not present

## 2023-01-05 DIAGNOSIS — N189 Chronic kidney disease, unspecified: Secondary | ICD-10-CM | POA: Diagnosis not present

## 2023-01-05 DIAGNOSIS — Z7982 Long term (current) use of aspirin: Secondary | ICD-10-CM | POA: Insufficient documentation

## 2023-01-05 DIAGNOSIS — E1122 Type 2 diabetes mellitus with diabetic chronic kidney disease: Secondary | ICD-10-CM | POA: Insufficient documentation

## 2023-01-05 DIAGNOSIS — Z794 Long term (current) use of insulin: Secondary | ICD-10-CM | POA: Diagnosis not present

## 2023-01-05 DIAGNOSIS — E1165 Type 2 diabetes mellitus with hyperglycemia: Secondary | ICD-10-CM | POA: Insufficient documentation

## 2023-01-05 DIAGNOSIS — R0602 Shortness of breath: Secondary | ICD-10-CM | POA: Insufficient documentation

## 2023-01-05 DIAGNOSIS — N179 Acute kidney failure, unspecified: Secondary | ICD-10-CM | POA: Diagnosis not present

## 2023-01-05 DIAGNOSIS — R739 Hyperglycemia, unspecified: Secondary | ICD-10-CM | POA: Diagnosis not present

## 2023-01-05 DIAGNOSIS — K563 Gallstone ileus: Secondary | ICD-10-CM | POA: Diagnosis not present

## 2023-01-05 DIAGNOSIS — R42 Dizziness and giddiness: Secondary | ICD-10-CM | POA: Diagnosis not present

## 2023-01-05 DIAGNOSIS — Z79899 Other long term (current) drug therapy: Secondary | ICD-10-CM | POA: Diagnosis not present

## 2023-01-05 DIAGNOSIS — I129 Hypertensive chronic kidney disease with stage 1 through stage 4 chronic kidney disease, or unspecified chronic kidney disease: Secondary | ICD-10-CM | POA: Diagnosis not present

## 2023-01-05 DIAGNOSIS — R1084 Generalized abdominal pain: Secondary | ICD-10-CM | POA: Diagnosis not present

## 2023-01-05 LAB — CBC
HCT: 35.9 % — ABNORMAL LOW (ref 36.0–46.0)
Hemoglobin: 11.5 g/dL — ABNORMAL LOW (ref 12.0–15.0)
MCH: 29 pg (ref 26.0–34.0)
MCHC: 32 g/dL (ref 30.0–36.0)
MCV: 90.7 fL (ref 80.0–100.0)
Platelets: 173 10*3/uL (ref 150–400)
RBC: 3.96 MIL/uL (ref 3.87–5.11)
RDW: 12.7 % (ref 11.5–15.5)
WBC: 6 10*3/uL (ref 4.0–10.5)
nRBC: 0 % (ref 0.0–0.2)

## 2023-01-05 LAB — URINALYSIS, ROUTINE W REFLEX MICROSCOPIC
Bacteria, UA: NONE SEEN
Bilirubin Urine: NEGATIVE
Glucose, UA: 150 mg/dL — AB
Hgb urine dipstick: NEGATIVE
Ketones, ur: NEGATIVE mg/dL
Nitrite: NEGATIVE
Protein, ur: NEGATIVE mg/dL
Specific Gravity, Urine: 1.014 (ref 1.005–1.030)
pH: 5 (ref 5.0–8.0)

## 2023-01-05 LAB — CBG MONITORING, ED
Glucose-Capillary: 288 mg/dL — ABNORMAL HIGH (ref 70–99)
Glucose-Capillary: 204 mg/dL — ABNORMAL HIGH (ref 70–99)

## 2023-01-05 LAB — BASIC METABOLIC PANEL
Anion gap: 8 (ref 5–15)
BUN: 70 mg/dL — ABNORMAL HIGH (ref 8–23)
CO2: 18 mmol/L — ABNORMAL LOW (ref 22–32)
Calcium: 9.1 mg/dL (ref 8.9–10.3)
Chloride: 107 mmol/L (ref 98–111)
Creatinine, Ser: 2.55 mg/dL — ABNORMAL HIGH (ref 0.44–1.00)
GFR, Estimated: 20 mL/min — ABNORMAL LOW (ref >60)
Glucose, Bld: 316 mg/dL — ABNORMAL HIGH (ref 70–99)
Potassium: 4.3 mmol/L (ref 3.5–5.1)
Sodium: 133 mmol/L — ABNORMAL LOW (ref 135–145)

## 2023-01-05 MED ORDER — LACTATED RINGERS IV BOLUS
1000.0000 mL | Freq: Once | INTRAVENOUS | Status: AC
  Administered 2023-01-05: 1000 mL via INTRAVENOUS

## 2023-01-05 NOTE — ED Triage Notes (Signed)
BIB EMS from an event. ? Dizziness and weakness from hot in area of building. C/o abd pain, history of gallstones and ovarian cyst. CBG 351 132/[id number]% No EKG, No IV BLS truck responded

## 2023-01-05 NOTE — Discharge Instructions (Signed)
You were seen in the emergency department for your weakness and dizziness.  Your blood sugar was high and you did appear slightly dehydrated on your labs.  You are likely overheated today and dehydrated.  We gave you fluids in the emergency department.  He should continue to take your insulin as prescribed.  You should follow-up with your primary doctor in the next few days to have your blood sugar and kidney function rechecked.  You should return to the emergency department if you have continued dizziness and pass out, you have severe chest pain, you are not urinating or if you have any other new or concerning symptoms.

## 2023-01-05 NOTE — Telephone Encounter (Signed)
Rx request sent to pharmacy.  

## 2023-01-05 NOTE — ED Provider Notes (Signed)
[facility] AT [facility] Provider Note   CSN: [id number] Arrival date & time: 01/05/23  1500     History  Chief Complaint  Patient presents with   Weakness   Dizziness    Kimberly [name] is a 67 y.o. female.  Patient is a 67 year old female with a past medical history of hypertension, diabetes, CKD presenting to the emergency department with lightheadedness and weakness.  The patient states that she was at a convention shortly prior to arrival.  She states that she was indoors but it was very hot in the facility that they were at.  She states that she started to feel lightheaded like she might pass out but did not sit down and did not pass out.  She denies any associated chest pain.  She states that she felt short of breath.  She states that the dizziness has resolved but she feels generally weak all over.  She states that she has been eating and drinking normally today and denies any recent nausea, vomiting or diarrhea.  Patient reports to me that she has chronic left-sided pain of unknown etiology has no changes to this pain today.  The history is provided by the patient.  Weakness Associated symptoms: dizziness   Dizziness Associated symptoms: weakness        Home Medications Prior to Admission medications   Medication Sig Start Date End Date Taking? Authorizing Provider  albuterol (VENTOLIN HFA) 108 (90 Base) MCG/ACT inhaler Inhale 2 puffs into the lungs every 4 (four) hours as needed. ADDITIONAL REFILLS FROM PCP 03/15/22   Chilton Si, MD  amLODipine (NORVASC) 10 MG tablet TAKE 1 TABLET BY MOUTH DAILY 11/23/22   Etta Grandchild, MD  aspirin EC 81 MG tablet Take 81 mg by mouth daily. Swallow whole.    [provider]  Blood Glucose Monitoring Suppl (CONTOUR NEXT MONITOR) w/Device KIT Use to test blood sugars 4 times daily. Dx: e11.9 05/08/20   [place], Betty G, MD  cetirizine (ZYRTEC) 10 MG tablet Take 1 tablet (10 mg total) by  mouth daily. 06/08/20   Deeann Saint, MD  chlorthalidone (HYGROTON) 25 MG tablet TAKE 1 TABLET BY MOUTH DAILY 01/05/23   Chilton Si, MD  colchicine 0.6 MG tablet Take 1 tablet (0.6 mg total) by mouth daily. 09/06/22   Etta Grandchild, MD  Continuous Glucose Receiver (FREESTYLE LIBRE 2 READER) DEVI READER DEVICE TO USE WITH SENSORS 11/11/22   Etta Grandchild, MD  Continuous Glucose Sensor (FREESTYLE LIBRE 2 SENSOR) MISC TEST BLOOD SUGAR THREE TIMES A DAY AS NEEDED AND CHANGE SENSOR EVERY 2 WEEKS 11/06/22   Etta Grandchild, MD  Evolocumab (REPATHA) 140 MG/ML SOSY Inject 140 mg into the skin every 14 (fourteen) days. 10/27/22   Etta Grandchild, MD  Finerenone (KERENDIA) 20 MG TABS Take 1 tablet by mouth daily. 06/12/22   Etta Grandchild, MD  fluticasone (FLONASE) 50 MCG/ACT nasal spray Place 1 spray into both nostrils daily. 06/08/20   Deeann Saint, MD  glucose blood (CONTOUR NEXT TEST) test strip USE TO TEST BLOOD SUGARS 4 TIMES A DAY 12/01/22   Etta Grandchild, MD  insulin glargine (LANTUS SOLOSTAR) 100 UNIT/ML Solostar Pen Inject 50 Units into the skin daily. 10/27/22   Etta Grandchild, MD  insulin lispro (HUMALOG) 200 UNIT/ML KwikPen Inject 5 Units into the skin 3 (three) times daily with meals. 10/27/22   Etta Grandchild, MD  Insulin Pen Needle (BD PEN  NEEDLE NANO 2ND GEN) 32G X 4 MM MISC USE AS DIRECTED ONCE DAILY 05/03/22   Etta Grandchild, MD  losartan (COZAAR) 50 MG tablet TAKE ONE TABLET BY MOUTH DAILY 05/26/22   Chilton Si, MD  Microlet Lancets MISC USE TO CHECK BLOOD SUGARS 4 TIMES A DAY 12/01/22   Etta Grandchild, MD  Pitavastatin Calcium 1 MG TABS Take 1 tablet (1 mg total) by mouth daily. 10/29/22   Etta Grandchild, MD  zinc gluconate 50 MG tablet TAKE 1 TABLET  BY MOUTH DAILY 09/01/22   Etta Grandchild, MD      Allergies    Semaglutide(0.25 or 0.5mg -dos), Ace inhibitors, and Rosuvastatin    Review of Systems   Review of Systems  Neurological:  Positive for dizziness and  weakness.    Physical Exam Updated Vital Signs BP (!) 133/54 (BP Location: Right Arm)   Pulse 72   Temp (!) 97.4 F (36.3 C) (Oral)   Ht 5\' 11"  (1.803 m)   Wt 83.5 kg   SpO2 96%   BMI 25.66 kg/m  Physical Exam Vitals and nursing note reviewed.  Constitutional:      General: She is not in acute distress.    Appearance: Normal appearance.  HENT:     Head: Normocephalic and atraumatic.     Nose: Nose normal.     Mouth/Throat:     Mouth: Mucous membranes are moist.     Pharynx: Oropharynx is clear.  Eyes:     Extraocular Movements: Extraocular movements intact.     Conjunctiva/sclera: Conjunctivae normal.     Pupils: Pupils are equal, round, and reactive to light.  Cardiovascular:     Rate and Rhythm: Normal rate and regular rhythm.     Heart sounds: Normal heart sounds.  Pulmonary:     Effort: Pulmonary effort is normal.     Breath sounds: Normal breath sounds.  Abdominal:     General: Abdomen is flat.     Palpations: Abdomen is soft.     Tenderness: There is no abdominal tenderness.  Musculoskeletal:        General: Normal range of motion.     Cervical back: Normal range of motion.  Skin:    General: Skin is warm and dry.  Neurological:     General: No focal deficit present.     Mental Status: She is alert and oriented to person, place, and time.     Cranial Nerves: No cranial nerve deficit.     Sensory: No sensory deficit.     Motor: No weakness.     Coordination: Coordination normal.  Psychiatric:        Mood and Affect: Mood normal.        Behavior: Behavior normal.     ED Results / Procedures / Treatments   Labs (all labs ordered are listed, but only abnormal results are displayed) Labs Reviewed  BASIC METABOLIC PANEL - Abnormal; Notable for the following components:      Result Value   Sodium 133 (*)    CO2 18 (*)    Glucose, Bld 316 (*)    BUN 70 (*)    Creatinine, Ser 2.55 (*)    GFR, Estimated 20 (*)    All other components within normal  limits  CBC - Abnormal; Notable for the following components:   Hemoglobin 11.5 (*)    HCT 35.9 (*)    All other components within normal limits  URINALYSIS, ROUTINE W REFLEX MICROSCOPIC -  Abnormal; Notable for the following components:   Color, Urine STRAW (*)    Glucose, UA 150 (*)    Leukocytes,Ua TRACE (*)    All other components within normal limits  CBG MONITORING, ED - Abnormal; Notable for the following components:   Glucose-Capillary 288 (*)    All other components within normal limits  CBG MONITORING, ED    EKG EKG Interpretation Date/Time:  Thursday January 05 2023 15:47:12 EDT Ventricular Rate:  70 PR Interval:  176 QRS Duration:  87 QT Interval:  403 QTC Calculation: 435 R Axis:   -19  Text Interpretation: Sinus rhythm Left ventricular hypertrophy Anterior Q waves, possibly due to LVH Borderline T abnormalities, inferior leads Since last tracing of earlier today No significant change was found Confirmed by Elayne Snare ([id number]) on 01/05/2023 4:16:05 PM  Radiology No results found.  Procedures Procedures    Medications Ordered in ED Medications  lactated ringers bolus 1,000 mL (1,000 mLs Intravenous New Bag/Given 01/05/23 1602)    ED Course/ Medical Decision Making/ A&P Clinical Course as of 01/05/23 1751  Thu Jan 05, 2023  1647 Mild AKI with Cr 2.1 from baseline 2.5, mildly low bicarb but no elevated AGAP making DKA unlikely. She is receiving IVF. [VK]    Clinical Course User Index [VK] Rexford Maus, DO                             Medical Decision Making This patient presents to the ED with chief complaint(s) of weakness, dizziness with pertinent past medical history of hypertension, diabetes which further complicates the presenting complaint. The complaint involves an extensive differential diagnosis and also carries with it a high risk of complications and morbidity.    The differential diagnosis includes arrhythmia, anemia, dehydration,  electrolyte abnormality, hypo or hyperglycemia, hyperglycemic crisis, heat illness, no focal neurologic deficits making CVA unlikely  Additional history obtained: Additional history obtained from N/A Records reviewed Primary Care Documents  ED Course and Reassessment: On patient's arrival to the emergency department she is hemodynamically stable in no acute distress.  She was hyperglycemic for EMS and had repeat Accu-Chek performed here that was 288.  She will be started on IV fluids for her hyperglycemia and for possible dehydration of her weakness or near syncope.  EKG on arrival showed normal sinus rhythm without acute ischemic changes.  She will have labs performed and will be closely reassessed.  Independent labs interpretation:  The following labs were independently interpreted: hyperglycemia without DKA, mild AKI on CKD  Independent visualization of imaging: - N/A  Consultation: - Consulted or discussed management/test interpretation w/ external professional: N/A  Consideration for admission or further workup: Patient has no emergent conditions requiring admission or further work-up at this time and is stable for discharge home with primary care follow-up  Social Determinants of health: N/A    Amount and/or Complexity of Data Reviewed Labs: ordered.          Final Clinical Impression(s) / ED Diagnoses Final diagnoses:  Hyperglycemia  AKI (acute kidney injury) [facility])    Rx / DC Orders ED Discharge Orders     None         Rexford Maus, DO 01/05/23 1751

## 2023-01-10 ENCOUNTER — Telehealth: Payer: Self-pay

## 2023-01-10 DIAGNOSIS — K080 Exfoliation of teeth due to systemic causes: Secondary | ICD-10-CM | POA: Diagnosis not present

## 2023-01-10 NOTE — Telephone Encounter (Signed)
Transition Care Management Unsuccessful Follow-up Telephone Call  Date of discharge and from where:  01/05/2023 [facility]  Attempts:  1st Attempt  Reason for unsuccessful TCM follow-up call:  Left voice message  Keyston Ardolino [facility]  [facility] Population Health Community Resource Care Guide   [email or web address]  ?? [phone]   Website: [email or web address]  [email or web address]

## 2023-01-11 ENCOUNTER — Telehealth: Payer: Self-pay

## 2023-01-11 NOTE — Telephone Encounter (Signed)
Transition Care Management Unsuccessful Follow-up Telephone Call  Date of discharge and from where:  01/05/2023 [facility]  Attempts:  2nd Attempt  Reason for unsuccessful TCM follow-up call:  Left voice message  Aimie Wagman [facility]  [facility] Population Health Community Resource Care Guide   [email or web address]  ?? [phone]   Website: [email or web address]  [email or web address]

## 2023-01-16 ENCOUNTER — Encounter: Payer: Self-pay | Admitting: Internal Medicine

## 2023-01-16 ENCOUNTER — Ambulatory Visit (INDEPENDENT_AMBULATORY_CARE_PROVIDER_SITE_OTHER): Payer: Medicare Other | Admitting: Internal Medicine

## 2023-01-16 VITALS — BP 124/52 | HR 74 | Temp 98.1°F | Ht 71.0 in | Wt 190.0 lb

## 2023-01-16 DIAGNOSIS — N184 Chronic kidney disease, stage 4 (severe): Secondary | ICD-10-CM

## 2023-01-16 DIAGNOSIS — D631 Anemia in chronic kidney disease: Secondary | ICD-10-CM

## 2023-01-16 DIAGNOSIS — E1140 Type 2 diabetes mellitus with diabetic neuropathy, unspecified: Secondary | ICD-10-CM | POA: Diagnosis not present

## 2023-01-16 DIAGNOSIS — I10 Essential (primary) hypertension: Secondary | ICD-10-CM

## 2023-01-16 DIAGNOSIS — N1832 Chronic kidney disease, stage 3b: Secondary | ICD-10-CM | POA: Diagnosis not present

## 2023-01-16 DIAGNOSIS — E119 Type 2 diabetes mellitus without complications: Secondary | ICD-10-CM

## 2023-01-16 DIAGNOSIS — Z794 Long term (current) use of insulin: Secondary | ICD-10-CM | POA: Diagnosis not present

## 2023-01-16 LAB — BASIC METABOLIC PANEL
BUN: 48 mg/dL — ABNORMAL HIGH (ref 6–23)
CO2: 23 mEq/L (ref 19–32)
Calcium: 9.7 mg/dL (ref 8.4–10.5)
Chloride: 105 mEq/L (ref 96–112)
Creatinine, Ser: 2.11 mg/dL — ABNORMAL HIGH (ref 0.40–1.20)
GFR: 23.5 mL/min — ABNORMAL LOW (ref >60.00)
Glucose, Bld: 213 mg/dL — ABNORMAL HIGH (ref 70–99)
Potassium: 4.3 mEq/L (ref 3.5–5.1)
Sodium: 138 mEq/L (ref 135–145)

## 2023-01-16 LAB — HEMOGLOBIN A1C: Hgb A1c MFr Bld: 8.8 % — ABNORMAL HIGH (ref 4.6–6.5)

## 2023-01-16 NOTE — Patient Instructions (Signed)
Chronic Kidney Disease, Adult Chronic kidney disease (CKD) occurs when the kidneys are slowly and permanently damaged over a long period of time. The kidneys are a pair of organs that do many important jobs in the body, including: Removing waste and extra fluid from the blood to make urine. Making hormones that maintain the amount of fluid in tissues and blood vessels. Maintaining the right amount of fluids and chemicals in the body. A small amount of kidney damage may not cause problems, but a large amount of damage may make it hard or impossible for the kidneys to work right. Steps must be taken to slow kidney damage or to stop it from getting worse. If steps are not taken, the kidneys may stop working permanently (end-stage renal disease, or ESRD). Most of the time, CKD does not go away, but it can often be controlled. People who have CKD are usually able to live full lives. What are the causes? The most common causes of this condition are diabetes and high blood pressure (hypertension). Other causes include: Cardiovascular diseases. These affect the heart and blood vessels. Kidney diseases. These include: Glomerulonephritis, or inflammation of the tiny filters in the kidneys. Interstitial nephritis. This is swelling of the small tubes of the kidneys and of the surrounding structures. Polycystic kidney disease, in which clusters of fluid-filled sacs form within the kidneys. Renal vascular disease. This includes disorders that affect the arteries and veins of the kidneys. Diseases that affect the body's defense system (immune system). A problem with urine flow. This may be caused by: Kidney stones. Cancer. An enlarged prostate, in males. A kidney infection or urinary tract infection (UTI) that keeps coming back. Vasculitis. This is swelling or inflammation of the blood vessels. What increases the risk? Your chances of having kidney disease increase with age. The following factors may make  you more likely to develop this condition: A family history of kidney disease or kidney failure. Kidney failure means the kidneys can no longer work right. Certain genetic diseases. Taking medicines often that are damaging to the kidneys. Being around or being in contact with toxic substances. Obesity. A history of tobacco use. What are the signs or symptoms? Symptoms of this condition include: Feeling very tired (lethargic) and having less energy. Swelling, or edema, of the face, legs, ankles, or feet. Nausea or vomiting, or loss of appetite. Confusion or trouble concentrating. Muscle twitches and cramps, especially in the legs. Dry, itchy skin. A metallic taste in the mouth. Producing less urine, or producing more urine (especially at night). Shortness of breath. Trouble sleeping. CKD may also result in not having enough red blood cells or hemoglobin in the blood (anemia) or having weak bones (bone disease). Symptoms develop slowly and may not be obvious until the kidney damage becomes severe. It is possible to have kidney disease for years without having symptoms. How is this diagnosed? This condition may be diagnosed based on: Blood tests. Urine tests. Imaging tests, such as an ultrasound or a CT scan. A kidney biopsy. This involves removing a sample of kidney tissue to be looked at under a microscope. Results from these tests will help to determine how serious the CKD is. How is this treated? There is no cure for most cases of this condition, but treatment usually relieves symptoms and prevents or slows the worsening of the disease. Treatment may include: Diet changes, which may require you to avoid alcohol and foods that are high in salt, potassium, phosphorous, and protein. Medicines. These may:  Lower blood pressure. Control blood sugar (glucose). Relieve anemia. Relieve swelling. Protect your bones. Improve the balance of salts and minerals in your blood  (electrolytes). Dialysis, which is a type of treatment that removes toxic waste from the body. It may be needed if you have kidney failure. Managing any other conditions that are causing your CKD or making it worse. Follow these instructions at home: Medicines Take over-the-counter and prescription medicines only as told by your health care provider. The amount of some medicines that you take may need to be changed. Do not take any new medicines unless approved by your health care provider. Many medicines can make kidney damage worse. Do not take any vitamin and mineral supplements unless approved by your health care provider. Many nutritional supplements can make kidney damage worse. Lifestyle  Do not use any products that contain nicotine or tobacco, such as cigarettes, e-cigarettes, and chewing tobacco. If you need help quitting, ask your health care provider. If you drink alcohol: Limit how much you use to: 0-1 drink a day for women who are not pregnant. 0-2 drinks a day for men. Know how much alcohol is in your drink. In the [place] one drink equals one 12 oz bottle of beer (355 mL), one 5 oz glass of wine (148 mL), or one 1 oz glass of hard liquor (44 mL). Maintain a healthy weight. If you need help, ask your health care provider. General instructions  Follow instructions from your health care provider about eating or drinking restrictions, including any prescribed diet. Track your blood pressure at home. Report changes in your blood pressure as told. If you are being treated for diabetes, track your blood glucose levels as told. Start or continue an exercise plan. Exercise at least 30 minutes a day, 5 days a week. Keep your immunizations up to date as told. Keep all follow-up visits. This is important. Where to find more information American Association of Kidney Patients: ResidentialShow.is [employer]: www.kidney.org American Kidney Fund: FightingMatch.com.ee Life Options:  www.lifeoptions.org Kidney School: www.kidneyschool.org Contact a health care provider if: Your symptoms get worse. You develop new symptoms. Get help right away if: You develop symptoms of ESRD. These include: Headaches. Numbness in your hands or feet. Easy bruising. Frequent hiccups. Chest pain. Shortness of breath. Lack of menstrual periods, in women. You have a fever. You are producing less urine than usual. You have pain or bleeding when you urinate or when you have a bowel movement. These symptoms may represent a serious problem that is an emergency. Do not wait to see if the symptoms will go away. Get medical help right away. Call your local emergency services (911 in the [place] Do not drive yourself to the hospital. Summary Chronic kidney disease (CKD) occurs when the kidneys become damaged slowly over a long period of time. The most common causes of this condition are diabetes and high blood pressure (hypertension). There is no cure for most cases of CKD, but treatment usually relieves symptoms and prevents or slows the worsening of the disease. Treatment may include a combination of lifestyle changes, medicines, and dialysis. This information is not intended to replace advice given to you by your health care provider. Make sure you discuss any questions you have with your health care provider. Document Revised: 09/13/2019 Document Reviewed: 09/18/2019 Elsevier Patient Education  2024 [employer].

## 2023-01-16 NOTE — Progress Notes (Signed)
Subjective:  Patient ID: Kimberly [name], female    DOB: 1956/06/27  Age: 67 y.o. MRN: [id number]  CC: Hypertension and Diabetes   HPI Kimberly [name] presents for f/up ---  Discussed the use of AI scribe software for clinical note transcription with the patient, who gave verbal consent to proceed.  History of Present Illness   The patient, with a history of kidney disease, diabetes, and hypertension, presents with recent dehydration after attending a crowded event. She reports feeling hot and dizzy during the event, but currently feels fine except for increased fatigue. She also complains of pain in the heel of her left foot. She denies any chest pain, shortness of breath, or dizziness.  The patient's recent blood tests show anemia, which she attributes to her kidney disease. She expresses concern about her last A1c, despite feeling like she was making progress in managing her diabetes. She reports occasional excessive urination, particularly at night, and admits to drinking water during these nocturnal bathroom visits.  The patient has noticed a weight gain to 190 pounds, which she finds concerning. She has not had an eye exam or mammogram in the past year, but plans to do so this year.  She also mentions a recent visit to a vascular specialist for blood flow issues, but reports that the doctor did not recommend any interventions. She experiences pain in her left foot when walking.  The patient's kidney function has declined recently, from 25 to 20, which she attributes to a recent dehydration episode. She has not seen her nephrologist, Dr. Thedore Mins, since this decline. She expresses a strong aversion to the idea of dialysis, viewing it as a last resort.       Outpatient Medications Prior to Visit  Medication Sig Dispense Refill   albuterol (VENTOLIN HFA) 108 (90 Base) MCG/ACT inhaler Inhale 2 puffs into the lungs every 4 (four) hours as needed. ADDITIONAL REFILLS FROM PCP 1 each 0    amLODipine (NORVASC) 10 MG tablet TAKE 1 TABLET BY MOUTH DAILY 90 tablet 1   aspirin EC 81 MG tablet Take 81 mg by mouth daily. Swallow whole.     Blood Glucose Monitoring Suppl (CONTOUR NEXT MONITOR) w/Device KIT Use to test blood sugars 4 times daily. Dx: e11.9 1 kit 1   cetirizine (ZYRTEC) 10 MG tablet Take 1 tablet (10 mg total) by mouth daily. 30 tablet 0   colchicine 0.6 MG tablet Take 1 tablet (0.6 mg total) by mouth daily. 90 tablet 0   Continuous Glucose Receiver (FREESTYLE LIBRE 2 READER) DEVI READER DEVICE TO USE WITH SENSORS 1 each 3   Continuous Glucose Sensor (FREESTYLE LIBRE 2 SENSOR) MISC TEST BLOOD SUGAR THREE TIMES A DAY AS NEEDED AND CHANGE SENSOR EVERY 2 WEEKS 2 each 5   Evolocumab (REPATHA) 140 MG/ML SOSY Inject 140 mg into the skin every 14 (fourteen) days. 6.3 mL 1   Finerenone (KERENDIA) 20 MG TABS Take 1 tablet by mouth daily. 90 tablet 1   fluticasone (FLONASE) 50 MCG/ACT nasal spray Place 1 spray into both nostrils daily. 16 g 0   glucose blood (CONTOUR NEXT TEST) test strip USE TO TEST BLOOD SUGARS 4 TIMES A DAY 400 strip 2   Insulin Pen Needle (BD PEN NEEDLE NANO 2ND GEN) 32G X 4 MM MISC USE AS DIRECTED ONCE DAILY 100 each 1   losartan (COZAAR) 50 MG tablet TAKE ONE TABLET BY MOUTH DAILY 90 tablet 3   Microlet Lancets MISC USE TO CHECK BLOOD  SUGARS 4 TIMES A DAY 400 each 12   Pitavastatin Calcium 1 MG TABS Take 1 tablet (1 mg total) by mouth daily. 90 tablet 1   zinc gluconate 50 MG tablet TAKE 1 TABLET  BY MOUTH DAILY 90 tablet 1   chlorthalidone (HYGROTON) 25 MG tablet TAKE 1 TABLET BY MOUTH DAILY 90 tablet 1   insulin glargine (LANTUS SOLOSTAR) 100 UNIT/ML Solostar Pen Inject 50 Units into the skin daily. 45 mL 1   insulin lispro (HUMALOG) 200 UNIT/ML KwikPen Inject 5 Units into the skin 3 (three) times daily with meals. 21 mL 1   No facility-administered medications prior to visit.    ROS Review of Systems  Constitutional:  Positive for fatigue. Negative for  appetite change, diaphoresis and unexpected weight change.  HENT: Negative.    Eyes: Negative.   Respiratory: Negative.  Negative for apnea, cough, shortness of breath and wheezing.   Cardiovascular:  Negative for chest pain, palpitations and leg swelling.  Gastrointestinal:  Negative for abdominal pain, constipation, diarrhea, nausea and vomiting.  Genitourinary: Negative.  Negative for difficulty urinating.  Musculoskeletal: Negative.   Skin: Negative.   Neurological: Negative.  Negative for dizziness, weakness and light-headedness.  Hematological:  Negative for adenopathy. Does not bruise/bleed easily.  Psychiatric/Behavioral: Negative.      Objective:  BP (!) 124/52 (BP Location: Left Arm, Patient Position: Sitting, Cuff Size: Large)   Pulse 74   Temp 98.1 F (36.7 C) (Oral)   Ht 5\' 11"  (1.803 m)   Wt 190 lb (86.2 kg)   SpO2 93%   BMI 26.50 kg/m   BP Readings from Last 3 Encounters:  01/16/23 (!) 124/52  01/05/23 (!) 133/54  11/07/22 126/72    Wt Readings from Last 3 Encounters:  01/16/23 190 lb (86.2 kg)  01/05/23 184 lb (83.5 kg)  11/14/22 185 lb (83.9 kg)    Physical Exam Vitals reviewed.  Constitutional:      Appearance: Normal appearance.  HENT:     Mouth/Throat:     Mouth: Mucous membranes are moist.  Eyes:     General: No scleral icterus.    Conjunctiva/sclera: Conjunctivae normal.  Cardiovascular:     Rate and Rhythm: Normal rate and regular rhythm.     Pulses:          Dorsalis pedis pulses are 1+ on the right side and 1+ on the left side.       Posterior tibial pulses are 1+ on the right side and 1+ on the left side.     Heart sounds: Murmur heard.     No gallop.  Pulmonary:     Effort: Pulmonary effort is normal.     Breath sounds: No stridor. No wheezing, rhonchi or rales.  Abdominal:     General: Abdomen is flat.     Palpations: There is no mass.     Tenderness: There is no abdominal tenderness. There is no guarding or rebound.     Hernia:  No hernia is present.  Musculoskeletal:        General: Normal range of motion.     Cervical back: Neck supple.     Right lower leg: No edema.     Left lower leg: No edema.     Right foot: Normal range of motion.     Left foot: Normal range of motion. No deformity.  Feet:     Right foot:     Skin integrity: Skin integrity normal.     Left  foot:     Skin integrity: Skin integrity normal.  Skin:    General: Skin is warm and dry.  Neurological:     General: No focal deficit present.     Mental Status: She is alert. Mental status is at baseline.  Psychiatric:        Mood and Affect: Mood normal.        Behavior: Behavior normal.     Lab Results  Component Value Date   WBC 6.0 01/05/2023   HGB 11.5 (L) 01/05/2023   HCT 35.9 (L) 01/05/2023   PLT 173 01/05/2023   GLUCOSE 213 (H) 01/16/2023   CHOL 275 (H) 10/27/2022   TRIG 249.0 (H) 10/27/2022   HDL 44.40 10/27/2022   LDLDIRECT 162.0 10/27/2022   LDLCALC 138 (H) 01/28/2021   ALT 16 10/27/2022   AST 16 10/27/2022   NA 138 01/16/2023   K 4.3 01/16/2023   CL 105 01/16/2023   CREATININE 2.11 (H) 01/16/2023   BUN 48 (H) 01/16/2023   CO2 23 01/16/2023   TSH 1.83 10/27/2022   HGBA1C 8.8 (H) 01/16/2023   MICROALBUR 15 10/07/2022    No results found.  Assessment & Plan:   Anemia in stage 3b chronic kidney disease (HCC)  CKD (chronic kidney disease), stage IV (HCC)- Her renal function is stable. -     Ambulatory referral to Nephrology -     Basic metabolic panel; Future  Essential hypertension- Her blood pressure is well-controlled. -     Basic metabolic panel; Future  Type 2 diabetes mellitus with diabetic neuropathy, with long-term current use of insulin (HCC)- Her A1c is up to 8.8%. -     Hemoglobin A1c; Future     Follow-up: Return in about 3 months (around 04/18/2023).  Sanda Linger, MD

## 2023-01-20 MED ORDER — LANTUS SOLOSTAR 100 UNIT/ML ~~LOC~~ SOPN
60.0000 [IU] | PEN_INJECTOR | Freq: Every day | SUBCUTANEOUS | 1 refills | Status: DC
Start: 2023-01-20 — End: 2023-08-02

## 2023-01-27 ENCOUNTER — Institutional Professional Consult (permissible substitution): Payer: Medicare Other | Admitting: Internal Medicine

## 2023-01-31 ENCOUNTER — Other Ambulatory Visit: Payer: Self-pay

## 2023-01-31 ENCOUNTER — Ambulatory Visit: Payer: Medicare Other | Admitting: Family Medicine

## 2023-01-31 VITALS — BP 130/64 | Ht 71.0 in | Wt 190.0 lb

## 2023-01-31 DIAGNOSIS — G8929 Other chronic pain: Secondary | ICD-10-CM

## 2023-01-31 DIAGNOSIS — M1A30X0 Chronic gout due to renal impairment, unspecified site, without tophus (tophi): Secondary | ICD-10-CM | POA: Diagnosis not present

## 2023-01-31 DIAGNOSIS — M1711 Unilateral primary osteoarthritis, right knee: Secondary | ICD-10-CM | POA: Diagnosis not present

## 2023-01-31 DIAGNOSIS — M25561 Pain in right knee: Secondary | ICD-10-CM

## 2023-01-31 NOTE — Patient Instructions (Addendum)
Thank you for coming in today.   Call or go to the ER if you develop a large red swollen joint with extreme pain or oozing puss.    If we find gout crystals in the knee or you have another gout attack I will recommend starting on some gout prevention medicine.

## 2023-01-31 NOTE — Progress Notes (Signed)
Rubin Payor, PhD, LAT, ATC acting as a scribe for Clementeen Graham, MD.  Kimberly [name] is a 67 y.o. female who presents to [facility] today for cont'd R knee pain. Pt was previously seen by Dr. Katrinka Blazing on 09/08/22 and her R knee was aspirated and injected. Hx of gout.  Today, pt reports R knee pain returned about a wk ago. Pain is waking her up at night. She did the hydrotherapy chair at [employer] last night, which provided some relief. Pt locates pain to the anterior medial aspect of her R knee. Swelling present. No mechanical symptoms.   Dx testing: 09/08/22 Synovial fluid cultures 09/06/22 Labs & R knee XR  Pertinent review of systems: No fevers or chills  Relevant historical information: Hypertension elevated uric acid chronic kidney disease.   Exam:  BP 130/64   Ht 5\' 11"  (1.803 m)   Wt 190 lb (86.2 kg)   BMI 26.50 kg/m  General: Well Developed, well nourished, and in no acute distress.   MSK: Right knee large joint effusion otherwise normal. Normal motion. Mildly to palpation medial joint line. Intact strength.    Lab and Radiology Results  Procedure: Real-time Ultrasound Guided aspiration and injection of right knee joint superior patellar space Device: Philips Affiniti 50G/GE Logiq Images permanently stored and available for review in PACS Verbal informed consent obtained.  Discussed risks and benefits of procedure. Warned about infection, bleeding, hyperglycemia damage to structures among others. Patient expresses understanding and agreement Time-out conducted.   Noted no overlying erythema, induration, or other signs of local infection.   Skin prepped in a sterile fashion.   Local anesthesia: Topical Ethyl chloride.   With sterile technique and under real time ultrasound guidance: 2 mL of lidocaine injected into subcutaneous tissue achieving good anesthesia. Skin was again sterilized with isopropyl alcohol. 18-gauge needle  used to access the joint capsule. 45 mL of blood-tinged mildly cloudy yellow fluid aspirated. Syringe exchanged and 40 mg of Kenalog and 2 mL of Marcaine were injected into the knee joint. Joint capsule was seen to be decompressed following aspiration and injection Completed without difficulty   Pain immediately resolved suggesting accurate placement of the medication.   Advised to call if fevers/chills, erythema, induration, drainage, or persistent bleeding.   Images permanently stored and available for review in the ultrasound unit.  Impression: Technically successful ultrasound guided injection.   EXAM: RIGHT KNEE - COMPLETE 4+ VIEW   COMPARISON:  08/20/2020   FINDINGS: No fracture or dislocation of the right knee. Mild arthrosis of the femorotibial compartments with severe arthrosis of the patellofemoral compartment and large knee joint effusion. Vascular calcinosis. Soft tissue edema anteriorly.   IMPRESSION: 1. No fracture or dislocation of the right knee. 2. Mild arthrosis of the femorotibial compartments with severe arthrosis of the patellofemoral compartment and large knee joint effusion. 3. Soft tissue edema anteriorly.     Electronically Signed   By: Jearld Lesch M.D.   On: 09/06/2022 14:18 I, Clementeen Graham, personally (independently) visualized and performed the interpretation of the images attached in this note.       Assessment and Plan: 67 y.o. female with knee pain and effusion thought to be due to exacerbation of DJD.  Gout exacerbation is also a possibility but less likely.  Aspiration and injection performed today.  Will send fluid for analysis and culture based on her history of gout and the cloudiness of her fluid. Recheck as needed.  PDMP not reviewed this encounter. Orders Placed This Encounter  Procedures   Anaerobic and Aerobic Culture    Synovial fluid    Standing Status:   Future    Number of Occurrences:   1    Standing Expiration Date:    01/31/2024   [place] LIMITED JOINT SPACE STRUCTURES LOW RIGHT(NO LINKED CHARGES)    Order Specific Question:   Reason for Exam (SYMPTOM  OR DIAGNOSIS REQUIRED)    Answer:   right knee pain    Order Specific Question:   Preferred imaging location?    Answer:   [facility]   Synovial Fluid Analysis, Complete    Synovial fluid    Standing Status:   Future    Number of Occurrences:   1    Standing Expiration Date:   01/31/2024   No orders of the defined types were placed in this encounter.    Discussed warning signs or symptoms. Please see discharge instructions. Patient expresses understanding.   The above documentation has been reviewed and is accurate and complete Clementeen Graham, M.D.

## 2023-02-03 ENCOUNTER — Telehealth: Payer: Self-pay | Admitting: Family Medicine

## 2023-02-03 ENCOUNTER — Encounter: Payer: Self-pay | Admitting: Internal Medicine

## 2023-02-03 DIAGNOSIS — M109 Gout, unspecified: Secondary | ICD-10-CM

## 2023-02-03 MED ORDER — COLCHICINE 0.6 MG PO TABS
0.3000 mg | ORAL_TABLET | Freq: Every day | ORAL | 3 refills | Status: DC | PRN
Start: 2023-02-03 — End: 2023-11-06

## 2023-02-03 MED ORDER — ALLOPURINOL 100 MG PO TABS: 100.0000 mg | ORAL_TABLET | Freq: Every day | ORAL | 1 refills | Status: DC

## 2023-02-03 NOTE — Telephone Encounter (Signed)
Allopurinol and colchicine prescribed

## 2023-02-03 NOTE — Progress Notes (Signed)
Joint fluid shows gout crystals indicating that a lot of the pain and swelling you are experiencing was because of gout.  I have prescribed colchicine and allopurinol.  Colchicine is the pill that you take daily as needed for having a gout flare and allopurinol is a pill that you take daily to prevent gout.  I would like to see you back in 1 month so we can recheck your uric acid level and adjust these doses of medications.

## 2023-02-04 IMAGING — DX DG FOOT COMPLETE 3+V*L*
3 series · 3 of 3 positions shown · non-contrast
Comparison: 04/22/2019

CLINICAL DATA: Medial left foot pain first MTP region with
edema/erythema. History of diabetes with neuropathy. No injury.

EXAM:
LEFT FOOT - COMPLETE 3+ VIEW

[foot ap]
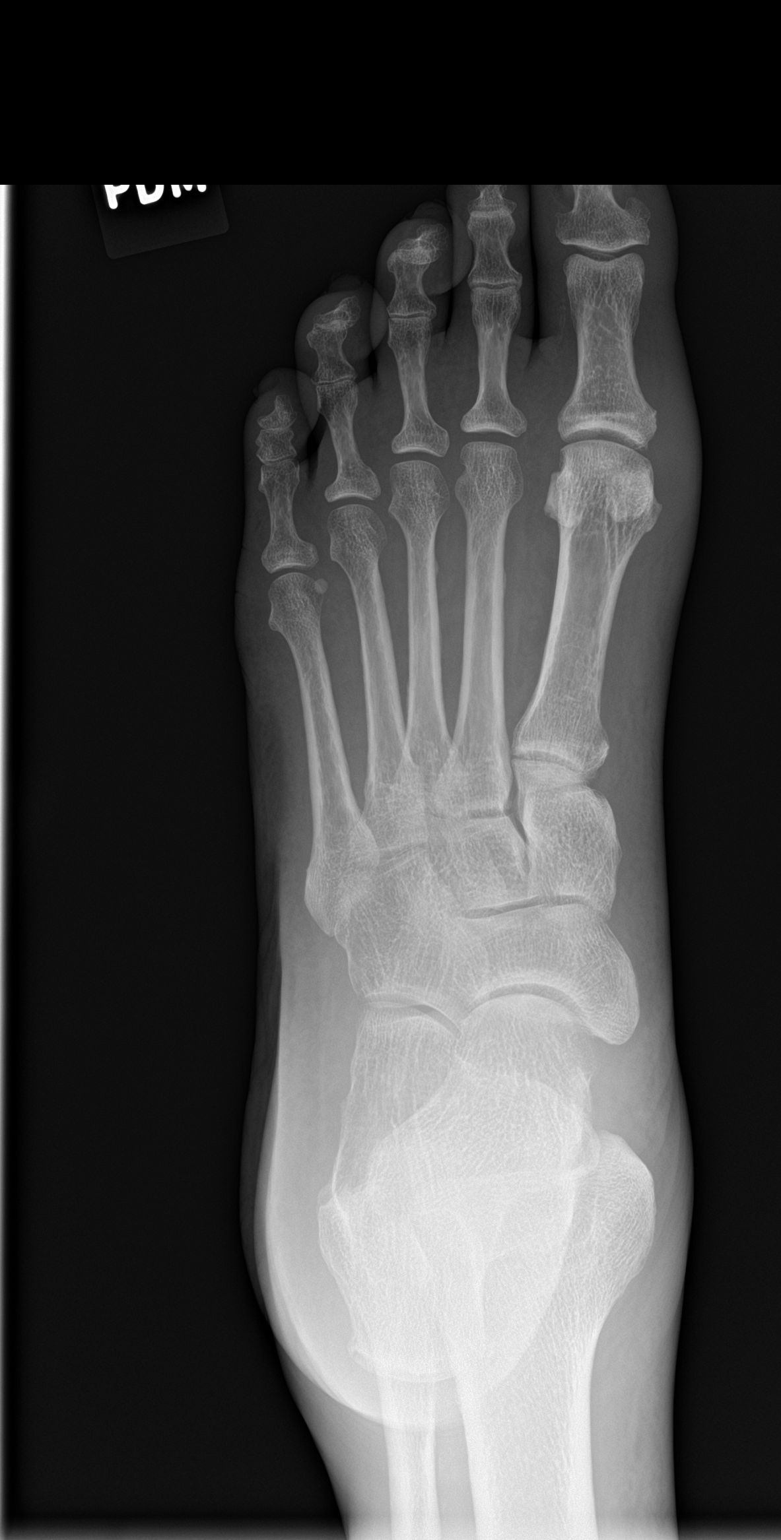

[foot obl]
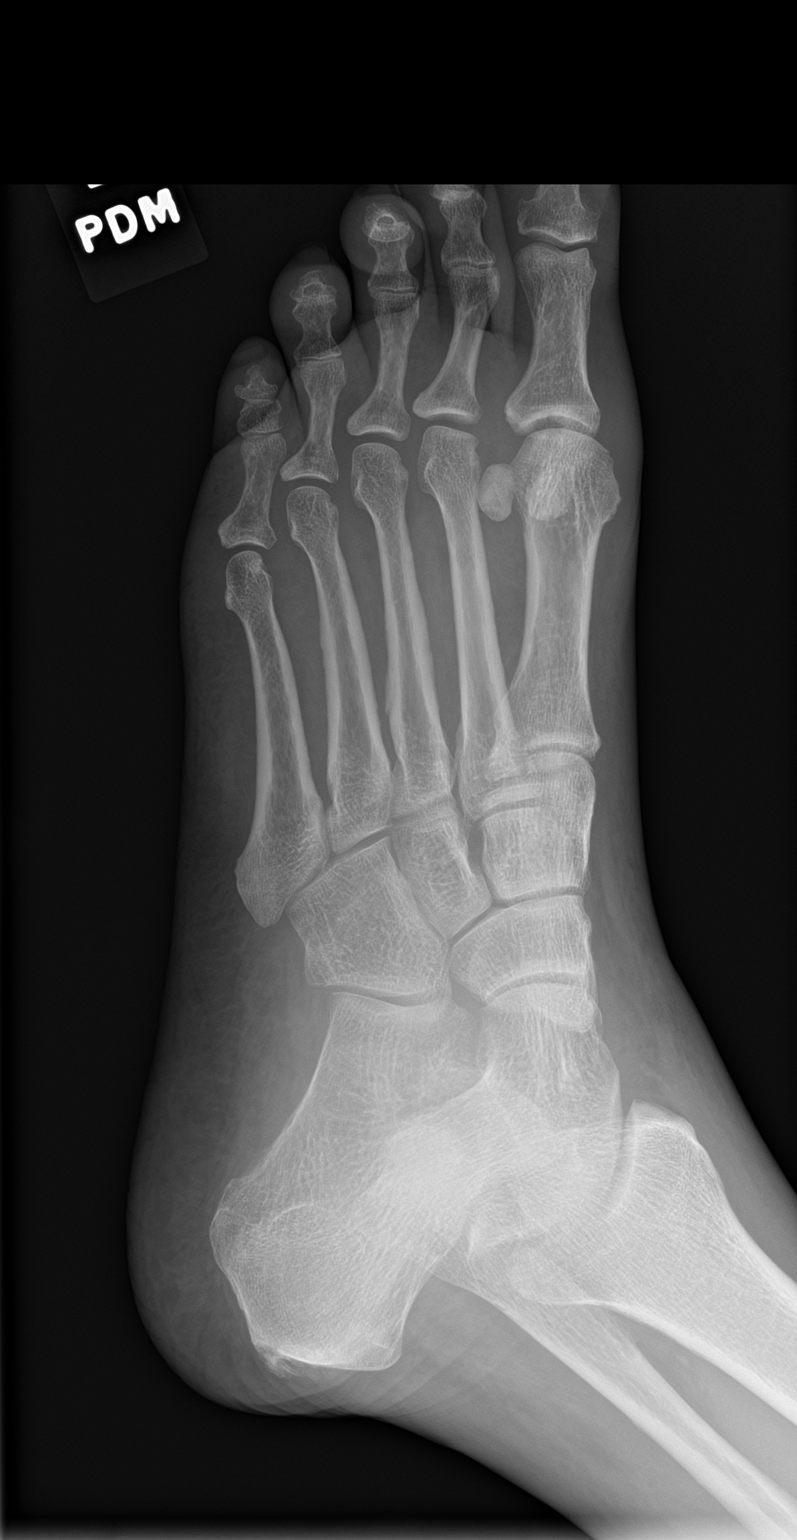

[foot lat]
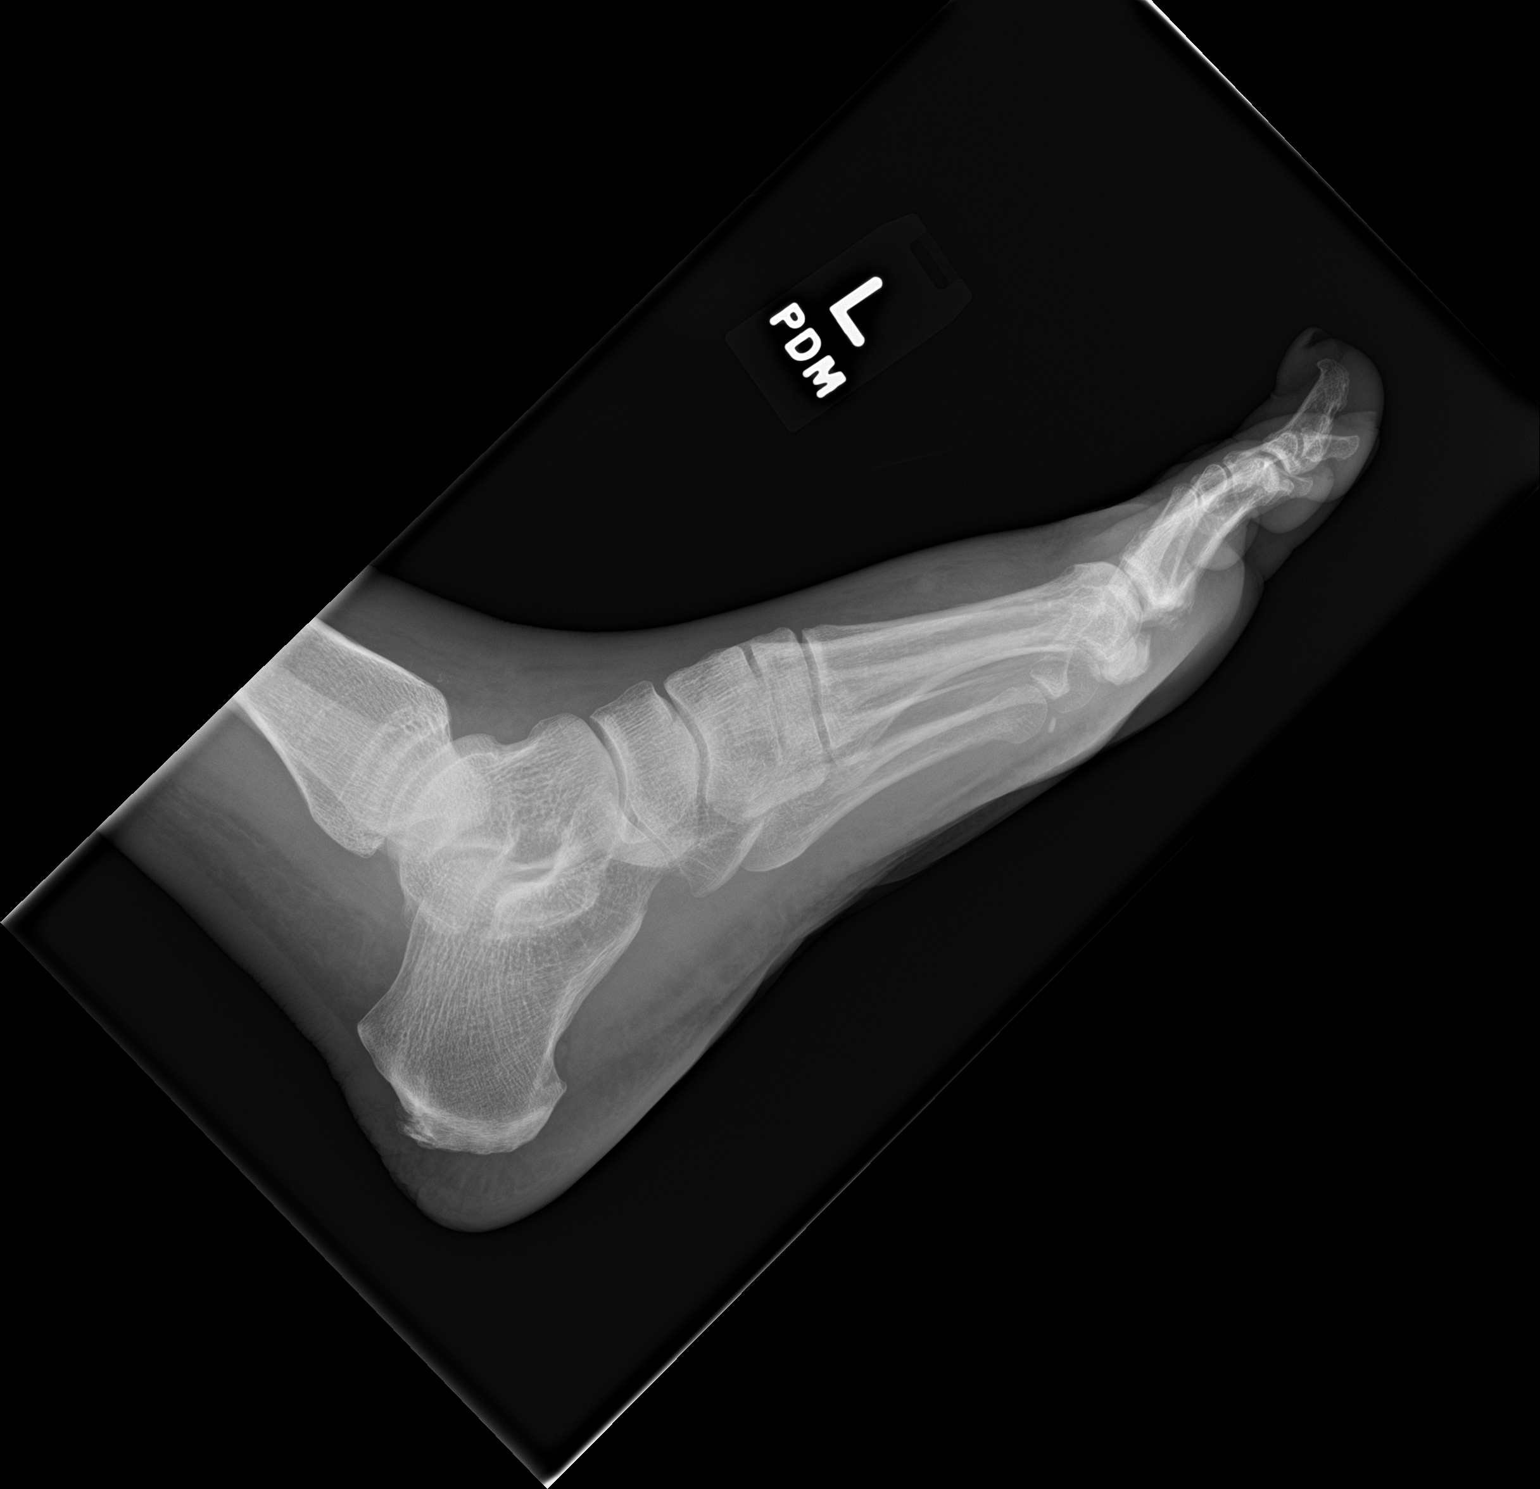

[3 of 3 positions shown; findings below may reference images not displayed]

FINDINGS: Minimal degenerative change at the first MTP joint. No evidence of
bony erosions. No fracture or dislocation. Remaining bones and soft
tissues are unremarkable.
IMPRESSION: 1. No acute findings.
2. Mild degenerative change at the first MTP joint.

## 2023-02-13 NOTE — Progress Notes (Signed)
No bacteria did grow from the joint fluid but there were gout crystals present.

## 2023-02-21 ENCOUNTER — Encounter: Payer: Self-pay | Admitting: Family Medicine

## 2023-02-21 ENCOUNTER — Ambulatory Visit: Payer: Medicare Other | Admitting: Family Medicine

## 2023-02-21 VITALS — BP 170/64 | HR 83 | Temp 98.4°F | Ht 71.0 in | Wt 195.4 lb

## 2023-02-21 DIAGNOSIS — J208 Acute bronchitis due to other specified organisms: Secondary | ICD-10-CM

## 2023-02-21 DIAGNOSIS — R0982 Postnasal drip: Secondary | ICD-10-CM | POA: Diagnosis not present

## 2023-02-21 DIAGNOSIS — M10379 Gout due to renal impairment, unspecified ankle and foot: Secondary | ICD-10-CM

## 2023-02-21 DIAGNOSIS — E119 Type 2 diabetes mellitus without complications: Secondary | ICD-10-CM

## 2023-02-21 DIAGNOSIS — N184 Chronic kidney disease, stage 4 (severe): Secondary | ICD-10-CM

## 2023-02-21 DIAGNOSIS — Z794 Long term (current) use of insulin: Secondary | ICD-10-CM | POA: Diagnosis not present

## 2023-02-21 DIAGNOSIS — B9689 Other specified bacterial agents as the cause of diseases classified elsewhere: Secondary | ICD-10-CM

## 2023-02-21 MED ORDER — BENZONATATE 200 MG PO CAPS: 200.0000 mg | ORAL_CAPSULE | Freq: Two times a day (BID) | ORAL | 0 refills | Status: DC | PRN

## 2023-02-21 MED ORDER — DOXYCYCLINE HYCLATE 100 MG PO TABS: 100.0000 mg | ORAL_TABLET | Freq: Two times a day (BID) | ORAL | 0 refills | Status: AC

## 2023-02-21 MED ORDER — CETIRIZINE HCL 10 MG PO TABS
10.0000 mg | ORAL_TABLET | Freq: Every day | ORAL | 0 refills | Status: DC
Start: 2023-02-21 — End: 2023-06-01

## 2023-02-21 NOTE — Progress Notes (Signed)
Subjective  CC:  Chief Complaint  Patient presents with   Joint Swelling    Pt stated that her Rt knee and both ankles are swollen and also presents today with a cough for the past 3 weeks. COVID test came back negative. Eye exam is scheduled for OCT and she will have to schedule mammogram   Same day acute visit; PCP not available. New pt to me. Chart reviewed.   HPI: SUBJECTIVE:  Kimberly [name] is a 67 y.o. female with multiple medical problems fluting uncontrolled diabetes, hypertension, stage IV renal disease, gout, hypertension who complains of congestion, nasal blockage, post nasal drip, cough described as dry and harsh, hacking x 3 weeks. Keeping her awake at night. Using coricidin BP w/o relief. and denies sinus, high fevers, SOB, chest pain or significant GI symptoms. She denies a history of anorexia, dizziness, vomiting and wheezing.  A recent COVID test was done and was negative at home.  She reports she hasn't been sick requiring abx in awhile.  She carries a diagnosis of cough variant asthma but denies wheezing or shortness of breath at this time.  Patient does not smoke cigarettes.  She feels fatigued.  She reports thick nasal drainage.  Has had some sore throat.  No myalgias.  Chart reviewed she was recently diagnosed with gout in her knee, started on allopurinol and colchicine.  Reports recent flare in bilateral ankles that has improving.  Has noticed worsening lower extremity edema.   Assessment  1. Acute bacterial bronchitis   2. Post-nasal drainage   3. Insulin-requiring or dependent type II diabetes mellitus (HCC)   4. CKD (chronic kidney disease), stage IV (HCC)   5. Acute gout due to renal impairment involving ankle, unspecified laterality      Plan  Discussion:  Treat for bacterial bronchitis due to prolonged course and worsening symptoms. Education regarding differences between viral and bacterial infections and treatment options are discussed.  Supportive  care measures are recommended.  We discussed the use of mucolytic's, decongestants, antihistamines and antitussives as needed.  Tylenol or Advil are recommended if needed.  Refilled Zyrtec per her request  Recommend monitoring sugars while sick.  Blood pressure also was elevated.  When she is feeling better, can get this back under control.  Chronic kidney disease with lower extremity edema.  She has follow-up with nephrology soon.  May warrant diuretic.  Gout flare: Improving.  Follow up: As needed  No orders of the defined types were placed in this encounter.  Meds ordered this encounter  Medications   cetirizine (ZYRTEC) 10 MG tablet    Sig: Take 1 tablet (10 mg total) by mouth daily.    Dispense:  90 tablet    Refill:  0   benzonatate (TESSALON) 200 MG capsule    Sig: Take 1 capsule (200 mg total) by mouth 2 (two) times daily as needed for cough.    Dispense:  20 capsule    Refill:  0   doxycycline (VIBRA-TABS) 100 MG tablet    Sig: Take 1 tablet (100 mg total) by mouth 2 (two) times daily for 7 days.    Dispense:  14 tablet    Refill:  0      I reviewed the patients updated PMH, FH, and SocHx.  Social History: Patient  reports that she has never smoked. She has never used smokeless tobacco. She reports current alcohol use of about 4.0 standard drinks of alcohol per week. She reports that she does  not use drugs.  Patient Active Problem List   Diagnosis Date Noted   Claudication in peripheral vascular disease (HCC) 10/27/2022   PAD (peripheral artery disease) (HCC) 10/27/2022   Primary osteoarthritis of right knee 09/06/2022   Multiple gallstones 08/02/2022   Anemia in stage 3b chronic kidney disease (HCC) 06/09/2022   Insulin-requiring or dependent type II diabetes mellitus (HCC) 06/09/2022   Carotid bruit 03/21/2022   Ovarian mass, left 02/09/2022   Abnormal electrocardiogram (ECG) (EKG) 02/09/2022   Anemia due to zinc deficiency 07/25/2021   Hyperlipidemia LDL goal  <100 07/22/2021   Diabetic nephropathy associated with type 2 diabetes mellitus (HCC) 07/22/2021   Irritable bowel syndrome with constipation 07/22/2021   Encounter for general adult medical examination with abnormal findings 07/22/2021   CKD (chronic kidney disease), stage IV (HCC) 10/11/2019   Myalgia due to statin 02/02/2018   Cough variant asthma 10/05/2016   Cataract, left eye 10/17/2014   GERD (gastroesophageal reflux disease) 04/15/2014   Anxiety disorder 04/02/2014   Essential hypertension 05/16/2012   Overweight (BMI 25.0-29.9) 05/16/2012    Review of Systems: Cardiovascular: negative for chest pain Respiratory: negative for SOB or hemoptysis Gastrointestinal: negative for abdominal pain Genitourinary: negative for dysuria or gross hematuria Current Meds  Medication Sig   albuterol (VENTOLIN HFA) 108 (90 Base) MCG/ACT inhaler Inhale 2 puffs into the lungs every 4 (four) hours as needed. ADDITIONAL REFILLS FROM PCP   allopurinol (ZYLOPRIM) 100 MG tablet Take 1 tablet (100 mg total) by mouth daily. To prevent gout   amLODipine (NORVASC) 10 MG tablet TAKE 1 TABLET BY MOUTH DAILY   aspirin EC 81 MG tablet Take 81 mg by mouth daily. Swallow whole.   benzonatate (TESSALON) 200 MG capsule Take 1 capsule (200 mg total) by mouth 2 (two) times daily as needed for cough.   Blood Glucose Monitoring Suppl (CONTOUR NEXT MONITOR) w/Device KIT Use to test blood sugars 4 times daily. Dx: e11.9   colchicine 0.6 MG tablet Take 0.5 tablets (0.3 mg total) by mouth daily as needed (gout pain).   Continuous Glucose Receiver (FREESTYLE LIBRE 2 READER) DEVI READER DEVICE TO USE WITH SENSORS   Continuous Glucose Sensor (FREESTYLE LIBRE 2 SENSOR) MISC TEST BLOOD SUGAR THREE TIMES A DAY AS NEEDED AND CHANGE SENSOR EVERY 2 WEEKS   doxycycline (VIBRA-TABS) 100 MG tablet Take 1 tablet (100 mg total) by mouth 2 (two) times daily for 7 days.   Finerenone (KERENDIA) 20 MG TABS Take 1 tablet by mouth daily.    fluticasone (FLONASE) 50 MCG/ACT nasal spray Place 1 spray into both nostrils daily.   glucose blood (CONTOUR NEXT TEST) test strip USE TO TEST BLOOD SUGARS 4 TIMES A DAY   insulin glargine (LANTUS SOLOSTAR) 100 UNIT/ML Solostar Pen Inject 60 Units into the skin daily.   Insulin Pen Needle (BD PEN NEEDLE NANO 2ND GEN) 32G X 4 MM MISC USE AS DIRECTED ONCE DAILY   losartan (COZAAR) 50 MG tablet TAKE ONE TABLET BY MOUTH DAILY   Microlet Lancets MISC USE TO CHECK BLOOD SUGARS 4 TIMES A DAY   Pitavastatin Calcium 1 MG TABS Take 1 tablet (1 mg total) by mouth daily.   zinc gluconate 50 MG tablet TAKE 1 TABLET  BY MOUTH DAILY   [DISCONTINUED] cetirizine (ZYRTEC) 10 MG tablet Take 1 tablet (10 mg total) by mouth daily.    Objective  Vitals: BP (!) 170/64   Pulse 83   Temp 98.4 F (36.9 C)   Ht 5\' 11"  (  1.803 m)   Wt 195 lb 6.4 oz (88.6 kg)   SpO2 98%   BMI 27.25 kg/m  General: no acute distress, no respiratory distress Psych:  Alert and oriented, normal mood and affect HEENT:  Normocephalic, atraumatic, supple neck, moist mucous membranes, mild lymphadenopathy, supple neck Cardiovascular:  RRR without murmur.  Respiratory:  Good breath sounds bilaterally, CTAB with normal respiratory effort with occasional rhonchi Extremities: +2 pitting edema to knee, ankles are mobile without significant tenderness, no erythema or warmth Neurologic:   Mental status is normal.   Commons side effects, risks, benefits, and alternatives for medications and treatment plan prescribed today were discussed, and the patient expressed understanding of the given instructions. Patient is instructed to call or message via MyChart if he/she has any questions or concerns regarding our treatment plan. No barriers to understanding were identified. We discussed Red Flag symptoms and signs in detail. Patient expressed understanding regarding what to do in case of urgent or emergency type symptoms.  Medication list was  reconciled, printed and provided to the patient in AVS. Patient instructions and summary information was reviewed with the patient as documented in the AVS. This note was prepared with assistance of Dragon voice recognition software. Occasional wrong-word or sound-a-like substitutions may have occurred due to the inherent limitations of voice recognition software

## 2023-03-01 ENCOUNTER — Other Ambulatory Visit: Payer: Self-pay | Admitting: Internal Medicine

## 2023-03-01 DIAGNOSIS — R051 Acute cough: Secondary | ICD-10-CM | POA: Diagnosis not present

## 2023-03-01 DIAGNOSIS — Z23 Encounter for immunization: Secondary | ICD-10-CM | POA: Diagnosis not present

## 2023-03-01 DIAGNOSIS — Z20822 Contact with and (suspected) exposure to covid-19: Secondary | ICD-10-CM | POA: Diagnosis not present

## 2023-03-01 DIAGNOSIS — Z6825 Body mass index (BMI) 25.0-25.9, adult: Secondary | ICD-10-CM | POA: Diagnosis not present

## 2023-03-01 DIAGNOSIS — I10 Essential (primary) hypertension: Secondary | ICD-10-CM | POA: Diagnosis not present

## 2023-03-01 DIAGNOSIS — D538 Other specified nutritional anemias: Secondary | ICD-10-CM

## 2023-03-02 DIAGNOSIS — I129 Hypertensive chronic kidney disease with stage 1 through stage 4 chronic kidney disease, or unspecified chronic kidney disease: Secondary | ICD-10-CM | POA: Diagnosis not present

## 2023-03-02 DIAGNOSIS — D631 Anemia in chronic kidney disease: Secondary | ICD-10-CM | POA: Diagnosis not present

## 2023-03-02 DIAGNOSIS — N2581 Secondary hyperparathyroidism of renal origin: Secondary | ICD-10-CM | POA: Diagnosis not present

## 2023-03-02 DIAGNOSIS — N189 Chronic kidney disease, unspecified: Secondary | ICD-10-CM | POA: Diagnosis not present

## 2023-03-02 DIAGNOSIS — N184 Chronic kidney disease, stage 4 (severe): Secondary | ICD-10-CM | POA: Diagnosis not present

## 2023-03-08 ENCOUNTER — Encounter: Payer: Self-pay | Admitting: Obstetrics and Gynecology

## 2023-03-13 ENCOUNTER — Telehealth: Payer: Self-pay

## 2023-03-13 NOTE — Patient Outreach (Signed)
Incoming call received by patient regarding [facility] meals referral. Renal coordinator shared that per February referral, patient was placed on bridge program which she would receive 6 months of meals as she was still pending meals from [employer].   Coordinator will send request in hopes for clarity if she still qualifies.  Baruch Gouty Renal Coordinator [facility] [phone]

## 2023-03-21 ENCOUNTER — Telehealth: Payer: Self-pay | Admitting: Internal Medicine

## 2023-03-21 ENCOUNTER — Telehealth: Payer: Self-pay

## 2023-03-21 NOTE — Telephone Encounter (Signed)
Pt called needing some type of help pt is in pain and she feeling she is getting the run around because Dr Yetta Barre has already given the approval to get this procedure done. Pt states this is been going on for too long. Please can someone reach out to the pt. Pt stated she would see someone else.  [phone].

## 2023-03-21 NOTE — Telephone Encounter (Signed)
Please see documentation of many phone calls and visits about her symptoms. I continue to be happy to see her. She has had left flank and upper abdominal pain, which I think is unlikely to be related to her ovary. I have on multiple occasions offered her surgery. I am happy to see her in person or we can schedule a phone visit if she prefers

## 2023-03-21 NOTE — Telephone Encounter (Signed)
Pt called office upset saying she feels she is getting the run around. She is still having pelvic pain around the ovary area. She has seen her PCP x2,he ordered an ultrasound and found gallstones. She states she saw General surgery and they state the gallstones are not big enough to have taken out. She has been in pain for a long time. Her PCP states she needs to contact our office to be seen because the pain could be from her ovaries.   Pt aware Dr.Tucker is out of the office today, message sent and will call her back with advice.

## 2023-03-22 NOTE — Telephone Encounter (Signed)
Patient scheduled for a follow up appt on 9/26

## 2023-03-23 ENCOUNTER — Inpatient Hospital Stay: Payer: Medicare Other | Attending: Gynecologic Oncology | Admitting: Gynecologic Oncology

## 2023-03-23 ENCOUNTER — Encounter: Payer: Self-pay | Admitting: Gynecologic Oncology

## 2023-03-23 ENCOUNTER — Ambulatory Visit (HOSPITAL_BASED_OUTPATIENT_CLINIC_OR_DEPARTMENT_OTHER): Payer: Medicare Other | Admitting: Cardiovascular Disease

## 2023-03-23 VITALS — BP 136/64 | HR 72 | Temp 98.4°F | Resp 15 | Ht 71.0 in | Wt 193.8 lb

## 2023-03-23 DIAGNOSIS — Z803 Family history of malignant neoplasm of breast: Secondary | ICD-10-CM | POA: Insufficient documentation

## 2023-03-23 DIAGNOSIS — N184 Chronic kidney disease, stage 4 (severe): Secondary | ICD-10-CM

## 2023-03-23 DIAGNOSIS — R1010 Upper abdominal pain, unspecified: Secondary | ICD-10-CM | POA: Diagnosis not present

## 2023-03-23 DIAGNOSIS — D398 Neoplasm of uncertain behavior of other specified female genital organs: Secondary | ICD-10-CM | POA: Insufficient documentation

## 2023-03-23 DIAGNOSIS — E118 Type 2 diabetes mellitus with unspecified complications: Secondary | ICD-10-CM

## 2023-03-23 DIAGNOSIS — Z8049 Family history of malignant neoplasm of other genital organs: Secondary | ICD-10-CM | POA: Diagnosis not present

## 2023-03-23 DIAGNOSIS — R102 Pelvic and perineal pain: Secondary | ICD-10-CM | POA: Insufficient documentation

## 2023-03-23 DIAGNOSIS — Z801 Family history of malignant neoplasm of trachea, bronchus and lung: Secondary | ICD-10-CM | POA: Diagnosis not present

## 2023-03-23 DIAGNOSIS — E119 Type 2 diabetes mellitus without complications: Secondary | ICD-10-CM | POA: Diagnosis not present

## 2023-03-23 DIAGNOSIS — N9489 Other specified conditions associated with female genital organs and menstrual cycle: Secondary | ICD-10-CM

## 2023-03-23 NOTE — Patient Instructions (Signed)
We are going to hold a surgery date for May 18, 2023 for you. We will see you back in the office before to discuss surgery in detail.  Plan on having an ultrasound now. You will be contacted with the results.   Continue working on improving your blood sugar and follow up with the provider who discussed possibly starting Ozempic with you.  Please call for any needs or concerns.

## 2023-03-23 NOTE — Progress Notes (Signed)
Gynecologic Oncology Return Clinic Visit  03/23/23  Reason for Visit: follow-up   Treatment History: Patient reports left-sided intermittent flank pain for the last 2 years, is often associated with pain radiating across the mid abdomen towards her right side.  She had a visit to the emergency department in June 2022 for this pain.  CT imaging at that time on showed dependent gallstones and nonobstrucing left renal stone. Right simple appearing 5.3 cm adnexal mass. 6-12 month imaging follow-up recommended.    She followed up with [facility].   Pelvic ultrasound on 01/04/2021: Left ovary measures 4.5 x 2 x 3 cm.  There are 2 cyst, the first measuring up to 2.08 cm and the second 2.35 cm.  Left ovary noted to have a 2.1 cm echogenic nodule with hypervascularity.  There is also a 2.4 cm avascular heterogenous hypoechoic nodule.  Right ovary is measured at 8.4 x 3.5 x 4.2 cm.  There is a 6.7 x 2.7 x 2.9 cm septated fluid-filled cyst.   Pelvic ultrasound exam on 04/12/2021: Left ovary measures 3.4 x 2.2 x 2.5 cm.  Right ovary measures 2.5 x 1.7 x 1.1 cm.  Left ovary noted to have a 1.8 x 1.8 cm cyst.  Right ovary described in the report as having a 7.6 cm elongated fluid-filled cyst with septation still visualized, thought to maybe represent a hydrosalpinx.  Left ovary visualization limited due to bowel.  Hypoechoic cyst still present but stable.   Ultrasound at [facility] on 08/10/21 showed a right adnexa measures 2.8 x 1 x 1.7 cm.  Left ovary measures 4.5 x 3.4 x 2.4 cm.  In the right adnexa, there is a 7.5 x 3.3 x 5 cm elongated simple cyst without blood flow that appears to be adjacent to the ovary, possible paraovarian cyst versus hydrosalpinx.  The left ovary contains a 2.7 x 2.6 questionable solid mass versus complex cystic mass with internal echoes.  No blood flow seen to this mass.  There is also another 11 x 6 mm area that has the same appearance.  No free fluid noted.   Tumor  markers on 2/14: CA-125 was 9.7, CEA was 4.5   MRI of the pelvis on 08/29/2021 revealed thinly septated fluid signal cyst of the right ovary measuring 5.3 and 2.8.  No associated solid component or contrast-enhancement.  Given establish stability, thought to be benign.  Sigmoid diverticula noted.  MRI of the pelvis in 03/2022: Moderate right hydrosalpinx without significant change.  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  CT A/P in 06/2022 showed slight enlargement of right 6 cm adnexal cyst.  Stable nonobstructive left renal calculus noted.  No hydronephrosis or obstruction.  Cholelithiasis again noted.  Interval History: Patient voices frustration with ongoing symptoms.  She met with general surgeon in March with recommendation against cholecystectomy.  Patient now describes in addition to her upper abdominal pain having bilateral pelvic pain.  This is happening more frequently now, occurring several times a week.  When she has the pain, it lasts for longer, often lasting for minutes.  This pain can stop her in her tracks, required her to hold her abdomen.  She describes it as sharp and throbbing.  She has had to pull over while driving when it has happened.  She sometimes has radiation to her upper abdomen.  She reports normal bowel function, denies any urinary symptoms.  In terms of her diabetes, patient checks her blood sugars in  the morning and frequently at night.  Blood sugar this morning was 121.  At night, her blood sugars can be over 200.  Past Medical/Surgical History: Past Medical History:  Diagnosis Date   Arthritis    Carotid bruit 03/21/2022   Cataract    OD   Chronic kidney disease    Diabetes mellitus 2001   Hyperlipidemia Dx 2012   Lower extremity edema 01/06/2021    Past Surgical History:  Procedure Laterality Date   CATARACT EXTRACTION Left    EYE SURGERY Left    Cat Sx   VAGINAL HYSTERECTOMY     AUB (surgery in  [place]), prior to 2000    Family History  Problem Relation Age of Onset   Depression Mother    Hypertension Mother    Diabetes Mother    Heart disease Father    Heart attack Father 12   Uterine cancer Sister    Breast cancer Sister    Lung cancer Brother    Prostate cancer Neg Hx    Endometrial cancer Neg Hx    Ovarian cancer Neg Hx    Colon cancer Neg Hx    Pancreatic cancer Neg Hx     Social History   Socioeconomic History   Marital status: Divorced    Spouse name: Not on file   Number of children: Not on file   Years of education: Not on file   Highest education level: Bachelor's degree (e.g., BA, AB, BS)  Occupational History   Occupation: retired  Tobacco Use   Smoking status: Never   Smokeless tobacco: Never  Vaping Use   Vaping status: Never Used  Substance and Sexual Activity   Alcohol use: Yes    Alcohol/week: 4.0 standard drinks of alcohol    Types: 4 Glasses of wine per week    Comment: occ   Drug use: No   Sexual activity: Not Currently    Partners: Female  Other Topics Concern   Not on file  Social History Narrative   Not on file   Social Determinants of Health   Financial Resource Strain: High Risk (10/25/2022)   Overall Financial Resource Strain (CARDIA)    Difficulty of Paying Living Expenses: Hard  Food Insecurity: Food Insecurity Present (10/25/2022)   Hunger Vital Sign    Worried About Running Out of Food in the Last Year: Sometimes true    Ran Out of Food in the Last Year: Sometimes true  Transportation Needs: No Transportation Needs (10/25/2022)   PRAPARE - Administrator, Civil Service (Medical): No    Lack of Transportation (Non-Medical): No  Physical Activity: Insufficiently Active (10/25/2022)   Exercise Vital Sign    Days of Exercise per Week: 3 days    Minutes of Exercise per Session: 30 min  Stress: Stress Concern Present (10/25/2022)   [employer] of Occupational Health - Occupational Stress Questionnaire     Feeling of Stress : To some extent  Social Connections: Moderately Integrated (10/25/2022)   Social Connection and Isolation Panel [NHANES]    Frequency of Communication with Friends and Family: More than three times a week    Frequency of Social Gatherings with Friends and Family: Twice a week    Attends Religious Services: More than 4 times per year    Active Member of [employer] or Organizations: Yes    Attends Engineer, structural: More than 4 times per year    Marital Status: Divorced    Current Medications:  Current  Outpatient Medications:    albuterol (VENTOLIN HFA) 108 (90 Base) MCG/ACT inhaler, Inhale 2 puffs into the lungs every 4 (four) hours as needed. ADDITIONAL REFILLS FROM PCP, Disp: 1 each, Rfl: 0   allopurinol (ZYLOPRIM) 100 MG tablet, Take 1 tablet (100 mg total) by mouth daily. To prevent gout, Disp: 90 tablet, Rfl: 1   amLODipine (NORVASC) 10 MG tablet, TAKE 1 TABLET BY MOUTH DAILY, Disp: 90 tablet, Rfl: 1   aspirin EC 81 MG tablet, Take 81 mg by mouth daily. Swallow whole., Disp: , Rfl:    benzonatate (TESSALON) 200 MG capsule, Take 1 capsule (200 mg total) by mouth 2 (two) times daily as needed for cough., Disp: 20 capsule, Rfl: 0   Blood Glucose Monitoring Suppl (CONTOUR NEXT MONITOR) w/Device KIT, Use to test blood sugars 4 times daily. Dx: e11.9, Disp: 1 kit, Rfl: 1   cetirizine (ZYRTEC) 10 MG tablet, Take 1 tablet (10 mg total) by mouth daily., Disp: 90 tablet, Rfl: 0   colchicine 0.6 MG tablet, Take 0.5 tablets (0.3 mg total) by mouth daily as needed (gout pain)., Disp: 45 tablet, Rfl: 3   Continuous Glucose Receiver (FREESTYLE LIBRE 2 READER) DEVI, READER DEVICE TO USE WITH SENSORS, Disp: 1 each, Rfl: 3   Continuous Glucose Sensor (FREESTYLE LIBRE 2 SENSOR) MISC, TEST BLOOD SUGAR THREE TIMES A DAY AS NEEDED AND CHANGE SENSOR EVERY 2 WEEKS, Disp: 2 each, Rfl: 5   Finerenone (KERENDIA) 20 MG TABS, Take 1 tablet by mouth daily., Disp: 90 tablet, Rfl: 1    fluticasone (FLONASE) 50 MCG/ACT nasal spray, Place 1 spray into both nostrils daily., Disp: 16 g, Rfl: 0   glucose blood (CONTOUR NEXT TEST) test strip, USE TO TEST BLOOD SUGARS 4 TIMES A DAY, Disp: 400 strip, Rfl: 2   insulin glargine (LANTUS SOLOSTAR) 100 UNIT/ML Solostar Pen, Inject 60 Units into the skin daily., Disp: 54 mL, Rfl: 1   Insulin Pen Needle (BD PEN NEEDLE NANO 2ND GEN) 32G X 4 MM MISC, USE AS DIRECTED ONCE DAILY, Disp: 100 each, Rfl: 1   losartan (COZAAR) 50 MG tablet, TAKE ONE TABLET BY MOUTH DAILY, Disp: 90 tablet, Rfl: 3   Microlet Lancets MISC, USE TO CHECK BLOOD SUGARS 4 TIMES A DAY, Disp: 400 each, Rfl: 12   Pitavastatin Calcium 1 MG TABS, Take 1 tablet (1 mg total) by mouth daily., Disp: 90 tablet, Rfl: 1   zinc gluconate 50 MG tablet, TAKE 1 TABLET BY MOUTH DAILY, Disp: 90 tablet, Rfl: 0  Review of Systems: Denies appetite changes, fevers, chills, fatigue, unexplained weight changes. Denies hearing loss, neck lumps or masses, mouth sores, ringing in ears or voice changes. Denies cough or wheezing.  Denies shortness of breath. Denies chest pain or palpitations. Denies leg swelling. Denies abdominal distention, pain, blood in stools, constipation, diarrhea, nausea, vomiting, or early satiety. Denies pain with intercourse, dysuria, frequency, hematuria or incontinence. Denies hot flashes, pelvic pain, vaginal bleeding or vaginal discharge.   Denies joint pain, back pain or muscle pain/cramps. Denies itching, rash, or wounds. Denies dizziness, headaches, numbness or seizures. Denies swollen lymph nodes or glands, denies easy bruising or bleeding. Denies anxiety, depression, confusion, or decreased concentration.  Physical Exam: BP 136/64 (BP Location: Right Arm, Patient Position: Sitting)   Pulse 72   Temp 98.4 F (36.9 C) (Oral)   Resp 15   Ht 5\' 11"  (1.803 m)   Wt 193 lb 12.8 oz (87.9 kg)   SpO2 100%   BMI 27.03  kg/m  General: Alert, oriented, no acute  distress. HEENT: Normocephalic, atraumatic, sclera anicteric. Chest: Clear to auscultation bilaterally.  No wheezes or rhonchi. Cardiovascular: Regular rate and rhythm, no murmurs. Abdomen: soft, nontender.  Normoactive bowel sounds.  No masses or hepatosplenomegaly appreciated.  Extremities: Grossly normal range of motion.  Warm, well perfused.  No edema bilaterally. Skin: No rashes or lesions noted. Lymphatics: No cervical, supraclavicular, or inguinal adenopathy. GU: Normal appearing external genitalia without erythema, excoriation, or lesions.  Speculum exam reveals mildly atrophic vaginal mucosa, no lesions.  Bimanual exam reveals fullness within the pelvis on the right, smooth.    Laboratory & Radiologic Studies: Hgb A1c in 12/2022: 8.8%  Assessment & Plan: Kimberly [name] is a 67 y.o. woman with bilateral adnexal masses.   The patient comes in today for follow-up of her bilateral adnexal masses.  I have been seeing her for the last year and a half.  Today, she endorses pelvic pain, which is new from my prior discussions with her.  Previously, we had discussed my concern that surgery to remove her adnexa would be unlikely to resolve her left flank and upper abdominal pain.  Additionally, when we first met, the patient's preference was to avoid surgery unless needed.  Based on her pelvic symptoms now, I think it is reasonable to proceed with therapeutic surgery and perform bilateral salpingo-oophorectomy.  Discussed that surgery may help alleviate or at least improve her pelvic symptoms although will unlikely change her upper abdominal symptoms.  The patient voices concerned about cancer.  We discussed again the stable size and appearance of bilateral masses.  Ultimately, surgical treatment will also be diagnostic to assure no borderline or malignant process.  Patient's last hemoglobin A1c was almost 9%.  We discussed the perioperative risks related to hyperglycemia.  Ideally, for the surgery  we have planned, I would like her hemoglobin A1c under 8%.  I have suggested that we spend the next 6-8 weeks working on good glycemic control.  It sounds like there has been some discussion about having the patient's start Ozempic.  I encouraged her to reach out to her primary care provider today to discuss this further.  We will tentatively plan on surgery in late November with a preoperative visit 1-2 weeks before to check-in regarding glycemic control.  Given plan to wait a couple of months to improve glycemic control, I recommended that we get repeat imaging to assure no significant change in size of either of her adnexal masses.  Pelvic ultrasound was ordered and scheduled today.  I will also reach out to Dr. Fredricka Bonine about cholecystectomy at the time of BSO.  The patient tells me that she has follow-up with Dr. Fredricka Bonine in early October.  32 minutes of total time was spent for this patient encounter, including preparation, face-to-face counseling with the patient and coordination of care, and documentation of the encounter.  Eugene Garnet, MD  Division of Gynecologic Oncology  Department of Obstetrics and Gynecology  [facility]

## 2023-03-24 ENCOUNTER — Ambulatory Visit (HOSPITAL_COMMUNITY): Payer: Medicare Other

## 2023-03-27 ENCOUNTER — Telehealth: Payer: Self-pay | Admitting: *Deleted

## 2023-03-27 NOTE — Telephone Encounter (Signed)
Per Dr Berline Lopes fax surgical optimization form to the patient's PCP office

## 2023-03-29 ENCOUNTER — Other Ambulatory Visit (HOSPITAL_COMMUNITY): Payer: Medicare Other

## 2023-03-30 ENCOUNTER — Other Ambulatory Visit (HOSPITAL_COMMUNITY): Payer: Medicare Other

## 2023-03-31 ENCOUNTER — Ambulatory Visit (HOSPITAL_COMMUNITY)
Admission: RE | Admit: 2023-03-31 | Discharge: 2023-03-31 | Disposition: A | Discharge status: Home / Self Care | Payer: Medicare Other | Source: Ambulatory Visit | Attending: Gynecologic Oncology | Admitting: Gynecologic Oncology

## 2023-03-31 DIAGNOSIS — N838 Other noninflammatory disorders of ovary, fallopian tube and broad ligament: Secondary | ICD-10-CM | POA: Diagnosis not present

## 2023-03-31 DIAGNOSIS — Z90710 Acquired absence of both cervix and uterus: Secondary | ICD-10-CM | POA: Diagnosis not present

## 2023-03-31 DIAGNOSIS — N9489 Other specified conditions associated with female genital organs and menstrual cycle: Secondary | ICD-10-CM | POA: Diagnosis not present

## 2023-04-07 ENCOUNTER — Telehealth: Payer: Self-pay | Admitting: Gynecologic Oncology

## 2023-04-07 NOTE — Telephone Encounter (Signed)
I called the patient to discuss recent ultrasound.  Tubular cystic mass in the right adnexa continues to look most consistent with a hydrosalpinx.  Left ovary is not well-visualized today.  We discussed that this can be because of the bowel or gas patterns.  We could get an MRI to better characterize that adnexa, but I suspect that it will be similar to her last CT or MRI imaging.  I reviewed our discussion at her recent exam and that because of the significant change in her pain including bilateral pelvic pain that we had opted to move forward with scheduling definitive surgery.  Today, she tells me that the pain was especially bad the day she came to see me but has been overall better.  She seems somewhat undecided about whether she would like to proceed with surgery.  I reviewed the benefit of proceeding with surgery in terms of definitively treating any pain that is related to her bilateral masses.  My concern for malignant process continues to be quite low.  I have asked her to think about things and to let me know in the next few weeks.  If she continues to have decrease of her pain, it would be reasonable to postpone or hold off on surgery at this time.  Patient voiced understanding.  Eugene Garnet MD Gynecologic Oncology

## 2023-04-13 ENCOUNTER — Ambulatory Visit (HOSPITAL_BASED_OUTPATIENT_CLINIC_OR_DEPARTMENT_OTHER)
Admission: RE | Admit: 2023-04-13 | Discharge: 2023-04-13 | Disposition: A | Discharge status: Home / Self Care | Payer: Medicare Other | Source: Ambulatory Visit | Attending: Internal Medicine | Admitting: Internal Medicine

## 2023-04-13 DIAGNOSIS — R92333 Mammographic heterogeneous density, bilateral breasts: Secondary | ICD-10-CM | POA: Insufficient documentation

## 2023-04-13 DIAGNOSIS — Z1231 Encounter for screening mammogram for malignant neoplasm of breast: Secondary | ICD-10-CM | POA: Diagnosis not present

## 2023-04-20 NOTE — Telephone Encounter (Signed)
Refax surgical clearance form to [facility] office

## 2023-04-21 NOTE — Telephone Encounter (Signed)
Received PCP clearance, PCP recommended cardiology clearance. Clearance sent to cardiology office

## 2023-04-25 ENCOUNTER — Telehealth: Payer: Self-pay

## 2023-04-25 NOTE — Telephone Encounter (Signed)
Pre-operative Risk Assessment    Patient Name: Kimberly [name]  DOB: 1956-04-29 MRN: [id number]       Request for Surgical Clearance    DATE OF LAST VISIT: 09/13/22 DR Rice DATE OF NEXT VISIT: 08/08/23 DR   Procedure:  robotic assisted bilateral salpingo-oophorectomy  Date of Surgery:  Clearance 05/08/23                                 Surgeon:  Eugene Garnet Surgeon's Group or Practice Name:  [facility] GYNECOLOGY ONCOLOGY Phone number:  [phone] Fax number:  [phone]   Type of Clearance Requested:   - Pharmacy:  Hold Aspirin SURGEON OKAY TO STAY ON ASPIRIN UNLESS CARDIOLOGIST FEELS IT NEEDS TO BE HELD   Type of Anesthesia:  General    Additional requests/questions:    Signed, Evelene Roussin Saiquan Hands   04/25/2023, 12:30 PM

## 2023-04-25 NOTE — Telephone Encounter (Signed)
Received cardiology clearance

## 2023-04-25 NOTE — Telephone Encounter (Signed)
Patient Name: Kimberly [name]  DOB: [birth year] MRN: [id number]  Primary Cardiologist: None  Chart reviewed as part of pre-operative protocol coverage.   Regarding ASA therapy, we recommend continuation of ASA throughout the perioperative period.  However, if the surgeon feels that cessation of ASA is required in the perioperative period, it may be stopped 5-7 days prior to surgery with a plan to resume it as soon as felt to be feasible from a surgical standpoint in the post-operative period.    Napoleon Form, Leodis Rains, NP 04/25/2023, 12:50 PM

## 2023-04-27 ENCOUNTER — Telehealth: Payer: Self-pay | Admitting: *Deleted

## 2023-04-27 NOTE — Telephone Encounter (Signed)
Spoke with patient who called the office stating she doesn't think she wants to have her surgery that is scheduled for November 21st with Dr. Pricilla Holm. Advised patient to keep her scheduled appt. With Dr. Pricilla Holm on November 14th. At 0830 to discuss her concerns. Pt verbalized understanding and agreed to keep appt. On November 14 th.

## 2023-04-27 NOTE — Telephone Encounter (Signed)
Spoke with Ms. Burrous and changed her appt. With Dr. Pricilla Holm on Thursday, November 14 th at 0830 to a phone visit. Pt agreed and had no further concerns at this time.

## 2023-04-27 NOTE — Telephone Encounter (Signed)
If the patient does not think that she wants to have surgery, I would recommend that we change this visit to a phone visit so she does not have to come in and so that it is less expensive visit for her.

## 2023-04-28 ENCOUNTER — Telehealth: Payer: Self-pay | Admitting: *Deleted

## 2023-04-28 NOTE — Telephone Encounter (Signed)
Spoke with [name] and pt states she wants to cancel her surgery this month and wait until after the first of the year. Pt's phone visit was rescheduled for January 8 th at 4 pm. Pt agreed to date and time of phone visit and had no further concerns at this time.

## 2023-05-10 DIAGNOSIS — H524 Presbyopia: Secondary | ICD-10-CM | POA: Diagnosis not present

## 2023-05-10 DIAGNOSIS — H25811 Combined forms of age-related cataract, right eye: Secondary | ICD-10-CM | POA: Diagnosis not present

## 2023-05-10 DIAGNOSIS — H40011 Open angle with borderline findings, low risk, right eye: Secondary | ICD-10-CM | POA: Diagnosis not present

## 2023-05-10 DIAGNOSIS — E119 Type 2 diabetes mellitus without complications: Secondary | ICD-10-CM | POA: Diagnosis not present

## 2023-05-10 LAB — HM DIABETES EYE EXAM

## 2023-05-11 ENCOUNTER — Telehealth: Payer: Medicare Other | Admitting: Gynecologic Oncology

## 2023-05-11 ENCOUNTER — Encounter: Payer: Medicare Other | Admitting: Gynecologic Oncology

## 2023-05-11 DIAGNOSIS — K080 Exfoliation of teeth due to systemic causes: Secondary | ICD-10-CM | POA: Diagnosis not present

## 2023-05-16 DIAGNOSIS — H524 Presbyopia: Secondary | ICD-10-CM | POA: Diagnosis not present

## 2023-05-18 ENCOUNTER — Other Ambulatory Visit (HOSPITAL_BASED_OUTPATIENT_CLINIC_OR_DEPARTMENT_OTHER): Payer: Self-pay | Admitting: Cardiovascular Disease

## 2023-05-18 ENCOUNTER — Ambulatory Visit (HOSPITAL_COMMUNITY): Admit: 2023-05-18 | Payer: Medicare Other | Admitting: Gynecologic Oncology

## 2023-05-18 ENCOUNTER — Other Ambulatory Visit: Payer: Self-pay | Admitting: Internal Medicine

## 2023-05-18 DIAGNOSIS — I10 Essential (primary) hypertension: Secondary | ICD-10-CM

## 2023-05-18 DIAGNOSIS — N838 Other noninflammatory disorders of ovary, fallopian tube and broad ligament: Secondary | ICD-10-CM

## 2023-05-18 SURGERY — SALPINGO-OOPHORECTOMY, BILATERAL, ROBOT-ASSISTED
Anesthesia: General | Laterality: Bilateral

## 2023-05-26 ENCOUNTER — Other Ambulatory Visit: Payer: Self-pay | Admitting: Internal Medicine

## 2023-05-26 DIAGNOSIS — D538 Other specified nutritional anemias: Secondary | ICD-10-CM

## 2023-05-31 ENCOUNTER — Other Ambulatory Visit: Payer: Self-pay | Admitting: Family Medicine

## 2023-05-31 DIAGNOSIS — R0982 Postnasal drip: Secondary | ICD-10-CM

## 2023-06-27 ENCOUNTER — Telehealth: Payer: Self-pay

## 2023-06-27 NOTE — Telephone Encounter (Signed)
 Thank you for the update. Please let her know to call if something changes before then.

## 2023-06-27 NOTE — Telephone Encounter (Signed)
 Kimberly [name] called office to cancel her phone visit with Dr.Tucker on 07/05/23. She states she wants to try changing her diet and taking herbs (she has read a lot about this) to get pain with the cysts under control. Pain has decreased since last visit but still there.  She will call back in June to set up an appointment.

## 2023-07-05 ENCOUNTER — Telehealth: Payer: Medicare Other | Admitting: Gynecologic Oncology

## 2023-07-20 ENCOUNTER — Other Ambulatory Visit: Payer: Self-pay

## 2023-07-20 MED ORDER — BD PEN NEEDLE NANO 2ND GEN 32G X 4 MM MISC: 0 refills | Status: DC

## 2023-07-21 ENCOUNTER — Telehealth: Payer: Self-pay

## 2023-07-21 NOTE — Progress Notes (Signed)
Care Guide Pharmacy Note  07/21/2023 Name: Zondra Lawlor MRN: [id number] DOB: 1956/02/22  Referred By: Etta Grandchild, MD Reason for referral: Care Coordination (TNM Diabetes. )   Erica Richwine is a 67 y.o. year old female who is a primary care patient of Etta Grandchild, MD.  Bronwen Betters was referred to the pharmacist for assistance related to: DMII  An unsuccessful telephone outreach was attempted today to contact the patient who was referred to the pharmacy team for assistance with diabetes. Additional attempts will be made to contact the patient.  [facility]  [facility], [facility] Health Care Management Assistant Direct Dial: [phone]  Fax: [phone]

## 2023-07-24 ENCOUNTER — Telehealth: Payer: Self-pay

## 2023-07-24 NOTE — Progress Notes (Signed)
Care Guide Pharmacy Note  07/24/2023 Name: Kimberly [name] MRN: [id number] DOB: [birth year]  Referred By: Etta Grandchild, MD Reason for referral: Care Coordination (TNM Diabetes. )   Kimberly [name] is a 68 y.o. year old female who is a primary care patient of Etta Grandchild, MD.  Kimberly [name] was referred to the pharmacist for assistance related to: DMII  Successful contact was made with the patient to discuss pharmacy services.  Patient declines engagement at this time. Contact information was provided to the patient should they wish to reach out for assistance at a later time.  [facility]  [facility], [facility] Health Care Management Assistant Direct Dial: [phone]  Fax: [phone]

## 2023-08-01 ENCOUNTER — Other Ambulatory Visit: Payer: Self-pay | Admitting: Family Medicine

## 2023-08-01 ENCOUNTER — Ambulatory Visit (INDEPENDENT_AMBULATORY_CARE_PROVIDER_SITE_OTHER): Payer: Medicare Other | Admitting: Internal Medicine

## 2023-08-01 VITALS — BP 162/66 | HR 82 | Temp 98.3°F | Resp 16 | Ht 71.0 in | Wt 210.2 lb

## 2023-08-01 DIAGNOSIS — N1832 Chronic kidney disease, stage 3b: Secondary | ICD-10-CM | POA: Diagnosis not present

## 2023-08-01 DIAGNOSIS — E785 Hyperlipidemia, unspecified: Secondary | ICD-10-CM

## 2023-08-01 DIAGNOSIS — R7989 Other specified abnormal findings of blood chemistry: Secondary | ICD-10-CM

## 2023-08-01 DIAGNOSIS — I10 Essential (primary) hypertension: Secondary | ICD-10-CM

## 2023-08-01 DIAGNOSIS — K5904 Chronic idiopathic constipation: Secondary | ICD-10-CM

## 2023-08-01 DIAGNOSIS — Z794 Long term (current) use of insulin: Secondary | ICD-10-CM | POA: Diagnosis not present

## 2023-08-01 DIAGNOSIS — N184 Chronic kidney disease, stage 4 (severe): Secondary | ICD-10-CM

## 2023-08-01 DIAGNOSIS — D538 Other specified nutritional anemias: Secondary | ICD-10-CM

## 2023-08-01 DIAGNOSIS — R600 Localized edema: Secondary | ICD-10-CM | POA: Diagnosis not present

## 2023-08-01 DIAGNOSIS — E119 Type 2 diabetes mellitus without complications: Secondary | ICD-10-CM | POA: Diagnosis not present

## 2023-08-01 DIAGNOSIS — I739 Peripheral vascular disease, unspecified: Secondary | ICD-10-CM

## 2023-08-01 DIAGNOSIS — D631 Anemia in chronic kidney disease: Secondary | ICD-10-CM | POA: Diagnosis not present

## 2023-08-01 DIAGNOSIS — T466X5A Adverse effect of antihyperlipidemic and antiarteriosclerotic drugs, initial encounter: Secondary | ICD-10-CM

## 2023-08-01 LAB — TROPONIN I (HIGH SENSITIVITY): High Sens Troponin I: 8 ng/L (ref 2–17)

## 2023-08-01 LAB — CBC WITH DIFFERENTIAL/PLATELET
Basophils Absolute: 0 10*3/uL (ref 0.0–0.1)
Basophils Relative: 0.6 % (ref 0.0–3.0)
Eosinophils Absolute: 0.4 10*3/uL (ref 0.0–0.7)
Eosinophils Relative: 5.5 % — ABNORMAL HIGH (ref 0.0–5.0)
HCT: 33.2 % — ABNORMAL LOW (ref 36.0–46.0)
Hemoglobin: 10.8 g/dL — ABNORMAL LOW (ref 12.0–15.0)
Lymphocytes Relative: 22.4 % (ref 12.0–46.0)
Lymphs Abs: 1.5 10*3/uL (ref 0.7–4.0)
MCHC: 32.5 g/dL (ref 30.0–36.0)
MCV: 89.2 fL (ref 78.0–100.0)
Monocytes Absolute: 0.6 10*3/uL (ref 0.1–1.0)
Monocytes Relative: 8.5 % (ref 3.0–12.0)
Neutro Abs: 4.1 10*3/uL (ref 1.4–7.7)
Neutrophils Relative %: 63 % (ref 43.0–77.0)
Platelets: 181 10*3/uL (ref 150.0–400.0)
RBC: 3.72 Mil/uL — ABNORMAL LOW (ref 3.87–5.11)
RDW: 13.9 % (ref 11.5–15.5)
WBC: 6.5 10*3/uL (ref 4.0–10.5)

## 2023-08-01 LAB — BASIC METABOLIC PANEL
BUN: 43 mg/dL — ABNORMAL HIGH (ref 6–23)
CO2: 22 meq/L (ref 19–32)
Calcium: 8.8 mg/dL (ref 8.4–10.5)
Chloride: 107 meq/L (ref 96–112)
Creatinine, Ser: 2.18 mg/dL — ABNORMAL HIGH (ref 0.40–1.20)
GFR: 22.51 mL/min — ABNORMAL LOW (ref >60.00)
Glucose, Bld: 191 mg/dL — ABNORMAL HIGH (ref 70–99)
Potassium: 4.5 meq/L (ref 3.5–5.1)
Sodium: 136 meq/L (ref 135–145)

## 2023-08-01 LAB — HEPATIC FUNCTION PANEL
ALT: 17 U/L (ref 0–35)
AST: 21 U/L (ref 0–37)
Albumin: 4.1 g/dL (ref 3.5–5.2)
Alkaline Phosphatase: 61 U/L (ref 39–117)
Bilirubin, Direct: 0.1 mg/dL (ref 0.0–0.3)
Total Bilirubin: 0.3 mg/dL (ref 0.2–1.2)
Total Protein: 6.6 g/dL (ref 6.0–8.3)

## 2023-08-01 LAB — URIC ACID: Uric Acid, Serum: 7.1 mg/dL — ABNORMAL HIGH (ref 2.4–7.0)

## 2023-08-01 LAB — D-DIMER, QUANTITATIVE: D-Dimer, Quant: 1.42 ug{FEU}/mL — ABNORMAL HIGH (ref <0.50)

## 2023-08-01 LAB — MAGNESIUM: Magnesium: 1.7 mg/dL (ref 1.5–2.5)

## 2023-08-01 LAB — TSH: TSH: 1.78 u[IU]/mL (ref 0.35–5.50)

## 2023-08-01 LAB — BRAIN NATRIURETIC PEPTIDE: Pro B Natriuretic peptide (BNP): 19 pg/mL (ref 0.0–100.0)

## 2023-08-01 NOTE — Patient Instructions (Signed)
Edema  Edema is an abnormal buildup of fluids in the body tissues and under the skin. Swelling of the legs, feet, and ankles is a common symptom that becomes more likely as you get older. Swelling is also common in looser tissues, such as around the eyes. Pressing on the area may make a temporary dent in your skin (pitting edema). This fluid may also accumulate in your lungs (pulmonary edema). There are many possible causes of edema. Eating too much salt (sodium) and being on your feet or sitting for a long time can cause edema in your legs, feet, and ankles. Common causes of edema include: Certain medical conditions, such as heart failure, liver or kidney disease, and cancer. Weak leg blood vessels. An injury. Pregnancy. Medicines. Being obese. Low protein levels in the blood. Hot weather may make edema worse. Edema is usually painless. Your skin may look swollen or shiny. Follow these instructions at home: Medicines Take over-the-counter and prescription medicines only as told by your health care provider. Your health care provider may prescribe a medicine to help your body get rid of extra water (diuretic). Take this medicine if you are told to take it. Eating and drinking Eat a low-salt (low-sodium) diet to reduce fluid as told by your health care provider. Sometimes, eating less salt may reduce swelling. Depending on the cause of your swelling, you may need to limit how much fluid you drink (fluid restriction). General instructions Raise (elevate) the injured area above the level of your heart while you are sitting or lying down. Do not sit still or stand for long periods of time. Do not wear tight clothing. Do not wear garters on your upper legs. Exercise your legs to get your circulation going. This helps to move the fluid back into your blood vessels, and it may help the swelling go down. Wear compression stockings as told by your health care provider. These stockings help to prevent  blood clots and reduce swelling in your legs. It is important that these are the correct size. These stockings should be prescribed by your health care provider to prevent possible injuries. If elastic bandages or wraps are recommended, use them as told by your health care provider. Contact a health care provider if: Your edema does not get better with treatment. You have heart, liver, or kidney disease and have symptoms of edema. You have sudden and unexplained weight gain. Get help right away if: You develop shortness of breath or chest pain. You cannot breathe when you lie down. You develop pain, redness, or warmth in the swollen areas. You have heart, liver, or kidney disease and suddenly get edema. You have a fever and your symptoms suddenly get worse. These symptoms may be an emergency. Get help right away. Call 911. Do not wait to see if the symptoms will go away. Do not drive yourself to the hospital. Summary Edema is an abnormal buildup of fluids in the body tissues and under the skin. Eating too much salt (sodium)and being on your feet or sitting for a long time can cause edema in your legs, feet, and ankles. Raise (elevate) the injured area above the level of your heart while you are sitting or lying down. Follow your health care provider's instructions about diet and how much fluid you can drink. This information is not intended to replace advice given to you by your health care provider. Make sure you discuss any questions you have with your health care provider. Document Revised: 02/15/2021 Document  Reviewed: 02/15/2021 Elsevier Patient Education  2024 [employer].

## 2023-08-01 NOTE — Progress Notes (Signed)
 Subjective:  Patient ID: Kimberly [name], female    DOB: 1956-05-19  Age: 68 y.o. MRN: [id number]  CC: Hypertension and Diabetes   HPI [name] presents for f/up ---  Discussed the use of AI scribe software for clinical note transcription with the patient, who gave verbal consent to proceed.  History of Present Illness   Kimberly [name] is a 68 year old female who presents with leg swelling and constipation.  She has experienced leg swelling for the past year, characterized by significant pitting edema. Recently, she noticed pain under the bottom of her buttocks after walking to the clinic. She is concerned about the potential causes of this swelling.  She reports ongoing constipation, feeling 'backed up' despite having a bowel movement this morning. This issue has persisted since she last mentioned it to her provider, and the previously prescribed medication was ineffective. She has been using milk of magnesia and finds that consuming red pepper, garlic, and onions helps alleviate her symptoms.  She has had a mild cough and runny nose for the past two days, which she describes as less severe than a previous episode. No wheezing is reported.  She is currently taking medication for gout prescribed by another doctor. She questions the necessity of continuing albuterol  and nasal spray, noting the absence of wheezing but recent nasal symptoms.  No chest pain, shortness of breath, dizziness, or lightheadedness.       Outpatient Medications Prior to Visit  Medication Sig Dispense Refill   albuterol  (VENTOLIN  HFA) 108 (90 Base) MCG/ACT inhaler Inhale 2 puffs into the lungs every 4 (four) hours as needed. ADDITIONAL REFILLS FROM PCP 1 each 0   Blood Glucose Monitoring Suppl (CONTOUR NEXT MONITOR) w/Device KIT Use to test blood sugars 4 times daily. Dx: e11.9 1 kit 1   cetirizine  (ZYRTEC ) 10 MG tablet TAKE 1 TABLET BY MOUTH DAILY 90 tablet 0   colchicine  0.6 MG tablet Take 0.5  tablets (0.3 mg total) by mouth daily as needed (gout pain). 45 tablet 3   fluticasone  (FLONASE ) 50 MCG/ACT nasal spray Place 1 spray into both nostrils daily. 16 g 0   glucose blood (CONTOUR NEXT TEST) test strip USE TO TEST BLOOD SUGARS 4 TIMES A DAY 400 strip 2   Insulin  Pen Needle (BD PEN NEEDLE NANO 2ND GEN) 32G X 4 MM MISC use as directed 30 each 0   losartan  (COZAAR ) 50 MG tablet Take 1 tablet (50 mg total) by mouth daily. Please keep scheduled appointment for future refills. Thank you. 90 tablet 0   Microlet Lancets MISC USE TO CHECK BLOOD SUGARS 4 TIMES A DAY 400 each 12   zinc  gluconate 50 MG tablet TAKE ONE TABLET BY MOUTH DAILY 90 tablet 0   allopurinol  (ZYLOPRIM ) 100 MG tablet Take 1 tablet (100 mg total) by mouth daily. To prevent gout 90 tablet 1   amLODipine  (NORVASC ) 10 MG tablet TAKE 1 TABLET BY MOUTH DAILY 90 tablet 0   aspirin  EC 81 MG tablet Take 81 mg by mouth daily. Swallow whole.     benzonatate  (TESSALON ) 200 MG capsule Take 1 capsule (200 mg total) by mouth 2 (two) times daily as needed for cough. 20 capsule 0   Continuous Glucose Receiver (FREESTYLE LIBRE 2 READER) DEVI READER DEVICE TO USE WITH SENSORS 1 each 3   Continuous Glucose Sensor (FREESTYLE LIBRE 2 SENSOR) MISC TEST BLOOD SUGAR THREE TIMES A DAY AS NEEDED AND CHANGE SENSOR EVERY 2 WEEKS 2 each  5   Finerenone  (KERENDIA ) 20 MG TABS Take 1 tablet by mouth daily. 90 tablet 1   insulin  glargine (LANTUS  SOLOSTAR) 100 UNIT/ML Solostar Pen Inject 60 Units into the skin daily. 54 mL 1   Pitavastatin  Calcium  1 MG TABS Take 1 tablet (1 mg total) by mouth daily. 90 tablet 1   No facility-administered medications prior to visit.    ROS Review of Systems  Constitutional:  Positive for unexpected weight change (wt gain). Negative for appetite change, chills, diaphoresis and fatigue.  HENT: Negative.    Eyes:  Negative for visual disturbance.  Respiratory:  Positive for cough. Negative for choking, chest tightness,  shortness of breath, wheezing and stridor.   Cardiovascular:  Positive for leg swelling. Negative for chest pain and palpitations.  Gastrointestinal:  Positive for constipation. Negative for abdominal pain, diarrhea, nausea and vomiting.  Genitourinary: Negative.  Negative for difficulty urinating and flank pain.  Musculoskeletal:  Positive for arthralgias. Negative for myalgias.  Skin: Negative.   Neurological: Negative.  Negative for dizziness, weakness and light-headedness.  Psychiatric/Behavioral:  Positive for confusion and decreased concentration. Negative for behavioral problems and suicidal ideas. The patient is not nervous/anxious.     Objective:  BP (!) 162/66 (BP Location: Left Arm, Patient Position: Sitting, Cuff Size: Normal)   Pulse 82   Temp 98.3 F (36.8 C) (Oral)   Resp 16   Ht 5' 11 (1.803 m)   Wt 210 lb 3.2 oz (95.3 kg)   SpO2 98%   BMI 29.32 kg/m   BP Readings from Last 3 Encounters:  08/01/23 (!) 162/66  03/23/23 136/64  02/21/23 (!) 170/64    Wt Readings from Last 3 Encounters:  08/01/23 210 lb 3.2 oz (95.3 kg)  03/23/23 193 lb 12.8 oz (87.9 kg)  02/21/23 195 lb 6.4 oz (88.6 kg)    Physical Exam Vitals reviewed.  Constitutional:      General: She is not in acute distress.    Appearance: She is not ill-appearing, toxic-appearing or diaphoretic.  HENT:     Nose: Nose normal.     Mouth/Throat:     Mouth: Mucous membranes are moist.  Eyes:     General: No scleral icterus.    Conjunctiva/sclera: Conjunctivae normal.  Cardiovascular:     Rate and Rhythm: Normal rate.     Heart sounds: Murmur heard.     Systolic murmur is present with a grade of 2/6.     No friction rub. No gallop.     Comments: EKG- NSR, 78 bpm LVH Antero/septal infarct pattern is old No acute ST/T wave changes Unchanged  Pulmonary:     Effort: Pulmonary effort is normal.     Breath sounds: No stridor. No wheezing, rhonchi or rales.  Abdominal:     General: Abdomen is  protuberant. Bowel sounds are normal. There is no distension.     Palpations: There is no hepatomegaly, splenomegaly or mass.     Tenderness: There is no abdominal tenderness. There is no guarding.  Musculoskeletal:     Cervical back: Neck supple.     Right lower leg: 2+ Pitting Edema present.     Left lower leg: 2+ Pitting Edema present.  Lymphadenopathy:     Cervical: No cervical adenopathy.  Skin:    General: Skin is warm.     Coloration: Skin is not pale.  Neurological:     General: No focal deficit present.     Mental Status: She is alert. Mental status is at  baseline.  Psychiatric:        Mood and Affect: Mood normal.     Lab Results  Component Value Date   WBC 6.5 08/01/2023   HGB 10.8 (L) 08/01/2023   HCT 33.2 (L) 08/01/2023   PLT 181.0 08/01/2023   GLUCOSE 191 (H) 08/01/2023   CHOL 275 (H) 10/27/2022   TRIG 249.0 (H) 10/27/2022   HDL 44.40 10/27/2022   LDLDIRECT 162.0 10/27/2022   LDLCALC 138 (H) 01/28/2021   ALT 17 08/01/2023   AST 21 08/01/2023   NA 136 08/01/2023   K 4.5 08/01/2023   CL 107 08/01/2023   CREATININE 2.18 (H) 08/01/2023   BUN 43 (H) 08/01/2023   CO2 22 08/01/2023   TSH 1.78 08/01/2023   HGBA1C 9.0 (H) 08/01/2023   MICROALBUR 70.2 (H) 08/01/2023    MM 3D SCREENING MAMMOGRAM BILATERAL BREAST Result Date: 04/17/2023 CLINICAL DATA:  Screening. EXAM: DIGITAL SCREENING BILATERAL MAMMOGRAM WITH TOMOSYNTHESIS AND CAD TECHNIQUE: Bilateral screening digital craniocaudal and mediolateral oblique mammograms were obtained. Bilateral screening digital breast tomosynthesis was performed. The images were evaluated with computer-aided detection. COMPARISON:  Previous exam(s). ACR Breast Density Category c: The breasts are heterogeneously dense, which may obscure small masses. FINDINGS: There are no findings suspicious for malignancy. IMPRESSION: No mammographic evidence of malignancy. A result letter of this screening mammogram will be mailed directly to the  patient. RECOMMENDATION: Screening mammogram in one year. (Code:SM-B-01Y) BI-RADS CATEGORY  1: Negative. Electronically Signed   By: Toribio Agreste M.D.   On: 04/17/2023 15:33    Assessment & Plan:  Anemia due to zinc  deficiency -     CBC with Differential/Platelet; Future  CKD (chronic kidney disease), stage IV (HCC)- Will avoid nephrotoxic agents  -     Uric acid; Future -     Terazosin  HCl; Take 1 capsule (2 mg total) by mouth at bedtime.  Dispense: 90 capsule; Refill: 0  Essential hypertension- Her blood pressure is not at goal and she has lower extremity edema.  Will discontinue the calcium  channel blocker and start a peripheral alpha-blocker. -     Urinalysis, Routine w reflex microscopic; Future -     EKG 12-Lead -     Terazosin  HCl; Take 1 capsule (2 mg total) by mouth at bedtime.  Dispense: 90 capsule; Refill: 0 -     AMB Referral [facility] Care Management  Anemia in stage 3b chronic kidney disease (HCC) -     IBC + Ferritin; Future -     CBC with Differential/Platelet; Future -     Microalbumin / creatinine urine ratio; Future -     Urinalysis, Routine w reflex microscopic; Future  Insulin -requiring or dependent type II diabetes mellitus (HCC) -     Basic metabolic panel; Future -     Microalbumin / creatinine urine ratio; Future -     Hemoglobin A1c; Future -     Urinalysis, Routine w reflex microscopic; Future -     Lantus  SoloStar; Inject 60 Units into the skin daily.  Dispense: 54 mL; Refill: 1 -     FreeStyle Libre 2 Reader; Place 1 Act onto the skin daily.  Dispense: 1 each; Refill: 5 -     FreeStyle Libre 2 Sensor; Place 1 Act onto the skin daily.  Dispense: 2 each; Refill: 5 -     AMB Referral [facility] Care Management  Edema of both lower legs- The workup for DVT and heart failure is negative.  Will discontinue the CCB. -  Hepatic function panel; Future -     TSH; Future -     Urinalysis, Routine w reflex microscopic; Future -     Troponin I (High Sensitivity);  Future -     Brain natriuretic peptide; Future -     D-dimer, quantitative; Future -     EKG 12-Lead -     VAS US  LOWER EXTREMITY VENOUS (DVT); Future  Chronic idiopathic constipation -     Magnesium ; Future -     Lubiprostone ; Take 1 capsule (24 mcg total) by mouth 2 (two) times daily with a meal.  Dispense: 180 capsule; Refill: 0  Elevated d-dimer -     VAS US  LOWER EXTREMITY VENOUS (DVT); Future  Hyperlipidemia LDL goal <100 -     Pitavastatin  Calcium ; Take 1 tablet (1 mg total) by mouth daily.  Dispense: 90 tablet; Refill: 1  Claudication in peripheral vascular disease (HCC) -     Pitavastatin  Calcium ; Take 1 tablet (1 mg total) by mouth daily.  Dispense: 90 tablet; Refill: 1 -     Aspirin ; Take 1 tablet (81 mg total) by mouth daily. Swallow whole.  Dispense: 90 tablet; Refill: 1  PAD (peripheral artery disease) (HCC) -     Pitavastatin  Calcium ; Take 1 tablet (1 mg total) by mouth daily.  Dispense: 90 tablet; Refill: 1 -     Aspirin ; Take 1 tablet (81 mg total) by mouth daily. Swallow whole.  Dispense: 90 tablet; Refill: 1  Other orders -     EKG     Follow-up: Return in about 6 weeks (around 09/12/2023).  Debby Molt, MD

## 2023-08-02 ENCOUNTER — Ambulatory Visit (HOSPITAL_BASED_OUTPATIENT_CLINIC_OR_DEPARTMENT_OTHER): Payer: Medicare Other

## 2023-08-02 DIAGNOSIS — R7989 Other specified abnormal findings of blood chemistry: Secondary | ICD-10-CM

## 2023-08-02 DIAGNOSIS — R600 Localized edema: Secondary | ICD-10-CM

## 2023-08-02 LAB — URINALYSIS, ROUTINE W REFLEX MICROSCOPIC
Bilirubin Urine: NEGATIVE
Hgb urine dipstick: NEGATIVE
Ketones, ur: NEGATIVE
Leukocytes,Ua: NEGATIVE
Nitrite: NEGATIVE
Specific Gravity, Urine: 1.02 (ref 1.000–1.030)
Total Protein, Urine: 100 — AB
Urine Glucose: NEGATIVE
Urobilinogen, UA: 0.2 (ref 0.0–1.0)
pH: 5.5 (ref 5.0–8.0)

## 2023-08-02 LAB — IBC + FERRITIN
Ferritin: 163.1 ng/mL (ref 10.0–291.0)
Iron: 70 ug/dL (ref 42–145)
Saturation Ratios: 21.7 % (ref 20.0–50.0)
TIBC: 322 ug/dL (ref 250.0–450.0)
Transferrin: 230 mg/dL (ref 212.0–360.0)

## 2023-08-02 LAB — MICROALBUMIN / CREATININE URINE RATIO
Creatinine,U: 107.8 mg/dL
Microalb Creat Ratio: 65.1 mg/g — ABNORMAL HIGH (ref 0.0–30.0)
Microalb, Ur: 70.2 mg/dL — ABNORMAL HIGH (ref 0.0–1.9)

## 2023-08-02 LAB — HEMOGLOBIN A1C: Hgb A1c MFr Bld: 9 % — ABNORMAL HIGH (ref 4.6–6.5)

## 2023-08-02 MED ORDER — LANTUS SOLOSTAR 100 UNIT/ML ~~LOC~~ SOPN: 60.0000 [IU] | PEN_INJECTOR | Freq: Every day | SUBCUTANEOUS | 1 refills | Status: DC

## 2023-08-02 MED ORDER — FREESTYLE LIBRE 2 READER DEVI: 1.0000 | Freq: Every day | 5 refills | Status: DC

## 2023-08-02 MED ORDER — PITAVASTATIN CALCIUM 1 MG PO TABS: 1.0000 mg | ORAL_TABLET | Freq: Every day | ORAL | 1 refills | Status: DC

## 2023-08-02 MED ORDER — ASPIRIN 81 MG PO TBEC: 81.0000 mg | DELAYED_RELEASE_TABLET | Freq: Every day | ORAL | 1 refills | Status: AC

## 2023-08-02 MED ORDER — FREESTYLE LIBRE 2 SENSOR MISC: 1.0000 | Freq: Every day | 5 refills | Status: DC

## 2023-08-03 ENCOUNTER — Encounter: Payer: Self-pay | Admitting: Internal Medicine

## 2023-08-04 MED ORDER — LUBIPROSTONE 24 MCG PO CAPS
24.0000 ug | ORAL_CAPSULE | Freq: Two times a day (BID) | ORAL | 0 refills | Status: DC
Start: 2023-08-04 — End: 2023-11-06

## 2023-08-04 MED ORDER — TERAZOSIN HCL 2 MG PO CAPS: 2.0000 mg | ORAL_CAPSULE | Freq: Every day | ORAL | 0 refills | Status: DC

## 2023-08-08 ENCOUNTER — Encounter (HOSPITAL_BASED_OUTPATIENT_CLINIC_OR_DEPARTMENT_OTHER): Payer: Self-pay | Admitting: *Deleted

## 2023-08-08 ENCOUNTER — Ambulatory Visit (INDEPENDENT_AMBULATORY_CARE_PROVIDER_SITE_OTHER): Payer: Medicare Other

## 2023-08-08 ENCOUNTER — Other Ambulatory Visit (HOSPITAL_BASED_OUTPATIENT_CLINIC_OR_DEPARTMENT_OTHER): Payer: Self-pay

## 2023-08-08 ENCOUNTER — Encounter (HOSPITAL_BASED_OUTPATIENT_CLINIC_OR_DEPARTMENT_OTHER): Payer: Self-pay | Admitting: Cardiovascular Disease

## 2023-08-08 ENCOUNTER — Ambulatory Visit (HOSPITAL_BASED_OUTPATIENT_CLINIC_OR_DEPARTMENT_OTHER): Payer: Medicare Other | Admitting: Family

## 2023-08-08 ENCOUNTER — Ambulatory Visit (HOSPITAL_BASED_OUTPATIENT_CLINIC_OR_DEPARTMENT_OTHER): Payer: Medicare Other | Admitting: Cardiovascular Disease

## 2023-08-08 VITALS — BP 154/68 | HR 70 | Ht 71.0 in | Wt 207.8 lb

## 2023-08-08 DIAGNOSIS — E663 Overweight: Secondary | ICD-10-CM | POA: Diagnosis not present

## 2023-08-08 DIAGNOSIS — Z5181 Encounter for therapeutic drug level monitoring: Secondary | ICD-10-CM

## 2023-08-08 DIAGNOSIS — I10 Essential (primary) hypertension: Secondary | ICD-10-CM

## 2023-08-08 DIAGNOSIS — R0602 Shortness of breath: Secondary | ICD-10-CM

## 2023-08-08 DIAGNOSIS — E785 Hyperlipidemia, unspecified: Secondary | ICD-10-CM

## 2023-08-08 DIAGNOSIS — E1121 Type 2 diabetes mellitus with diabetic nephropathy: Secondary | ICD-10-CM | POA: Diagnosis not present

## 2023-08-08 LAB — ECHOCARDIOGRAM COMPLETE
Area-P 1/2: 3.72 cm2
Height: 71 in
Weight: 3324.8 [oz_av]

## 2023-08-08 MED ORDER — FUROSEMIDE 40 MG PO TABS
40.0000 mg | ORAL_TABLET | Freq: Every day | ORAL | 0 refills | Status: DC
Start: 2023-08-08 — End: 2023-10-20
  Filled 2023-08-08: qty 10, 10d supply, fill #0

## 2023-08-08 NOTE — Patient Instructions (Addendum)
Medication Instructions:  START FUROSEMIDE 40 MG DAILY FOR 5 DAYS ONLY  Labwork: BMET BEFORE FOLLOW UP MONDAY 2/24  Testing/Procedures: Your physician has requested that you have an echocardiogram. Echocardiography is a painless test that uses sound waves to create images of your heart. It provides your doctor with information about the size and shape of your heart and how well your heart's chambers and valves are working. This procedure takes approximately one hour. There are no restrictions for this procedure. Please do NOT wear cologne, perfume, aftershave, or lotions (deodorant is allowed). Please arrive 15 minutes prior to your appointment time.  Please note: We ask at that you not bring children with you during ultrasound (echo/ vascular) testing. Due to room size and safety concerns, children are not allowed in the ultrasound rooms during exams. Our front office staff cannot provide observation of children in our lobby area while testing is being conducted. An adult accompanying a patient to their appointment will only be allowed in the ultrasound room at the discretion of the ultrasound technician under special circumstances. We apologize for any inconvenience.   Follow-Up: 2 MONTHS WITH CAITLIN W NP OR [facility]   08/15/2023 2:45 PM WITH Ronn Melena NP

## 2023-08-08 NOTE — Addendum Note (Signed)
Addended by: Burnell Blanks on: 08/08/2023 06:22 PM   Modules accepted: Orders

## 2023-08-08 NOTE — Progress Notes (Signed)
Cardiology Office Note:  .   Date:  08/08/2023  ID:  Kimberly [name], DOB 03-Dec-1955, MRN [id number] PCP: Kimberly Grandchild, MD  [name] Providers Cardiologist:  None    History of Present Illness: Kimberly [name] Kitchen   Kimberly [name] is a 68 y.o. female with hypertension, hyperlipidemia, GERD, CKD 4, diabetes, and cough-variant asthma here for follow up.  She was initially seen  09/2019 for the evaluation of chest pain shortness of breath.  She saw Kimberly [name] 09/2019 and was noted to have a murmur on exam.  She was referred to cardiology for further evaluation.  She reported some intermittent pain in her left arm that has been occurring for several weeks.  It happens at rest or when laying in the bed and last for a few seconds at a time.  Additionally, she notes some chest tightness that occur separately.  She thinks this is related to stress.  She was referred for an ETT 11/14/2019 that was negative for ischemia.  She achieved 8.8 metabolic equivalents.  Her blood pressure increased to 227/63.  She was started on amlodipine.  She checks her BP at home and it runs around 145.  She is tolerating amlodipine well but doesn't want to add any other medication.  At her initial visit, she increased her pravastatin to for 5 days/week, though she did sometimes skip it because it caused muscle aches. Her lipids were not at goal but she was hesitant to increase her pravastatin. She wanted to focus on diet and exercise.     She complained of pain on her L side and bilateral LE edema. The pain radiated from her lower left side/thigh down to her left foot. She went to the ED 12/16/2020 because her symptoms all occurred on her left side. CT scan showed a possible ovarian cyst. She was following her OBGYN who performed an [place] in her office. Around 11/25/2020, she was infected with COVID-19. She noted her blood pressures were high at home but controlled in the office . She was asked to bring blood pressure cuff to follow up. She  reported LE swelling but doppler 12/2020 was negative for DVT. She saw her nephrologist and was started on losartan.   Her blood pressure was uncontrolled so losartan was increased, and HCTZ was switched to chlorthalidone. She wanted to stop the statin, but agreed to take it once per week. She wasn't interested in starting Repatha. She was started on Nexlizet. She saw her PCP the following month and her blood pressure was controlled, however it was high at follow up 01/2022 so amlodipine was restarted.  At her visit on 02/2022 she was working on her stress by exercising.  She was noted to have a carotid bruit and referred for carotid Dopplers 03/2022 which revealed mild stenosis on the left.   At her visit 08/2022 her BP remained uncontrolled.  She was in pain 2/2 gout.  She was [facility]. She saw Kimberly [name] for PAD and her disease was felt to be mild so medical management was recommended. He advised her to consider Repatha.   Kimberly [name] experiences high blood pressure readings at the doctor's office but does not regularly monitor her blood pressure at home. She is currently taking losartan and amlodipine but was recently prescribed terazosin, which she is reluctant to take. Her creatinine levels have been stable around 2.0 to 2.2 since 2022.  Lately she experiences edema, particularly in her legs, which leaves an imprint  when pressed. She notes frequent nocturia, approximately two to three times per night. She has gained weight and is concerned about being in the 200-pound range for the first time. There is a concern about fluid retention, possibly exacerbated by her current medications and fluid intake.  She reports intermittent pain under her hip bone, which sometimes radiates to her leg. This pain has been present for years and limits her ability to walk for extended periods. Despite this, she continues to exercise using a stair stepper, treadmill, and walking trail, and also engages in  weight training, including squats  She is actively engaged in physical exercise, including gym workouts and walking trails. She is concerned about her waistline and fluid retention in her abdomen, despite engaging in abdominal exercises.      ROS:  As per HPI  Studies Reviewed: .       Echo pending  Risk Assessment/Calculations:     HYPERTENSION CONTROL Vitals:   08/08/23 0853 08/08/23 1154  BP: (!) 158/70 (!) 154/68    The patient's blood pressure is elevated above target today.  In order to address the patient's elevated BP: A new medication was prescribed today.          Physical Exam:   VS:  BP (!) 154/68   Pulse 70   Ht 5\' 11"  (1.803 m)   Wt 207 lb 12.8 oz (94.3 kg)   SpO2 97%   BMI 28.98 kg/m  , BMI Body mass index is 28.98 kg/m. GENERAL:  Well appearing HEENT: Pupils equal round and reactive, fundi not visualized, oral mucosa unremarkable NECK: + jugular venous distention to clavicle sitting upright. , waveform within normal limits, carotid upstroke brisk and symmetric, no bruits, no thyromegaly LYMPHATICS:  No cervical adenopathy LUNGS:  Clear to auscultation bilaterally HEART:  RRR.  PMI not displaced or sustained,S1 and S2 within normal limits, no S3, no S4, no clicks, no rubs, no murmurs ABD:  Flat, positive bowel sounds normal in frequency in pitch, no bruits, no rebound, no guarding, no midline pulsatile mass, no hepatomegaly, no splenomegaly EXT:  2 plus pulses throughout, 2+ pitting edema to upper tibia bilaterally, no cyanosis no clubbing SKIN:  No rashes no nodules NEURO:  Cranial nerves II through XII grossly intact, motor grossly intact throughout PSYCH:  Cognitively intact, oriented to person place and time   ASSESSMENT AND PLAN: .    # Hypertension Persistent high blood pressure despite current medication regimen. Patient is not regularly monitoring blood pressure at home. Recent weight gain noted. -Continue current antihypertensive medications  (Losartan and Amlodipine).  Will likely need to adjust her ARB pending further evaluation of renal function in a week.  -Encourage regular home blood pressure monitoring. -Plan to reassess antihypertensive regimen after fluid management.  # Fluid Overload Patient exhibits pitting edema and reports weight gain. She also has possible fluid retention in the abdomen. Patient admits to high fluid intake and is not restricting salt intake. -Initiate Lasix 40mg  daily for 5 days to reduce fluid overload. -Advise patient to limit fluid intake to 1.5 liters per day and restrict salt intake to less than 2000mg  per day. -Plan to recheck BMP and BNP in 5 days to assess kidney function.  # Chronic Kidney Disease 3b:  Stable creatinine levels around 2-2.2 since 2022. Patient is not restricting fluid or salt intake which may exacerbate kidney function. -Continue current management. -Plan to recheck labs in 5 days after initiation of Lasix.  # Possible Heart Failure Concern  for heart function due to persistent hypertension and fluid overload. Patient reports increased urination at night. -Order echocardiogram to assess heart function. -Plan to reassess after results of echocardiogram and fluid management.  # Musculoskeletal Pain Patient reports pain in the hip area, possibly related to nerve impingement or muscle strain. Pain is limiting exercise capacity. -Encourage continuation of current exercise regimen as tolerated. -Consider further evaluation if pain persists or worsens.  # Ovarian Lesion Stable chronic tubular lesion in the right ovary and left ovarian lesion. No current symptoms related to these lesions. -Continue current management and monitoring.  # Hyperlipidemia Patient is not on any cholesterol-lowering medication and is resistant to starting one. -Continue to discuss the benefits and risks of cholesterol-lowering medication with the patient.  Encouarged PCSK9 inhibitor and she declines.   -Encourage lifestyle modifications including diet and exercise.      # Non-adherence to medical advice:  Ms. Thackston and I had a frank conversation about the importance of adhering to medical advice and taking prescription medications.  She has been very hesitant and prefers natural options which is fine prior to seeing signs of end-organ development and if we see clinical improvement with the efforts. Her BP remains uncontrolled, CKD 3b and now concern for heart failure.  She was congratulated on her excellent exercise efforts but now understands that we will likely need medication to help turn things around in the right direction.      Dispo: f/u 2 weeks.   Signed, Chilton Si, MD

## 2023-08-10 ENCOUNTER — Telehealth: Payer: Self-pay

## 2023-08-10 NOTE — Progress Notes (Signed)
Care Guide Pharmacy Note  08/10/2023 Name: [name] MRN: [id number] DOB: 1956/04/03  Referred By: Etta Grandchild, MD Reason for referral: Care Coordination (Outreach to schedule with Pharm d )   Kimberly [name] is a 68 y.o. year old female who is a primary care patient of Etta Grandchild, MD.  Bronwen Betters was referred to the pharmacist for assistance related to: DMII  Successful contact was made with the patient to discuss pharmacy services.  Patient declines engagement at this time. Contact information was provided to the patient should they wish to reach out for assistance at a later time.  Penne Lash , RMA     [facility]  [facility] Guide  Direct Dial: [phone]  Website: [email or web address]

## 2023-08-15 ENCOUNTER — Ambulatory Visit (HOSPITAL_BASED_OUTPATIENT_CLINIC_OR_DEPARTMENT_OTHER): Payer: Medicare Other | Admitting: Family

## 2023-08-16 ENCOUNTER — Encounter (HOSPITAL_BASED_OUTPATIENT_CLINIC_OR_DEPARTMENT_OTHER): Payer: Self-pay

## 2023-08-17 ENCOUNTER — Other Ambulatory Visit: Payer: Self-pay | Admitting: Internal Medicine

## 2023-08-17 ENCOUNTER — Other Ambulatory Visit (HOSPITAL_BASED_OUTPATIENT_CLINIC_OR_DEPARTMENT_OTHER): Payer: Self-pay | Admitting: Cardiovascular Disease

## 2023-08-17 DIAGNOSIS — I10 Essential (primary) hypertension: Secondary | ICD-10-CM

## 2023-08-25 ENCOUNTER — Other Ambulatory Visit: Payer: Self-pay | Admitting: Internal Medicine

## 2023-08-25 DIAGNOSIS — D538 Other specified nutritional anemias: Secondary | ICD-10-CM

## 2023-08-25 DIAGNOSIS — R0602 Shortness of breath: Secondary | ICD-10-CM | POA: Diagnosis not present

## 2023-08-25 DIAGNOSIS — Z5181 Encounter for therapeutic drug level monitoring: Secondary | ICD-10-CM | POA: Diagnosis not present

## 2023-08-25 DIAGNOSIS — I10 Essential (primary) hypertension: Secondary | ICD-10-CM | POA: Diagnosis not present

## 2023-08-26 LAB — BASIC METABOLIC PANEL
BUN/Creatinine Ratio: 22 (ref 12–28)
BUN: 45 mg/dL — ABNORMAL HIGH (ref 8–27)
CO2: 19 mmol/L — ABNORMAL LOW (ref 20–29)
Calcium: 9.9 mg/dL (ref 8.7–10.3)
Chloride: 111 mmol/L — ABNORMAL HIGH (ref 96–106)
Creatinine, Ser: 2.08 mg/dL — ABNORMAL HIGH (ref 0.57–1.00)
Glucose: 194 mg/dL — ABNORMAL HIGH (ref 70–99)
Potassium: 5.2 mmol/L (ref 3.5–5.2)
Sodium: 145 mmol/L — ABNORMAL HIGH (ref 134–144)
eGFR: 25 mL/min/{1.73_m2} — ABNORMAL LOW (ref >59)

## 2023-08-26 LAB — BRAIN NATRIURETIC PEPTIDE: BNP: 13.7 pg/mL (ref 0.0–100.0)

## 2023-08-31 ENCOUNTER — Encounter (HOSPITAL_BASED_OUTPATIENT_CLINIC_OR_DEPARTMENT_OTHER): Payer: Self-pay | Admitting: Cardiovascular Disease

## 2023-09-04 ENCOUNTER — Other Ambulatory Visit: Payer: Self-pay | Admitting: Family Medicine

## 2023-09-04 DIAGNOSIS — R0982 Postnasal drip: Secondary | ICD-10-CM

## 2023-09-07 ENCOUNTER — Telehealth: Payer: Self-pay

## 2023-09-07 NOTE — Telephone Encounter (Signed)
 Patient was identified as falling into the [place] Measure - Diabetes.   Patient was: Left voicemail to schedule with primary care provider.

## 2023-10-02 ENCOUNTER — Other Ambulatory Visit (HOSPITAL_BASED_OUTPATIENT_CLINIC_OR_DEPARTMENT_OTHER): Payer: Self-pay | Admitting: Cardiovascular Disease

## 2023-10-03 ENCOUNTER — Other Ambulatory Visit (HOSPITAL_BASED_OUTPATIENT_CLINIC_OR_DEPARTMENT_OTHER): Payer: Self-pay

## 2023-10-05 ENCOUNTER — Other Ambulatory Visit (HOSPITAL_BASED_OUTPATIENT_CLINIC_OR_DEPARTMENT_OTHER): Payer: Self-pay

## 2023-10-05 ENCOUNTER — Encounter (HOSPITAL_BASED_OUTPATIENT_CLINIC_OR_DEPARTMENT_OTHER): Payer: Self-pay

## 2023-10-10 ENCOUNTER — Other Ambulatory Visit: Payer: Self-pay | Admitting: Internal Medicine

## 2023-10-10 ENCOUNTER — Ambulatory Visit: Payer: Self-pay | Admitting: Internal Medicine

## 2023-10-10 DIAGNOSIS — I10 Essential (primary) hypertension: Secondary | ICD-10-CM

## 2023-10-10 NOTE — Telephone Encounter (Signed)
 Copied from CRM [id number]. Topic: Clinical - Medication Refill >> Oct 10, 2023 12:29 PM Ovid Blow wrote: Most Recent Primary Care Visit:  Provider: Arcadio Knuckles  Department: [facility]  Visit Type: OFFICE VISIT  Date: 08/01/2023  Medication: amLODipine (NORVASC) 10 MG table  Has the patient contacted their pharmacy? Yes (Agent: If no, request that the patient contact the pharmacy for the refill. If patient does not wish to contact the pharmacy document the reason why and proceed with request.) (Agent: If yes, when and what did the pharmacy advise?)  Is this the correct pharmacy for this prescription? Yes If no, delete pharmacy and type the correct one.  This is the patient's preferred pharmacy:  [facility] [id number] - [place], [place] - [address] [place] [address] Phone: [phone] Fax: [phone]   Has the prescription been filled recently? No  Is the patient out of the medication? Yes  Has the patient been seen for an appointment in the last year OR does the patient have an upcoming appointment? No  Can we respond through MyChart? Yes  Agent: Please be advised that Rx refills may take up to 3 business days. We ask that you follow-up with your pharmacy.

## 2023-10-10 NOTE — Telephone Encounter (Signed)
 Copied from CRM [id number]. Topic: Clinical - Red Word Triage >> Oct 10, 2023 11:29 AM Orien Bird wrote: [facility] that prompted transfer to Nurse Triage: patient called stating her blood pressure has been going up, pt stated it hasn't been stable since her last appt. 196   Chief Complaint: high blood pressure Symptoms: headache Frequency: intermittent Pertinent Negatives: Patient denies chest pain Disposition: [] ED /[] Urgent Care (no appt availability in office) / [x] Appointment(In office/virtual)/ []  [place] [facility]/ [] Home Care/ [] Refused Recommended Disposition /[] [place] [facility]/ []  Follow-up with PCP Additional Notes: High blood pressure last 2 days, denies missing any BP med doses. Offered to scheduled with PCP, net available is in May, offered to schedule with another provider in offer, pt declined at this time. Pt sts she will follow-up with her Cardiologist Dr. Theodis Fiscal since that who has been managing her BP medications. Also patient reports feeling stressed lately due to family issues, states that she feels like she needs to speak with someone. RN provided phone number to [facility] [place] and [facility].  Reason for Disposition  Systolic BP  >= 160 OR Diastolic >= 100  Answer Assessment - Initial Assessment Questions 1. BLOOD PRESSURE: "What is the blood pressure?" "Did you take at least two measurements 5 minutes apart?"     154/70 on L arm; 172/73 R arm;  196/88 last night  2. ONSET: "When did you take your blood pressure?"     10 minutes ago  3. HOW: "How did you take your blood pressure?" (e.g., automatic home BP monitor, visiting nurse)     Home cuff  4. HISTORY: "Do you have a history of high blood pressure?"     Yes  5. MEDICINES: "Are you taking any medicines for blood pressure?" "Have you missed any doses recently?"     Losartan and amlodipine; missed dose this morning  6. OTHER SYMPTOMS: "Do you have any symptoms?" (e.g.,  blurred vision, chest pain, difficulty breathing, headache, weakness)     Headache yesterday  7. PREGNANCY: "Is there any chance you are pregnant?" "When was your last menstrual period?"     no  Protocols used: Blood Pressure - High-A-AH

## 2023-10-11 ENCOUNTER — Telehealth (HOSPITAL_BASED_OUTPATIENT_CLINIC_OR_DEPARTMENT_OTHER): Payer: Self-pay | Admitting: Cardiovascular Disease

## 2023-10-11 DIAGNOSIS — I10 Essential (primary) hypertension: Secondary | ICD-10-CM

## 2023-10-11 MED ORDER — AMLODIPINE BESYLATE 10 MG PO TABS: 10.0000 mg | ORAL_TABLET | Freq: Every day | ORAL | 0 refills | Status: DC

## 2023-10-11 NOTE — Telephone Encounter (Signed)
*  STAT* If patient is at the pharmacy, call can be transferred to refill team.   1. Which medications need to be refilled? (please list name of each medication and dose if known)   amLODipine (NORVASC) 10 MG tablet (Expired)   2. Would you like to learn more about the convenience, safety, & potential cost savings by using the [facility]?   3. Are you open to using the [facility] (Type [facility]. ).  4. Which pharmacy/location (including street and city if local pharmacy) is medication to be sent to?  [facility] [id number] - [place], [place] - [address]   5. Do they need a 30 day or 90 day supply?   90 days  Patient stated she is completely out of this medication.

## 2023-10-17 DIAGNOSIS — K080 Exfoliation of teeth due to systemic causes: Secondary | ICD-10-CM | POA: Diagnosis not present

## 2023-10-20 ENCOUNTER — Ambulatory Visit (HOSPITAL_BASED_OUTPATIENT_CLINIC_OR_DEPARTMENT_OTHER): Payer: Medicare Other | Admitting: Cardiovascular Disease

## 2023-10-20 ENCOUNTER — Encounter (HOSPITAL_BASED_OUTPATIENT_CLINIC_OR_DEPARTMENT_OTHER): Payer: Self-pay | Admitting: Cardiovascular Disease

## 2023-10-20 VITALS — BP 154/70 | HR 76 | Ht 71.0 in | Wt 207.0 lb

## 2023-10-20 DIAGNOSIS — Z5181 Encounter for therapeutic drug level monitoring: Secondary | ICD-10-CM | POA: Diagnosis not present

## 2023-10-20 DIAGNOSIS — I739 Peripheral vascular disease, unspecified: Secondary | ICD-10-CM | POA: Diagnosis not present

## 2023-10-20 DIAGNOSIS — I10 Essential (primary) hypertension: Secondary | ICD-10-CM | POA: Diagnosis not present

## 2023-10-20 MED ORDER — FUROSEMIDE 40 MG PO TABS: ORAL_TABLET | ORAL | 3 refills | Status: AC

## 2023-10-20 MED ORDER — LOSARTAN POTASSIUM 100 MG PO TABS: 100.0000 mg | ORAL_TABLET | Freq: Every day | ORAL | 1 refills | Status: DC

## 2023-10-20 NOTE — Progress Notes (Signed)
 Cardiology Office Note:  .   Date:  10/20/2023  ID:  [name], DOB December 11, 1955, MRN [id number] PCP: Arcadio Knuckles, MD  [facility] Providers Cardiologist:  None    History of Present Illness: [name]    Kimberly [name] is a 68 y.o. female with hypertension, hyperlipidemia, GERD, CKD 4, diabetes, and cough-variant asthma here for follow up.  She was initially seen  09/2019 for the evaluation of chest pain shortness of breath.  She saw Dr. [place] 09/2019 and was noted to have a murmur on exam.  She was referred to cardiology for further evaluation.  She reported some intermittent pain in her left arm that has been occurring for several weeks.  It happens at rest or when laying in the bed and last for a few seconds at a time.  Additionally, she notes some chest tightness that occur separately.  She thinks this is related to stress.  She was referred for an ETT 11/14/2019 that was negative for ischemia.  She achieved 8.8 metabolic equivalents.  Her blood pressure increased to 227/63.  She was started on amlodipine .  She checks her BP at home and it runs around 145.  She is tolerating amlodipine  well but doesn't want to add any other medication.  At her initial visit, she increased her pravastatin  to for 5 days/week, though she did sometimes skip it because it caused muscle aches. Her lipids were not at goal but she was hesitant to increase her pravastatin . She wanted to focus on diet and exercise.     She complained of pain on her L side and bilateral LE edema. The pain radiated from her lower left side/thigh down to her left foot. She went to the ED 12/16/2020 because her symptoms all occurred on her left side. CT scan showed a possible ovarian cyst. She was following her OBGYN who performed an US  in her office. Around 11/25/2020, she was infected with COVID-19. She noted her blood pressures were high at home but controlled in the office . She was asked to bring blood pressure cuff to follow up. She  reported LE swelling but doppler 12/2020 was negative for DVT. She saw her nephrologist and was started on losartan .   Her blood pressure was uncontrolled so losartan  was increased, and HCTZ was switched to chlorthalidone . She wanted to stop the statin, but agreed to take it once per week. She wasn't interested in starting Repatha . She was started on Nexlizet . She saw her PCP the following month and her blood pressure was controlled, however it was high at follow up 01/2022 so amlodipine  was restarted.  At her visit on 02/2022 she was working on her stress by exercising.  She was noted to have a carotid bruit and referred for carotid Dopplers 03/2022 which revealed mild stenosis on the left.   At her visit 08/2022 her BP remained uncontrolled.  She was in pain 2/2 gout.  Diuretic discontinued.  She was referred to the [facility]. She saw Dr. Charlotte Cookey for PAD and her disease was felt to be mild so medical management was recommended. He advised her to consider Repatha .    [name] experiences high blood pressure readings at the doctor's office but does not regularly monitor her blood pressure at home. She is currently taking losartan  and amlodipine  but was recently prescribed terazosin , which she is reluctant to take. Her creatinine levels have been stable around 2.0 to 2.2 since 2022. SHe noted increased edema and was started on  lasix . Echo 07/2023 revealed LVEF 55-60% with mild LVH and grade 1 diastolic dysfunction.     Discussed the use of AI scribe software for clinical note transcription with the patient, who gave verbal consent to proceed.  History of Present Illness [name] has been experiencing issues with medication refills, particularly with Lasix , which she was unable to refill beyond a seven-day supply. Despite using compression socks, she continues to have persistent swelling that does not fully resolve. She experiences tightness when wearing the socks and reports high blood pressure readings  at home, with systolic values reaching as high as 170 mmHg. Her kidney function is stable, with a recent creatinine level of 2.08, slightly improved from 2.18. She is currently taking Lasix  40 mg daily, amlodipine , and losartan  50 mg.   She has a history of diverticulitis and reports ongoing abdominal pain, which is persistent and located in the same spot over the years. She has not had surgery for her ovaries and mentions that the pain sometimes occurs randomly.  She experiences shortness of breath when climbing stairs but can walk a mile when feeling well. She has not had a stress test in several years and reports being able to exercise without significant issues.  She recently restarted amlodipine  after being out of it for an unknown period.  She reports not using pitavastatin  due to leg cramps and is not interested in injectable cholesterol medications. Her LDL cholesterol was last recorded at 138 mg/dL.  ROS:  As per HPI  Studies Reviewed: .         Risk Assessment/Calculations:     HYPERTENSION CONTROL Vitals:   10/20/23 1146 10/20/23 1754  BP: (!) 154/74 (!) 154/70    The patient's blood pressure is elevated above target today.  In order to address the patient's elevated BP: A new medication was prescribed today.          Physical Exam:   VS:  BP (!) 154/70   Pulse 76   Ht 5\' 11"  (1.803 m)   Wt 207 lb (93.9 kg)   BMI 28.87 kg/m  , BMI Body mass index is 28.87 kg/m. GENERAL:  Well appearing HEENT: Pupils equal round and reactive, fundi not visualized, oral mucosa unremarkable NECK:  No jugular venous distention, waveform within normal limits, carotid upstroke brisk and symmetric, no bruits, no thyromegaly LUNGS:  Clear to auscultation bilaterally HEART:  RRR.  PMI not displaced or sustained,S1 and S2 within normal limits, no S3, no S4, no clicks, no rubs, no murmurs ABD:  Flat, positive bowel sounds normal in frequency in pitch, no bruits, no rebound, no guarding, no  midline pulsatile mass, no hepatomegaly, no splenomegaly EXT:  2 plus pulses throughout, no edema, no cyanosis no clubbing SKIN:  No rashes no nodules NEURO:  Cranial nerves II through XII grossly intact, motor grossly intact throughout PSYCH:  Cognitively intact, oriented to person place and time    ASSESSMENT AND PLAN: .    Assessment & Plan # Hypertension Blood pressure elevated with home readings up to 170 mmHg systolic. Medication lapse with allopurinol  may contribute to elevation. - Increase losartan  to 100 mg after completing higher dose of Lasix . - Continue amlodipine  and terazosin   # HFpEF:  Persistent edema with fluid retention. Heart failure ruled out. Increased urination with higher Lasix  dose indicates effectiveness.  For unclear reasons, it was not refilled when she called in.  - Administer Lasix  80 mg for 5 days, then reduce to 40 mg daily. -  Check BMP and BNP in a week.  # Chronic kidney disease, unspecified Creatinine improved from 2.18 to 2.08. Monitoring necessary due to diuretic therapy adjustments. - Monitor kidney function regularly.  # Hyperlipidemia LDL cholesterol elevated at 138 mg/dL. Discussed alternative treatments, preference to avoid injections.  She is not interested.        Dispo: f/u 11/2023  Signed, Maudine Sos, MD

## 2023-10-20 NOTE — Patient Instructions (Addendum)
 Medication Instructions:  TAKE FUROSEMIDE  40 MG 2 TABLETS DAILY FOR 5 DAYS THEN DECREASE TO 40 MG 1 TABLET DAILY  AFTER YOU DECREASE FUROSEMIDE  TO 40 MG DAILY INCREASE YOUR LOSARTAN  TO 100 MG DAILY   *If you need a refill on your cardiac medications before your next appointment, please call your pharmacy*  Lab Work: BMET 2 DAYS PRIOR TO FOLLOW UP   If you have labs (blood work) drawn today and your tests are completely normal, you will receive your results only by: MyChart Message (if you have MyChart) OR A paper copy in the mail If you have any lab test that is abnormal or we need to change your treatment, we will call you to review the results.  Testing/Procedures: NONE   Follow-Up:  12/01/2023 10:00 AM WITH DR Wahneta   We recommend signing up for the patient portal called "MyChart".  Sign up information is provided on this After Visit Summary.  MyChart is used to connect with patients for Virtual Visits (Telemedicine).  Patients are able to view lab/test results, encounter notes, upcoming appointments, etc.  Non-urgent messages can be sent to your provider as well.   To learn more about what you can do with MyChart, go to [email or web address].

## 2023-10-24 DIAGNOSIS — N184 Chronic kidney disease, stage 4 (severe): Secondary | ICD-10-CM | POA: Diagnosis not present

## 2023-10-24 DIAGNOSIS — D631 Anemia in chronic kidney disease: Secondary | ICD-10-CM | POA: Diagnosis not present

## 2023-10-24 DIAGNOSIS — I129 Hypertensive chronic kidney disease with stage 1 through stage 4 chronic kidney disease, or unspecified chronic kidney disease: Secondary | ICD-10-CM | POA: Diagnosis not present

## 2023-10-24 DIAGNOSIS — N2581 Secondary hyperparathyroidism of renal origin: Secondary | ICD-10-CM | POA: Diagnosis not present

## 2023-10-24 DIAGNOSIS — E1122 Type 2 diabetes mellitus with diabetic chronic kidney disease: Secondary | ICD-10-CM | POA: Diagnosis not present

## 2023-10-24 DIAGNOSIS — R252 Cramp and spasm: Secondary | ICD-10-CM | POA: Diagnosis not present

## 2023-10-29 ENCOUNTER — Other Ambulatory Visit: Payer: Self-pay | Admitting: Internal Medicine

## 2023-10-29 DIAGNOSIS — N184 Chronic kidney disease, stage 4 (severe): Secondary | ICD-10-CM

## 2023-10-29 DIAGNOSIS — I10 Essential (primary) hypertension: Secondary | ICD-10-CM

## 2023-10-30 ENCOUNTER — Ambulatory Visit: Admitting: Internal Medicine

## 2023-10-31 DIAGNOSIS — K5904 Chronic idiopathic constipation: Secondary | ICD-10-CM | POA: Diagnosis not present

## 2023-10-31 DIAGNOSIS — R194 Change in bowel habit: Secondary | ICD-10-CM | POA: Diagnosis not present

## 2023-10-31 DIAGNOSIS — R195 Other fecal abnormalities: Secondary | ICD-10-CM | POA: Diagnosis not present

## 2023-10-31 DIAGNOSIS — Z1211 Encounter for screening for malignant neoplasm of colon: Secondary | ICD-10-CM | POA: Diagnosis not present

## 2023-10-31 DIAGNOSIS — K8080 Other cholelithiasis without obstruction: Secondary | ICD-10-CM | POA: Diagnosis not present

## 2023-11-06 ENCOUNTER — Ambulatory Visit (INDEPENDENT_AMBULATORY_CARE_PROVIDER_SITE_OTHER): Admitting: Family Medicine

## 2023-11-06 ENCOUNTER — Ambulatory Visit: Payer: Self-pay

## 2023-11-06 ENCOUNTER — Encounter: Payer: Self-pay | Admitting: Family Medicine

## 2023-11-06 VITALS — BP 164/78 | HR 80 | Temp 98.4°F | Resp 18 | Ht 71.0 in | Wt 195.0 lb

## 2023-11-06 DIAGNOSIS — K5904 Chronic idiopathic constipation: Secondary | ICD-10-CM | POA: Diagnosis not present

## 2023-11-06 DIAGNOSIS — E1165 Type 2 diabetes mellitus with hyperglycemia: Secondary | ICD-10-CM

## 2023-11-06 DIAGNOSIS — Z7985 Long-term (current) use of injectable non-insulin antidiabetic drugs: Secondary | ICD-10-CM

## 2023-11-06 DIAGNOSIS — R112 Nausea with vomiting, unspecified: Secondary | ICD-10-CM | POA: Diagnosis not present

## 2023-11-06 DIAGNOSIS — R1012 Left upper quadrant pain: Secondary | ICD-10-CM

## 2023-11-06 DIAGNOSIS — E1140 Type 2 diabetes mellitus with diabetic neuropathy, unspecified: Secondary | ICD-10-CM

## 2023-11-06 LAB — LIPASE: Lipase: 111 U/L — ABNORMAL HIGH (ref 11.0–59.0)

## 2023-11-06 LAB — AMYLASE: Amylase: 214 U/L — ABNORMAL HIGH (ref 27–131)

## 2023-11-06 NOTE — Patient Instructions (Signed)
 Stop Linzess. Call GI doctor and see if you can get a lower dosage.   Increase protein intake and decrease carbs.   Miralax for constipation.  Continue Ozempic .

## 2023-11-06 NOTE — Telephone Encounter (Signed)
 Chief Complaint: hyperglycemia  Symptoms: HG 257, abdominal pain, nausea, vomiting Friday, weakness, fatigue Frequency: since Friday Pertinent Negatives: Patient denies fever, CP, SOB, blurry vision Disposition: [] ED /[] Urgent Care (no appt availability in office) / [x] Appointment(In office/virtual)/ []  [name]/ [] Home Care/ [] Refused Recommended Disposition /[] [facility]/ []  Follow-up with PCP Additional Notes: Pt reports her blood sugar has been high lately. This AM it was 257. Pt reports she started taking Ozempic  3 wks ago prescribed by a kidney doctor and has since stopped taking her insulin . Pt states she thought Ozempic  was supposed to take the place of the insulin . Pt reports nausea with abdominal pain. Pt states she vomited Friday and had diarrhea as well. Pt took her Ozempic  shot yesterday. Pt took her BP meds today but states her BP was high at 144/76. Pt denies blurry vision but endorses a slight H/A. Pt denies CP or SOB. Denies vomiting today. Pt endorses fatigue and states she is napping frequently. RN scheduled pt to be seen at 1440 today. Pt agreeable to that plan. RN advised pt if she worsens before then to go to the ED. Pt verbalized understanding.  Copied from CRM [id number]. Topic: Clinical - Red Word Triage >> Nov 06, 2023 11:48 AM [name] wrote: [facility] that prompted transfer to Nurse Triage: high blood sugar 237 Reason for Disposition  [1] MODERATE pain (e.g., interferes with normal activities) AND [2] pain comes and goes (cramps) AND [3] present > 24 hours  (Exception: Pain with Vomiting or Diarrhea - see that Guideline.)  Answer Assessment - Initial Assessment Questions 1. BLOOD GLUCOSE: "What is your blood glucose level?"      257 today before eating 2. ONSET: "When did you check the blood glucose?"     This AM 3. USUAL RANGE: "What is your glucose level usually?" (e.g., usual fasting morning value, usual evening value)     "It's been high  lately" 5. TYPE 1 or 2:  "Do you know what type of diabetes you have?"  (e.g., Type 1, Type 2, Gestational; doesn't know)      Type 2 6. INSULIN : "Do you take insulin ?" "What type of insulin (s) do you use? What is the mode of delivery? (syringe, pen; injection or pump)?"      None in 3 wks 7. DIABETES PILLS: "Do you take any pills for your diabetes?" If Yes, ask: "Have you missed taking any pills recently?"     No  8. OTHER SYMPTOMS: "Do you have any symptoms?" (e.g., fever, frequent urination, difficulty breathing, dizziness, weakness, vomiting)     Endorses abdominal pain, states last Friday she vomited and had diarrhea. States she was prescribed a new medication that she has not taken since Friday. Endorses nausea. Taking ozempic  - took it yesterday. Prescribed Linzess, not taken since Friday. Has not taken insulin  since she started taking ozempic . Has not had insulin  in 3 wks  BP been running high. 144/76 and HR 76 at 0830 this AM. Sugar was 257. Endorses slight headache, no blurry vision, does endorse weakness/fatigue. Able to stand and walk. "I sleep a lot"  States her doctor had her cut her amlodipine  in half  Answer Assessment - Initial Assessment Questions 1. NAUSEA SEVERITY: "How bad is the nausea?" (e.g., mild, moderate, severe; dehydration, weight loss)   - MILD: loss of appetite without change in eating habits   - MODERATE: decreased oral intake without significant weight loss, dehydration, or malnutrition   - SEVERE: inadequate caloric or  fluid intake, significant weight loss, symptoms of dehydration     Vomited Friday and has not vomited since, nausea is mild-moderate 2. ONSET: "When did the nausea begin?"     Friday 3. VOMITING: "Any vomiting?" If Yes, ask: "How many times today?"     Friday 4. RECURRENT SYMPTOM: "Have you had nausea before?" If Yes, ask: "When was the last time?" "What happened that time?"     N/a 5. CAUSE: "What do you think is causing the nausea?"      Hyperglycemia, Ozempic   Answer Assessment - Initial Assessment Questions 1. LOCATION: "Where does it hurt?"      Abdominal pain 2. RADIATION: "Does the pain shoot anywhere else?" (e.g., chest, back)     N/a 3. ONSET: "When did the pain begin?" (e.g., minutes, hours or days ago)      Friday  5. PATTERN "Does the pain come and go, or is it constant?"    - If it comes and goes: "How long does it last?" "Do you have pain now?"     (Note: Comes and goes means the pain is intermittent. It goes away completely between bouts.)    - If constant: "Is it getting better, staying the same, or getting worse?"      (Note: Constant means the pain never goes away completely; most serious pain is constant and gets worse.)      Comes and goes  6. SEVERITY: "How bad is the pain?"  (e.g., Scale 1-10; mild, moderate, or severe)    - MILD (1-3): Doesn't interfere with normal activities, abdomen soft and not tender to touch.     - MODERATE (4-7): Interferes with normal activities or awakens from sleep, abdomen tender to touch.     - SEVERE (8-10): Excruciating pain, doubled over, unable to do any normal activities.        7. RECURRENT SYMPTOM: "Have you ever had this type of stomach pain before?" If Yes, ask: "When was the last time?" and "What happened that time?"      8. CAUSE: "What do you think is causing the stomach pain?"      9. RELIEVING/AGGRAVATING FACTORS: "What makes it better or worse?" (e.g., antacids, bending or twisting motion, bowel movement)      10. OTHER SYMPTOMS: "Do you have any other symptoms?" (e.g., back pain, diarrhea, fever, urination pain, vomiting)       Vomited Friday  Protocols used: Diabetes - High Blood Sugar-A-AH, Nausea-A-AH, Abdominal Pain - Female-A-AH

## 2023-11-06 NOTE — Progress Notes (Unsigned)
 Assessment & Plan:  1. Nausea and vomiting, unspecified vomiting type (Primary) Discussed this could be due to Ozempic , Linzess, LUQ.  After discussing she does not plan to resume Linzess at 290 mcg.  So I encouraged her to reach out to her gastroenterologist and see if they would give her a lower dose to try.  Encouraged high-protein, low-carb diet while using Ozempic .  Education provided on protein and different foods. - Amylase - Lipase  2. LUQ abdominal pain - Amylase - Lipase  3. Chronic idiopathic constipation After discussing she does not plan to resume Linzess at 290 mcg.  So I encouraged her to reach out to her gastroenterologist and see if they would give her a lower dose to try.  May also try MiraLAX.  4. Type 2 diabetes mellitus with hyperglycemia, without long-term current use of insulin  (HCC) Discussed she may experience some hyperglycemia while transitioning off of Lantus  and onto Ozempic  until she is on an appropriate dosage.  Encouraged to follow-up with nephrology since they are managing this.   Follow up plan: Return if symptoms worsen or fail to improve.  Hershel Los, MSN, APRN, FNP-C  Subjective:  HPI: [name] is a 68 y.o. female presenting on 11/06/2023 for Diabetes (Changes in DM meds recently = BS running higher with fatigue and nausea at times. Started seeing changes last Friday //Stopped lantus  3 weeks ago and was started on ozempic  (0.25 weekly x 4 wks, then to increase to 0.5 after))  Patient presents with concerns of nausea, fatigue, and hyperglycemia.  Nausea/Fatigue: Patient reports her stomach started hurting four days ago; she then experienced nausea and vomiting the following morning after eating pineapple.  She is nauseated again this morning after eating a bagel, sausage, and grapes.  Her newest medication is Linzess 290 mcg daily which she started last Tuesday (two days prior to the onset of abdominal pain).  She has not taken it since  throwing up Friday morning.  Hyperglycemia: Patient reports her nephrologist stopped her Lantus  and started her on Ozempic  0.25 mg weekly.  Per chart review this change was made two weeks ago.    ROS: Negative unless specifically indicated above in HPI.   Relevant past medical history reviewed and updated as indicated.   Allergies and medications reviewed and updated.   Current Outpatient Medications:    allopurinol  (ZYLOPRIM ) 100 MG tablet, TAKE 1 TABLET BY MOUTH DAILY TO PREVENT GOUT, Disp: 90 tablet, Rfl: 1   amLODipine  (NORVASC ) 10 MG tablet, Take 1 tablet (10 mg total) by mouth daily., Disp: 90 tablet, Rfl: 0   aspirin  EC 81 MG tablet, Take 1 tablet (81 mg total) by mouth daily. Swallow whole., Disp: 90 tablet, Rfl: 1   Blood Glucose Monitoring Suppl (CONTOUR NEXT MONITOR) w/Device KIT, Use to test blood sugars 4 times daily. Dx: e11.9, Disp: 1 kit, Rfl: 1   cetirizine  (ZYRTEC ) 10 MG tablet, TAKE 1 TABLET BY MOUTH DAILY, Disp: 90 tablet, Rfl: 0   furosemide  (LASIX ) 40 MG tablet, Take 40 mg 2 tablets daily for 5 days then decrease to 1 tablet daily., Disp: 40 tablet, Rfl: 3   glucose blood (CONTOUR NEXT TEST) test strip, USE TO TEST BLOOD SUGARS 4 TIMES A DAY, Disp: 400 strip, Rfl: 2   Insulin  Pen Needle (BD PEN NEEDLE NANO 2ND GEN) 32G X 4 MM MISC, use as directed, Disp: 30 each, Rfl: 0   KERENDIA  20 MG TABS, Take 1 tablet by mouth daily., Disp: , Rfl:  linaclotide (LINZESS) 290 MCG CAPS capsule, Take 290 mcg by mouth daily before breakfast., Disp: , Rfl:    losartan  (COZAAR ) 100 MG tablet, Take 1 tablet (100 mg total) by mouth daily., Disp: 90 tablet, Rfl: 1   Microlet Lancets MISC, USE TO CHECK BLOOD SUGARS 4 TIMES A DAY, Disp: 400 each, Rfl: 12   OZEMPIC , 0.25 OR 0.5 MG/DOSE, 2 MG/3ML SOPN, Inject 0.25 mg into the skin once a week., Disp: , Rfl:    zinc  gluconate 50 MG tablet, 1 BY MOUTH DAILY, Disp: 90 tablet, Rfl: 0   albuterol  (VENTOLIN  HFA) 108 (90 Base) MCG/ACT inhaler,  Inhale 2 puffs into the lungs every 4 (four) hours as needed. ADDITIONAL REFILLS FROM PCP (Patient not taking: Reported on 11/06/2023), Disp: 1 each, Rfl: 0  Allergies  Allergen Reactions   Semaglutide (0.25 Or 0.5mg -Dos) Nausea Only   Ace Inhibitors Cough   Rosuvastatin      Muscle cramps    Objective:   BP (!) 164/78   Pulse 80   Temp 98.4 F (36.9 C)   Resp 18   Ht 5\' 11"  (1.803 m)   Wt 195 lb (88.5 kg)   BMI 27.20 kg/m    Physical Exam Vitals reviewed.  Constitutional:      General: She is not in acute distress.    Appearance: Normal appearance. She is overweight. She is not ill-appearing, toxic-appearing or diaphoretic.  HENT:     Head: Normocephalic and atraumatic.  Eyes:     General: No scleral icterus.       Right eye: No discharge.        Left eye: No discharge.     Conjunctiva/sclera: Conjunctivae normal.  Cardiovascular:     Rate and Rhythm: Normal rate and regular rhythm.     Heart sounds: Normal heart sounds. No murmur heard.    No friction rub. No gallop.  Pulmonary:     Effort: Pulmonary effort is normal. No respiratory distress.     Breath sounds: Normal breath sounds. No stridor. No wheezing, rhonchi or rales.  Abdominal:     Palpations: There is no hepatomegaly, splenomegaly, mass or pulsatile mass.     Tenderness: There is abdominal tenderness in the left upper quadrant.  Musculoskeletal:        General: Normal range of motion.     Cervical back: Normal range of motion.  Skin:    General: Skin is warm and dry.     Capillary Refill: Capillary refill takes less than 2 seconds.  Neurological:     General: No focal deficit present.     Mental Status: She is alert and oriented to person, place, and time. Mental status is at baseline.  Psychiatric:        Mood and Affect: Mood normal.        Behavior: Behavior normal.        Thought Content: Thought content normal.        Judgment: Judgment normal.

## 2023-11-07 ENCOUNTER — Ambulatory Visit: Payer: Self-pay | Admitting: Family Medicine

## 2023-11-09 ENCOUNTER — Encounter: Payer: Self-pay | Admitting: Family Medicine

## 2023-11-15 ENCOUNTER — Ambulatory Visit (INDEPENDENT_AMBULATORY_CARE_PROVIDER_SITE_OTHER): Payer: Medicare Other

## 2023-11-15 VITALS — BP 168/62 | HR 66 | Ht 71.0 in | Wt 190.0 lb

## 2023-11-15 DIAGNOSIS — Z0000 Encounter for general adult medical examination without abnormal findings: Secondary | ICD-10-CM

## 2023-11-15 NOTE — Patient Instructions (Addendum)
 Ms. Cottam , Thank you for taking time out of your busy schedule to complete your Annual Wellness Visit with me. I enjoyed our conversation and look forward to speaking with you again next year. I, as well as your care team,  appreciate your ongoing commitment to your health goals. Please review the following plan we discussed and let me know if I can assist you in the future. Your Game plan/ To Do List   Follow up Visits: Next Medicare AWV with our clinical staff: 11/15/2024   Have you seen your provider in the last 6 months (3 months if uncontrolled diabetes)? Yes Next Office Visit with your provider: 12/06/2023  Clinician Recommendations:  Aim for 30 minutes of exercise or brisk walking, 6-8 glasses of water, and 5 servings of fruits and vegetables each day. Educated and advised on getting the 2nd Shingles vaccine at local pharmacy.      This is a list of the screening recommended for you and due dates:  Health Maintenance  Topic Date Due   Zoster (Shingles) Vaccine (2 of 2) 08/03/2022   Complete foot exam   10/27/2023   Flu Shot  01/26/2024   Hemoglobin A1C  01/29/2024   DTaP/Tdap/Td vaccine (2 - Td or Tdap) 04/30/2024   Eye exam for diabetics  05/09/2024   Yearly kidney health urinalysis for diabetes  07/31/2024   Yearly kidney function blood test for diabetes  08/24/2024   Medicare Annual Wellness Visit  11/14/2024   Mammogram  04/12/2025   Colon Cancer Screening  07/18/2026   Pneumonia Vaccine  Completed   DEXA scan (bone density measurement)  Completed   Hepatitis C Screening  Completed   HPV Vaccine  Aged Out   Meningitis B Vaccine  Aged Out   COVID-19 Vaccine  Discontinued    Advanced directives: (In Chart) A copy of your advanced directives are scanned into your chart should your provider ever need it. Advance Care Planning is important because it:  [x]  Makes sure you receive the medical care that is consistent with your values, goals, and preferences  [x]  It provides  guidance to your family and loved ones and reduces their decisional burden about whether or not they are making the right decisions based on your wishes.  Follow the link provided in your after visit summary or read over the paperwork we have mailed to you to help you started getting your Advance Directives in place. If you need assistance in completing these, please reach out to us  so that we can help you!

## 2023-11-15 NOTE — Progress Notes (Signed)
 Subjective:   Kimberly [name] is a 68 y.o. who presents for a Medicare Wellness preventive visit.  As a reminder, Annual Wellness Visits don't include a physical exam, and some assessments may be limited, especially if this visit is performed virtually. We may recommend an in-person follow-up visit with your provider if needed.  Visit Complete: In person  Persons Participating in Visit: Patient.  AWV Questionnaire: Yes: Patient Medicare AWV questionnaire was completed by the patient on 11/13/2023; I have confirmed that all information answered by patient is correct and no changes since this date.  Cardiac Risk Factors include: advanced age (>50men, >70 women);diabetes mellitus;dyslipidemia;hypertension     Objective:     Today's Vitals   11/15/23 1007  BP: (!) 168/62  Pulse: 66  SpO2: 97%  Weight: 190 lb (86.2 kg)  Height: 5\' 11"  (1.803 m)   Body mass index is 26.5 kg/m.     11/15/2023    9:57 AM 01/05/2023    3:18 PM 11/14/2022   10:36 AM 11/09/2021    9:52 AM 08/26/2021   11:40 AM 07/09/2018    2:07 PM 05/06/2016   10:29 AM  Advanced Directives  Does Patient Have a Medical Advance Directive? Yes Yes Yes No No No No  Type of Estate agent of [place];Living will Healthcare Power of [place];Living will Healthcare Power of [place];Living will      Does patient want to make changes to medical advance directive? No - Patient declined        Copy of Healthcare Power of Attorney in Chart? Yes - validated most recent copy scanned in chart (See row information)  No - copy requested      Would patient like information on creating a medical advance directive?    No - Patient declined No - Patient declined Yes (MAU/Ambulatory/Procedural Areas - Information given) No - patient declined information    Current Medications (verified) Outpatient Encounter Medications as of 11/15/2023  Medication Sig   albuterol  (VENTOLIN  HFA) 108 (90 Base) MCG/ACT inhaler Inhale 2  puffs into the lungs every 4 (four) hours as needed. ADDITIONAL REFILLS FROM PCP   allopurinol  (ZYLOPRIM ) 100 MG tablet TAKE 1 TABLET BY MOUTH DAILY TO PREVENT GOUT   amLODipine  (NORVASC ) 10 MG tablet Take 1 tablet (10 mg total) by mouth daily.   aspirin  EC 81 MG tablet Take 1 tablet (81 mg total) by mouth daily. Swallow whole.   Blood Glucose Monitoring Suppl (CONTOUR NEXT MONITOR) w/Device KIT Use to test blood sugars 4 times daily. Dx: e11.9   cetirizine  (ZYRTEC ) 10 MG tablet TAKE 1 TABLET BY MOUTH DAILY   colchicine  0.6 MG tablet Take 0.6 mg by mouth daily. By Garlan Juniper - take as need for gout flare up   furosemide  (LASIX ) 40 MG tablet Take 40 mg 2 tablets daily for 5 days then decrease to 1 tablet daily.   glucose blood (CONTOUR NEXT TEST) test strip USE TO TEST BLOOD SUGARS 4 TIMES A DAY   Insulin  Pen Needle (BD PEN NEEDLE NANO 2ND GEN) 32G X 4 MM MISC use as directed   KERENDIA  20 MG TABS Take 1 tablet by mouth daily.   losartan  (COZAAR ) 100 MG tablet Take 1 tablet (100 mg total) by mouth daily.   Microlet Lancets MISC USE TO CHECK BLOOD SUGARS 4 TIMES A DAY   OZEMPIC , 0.25 OR 0.5 MG/DOSE, 2 MG/3ML SOPN Inject 0.25 mg into the skin once a week.   zinc  gluconate 50 MG tablet  1 BY MOUTH DAILY   linaclotide (LINZESS) 290 MCG CAPS capsule Take 290 mcg by mouth daily before breakfast. (Patient not taking: Reported on 11/15/2023)   No facility-administered encounter medications on file as of 11/15/2023.    Allergies (verified) Semaglutide (0.25 or 0.5mg -dos), Ace inhibitors, and Rosuvastatin    History: Past Medical History:  Diagnosis Date   Arthritis    Carotid bruit 03/21/2022   Cataract    OD   Chronic kidney disease    Diabetes mellitus 2001   Hyperlipidemia Dx 2012   Lower extremity edema 01/06/2021   Past Surgical History:  Procedure Laterality Date   CATARACT EXTRACTION Left    EYE SURGERY Left    Cat Sx   VAGINAL HYSTERECTOMY     AUB (surgery in [place]), prior to 2000    Family History  Problem Relation Age of Onset   Depression Mother    Hypertension Mother    Diabetes Mother    Heart disease Father    Heart attack Father 37   Uterine cancer Sister    Breast cancer Sister    Lung cancer Brother    Prostate cancer Neg Hx    Endometrial cancer Neg Hx    Ovarian cancer Neg Hx    Colon cancer Neg Hx    Pancreatic cancer Neg Hx    Social History   Socioeconomic History   Marital status: Divorced    Spouse name: Not on file   Number of children: Not on file   Years of education: Not on file   Highest education level: Bachelor's degree (e.g., BA, AB, BS)  Occupational History   Occupation: retired  Tobacco Use   Smoking status: Never    Passive exposure: Never   Smokeless tobacco: Never  Vaping Use   Vaping status: Never Used  Substance and Sexual Activity   Alcohol use: Yes    Alcohol/week: 4.0 standard drinks of alcohol    Types: 4 Glasses of wine per week    Comment: occ   Drug use: No   Sexual activity: Not Currently    Partners: Female  Other Topics Concern   Not on file  Social History Narrative   Not on file   Social Drivers of Health   Financial Resource Strain: Medium Risk (11/15/2023)   Overall Financial Resource Strain (CARDIA)    Difficulty of Paying Living Expenses: Somewhat hard  Food Insecurity: Food Insecurity Present (11/15/2023)   Hunger Vital Sign    Worried About Running Out of Food in the Last Year: Sometimes true    Ran Out of Food in the Last Year: Sometimes true  Transportation Needs: No Transportation Needs (11/15/2023)   PRAPARE - Administrator, Civil Service (Medical): No    Lack of Transportation (Non-Medical): No  Physical Activity: Sufficiently Active (11/15/2023)   Exercise Vital Sign    Days of Exercise per Week: 4 days    Minutes of Exercise per Session: 100 min  Stress: Stress Concern Present (11/15/2023)   [employer] of Occupational Health - Occupational Stress Questionnaire     Feeling of Stress : Rather much  Social Connections: Moderately Integrated (11/15/2023)   Social Connection and Isolation Panel [NHANES]    Frequency of Communication with Friends and Family: More than three times a week    Frequency of Social Gatherings with Friends and Family: Once a week    Attends Religious Services: More than 4 times per year    Active Member of [employer] or Organizations:  Yes    Attends Banker Meetings: More than 4 times per year    Marital Status: Divorced    Tobacco Counseling Counseling given: No    Clinical Intake:  Pre-visit preparation completed: Yes  Pain : No/denies pain     BMI - recorded: 26.5 Nutritional Status: BMI 25 -29 Overweight Nutritional Risks: None Diabetes: Yes CBG done?: Yes CBG resulted in Enter/ Edit results?: Yes (fasting - 225) Did pt. bring in CBG monitor from home?: No  Lab Results  Component Value Date   HGBA1C 9.0 (H) 08/01/2023   HGBA1C 8.8 (H) 01/16/2023   HGBA1C 9.7 (H) 10/27/2022     How often do you need to have someone help you when you read instructions, pamphlets, or other written materials from your doctor or pharmacy?: 1 - Never  Interpreter Needed?: No  Information entered by :: Kandy Orris, CMA   Activities of Daily Living     11/15/2023   10:11 AM 11/13/2023    9:47 AM  In your present state of health, do you have any difficulty performing the following activities:  Hearing? 0 0  Vision? 0 0  Difficulty concentrating or making decisions? 0 0  Walking or climbing stairs? 0 0  Dressing or bathing? 0 0  Doing errands, shopping? 0 0  Preparing Food and eating ? N N  Using the Toilet? N N  In the past six months, have you accidently leaked urine? N N  Do you have problems with loss of bowel control? N N  Managing your Medications? N N  Managing your Finances? N N  Housekeeping or managing your Housekeeping? N N    Patient Care Team: Arcadio Knuckles, MD as PCP - General  (Internal Medicine) Florance Hun, MD as Consulting Physician (Ophthalmology)  Indicate any recent Medical Services you may have received from other than [facility] providers in the past year (date may be approximate).     Assessment:    This is a routine wellness examination for Kimberly [name].  Hearing/Vision screen Hearing Screening - Comments:: Denies hearing difficulties   Vision Screening - Comments:: Wears rx glasses - up to date with routine eye exams with Dr Carloyn Chi w/[facility]   Goals Addressed               This Visit's Progress     Patient Stated (pt-stated)        Patient stated she wants to get BP managed       Depression Screen     11/15/2023   10:12 AM 11/06/2023    3:14 PM 11/14/2022   10:41 AM 08/02/2022    4:01 PM 11/09/2021    9:56 AM 07/27/2021    2:30 PM 07/22/2021    1:04 PM  PHQ 2/9 Scores  PHQ - 2 Score 3 1 0 0 0 1 0  PHQ- 9 Score 9  0        Fall Risk     11/15/2023   10:11 AM 11/13/2023    9:47 AM 11/06/2023    3:14 PM 11/14/2022   10:39 AM 11/10/2022    1:17 PM  Fall Risk   Falls in the past year? 0 1 0 0 0  Comment no falls confirmed w/pt      Number falls in past yr: 0  0 0   Injury with Fall? 0  0 0   Risk for fall due to : No Fall Risks  No Fall Risks No  Fall Risks   Follow up Falls evaluation completed;Falls prevention discussed  Falls evaluation completed Falls prevention discussed     MEDICARE RISK AT HOME:  Medicare Risk at Home Any stairs in or around the home?: Yes If so, are there any without handrails?: No Home free of loose throw rugs in walkways, pet beds, electrical cords, etc?: Yes Adequate lighting in your home to reduce risk of falls?: Yes Life alert?: Yes Use of a cane, walker or w/c?: Yes Grab bars in the bathroom?: No Shower chair or bench in shower?: No Elevated toilet seat or a handicapped toilet?: No  TIMED UP AND GO:  Was the test performed?  No  Cognitive Function: 6CIT completed        11/15/2023   10:16 AM  11/14/2022   10:52 AM 11/09/2021   10:05 AM  6CIT Screen  What Year? 0 points 0 points 0 points  What month? 0 points 0 points 0 points  What time? 0 points 0 points 0 points  Count back from 20 0 points 0 points 0 points  Months in reverse 0 points 0 points 0 points  Repeat phrase 0 points 0 points 0 points  Total Score 0 points 0 points 0 points    Immunizations Immunization History  Administered Date(s) Administered   Fluad Quad(high Dose 65+) 03/16/2019   Influenza Split 05/14/2012   Influenza,inj,Quad PF,6+ Mos 07/27/2013, 03/12/2014, 04/19/2016, 03/07/2017, 03/09/2018   Influenza-Unspecified 03/18/2021, 04/13/2021   PFIZER(Purple Top)SARS-COV-2 Vaccination 08/19/2019, 09/09/2019, 05/04/2020   Pneumococcal Conjugate-13 02/04/2019   Pneumococcal Polysaccharide-23 04/30/2014, 07/22/2021   Pneumococcal-Unspecified 07/22/2021   Tdap 04/30/2014   Unspecified SARS-COV-2 Vaccination 03/11/2020   Zoster Recombinant(Shingrix ) 06/08/2022    Screening Tests Health Maintenance  Topic Date Due   Zoster Vaccines- Shingrix  (2 of 2) 08/03/2022   FOOT EXAM  10/27/2023   INFLUENZA VACCINE  01/26/2024   HEMOGLOBIN A1C  01/29/2024   DTaP/Tdap/Td (2 - Td or Tdap) 04/30/2024   OPHTHALMOLOGY EXAM  05/09/2024   Diabetic kidney evaluation - Urine ACR  07/31/2024   Diabetic kidney evaluation - eGFR measurement  08/24/2024   Medicare Annual Wellness (AWV)  11/14/2024   MAMMOGRAM  04/12/2025   Colonoscopy  07/18/2026   Pneumonia Vaccine 31+ Years old  Completed   DEXA SCAN  Completed   Hepatitis C Screening  Completed   HPV VACCINES  Aged Out   Meningococcal B Vaccine  Aged Out   COVID-19 Vaccine  Discontinued    Health Maintenance  Health Maintenance Due  Topic Date Due   Zoster Vaccines- Shingrix  (2 of 2) 08/03/2022   FOOT EXAM  10/27/2023   Health Maintenance Items Addressed:  Pt plans to get up-to-date immunization record from [facility] pharmacy - 2nd Shingles vaccine is  due  Additional Screening:  Vision Screening: Recommended annual ophthalmology exams for early detection of glaucoma and other disorders of the eye.  Dental Screening: Recommended annual dental exams for proper oral hygiene  Community Resource Referral / Chronic Care Management: CRR required this visit?  No   CCM required this visit?  No   Plan:    I have personally reviewed and noted the following in the patient's chart:   Medical and social history Use of alcohol, tobacco or illicit drugs  Current medications and supplements including opioid prescriptions. Patient is not currently taking opioid prescriptions. Functional ability and status Nutritional status Physical activity Advanced directives List of other physicians Hospitalizations, surgeries, and ER visits in previous 12 months Vitals Screenings  to include cognitive, depression, and falls Referrals and appointments  In addition, I have reviewed and discussed with patient certain preventive protocols, quality metrics, and best practice recommendations. A written personalized care plan for preventive services as well as general preventive health recommendations were provided to patient.   Patria Bookbinder, CMA   11/15/2023   After Visit Summary: (In Person-Declined) Patient declined AVS at this time.  Notes: Nothing significant to report at this time.

## 2023-11-16 ENCOUNTER — Ambulatory Visit (INDEPENDENT_AMBULATORY_CARE_PROVIDER_SITE_OTHER): Admitting: Emergency Medicine

## 2023-11-16 ENCOUNTER — Encounter: Payer: Self-pay | Admitting: Emergency Medicine

## 2023-11-16 ENCOUNTER — Other Ambulatory Visit: Payer: Self-pay | Admitting: Family Medicine

## 2023-11-16 VITALS — BP 138/74 | Temp 98.7°F | Ht 71.0 in | Wt 187.6 lb

## 2023-11-16 DIAGNOSIS — M25471 Effusion, right ankle: Secondary | ICD-10-CM

## 2023-11-16 DIAGNOSIS — M25472 Effusion, left ankle: Secondary | ICD-10-CM

## 2023-11-16 DIAGNOSIS — E119 Type 2 diabetes mellitus without complications: Secondary | ICD-10-CM

## 2023-11-16 DIAGNOSIS — R0982 Postnasal drip: Secondary | ICD-10-CM

## 2023-11-16 DIAGNOSIS — Z794 Long term (current) use of insulin: Secondary | ICD-10-CM

## 2023-11-16 DIAGNOSIS — I10 Essential (primary) hypertension: Secondary | ICD-10-CM

## 2023-11-16 DIAGNOSIS — R110 Nausea: Secondary | ICD-10-CM | POA: Diagnosis not present

## 2023-11-16 MED ORDER — ONDANSETRON 4 MG PO TBDP: 4.0000 mg | ORAL_TABLET | Freq: Three times a day (TID) | ORAL | 0 refills | Status: AC | PRN

## 2023-11-16 NOTE — Assessment & Plan Note (Signed)
 Much improved today per patient. Amlodipine  contributing Takes furosemide  40 mg daily

## 2023-11-16 NOTE — Assessment & Plan Note (Signed)
 Most likely secondary to GLP 1 agonist therapy which was recently started Recommend Zofran ODT as needed Continue Ozempic  0.5 mg weekly as prescribed by PCP/nephrologist Diet and nutrition discussed Needs to follow-up with PCP as scheduled in 2 weeks

## 2023-11-16 NOTE — Progress Notes (Signed)
 Kimberly [name] 68 y.o.   Chief Complaint  Patient presents with   Nausea    Pt is taking ozempic  Pt is also tahing furosemide        HISTORY OF PRESENT ILLNESS: Acute problem visit today.  Patient of Dr. Sandra Crouch This is a 68 y.o. female complaining of nausea.  Was recently started on Ozempic  last April.  Increased dose to 0.5 mg weekly after 3 weeks.  Nausea started after starting medication.  Sees nephrologist on a regular basis. Also complaining of intermittent ankle edema.  Patient has hypertension and diabetes.  Presently taking amlodipine  10 mg daily. Has no other complaints or medical concerns today.  HPI   Prior to Admission medications   Medication Sig Start Date End Date Taking? Authorizing Provider  albuterol  (VENTOLIN  HFA) 108 (90 Base) MCG/ACT inhaler Inhale 2 puffs into the lungs every 4 (four) hours as needed. ADDITIONAL REFILLS FROM PCP 03/15/22  Yes Maudine Sos, MD  allopurinol  (ZYLOPRIM ) 100 MG tablet TAKE 1 TABLET BY MOUTH DAILY TO PREVENT GOUT 08/01/23  Yes Corey, Evan S, MD  amLODipine  (NORVASC ) 10 MG tablet Take 1 tablet (10 mg total) by mouth daily. 10/11/23  Yes Maudine Sos, MD  aspirin  EC 81 MG tablet Take 1 tablet (81 mg total) by mouth daily. Swallow whole. 08/02/23  Yes Arcadio Knuckles, MD  Blood Glucose Monitoring Suppl (CONTOUR NEXT MONITOR) w/Device KIT Use to test blood sugars 4 times daily. Dx: e11.9 05/08/20  Yes [place], Betty G, MD  colchicine  0.6 MG tablet Take 0.6 mg by mouth daily. By Garlan Juniper - take as need for gout flare up   Yes [provider]  furosemide  (LASIX ) 40 MG tablet Take 40 mg 2 tablets daily for 5 days then decrease to 1 tablet daily. 10/20/23  Yes Maudine Sos, MD  glucose blood (CONTOUR NEXT TEST) test strip USE TO TEST BLOOD SUGARS 4 TIMES A DAY 12/01/22  Yes Arcadio Knuckles, MD  Insulin  Pen Needle (BD PEN NEEDLE NANO 2ND GEN) 32G X 4 MM MISC use as directed 07/20/23  Yes Arcadio Knuckles, MD  KERENDIA  20 MG  TABS Take 1 tablet by mouth daily. 09/04/23  Yes [provider]  linaclotide (LINZESS) 290 MCG CAPS capsule Take 290 mcg by mouth daily before breakfast.   Yes [provider]  losartan  (COZAAR ) 100 MG tablet Take 1 tablet (100 mg total) by mouth daily. 10/20/23  Yes Maudine Sos, MD  Microlet Lancets MISC USE TO CHECK BLOOD SUGARS 4 TIMES A DAY 12/01/22  Yes Arcadio Knuckles, MD  ondansetron (ZOFRAN-ODT) 4 MG disintegrating tablet Take 1 tablet (4 mg total) by mouth every 8 (eight) hours as needed for nausea or vomiting. 11/16/23  Yes Trei Schoch, Isidro Margo, MD  OZEMPIC , 0.25 OR 0.5 MG/DOSE, 2 MG/3ML SOPN Inject 0.25 mg into the skin once a week. 10/24/23  Yes [provider]  zinc  gluconate 50 MG tablet 1 BY MOUTH DAILY 08/28/23  Yes Arcadio Knuckles, MD  cetirizine  (ZYRTEC ) 10 MG tablet TAKE 1 TABLET BY MOUTH DAILY 11/16/23   Luevenia Saha, MD    Allergies  Allergen Reactions   Semaglutide (0.25 Or 0.5mg -Dos) Nausea Only   Ace Inhibitors Cough   Rosuvastatin      Muscle cramps    Patient Active Problem List   Diagnosis Date Noted   Edema of both lower legs 08/01/2023   Chronic idiopathic constipation 08/01/2023   Elevated d-dimer 08/01/2023   Claudication in peripheral vascular  disease (HCC) 10/27/2022   PAD (peripheral artery disease) (HCC) 10/27/2022   Primary osteoarthritis of right knee 09/06/2022   Multiple gallstones 08/02/2022   Anemia in stage 3b chronic kidney disease (HCC) 06/09/2022   Insulin -requiring or dependent type II diabetes mellitus (HCC) 06/09/2022   Carotid bruit 03/21/2022   Ovarian mass, left 02/09/2022   Abnormal electrocardiogram (ECG) (EKG) 02/09/2022   Anemia due to zinc  deficiency 07/25/2021   Hyperlipidemia LDL goal <100 07/22/2021   Diabetic nephropathy associated with type 2 diabetes mellitus (HCC) 07/22/2021   Irritable bowel syndrome with constipation 07/22/2021   Encounter for general adult medical examination with abnormal  findings 07/22/2021   Lower extremity edema 01/06/2021   Myalgia due to statin 02/02/2018   Cough variant asthma 10/05/2016   Cataract, left eye 10/17/2014   GERD (gastroesophageal reflux disease) 04/15/2014   Anxiety disorder 04/02/2014   Essential hypertension 05/16/2012   Overweight (BMI 25.0-29.9) 05/16/2012    Past Medical History:  Diagnosis Date   Arthritis    Carotid bruit 03/21/2022   Cataract    OD   Chronic kidney disease    Diabetes mellitus 2001   Hyperlipidemia Dx 2012   Lower extremity edema 01/06/2021    Past Surgical History:  Procedure Laterality Date   CATARACT EXTRACTION Left    EYE SURGERY Left    Cat Sx   VAGINAL HYSTERECTOMY     AUB (surgery in [place]), prior to 2000    Social History   Socioeconomic History   Marital status: Divorced    Spouse name: Not on file   Number of children: Not on file   Years of education: Not on file   Highest education level: Bachelor's degree (e.g., BA, AB, BS)  Occupational History   Occupation: retired  Tobacco Use   Smoking status: Never    Passive exposure: Never   Smokeless tobacco: Never  Vaping Use   Vaping status: Never Used  Substance and Sexual Activity   Alcohol use: Yes    Alcohol/week: 4.0 standard drinks of alcohol    Types: 4 Glasses of wine per week    Comment: occ   Drug use: No   Sexual activity: Not Currently    Partners: Female  Other Topics Concern   Not on file  Social History Narrative   Not on file   Social Drivers of Health   Financial Resource Strain: Medium Risk (11/15/2023)   Overall Financial Resource Strain (CARDIA)    Difficulty of Paying Living Expenses: Somewhat hard  Food Insecurity: Food Insecurity Present (11/15/2023)   Hunger Vital Sign    Worried About Running Out of Food in the Last Year: Sometimes true    Ran Out of Food in the Last Year: Sometimes true  Transportation Needs: No Transportation Needs (11/15/2023)   PRAPARE - Scientist, research (physical sciences) (Medical): No    Lack of Transportation (Non-Medical): No  Physical Activity: Sufficiently Active (11/15/2023)   Exercise Vital Sign    Days of Exercise per Week: 4 days    Minutes of Exercise per Session: 100 min  Stress: Stress Concern Present (11/15/2023)   [employer] of Occupational Health - Occupational Stress Questionnaire    Feeling of Stress : Rather much  Social Connections: Moderately Integrated (11/15/2023)   Social Connection and Isolation Panel [NHANES]    Frequency of Communication with Friends and Family: More than three times a week    Frequency of Social Gatherings with Friends and Family: Once a  week    Attends Religious Services: More than 4 times per year    Active Member of Clubs or Organizations: Yes    Attends Banker Meetings: More than 4 times per year    Marital Status: Divorced  Intimate Partner Violence: Not At Risk (11/15/2023)   Humiliation, Afraid, Rape, and Kick questionnaire    Fear of Current or Ex-Partner: No    Emotionally Abused: No    Physically Abused: No    Sexually Abused: No    Family History  Problem Relation Age of Onset   Depression Mother    Hypertension Mother    Diabetes Mother    Heart disease Father    Heart attack Father 70   Uterine cancer Sister    Breast cancer Sister    Lung cancer Brother    Prostate cancer Neg Hx    Endometrial cancer Neg Hx    Ovarian cancer Neg Hx    Colon cancer Neg Hx    Pancreatic cancer Neg Hx      Review of Systems  Constitutional: Negative.  Negative for chills and fever.  HENT: Negative.  Negative for congestion and sore throat.   Respiratory: Negative.  Negative for cough and shortness of breath.   Cardiovascular:  Positive for leg swelling. Negative for chest pain and palpitations.  Gastrointestinal:  Positive for nausea. Negative for abdominal pain, diarrhea and vomiting.  Skin: Negative.  Negative for rash.  Neurological: Negative.  Negative for  dizziness and headaches.  All other systems reviewed and are negative.   Vitals:   11/16/23 0938 11/16/23 0959  BP: (!) 140/70 138/74  Temp: 98.7 F (37.1 C)     Physical Exam Vitals reviewed.  Constitutional:      Appearance: Normal appearance.  HENT:     Head: Normocephalic.     Mouth/Throat:     Mouth: Mucous membranes are moist.     Pharynx: Oropharynx is clear.  Eyes:     Extraocular Movements: Extraocular movements intact.     Pupils: Pupils are equal, round, and reactive to light.  Cardiovascular:     Rate and Rhythm: Normal rate and regular rhythm.     Pulses: Normal pulses.     Heart sounds: Normal heart sounds.  Pulmonary:     Effort: Pulmonary effort is normal.     Breath sounds: Normal breath sounds.  Musculoskeletal:     Cervical back: No tenderness.     Comments: Bilateral ankle edema  Lymphadenopathy:     Cervical: No cervical adenopathy.  Skin:    General: Skin is warm and dry.     Capillary Refill: Capillary refill takes less than 2 seconds.  Neurological:     General: No focal deficit present.     Mental Status: She is alert and oriented to person, place, and time.  Psychiatric:        Mood and Affect: Mood normal.        Behavior: Behavior normal.      ASSESSMENT & PLAN: A total of 33 minutes was spent with the patient and counseling/coordination of care regarding preparing for this visit, review of most recent office visit notes, review of multiple chronic medical conditions and their management, differential diagnosis of nausea and management, review of all medications, review of most recent bloodwork results, review of health maintenance items, education on nutrition, prognosis, documentation, and need for follow up.  Problem List Items Addressed This Visit       Cardiovascular  and Mediastinum   Essential hypertension   BP Readings from Last 3 Encounters:  11/16/23 138/74  11/15/23 (!) 168/62  11/06/23 (!) 164/78  Continue to track  blood pressure at home. Continue amlodipine , chlorthalidone , and losartan . Continue with exercise with a goal of at least 150 minutes weekly.          Endocrine   Insulin -requiring or dependent type II diabetes mellitus (HCC)   Recently switched from insulin  to Ozempic  by her nephrologist        Other   Ankle edema, bilateral   Much improved today per patient. Amlodipine  contributing Takes furosemide  40 mg daily      Nausea - Primary   Most likely secondary to GLP 1 agonist therapy which was recently started Recommend Zofran ODT as needed Continue Ozempic  0.5 mg weekly as prescribed by PCP/nephrologist Diet and nutrition discussed Needs to follow-up with PCP as scheduled in 2 weeks      Relevant Medications   ondansetron (ZOFRAN-ODT) 4 MG disintegrating tablet   Patient Instructions  Nausea, Adult Nausea is feeling like you may vomit. Feeling like you may vomit is usually not serious, but it may be an early sign of a more serious medical problem. Vomiting is when stomach contents forcefully come out of your mouth. If you vomit, or if you are not able to drink enough fluids, you may not have enough water in your body (get dehydrated). If you do not have enough water in your body, you may: Feel tired. Feel thirsty. Have a dry mouth. Have cracked lips. Pee (urinate) less often. Older adults and people who have other diseases or a weak body defense system (immune system) have a higher risk of not having enough water in the body. The main goals of treating this condition are: To relieve your nausea. To ensure your nausea occurs less often. To prevent vomiting and losing too much fluid. Follow these instructions at home: Watch your symptoms for any changes. Tell your doctor about them. Eating and drinking     Take an ORS (oral rehydration solution). This is a drink that is sold at pharmacies and stores. Drink clear fluids in small amounts as you are able. These  include: Water. Ice chips. Fruit juice that has water added (diluted fruit juice). Low-calorie sports drinks. Eat bland, easy-to-digest foods in small amounts as you are able, such as: Bananas. Applesauce. Rice. Low-fat (lean) meats. Toast. Crackers. Avoid drinking fluids that have a lot of sugar or caffeine in them. This includes energy drinks, sports drinks, and soda. Avoid alcohol. Avoid spicy or fatty foods. General instructions Take over-the-counter and prescription medicines only as told by your doctor. Rest at home while you get better. Drink enough fluid to keep your pee (urine) pale yellow. Take slow and deep breaths when you feel like you may vomit. Avoid food or things that have strong smells. Wash your hands often with soap and water for at least 20 seconds. If you cannot use soap and water, use hand sanitizer. Make sure that everyone in your home washes their hands well and often. Keep all follow-up visits. Contact a doctor if: You feel worse. You feel like you may vomit and this lasts for more than 2 days. You vomit. You are not able to drink fluids without vomiting. You have new symptoms. You have a fever. You have a headache. You have muscle cramps. You have a rash. You have pain while peeing. You feel light-headed or dizzy. Get help right away  if: You have pain in your chest, neck, arm, or jaw. You feel very weak or you faint. You have vomit that is bright red or looks like coffee grounds. You have bloody or black poop (stools) or poop that looks like tar. You have a very bad headache, a stiff neck, or both. You have very bad pain, cramping, or bloating in your belly (abdomen). You have trouble breathing or you are breathing very quickly. Your heart is beating very quickly. Your skin feels cold and clammy. You feel confused. You have signs of losing too much water in your body, such as: Dark pee, very little pee, or no pee. Cracked lips. Dry  mouth. Sunken eyes. Sleepiness. Weakness. These symptoms may be an emergency. Get help right away. Call 911. Do not wait to see if the symptoms will go away. Do not drive yourself to the hospital. Summary Nausea is feeling like you are about vomit. If you vomit, or if you are not able to drink enough fluids, you may not have enough water in your body (get dehydrated). Eat and drink what your doctor tells you. Take over-the-counter and prescription medicines only as told by your doctor. Contact a doctor right away if your symptoms get worse or you have new symptoms. Keep all follow-up visits. This information is not intended to replace advice given to you by your health care provider. Make sure you discuss any questions you have with your health care provider. Document Revised: 12/18/2020 Document Reviewed: 12/18/2020 Elsevier Patient Education  2024 Elsevier Inc.      Maryagnes Small, MD [facility]

## 2023-11-16 NOTE — Patient Instructions (Signed)
Nausea, Adult Nausea is feeling like you may vomit. Feeling like you may vomit is usually not serious, but it may be an early sign of a more serious medical problem. Vomiting is when stomach contents forcefully come out of your mouth. If you vomit, or if you are not able to drink enough fluids, you may not have enough water in your body (get dehydrated). If you do not have enough water in your body, you may: Feel tired. Feel thirsty. Have a dry mouth. Have cracked lips. Pee (urinate) less often. Older adults and people who have other diseases or a weak body defense system (immune system) have a higher risk of not having enough water in the body. The main goals of treating this condition are: To relieve your nausea. To ensure your nausea occurs less often. To prevent vomiting and losing too much fluid. Follow these instructions at home: Watch your symptoms for any changes. Tell your doctor about them. Eating and drinking     Take an ORS (oral rehydration solution). This is a drink that is sold at pharmacies and stores. Drink clear fluids in small amounts as you are able. These include: Water. Ice chips. Fruit juice that has water added (diluted fruit juice). Low-calorie sports drinks. Eat bland, easy-to-digest foods in small amounts as you are able, such as: Bananas. Applesauce. Rice. Low-fat (lean) meats. Toast. Crackers. Avoid drinking fluids that have a lot of sugar or caffeine in them. This includes energy drinks, sports drinks, and soda. Avoid alcohol. Avoid spicy or fatty foods. General instructions Take over-the-counter and prescription medicines only as told by your doctor. Rest at home while you get better. Drink enough fluid to keep your pee (urine) pale yellow. Take slow and deep breaths when you feel like you may vomit. Avoid food or things that have strong smells. Wash your hands often with soap and water for at least 20 seconds. If you cannot use soap and water,  use hand sanitizer. Make sure that everyone in your home washes their hands well and often. Keep all follow-up visits. Contact a doctor if: You feel worse. You feel like you may vomit and this lasts for more than 2 days. You vomit. You are not able to drink fluids without vomiting. You have new symptoms. You have a fever. You have a headache. You have muscle cramps. You have a rash. You have pain while peeing. You feel light-headed or dizzy. Get help right away if: You have pain in your chest, neck, arm, or jaw. You feel very weak or you faint. You have vomit that is bright red or looks like coffee grounds. You have bloody or black poop (stools) or poop that looks like tar. You have a very bad headache, a stiff neck, or both. You have very bad pain, cramping, or bloating in your belly (abdomen). You have trouble breathing or you are breathing very quickly. Your heart is beating very quickly. Your skin feels cold and clammy. You feel confused. You have signs of losing too much water in your body, such as: Dark pee, very little pee, or no pee. Cracked lips. Dry mouth. Sunken eyes. Sleepiness. Weakness. These symptoms may be an emergency. Get help right away. Call 911. Do not wait to see if the symptoms will go away. Do not drive yourself to the hospital. Summary Nausea is feeling like you are about vomit. If you vomit, or if you are not able to drink enough fluids, you may not have enough water in   your body (get dehydrated). Eat and drink what your doctor tells you. Take over-the-counter and prescription medicines only as told by your doctor. Contact a doctor right away if your symptoms get worse or you have new symptoms. Keep all follow-up visits. This information is not intended to replace advice given to you by your health care provider. Make sure you discuss any questions you have with your health care provider. Document Revised: 12/18/2020 Document Reviewed:  12/18/2020 Elsevier Patient Education  2024 Elsevier Inc.  

## 2023-11-16 NOTE — Assessment & Plan Note (Signed)
 Recently switched from insulin  to Ozempic  by her nephrologist

## 2023-11-16 NOTE — Assessment & Plan Note (Addendum)
 BP Readings from Last 3 Encounters:  11/16/23 138/74  11/15/23 (!) 168/62  11/06/23 (!) 164/78  Continue to track blood pressure at home. Continue amlodipine , chlorthalidone , and losartan . Continue with exercise with a goal of at least 150 minutes weekly.

## 2023-11-22 ENCOUNTER — Other Ambulatory Visit: Payer: Self-pay | Admitting: Internal Medicine

## 2023-11-22 DIAGNOSIS — D538 Other specified nutritional anemias: Secondary | ICD-10-CM

## 2023-12-01 ENCOUNTER — Ambulatory Visit (HOSPITAL_BASED_OUTPATIENT_CLINIC_OR_DEPARTMENT_OTHER): Admitting: Cardiovascular Disease

## 2023-12-06 ENCOUNTER — Encounter: Payer: Self-pay | Admitting: Internal Medicine

## 2023-12-06 ENCOUNTER — Ambulatory Visit (INDEPENDENT_AMBULATORY_CARE_PROVIDER_SITE_OTHER): Admitting: Internal Medicine

## 2023-12-06 VITALS — BP 132/78 | HR 85 | Temp 98.8°F | Resp 16 | Ht 71.0 in | Wt 185.0 lb

## 2023-12-06 DIAGNOSIS — N1832 Chronic kidney disease, stage 3b: Secondary | ICD-10-CM | POA: Diagnosis not present

## 2023-12-06 DIAGNOSIS — E785 Hyperlipidemia, unspecified: Secondary | ICD-10-CM

## 2023-12-06 DIAGNOSIS — E119 Type 2 diabetes mellitus without complications: Secondary | ICD-10-CM

## 2023-12-06 DIAGNOSIS — Z0000 Encounter for general adult medical examination without abnormal findings: Secondary | ICD-10-CM | POA: Diagnosis not present

## 2023-12-06 DIAGNOSIS — T466X5A Adverse effect of antihyperlipidemic and antiarteriosclerotic drugs, initial encounter: Secondary | ICD-10-CM

## 2023-12-06 DIAGNOSIS — Z23 Encounter for immunization: Secondary | ICD-10-CM

## 2023-12-06 DIAGNOSIS — Z0001 Encounter for general adult medical examination with abnormal findings: Secondary | ICD-10-CM

## 2023-12-06 DIAGNOSIS — D631 Anemia in chronic kidney disease: Secondary | ICD-10-CM

## 2023-12-06 DIAGNOSIS — D538 Other specified nutritional anemias: Secondary | ICD-10-CM

## 2023-12-06 DIAGNOSIS — K5904 Chronic idiopathic constipation: Secondary | ICD-10-CM | POA: Diagnosis not present

## 2023-12-06 DIAGNOSIS — I10 Essential (primary) hypertension: Secondary | ICD-10-CM

## 2023-12-06 DIAGNOSIS — Z794 Long term (current) use of insulin: Secondary | ICD-10-CM | POA: Diagnosis not present

## 2023-12-06 DIAGNOSIS — M791 Myalgia, unspecified site: Secondary | ICD-10-CM

## 2023-12-06 DIAGNOSIS — N184 Chronic kidney disease, stage 4 (severe): Secondary | ICD-10-CM

## 2023-12-06 DIAGNOSIS — K5730 Diverticulosis of large intestine without perforation or abscess without bleeding: Secondary | ICD-10-CM | POA: Insufficient documentation

## 2023-12-06 MED ORDER — SHINGRIX 50 MCG/0.5ML IM SUSR: 0.5000 mL | Freq: Once | INTRAMUSCULAR | 1 refills | Status: AC

## 2023-12-06 MED ORDER — LINACLOTIDE 145 MCG PO CAPS: 145.0000 ug | ORAL_CAPSULE | Freq: Every day | ORAL | Status: DC

## 2023-12-06 NOTE — Patient Instructions (Signed)

## 2023-12-06 NOTE — Progress Notes (Signed)
 Subjective:  Patient ID: Kimberly [name], female    DOB: September 28, 1955  Age: 68 y.o. MRN: [id number]  CC: Annual Exam, Hypertension, Diabetes, Hyperlipidemia, and Anemia   HPI Kimberly [name] presents for a CPX and f/up ----  Discussed the use of AI scribe software for clinical note transcription with the patient, who gave verbal consent to proceed.  History of Present Illness   Kimberly [name] is a 68 year old female who presents with abdominal pain and constipation.  She experiences abdominal pain, which she associates with the initiation of Ozempic . The pain has resolved after discontinuing the medication and taking medication for an upset stomach. No current nausea or vomiting, but she mentions feeling nauseous some mornings. No trouble swallowing or painful swallowing.  She has a history of constipation, ongoing since last year, prior to starting Ozempic . Bowel movements are infrequent, occurring every eight to nine days. She wants daily bowel movements as she used to have. Various treatments have been tried, including melatonin and Linzess , but she stopped Linzess  due to concerns about stomach pain. Currently, she takes a half dose of loxapine and has resumed taking Lysine, which she believes helps with bowel movements.  She has a history of elevated amylase and lipase levels, which prompted a recommendation to see her gastroenterologist. A past colonoscopy showed no polyps, and she was advised to wait until 2026 for the next one. Thyroid  function tests were normal.  No chest pain, shortness of breath, dizziness, or lightheadedness. She experiences 'lockjaw cramps' and takes Tylenol  for pain relief. She is under the care of a cardiologist and a kidney doctor, who prescribed Ozempic . Currently on a 0.5% dose of Ozempic , she notes significant weight loss, which she is not pleased with.  She has a mass on her ovary, referred to as a tumor, and expresses concern about potential surgery.  Blood sugar levels have been higher since starting Ozempic , and she is not taking any other medications for blood sugar control, having stopped Lantus  and refusing metformin  due to adverse effects.  Socially, she mentions visiting her hometown, [address], and going to the gym.       Outpatient Medications Prior to Visit  Medication Sig Dispense Refill   albuterol  (VENTOLIN  HFA) 108 (90 Base) MCG/ACT inhaler Inhale 2 puffs into the lungs every 4 (four) hours as needed. ADDITIONAL REFILLS FROM PCP 1 each 0   allopurinol  (ZYLOPRIM ) 100 MG tablet TAKE 1 TABLET BY MOUTH DAILY TO PREVENT GOUT 90 tablet 1   amLODipine  (NORVASC ) 10 MG tablet Take 1 tablet (10 mg total) by mouth daily. 90 tablet 0   aspirin  EC 81 MG tablet Take 1 tablet (81 mg total) by mouth daily. Swallow whole. 90 tablet 1   Blood Glucose Monitoring Suppl (CONTOUR NEXT MONITOR) w/Device KIT Use to test blood sugars 4 times daily. Dx: e11.9 1 kit 1   cetirizine  (ZYRTEC ) 10 MG tablet TAKE 1 TABLET BY MOUTH DAILY 30 tablet 0   colchicine  0.6 MG tablet Take 0.6 mg by mouth daily. By Garlan Juniper - take as need for gout flare up     furosemide  (LASIX ) 40 MG tablet Take 40 mg 2 tablets daily for 5 days then decrease to 1 tablet daily. 40 tablet 3   KERENDIA  20 MG TABS Take 1 tablet by mouth daily.     losartan  (COZAAR ) 100 MG tablet Take 1 tablet (100 mg total) by mouth daily. 90 tablet 1   Microlet Lancets MISC USE TO  CHECK BLOOD SUGARS 4 TIMES A DAY 400 each 12   ondansetron  (ZOFRAN -ODT) 4 MG disintegrating tablet Take 1 tablet (4 mg total) by mouth every 8 (eight) hours as needed for nausea or vomiting. 20 tablet 0   OZEMPIC , 0.25 OR 0.5 MG/DOSE, 2 MG/3ML SOPN Inject 0.25 mg into the skin once a week.     zinc  gluconate 50 MG tablet 1 BY MOUTH DAILY 90 tablet 0   glucose blood (CONTOUR NEXT TEST) test strip USE TO TEST BLOOD SUGARS 4 TIMES A DAY 400 strip 2   linaclotide  (LINZESS ) 290 MCG CAPS capsule Take 290 mcg by mouth daily  before breakfast.     Insulin  Pen Needle (BD PEN NEEDLE NANO 2ND GEN) 32G X 4 MM MISC use as directed 30 each 0   No facility-administered medications prior to visit.    ROS Review of Systems  Constitutional:  Negative for appetite change, chills, diaphoresis, fatigue and fever.  HENT: Negative.    Eyes: Negative.   Respiratory:  Negative for cough, chest tightness, shortness of breath and wheezing.   Cardiovascular:  Negative for chest pain, palpitations and leg swelling.  Gastrointestinal:  Positive for abdominal pain and constipation. Negative for blood in stool, diarrhea, nausea and vomiting.  Endocrine: Negative.   Genitourinary: Negative.  Negative for difficulty urinating, dysuria and hematuria.  Musculoskeletal:  Positive for myalgias. Negative for arthralgias.  Neurological:  Negative for dizziness, weakness and light-headedness.  Hematological:  Negative for adenopathy. Does not bruise/bleed easily.  Psychiatric/Behavioral: Negative.      Objective:  BP 132/78 (BP Location: Left Arm, Patient Position: Sitting, Cuff Size: Normal)   Pulse 85   Temp 98.8 F (37.1 C) (Temporal)   Resp 16   Ht 5' 11 (1.803 m)   Wt 185 lb (83.9 kg)   SpO2 97%   BMI 25.80 kg/m   BP Readings from Last 3 Encounters:  12/06/23 132/78  11/16/23 138/74  11/15/23 (!) 168/62    Wt Readings from Last 3 Encounters:  12/06/23 185 lb (83.9 kg)  11/16/23 187 lb 9.6 oz (85.1 kg)  11/15/23 190 lb (86.2 kg)    Physical Exam Vitals reviewed.  Constitutional:      Appearance: Normal appearance.  HENT:     Nose: Nose normal.     Mouth/Throat:     Mouth: Mucous membranes are moist.   Eyes:     General: No scleral icterus.    Conjunctiva/sclera: Conjunctivae normal.    Cardiovascular:     Rate and Rhythm: Normal rate and regular rhythm.     Heart sounds: No murmur heard. Pulmonary:     Effort: Pulmonary effort is normal.     Breath sounds: No stridor. No wheezing, rhonchi or rales.   Abdominal:     General: Abdomen is flat.     Palpations: There is no mass.     Tenderness: There is no abdominal tenderness. There is no guarding.     Hernia: No hernia is present.   Musculoskeletal:        General: Normal range of motion.     Cervical back: Neck supple.     Right lower leg: No edema.     Left lower leg: No edema.  Lymphadenopathy:     Cervical: No cervical adenopathy.   Skin:    General: Skin is warm and dry.   Neurological:     General: No focal deficit present.     Mental Status: She is alert. Mental status  is at baseline.   Psychiatric:        Mood and Affect: Mood normal.        Behavior: Behavior normal.     Lab Results  Component Value Date   WBC 4.9 12/06/2023   HGB 11.4 (L) 12/06/2023   HCT 36.1 12/06/2023   PLT 194 12/06/2023   GLUCOSE 291 (H) 12/06/2023   CHOL 325 (H) 12/06/2023   TRIG 469 (H) 12/06/2023   HDL 39 (L) 12/06/2023   LDLDIRECT 162.0 10/27/2022   LDLCALC  12/06/2023     Comment:     . LDL cholesterol not calculated. Triglyceride levels greater than 400 mg/dL invalidate calculated LDL results. . Reference range: <100 . Desirable range <100 mg/dL for primary prevention;   <70 mg/dL for patients with CHD or diabetic patients  with > or = 2 CHD risk factors. [name] LDL-C is now calculated using the Martin-Hopkins  calculation, which is a validated novel method providing  better accuracy than the Friedewald equation in the  estimation of LDL-C.  Melinda Sprawls et al. Erroll Heard. 4098;119(14): 2061-2068  (http://education.[facility].com/faq/FAQ164)    ALT 17 08/01/2023   AST 21 08/01/2023   NA 134 (L) 12/06/2023   K 5.1 12/06/2023   CL 101 12/06/2023   CREATININE 2.25 (H) 12/06/2023   BUN 52 (H) 12/06/2023   CO2 23 12/06/2023   TSH 1.78 08/01/2023   HGBA1C 10.1 (H) 12/06/2023   MICROALBUR 70.2 (H) 08/01/2023    MM 3D SCREENING MAMMOGRAM BILATERAL BREAST Result Date: 04/17/2023 CLINICAL DATA:  Screening. EXAM: DIGITAL  SCREENING BILATERAL MAMMOGRAM WITH TOMOSYNTHESIS AND CAD TECHNIQUE: Bilateral screening digital craniocaudal and mediolateral oblique mammograms were obtained. Bilateral screening digital breast tomosynthesis was performed. The images were evaluated with computer-aided detection. COMPARISON:  Previous exam(s). ACR Breast Density Category c: The breasts are heterogeneously dense, which may obscure small masses. FINDINGS: There are no findings suspicious for malignancy. IMPRESSION: No mammographic evidence of malignancy. A result letter of this screening mammogram will be mailed directly to the patient. RECOMMENDATION: Screening mammogram in one year. (Code:SM-B-01Y) BI-RADS CATEGORY  1: Negative. Electronically Signed   By: Roda Cirri M.D.   On: 04/17/2023 15:33    Assessment & Plan:  Insulin -requiring or dependent type II diabetes mellitus (HCC)- A1C is up to 10.1%. Will start basal insulin . -     HM Diabetes Foot Exam -     Hemoglobin A1c; Future -     Basic metabolic panel with GFR; Future -     AMB Referral VBCI Care Management -     Toujeo  Max SoloStar; Inject 30 Units into the skin daily.  Dispense: 9 mL; Refill: 0 -     Contour Next Test; USE TO TEST BLOOD SUGARS 4 TIMES A DAY  Dispense: 400 strip; Refill: 2 -     Insulin  Pen Needle; 1 Act by Does not apply route daily.  Dispense: 100 each; Refill: 1  Chronic idiopathic constipation -     Magnesium ; Future -     linaCLOtide ; Take 1 capsule (145 mcg total) by mouth daily before breakfast. -     Basic metabolic panel with GFR; Future  Anemia in stage 3b chronic kidney disease (HCC) -     CBC with Differential/Platelet; Future -     Vitamin B12; Future -     Vitamin B1; Future -     Folate; Future -     Reticulocytes; Future -     Iron, TIBC and Ferritin Panel; Future  Anemia due to zinc  deficiency -     CBC with Differential/Platelet; Future  Hyperlipidemia LDL goal <100 -     Lipid panel; Future -     CK; Future  Essential  hypertension -     Basic metabolic panel with GFR; Future  Encounter for general adult medical examination with abnormal findings- Exam completed, labs reviewed, vaccines reviewed and updated, cancer screenings are UTD, pt ed material was given.   Need for prophylactic vaccination and inoculation against varicella -     Shingrix ; Inject 0.5 mLs into the muscle once for 1 dose.  Dispense: 0.5 mL; Refill: 1  Myalgia due to statin  Stage 4 chronic kidney disease (HCC) -     AMB Referral VBCI Care Management -     Ambulatory referral to Nephrology     Follow-up: Return in about 6 months (around 06/06/2024).  Sandra Crouch, MD

## 2023-12-11 ENCOUNTER — Ambulatory Visit: Payer: Self-pay | Admitting: Internal Medicine

## 2023-12-11 DIAGNOSIS — Z23 Encounter for immunization: Secondary | ICD-10-CM | POA: Insufficient documentation

## 2023-12-11 DIAGNOSIS — N184 Chronic kidney disease, stage 4 (severe): Secondary | ICD-10-CM | POA: Insufficient documentation

## 2023-12-11 LAB — CBC WITH DIFFERENTIAL/PLATELET
Absolute Lymphocytes: 1201 {cells}/uL (ref 850–3900)
Absolute Monocytes: 475 {cells}/uL (ref 200–950)
Basophils Absolute: 10 {cells}/uL (ref 0–200)
Basophils Relative: 0.2 %
Eosinophils Absolute: 284 {cells}/uL (ref 15–500)
Eosinophils Relative: 5.8 %
HCT: 36.1 % (ref 35.0–45.0)
Hemoglobin: 11.4 g/dL — ABNORMAL LOW (ref 11.7–15.5)
MCH: 29.1 pg (ref 27.0–33.0)
MCHC: 31.6 g/dL — ABNORMAL LOW (ref 32.0–36.0)
MCV: 92.1 fL (ref 80.0–100.0)
MPV: 12.1 fL (ref 7.5–12.5)
Monocytes Relative: 9.7 %
Neutro Abs: 2930 {cells}/uL (ref 1500–7800)
Neutrophils Relative %: 59.8 %
Platelets: 194 10*3/uL (ref 140–400)
RBC: 3.92 10*6/uL (ref 3.80–5.10)
RDW: 13.8 % (ref 11.0–15.0)
Total Lymphocyte: 24.5 %
WBC: 4.9 10*3/uL (ref 3.8–10.8)

## 2023-12-11 LAB — IRON,TIBC AND FERRITIN PANEL
%SAT: 21 % (ref 16–45)
Ferritin: 231 ng/mL (ref 16–288)
Iron: 69 ug/dL (ref 45–160)
TIBC: 321 ug/dL (ref 250–450)

## 2023-12-11 LAB — BASIC METABOLIC PANEL WITH GFR
BUN/Creatinine Ratio: 23 (calc) — ABNORMAL HIGH (ref 6–22)
BUN: 52 mg/dL — ABNORMAL HIGH (ref 7–25)
CO2: 23 mmol/L (ref 20–32)
Calcium: 9.8 mg/dL (ref 8.6–10.4)
Chloride: 101 mmol/L (ref 98–110)
Creat: 2.25 mg/dL — ABNORMAL HIGH (ref 0.60–1.00)
Glucose, Bld: 291 mg/dL — ABNORMAL HIGH (ref 65–99)
Potassium: 5.1 mmol/L (ref 3.5–5.3)
Sodium: 134 mmol/L — ABNORMAL LOW (ref 135–146)
eGFR: 23 mL/min/{1.73_m2} — ABNORMAL LOW (ref >=60)

## 2023-12-11 LAB — RETICULOCYTES
ABS Retic: 62720 {cells}/uL (ref 20000–80000)
Retic Ct Pct: 1.6 %

## 2023-12-11 LAB — LIPID PANEL
Cholesterol: 325 mg/dL — ABNORMAL HIGH (ref <200)
HDL: 39 mg/dL — ABNORMAL LOW (ref >=50)
Non-HDL Cholesterol (Calc): 286 mg/dL — ABNORMAL HIGH (ref <130)
Total CHOL/HDL Ratio: 8.3 (calc) — ABNORMAL HIGH (ref <5.0)
Triglycerides: 469 mg/dL — ABNORMAL HIGH (ref <150)

## 2023-12-11 LAB — HEMOGLOBIN A1C
Hgb A1c MFr Bld: 10.1 % — ABNORMAL HIGH (ref <5.7)
Mean Plasma Glucose: 243 mg/dL
eAG (mmol/L): 13.5 mmol/L

## 2023-12-11 LAB — FOLATE: Folate: >24 ng/mL

## 2023-12-11 LAB — VITAMIN B12: Vitamin B-12: 1109 pg/mL — ABNORMAL HIGH (ref 200–1100)

## 2023-12-11 LAB — VITAMIN B1: Vitamin B1 (Thiamine): 21 nmol/L (ref 8–30)

## 2023-12-11 MED ORDER — TOUJEO MAX SOLOSTAR 300 UNIT/ML ~~LOC~~ SOPN: 30.0000 [IU] | PEN_INJECTOR | Freq: Every day | SUBCUTANEOUS | 0 refills | Status: DC

## 2023-12-11 MED ORDER — INSULIN PEN NEEDLE 32G X 6 MM MISC: 1.0000 | Freq: Every day | 1 refills | Status: AC

## 2023-12-11 MED ORDER — CONTOUR NEXT TEST VI STRP: ORAL_STRIP | 2 refills | Status: AC

## 2023-12-12 ENCOUNTER — Telehealth: Payer: Self-pay | Admitting: *Deleted

## 2023-12-12 DIAGNOSIS — K080 Exfoliation of teeth due to systemic causes: Secondary | ICD-10-CM | POA: Diagnosis not present

## 2023-12-12 NOTE — Progress Notes (Unsigned)
 Care Guide Pharmacy Note  12/12/2023 Name: Kimberly [name] MRN: [id number] DOB: 1956/01/20  Referred By: Arcadio Knuckles, MD Reason for referral: Call Attempt #1 and Complex Care Management (Outreach to schedule referral with pharmacist )   Kimberly [name] is a 68 y.o. year old female who is a primary care patient of Arcadio Knuckles, MD.  Kimberly [name] was referred to the pharmacist for assistance related to: DMII  An unsuccessful telephone outreach was attempted today to contact the patient who was referred to the pharmacy team for assistance with medication management. Additional attempts will be made to contact the patient.  Kandis Ormond, CMA [place]  [facility], [facility] Guide Direct Dial : [phone]  Fax: [phone] Website: [email or web address]

## 2023-12-13 NOTE — Progress Notes (Unsigned)
 Care Guide Pharmacy Note  12/13/2023 Name: Kimberly [name] MRN: [id number] DOB: 07/23/1955  Referred By: Arcadio Knuckles, MD Reason for referral: Call Attempt #1 and Complex Care Management (Outreach to schedule referral with pharmacist )   Kimberly [name] is a 68 y.o. year old female who is a primary care patient of Arcadio Knuckles, MD.  Kimberly [name] was referred to the pharmacist for assistance related to: DMII  A second unsuccessful telephone outreach was attempted today to contact the patient who was referred to the pharmacy team for assistance with medication management. Additional attempts will be made to contact the patient.  Kandis Ormond, CMA [place]  [facility], [facility] Guide Direct Dial : [phone]  Fax: [phone] Website: [email or web address]

## 2023-12-14 ENCOUNTER — Telehealth: Payer: Self-pay | Admitting: Internal Medicine

## 2023-12-14 NOTE — Telephone Encounter (Signed)
 Copied from CRM [id number]. Topic: Referral - Question >> Dec 14, 2023 11:06 AM Dewanda Foots wrote: Reason for CRM: Paola Bohr from [facility] got a referral for kidney diease and believes this should have been sent to nephrology.   Paola Bohr callback [phone] extension [phone]

## 2023-12-14 NOTE — Progress Notes (Signed)
 Care Guide Pharmacy Note  12/14/2023 Name: Kimberly [name] MRN: [id number] DOB: Jun 17, 1956  Referred By: Arcadio Knuckles, MD Reason for referral: Call Attempt #1, Complex Care Management (Outreach to schedule referral with pharmacist ), Call Attempt #2, and Call Attempt #3   Kimberly [name] is a 68 y.o. year old female who is a primary care patient of Arcadio Knuckles, MD.  Kimberly [name] was referred to the pharmacist for assistance related to: DMII  A third unsuccessful telephone outreach was attempted today to contact the patient who was referred to the pharmacy team for assistance with medication management. The Population Health team is pleased to engage with this patient at any time in the future upon receipt of referral and should he/she be interested in assistance from the Population Health team.  Kimberly [name], CMA [facility]  [facility], [facility] Guide Direct Dial : [phone]  Fax: [phone] Website:  [email or web address]

## 2023-12-22 ENCOUNTER — Other Ambulatory Visit: Payer: Self-pay | Admitting: Internal Medicine

## 2023-12-22 DIAGNOSIS — N184 Chronic kidney disease, stage 4 (severe): Secondary | ICD-10-CM

## 2023-12-22 NOTE — Telephone Encounter (Signed)
Can you fix the referral?

## 2024-01-04 ENCOUNTER — Other Ambulatory Visit (HOSPITAL_BASED_OUTPATIENT_CLINIC_OR_DEPARTMENT_OTHER): Payer: Self-pay | Admitting: Cardiovascular Disease

## 2024-01-04 DIAGNOSIS — I10 Essential (primary) hypertension: Secondary | ICD-10-CM

## 2024-01-05 ENCOUNTER — Ambulatory Visit (HOSPITAL_BASED_OUTPATIENT_CLINIC_OR_DEPARTMENT_OTHER): Admitting: Cardiovascular Disease

## 2024-01-16 DIAGNOSIS — N184 Chronic kidney disease, stage 4 (severe): Secondary | ICD-10-CM | POA: Diagnosis not present

## 2024-01-31 ENCOUNTER — Other Ambulatory Visit: Payer: Self-pay | Admitting: Family Medicine

## 2024-01-31 NOTE — Telephone Encounter (Signed)
 Pt has not been seen in a year. Called pt and advised needed f/u visit and labs to adjust rx regime. Pt stated she did not want to come back there and said she would f/u w/ her PCP.

## 2024-02-07 ENCOUNTER — Ambulatory Visit: Payer: Self-pay | Admitting: *Deleted

## 2024-02-07 NOTE — Telephone Encounter (Signed)
 Sounds like she needs an appointment will it be okay for her to wait until 8/27 to address this issue or do she need to see somebody as soon as possible?

## 2024-02-07 NOTE — Telephone Encounter (Signed)
 FYI Only or Action Required?: Action required by provider: request for appointment.  Patient was last seen in primary care on 12/06/2023 by Joshua Debby CROME, MD.  Called Nurse Triage reporting Blister.  Symptoms began yesterday.  Interventions attempted: Rest, hydration, or home remedies.  Symptoms are: unchanged.  Triage Disposition: See Physician Within 24 Hours  Patient/caregiver understands and will follow disposition?: No, wishes to speak with PCP            Copied from CRM #[id number]. Topic: Clinical - Red Word Triage >> Feb 07, 2024  3:17 PM [place] A wrote: [facility] that prompted transfer to Nurse Triage: Patient was doing water aerobics and the blister on her big toe popped; she is having pain. Patient is worried because she is a diabetic. Reason for Disposition  [1] Blister on foot AND [2] diabetes mellitus or peripheral vascular disease (have poor circulation)  Answer Assessment - Initial Assessment Questions No appt with PCP until Aug 27. Offered appt with other provider tomorrow. Patient declined due to being diabetic and reports she only wants to see PCP. Please advise. Recommended UC if pain or color of toe changes. CAL notified     1. APPEARANCE of BLISTER: What does it look like?     Blister skin is intact but burst  2. SIZE: How large is the blister? (e.g., inches, cm or compare to coins)     1/2 of great toe 3. LOCATION: Where are the blisters located?      Right great toe 4. WHEN: When did the blister happen?     Since doing water aerobics in pool and cement floor 5. CAUSE: What do you think caused the blister?     Doing water aerobics  6. PAIN: Does it hurt? If Yes, ask: How bad is the pain?  (Scale 0-10; or none, mild, moderate, severe)     Moderate  7. OTHER SYMPTOMS: Do you have any other symptoms? (e.g., fever)     Pain with walking.  Protocols used: Blister - Foot and Hand-A-AH

## 2024-02-14 ENCOUNTER — Ambulatory Visit: Admitting: Internal Medicine

## 2024-02-14 ENCOUNTER — Ambulatory Visit (INDEPENDENT_AMBULATORY_CARE_PROVIDER_SITE_OTHER)

## 2024-02-14 ENCOUNTER — Ambulatory Visit: Payer: Self-pay | Admitting: Internal Medicine

## 2024-02-14 VITALS — BP 138/72 | HR 77 | Temp 97.7°F | Ht 71.0 in | Wt 182.4 lb

## 2024-02-14 DIAGNOSIS — E11621 Type 2 diabetes mellitus with foot ulcer: Secondary | ICD-10-CM | POA: Diagnosis not present

## 2024-02-14 DIAGNOSIS — L03031 Cellulitis of right toe: Secondary | ICD-10-CM | POA: Diagnosis not present

## 2024-02-14 DIAGNOSIS — L97519 Non-pressure chronic ulcer of other part of right foot with unspecified severity: Secondary | ICD-10-CM

## 2024-02-14 DIAGNOSIS — I10 Essential (primary) hypertension: Secondary | ICD-10-CM | POA: Diagnosis not present

## 2024-02-14 DIAGNOSIS — K5904 Chronic idiopathic constipation: Secondary | ICD-10-CM

## 2024-02-14 MED ORDER — LINACLOTIDE 145 MCG PO CAPS: 145.0000 ug | ORAL_CAPSULE | Freq: Every day | ORAL | 5 refills | Status: AC

## 2024-02-14 MED ORDER — AMOXICILLIN-POT CLAVULANATE 875-125 MG PO TABS: 1.0000 | ORAL_TABLET | Freq: Two times a day (BID) | ORAL | 0 refills | Status: DC

## 2024-02-14 NOTE — Patient Instructions (Signed)
 Please take all new medication as prescribed  - the antibiotic  Please continue all other medications as before, and refills have been done for the Linzess   Please have the pharmacy call with any other refills you may need.  Please continue your efforts at being more active, low cholesterol diet, and weight control.  You are otherwise up to date with prevention measures today.  Please keep your appointments with your specialists as you may have planned  You will be contacted regarding the referral for: podiatry  Please go to the XRAY Department in the first floor for the x-ray testing  You will be contacted by phone if any changes need to be made immediately.  Otherwise, you will receive a letter about your results with an explanation, but please check with MyChart first.  Please also see Dr joshua in follow up as you regularly do

## 2024-02-14 NOTE — Assessment & Plan Note (Signed)
 BP Readings from Last 3 Encounters:  02/14/24 138/72  12/06/23 132/78  11/16/23 138/74   Stable, pt to continue medical treatment norvasc  10 every day, losartan  100 qd

## 2024-02-14 NOTE — Telephone Encounter (Signed)
 Patient has been scheduled to see Dr. Norleen

## 2024-02-14 NOTE — Assessment & Plan Note (Signed)
 With hx of dm - for augmentin  875 bid course

## 2024-02-14 NOTE — Assessment & Plan Note (Signed)
 Mild shallow without drainage, but for xray right great toe, and refer podiatry,  to f/u any worsening symptoms or concerns

## 2024-02-14 NOTE — Progress Notes (Signed)
 Patient ID: Kimberly [name], female   DOB: [birth year], 68 y.o.   MRN: [id number]        Chief Complaint: follow up right toe cellulitis with DFU, constipation       HPI:  Kimberly [name] is a 68 y.o. female here with c/o 1 wk onset new blister under the pad of the right great toe near the distal joint, yesterday became unroofed and now with worsening right great toe red, tender, swelling.  No prior hx of same.  Pt denies chest pain, increased sob or doe, wheezing, orthopnea, PND, increased LE swelling, palpitations, dizziness or syncope.   Pt denies polydipsia, polyuria, or new focal neuro s/s.    Pt denies fever, wt loss, night sweats, loss of appetite, or other constitutional symptoms  Denies worsening reflux, abd pain, dysphagia, n/v, or blood but does have ongoing constipation, recent linzess  145 mg sample have worked well, she is asking for new rx.         Wt Readings from Last 3 Encounters:  02/14/24 182 lb 6.4 oz (82.7 kg)  12/06/23 185 lb (83.9 kg)  11/16/23 187 lb 9.6 oz (85.1 kg)   BP Readings from Last 3 Encounters:  02/14/24 138/72  12/06/23 132/78  11/16/23 138/74         Past Medical History:  Diagnosis Date   Arthritis    Carotid bruit 03/21/2022   Cataract    OD   Chronic kidney disease    Diabetes mellitus 2001   Hyperlipidemia Dx 2012   Lower extremity edema 01/06/2021   Past Surgical History:  Procedure Laterality Date   CATARACT EXTRACTION Left    EYE SURGERY Left    Cat Sx   VAGINAL HYSTERECTOMY     AUB (surgery in [place]), prior to 2000    reports that she has never smoked. She has never been exposed to tobacco smoke. She has never used smokeless tobacco. She reports current alcohol use of about 4.0 standard drinks of alcohol per week. She reports that she does not use drugs. family history includes Breast cancer in her sister; Depression in her mother; Diabetes in her mother; Heart attack (age of onset: 54) in her father; Heart disease in her father;  Hypertension in her mother; Lung cancer in her brother; Uterine cancer in her sister. Allergies  Allergen Reactions   Ace Inhibitors Cough   Rosuvastatin      Muscle cramps   Current Outpatient Medications on File Prior to Visit  Medication Sig Dispense Refill   albuterol  (VENTOLIN  HFA) 108 (90 Base) MCG/ACT inhaler Inhale 2 puffs into the lungs every 4 (four) hours as needed. ADDITIONAL REFILLS FROM PCP 1 each 0   allopurinol  (ZYLOPRIM ) 100 MG tablet TAKE 1 TABLET BY MOUTH DAILY TO PREVENT GOUT 90 tablet 1   amLODipine  (NORVASC ) 10 MG tablet TAKE 1 TABLET BY MOUTH DAILY 90 tablet 2   aspirin  EC 81 MG tablet Take 1 tablet (81 mg total) by mouth daily. Swallow whole. 90 tablet 1   Blood Glucose Monitoring Suppl (CONTOUR NEXT MONITOR) w/Device KIT Use to test blood sugars 4 times daily. Dx: e11.9 1 kit 1   cetirizine  (ZYRTEC ) 10 MG tablet TAKE 1 TABLET BY MOUTH DAILY 30 tablet 0   colchicine  0.6 MG tablet Take 0.6 mg by mouth daily. By Artist Lloyd - take as need for gout flare up     furosemide  (LASIX ) 40 MG tablet Take 40 mg 2 tablets daily for 5 days then decrease  to 1 tablet daily. 40 tablet 3   glucose blood (CONTOUR NEXT TEST) test strip USE TO TEST BLOOD SUGARS 4 TIMES A DAY 400 strip 2   insulin  glargine, 2 Unit Dial , (TOUJEO  MAX SOLOSTAR) 300 UNIT/ML Solostar Pen Inject 30 Units into the skin daily. 9 mL 0   Insulin  Pen Needle 32G X 6 MM MISC 1 Act by Does not apply route daily. 100 each 1   KERENDIA  20 MG TABS Take 1 tablet by mouth daily.     losartan  (COZAAR ) 100 MG tablet Take 1 tablet (100 mg total) by mouth daily. 90 tablet 1   Microlet Lancets MISC USE TO CHECK BLOOD SUGARS 4 TIMES A DAY 400 each 12   ondansetron  (ZOFRAN -ODT) 4 MG disintegrating tablet Take 1 tablet (4 mg total) by mouth every 8 (eight) hours as needed for nausea or vomiting. 20 tablet 0   OZEMPIC , 0.25 OR 0.5 MG/DOSE, 2 MG/3ML SOPN Inject 0.25 mg into the skin once a week.     zinc  gluconate 50 MG tablet 1 BY  MOUTH DAILY 90 tablet 0   No current facility-administered medications on file prior to visit.        ROS:  All others reviewed and negative.  Objective        PE:  BP 138/72   Pulse 77   Temp 97.7 F (36.5 C) (Oral)   Ht 5' 11 (1.803 m)   Wt 182 lb 6.4 oz (82.7 kg)   SpO2 92%   BMI 25.44 kg/m                 Constitutional: Pt appears in NAD               HENT: Head: NCAT.                Right Ear: External ear normal.                 Left Ear: External ear normal.                Eyes: . Pupils are equal, round, and reactive to light. Conjunctivae and EOM are normal               Nose: without d/c or deformity               Neck: Neck supple. Gross normal ROM               Cardiovascular: Normal rate and regular rhythm.                 Pulmonary/Chest: Effort normal and breath sounds without rales or wheezing.                Abd:  Soft, NT, ND, + BS, no organomegaly               Neurological: Pt is alert. At baseline orientation, motor grossly intact               Skin: Skin is warm. LE edema - none, right great toe pad near joint with shallow 1 cm area unroofed blistering open wound, right great toe with 2+red, tender, swelling but no drainage, o/w neurovasc intact               Psychiatric: Pt behavior is normal without agitation   Micro: none  Cardiac tracings I have personally interpreted today:  none  Pertinent Radiological findings (summarize): none   Lab Results  Component Value Date   WBC 4.9 12/06/2023   HGB 11.4 (L) 12/06/2023   HCT 36.1 12/06/2023   PLT 194 12/06/2023   GLUCOSE 291 (H) 12/06/2023   CHOL 325 (H) 12/06/2023   TRIG 469 (H) 12/06/2023   HDL 39 (L) 12/06/2023   LDLDIRECT 162.0 10/27/2022   LDLCALC  12/06/2023     Comment:     . LDL cholesterol not calculated. Triglyceride levels greater than 400 mg/dL invalidate calculated LDL results. . Reference range: <100 . Desirable range <100 mg/dL for primary prevention;   <70 mg/dL for  patients with CHD or diabetic patients  with > or = 2 CHD risk factors. [name] LDL-C is now calculated using the Martin-Hopkins  calculation, which is a validated novel method providing  better accuracy than the Friedewald equation in the  estimation of LDL-C.  Gladis APPLETHWAITE et al. SANDREA. 7986;689(80): 2061-2068  (http://education.[facility].com/faq/FAQ164)    ALT 17 08/01/2023   AST 21 08/01/2023   NA 134 (L) 12/06/2023   K 5.1 12/06/2023   CL 101 12/06/2023   CREATININE 2.25 (H) 12/06/2023   BUN 52 (H) 12/06/2023   CO2 23 12/06/2023   TSH 1.78 08/01/2023   HGBA1C 10.1 (H) 12/06/2023   MICROALBUR 15 10/07/2022   Assessment/Plan:  Kimberly [name] is a 68 y.o. Black or African American [2] female with  has a past medical history of Arthritis, Carotid bruit (03/21/2022), Cataract, Chronic kidney disease, Diabetes mellitus (2001), Hyperlipidemia (Dx 2012), and Lower extremity edema (01/06/2021).  Chronic idiopathic constipation Mild to mod, for increased linzess  145 mg every day prn,  to f/u any worsening symptoms or concerns  Cellulitis of great toe of right foot With hx of dm - for augmentin  875 bid course  Diabetic ulcer of right great toe (HCC) Mild shallow without drainage, but for xray right great toe, and refer podiatry,  to f/u any worsening symptoms or concerns  Essential hypertension BP Readings from Last 3 Encounters:  02/14/24 138/72  12/06/23 132/78  11/16/23 138/74   Stable, pt to continue medical treatment norvasc  10 every day, losartan  100 qd  Followup: Return if symptoms worsen or fail to improve.  Lynwood Rush, MD 02/14/2024 6:35 PM [facility]

## 2024-02-14 NOTE — Assessment & Plan Note (Signed)
 Mild to mod, for increased linzess  145 mg every day prn,  to f/u any worsening symptoms or concerns

## 2024-02-19 ENCOUNTER — Other Ambulatory Visit: Payer: Self-pay | Admitting: Internal Medicine

## 2024-02-19 DIAGNOSIS — D538 Other specified nutritional anemias: Secondary | ICD-10-CM

## 2024-02-29 DIAGNOSIS — N184 Chronic kidney disease, stage 4 (severe): Secondary | ICD-10-CM | POA: Diagnosis not present

## 2024-03-07 DIAGNOSIS — I129 Hypertensive chronic kidney disease with stage 1 through stage 4 chronic kidney disease, or unspecified chronic kidney disease: Secondary | ICD-10-CM | POA: Diagnosis not present

## 2024-03-07 DIAGNOSIS — K080 Exfoliation of teeth due to systemic causes: Secondary | ICD-10-CM | POA: Diagnosis not present

## 2024-03-07 DIAGNOSIS — N1832 Chronic kidney disease, stage 3b: Secondary | ICD-10-CM | POA: Diagnosis not present

## 2024-03-07 DIAGNOSIS — E1122 Type 2 diabetes mellitus with diabetic chronic kidney disease: Secondary | ICD-10-CM | POA: Diagnosis not present

## 2024-03-07 DIAGNOSIS — D631 Anemia in chronic kidney disease: Secondary | ICD-10-CM | POA: Diagnosis not present

## 2024-03-14 ENCOUNTER — Encounter: Payer: Self-pay | Admitting: Internal Medicine

## 2024-03-14 ENCOUNTER — Ambulatory Visit (INDEPENDENT_AMBULATORY_CARE_PROVIDER_SITE_OTHER): Admitting: Internal Medicine

## 2024-03-14 VITALS — BP 154/74 | HR 77 | Temp 98.1°F | Ht 71.0 in | Wt 179.4 lb

## 2024-03-14 DIAGNOSIS — M791 Myalgia, unspecified site: Secondary | ICD-10-CM

## 2024-03-14 DIAGNOSIS — I10 Essential (primary) hypertension: Secondary | ICD-10-CM

## 2024-03-14 DIAGNOSIS — N184 Chronic kidney disease, stage 4 (severe): Secondary | ICD-10-CM

## 2024-03-14 DIAGNOSIS — Z23 Encounter for immunization: Secondary | ICD-10-CM

## 2024-03-14 DIAGNOSIS — L97519 Non-pressure chronic ulcer of other part of right foot with unspecified severity: Secondary | ICD-10-CM | POA: Diagnosis not present

## 2024-03-14 DIAGNOSIS — T466X5A Adverse effect of antihyperlipidemic and antiarteriosclerotic drugs, initial encounter: Secondary | ICD-10-CM

## 2024-03-14 DIAGNOSIS — E11621 Type 2 diabetes mellitus with foot ulcer: Secondary | ICD-10-CM

## 2024-03-14 DIAGNOSIS — R194 Change in bowel habit: Secondary | ICD-10-CM | POA: Insufficient documentation

## 2024-03-14 LAB — POCT GLYCOSYLATED HEMOGLOBIN (HGB A1C): Hemoglobin A1C: 7.7 % — AB (ref 4.0–5.6)

## 2024-03-14 NOTE — Progress Notes (Signed)
 Subjective:  Patient ID: Kimberly [name], female    DOB: 07/09/1955  Age: 68 y.o. MRN: [id number]  CC: Hypertension and Diabetes   HPI Kimberly [name] presents for f/up --  Discussed the use of AI scribe software for clinical note transcription with the patient, who gave verbal consent to proceed.  History of Present Illness Kimberly [name] is a 68 year old female who presents with concerns about her foot and constipation.  She has been experiencing a non-healing wound on her foot for about a month. She reports that the wound has a crust over it and remains open at the bottom. An x-ray was performed, and antibiotics were prescribed. She has not yet consulted a podiatrist. She was engaging in aerobic exercise in a swimming pool without water shoes but has stopped until her foot heals.  She reports significant constipation, noting that her bowels are not moving as they should. She took a Lesnet tablet this morning, which successfully facilitated a bowel movement. She is concerned about the need for regular medication to maintain bowel movements. No blood in her stool and no abdominal pain, although she mentions a tumor on her left side that occasionally causes discomfort.  She mentions a recent prescription for Ozempic  1 mg, which she plans to start taking the following Monday. She has a history of nausea and vomiting for which she was previously prescribed medication, but she has not taken it recently. She also reports rhinorrhea, which she attributes to allergies.  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 She mentions recent stress due to multiple deaths in her family, including her sister-in-law, a Sports administrator, and a Education officer, environmental from her hometown of [place].     Outpatient Medications Prior to Visit  Medication Sig Dispense Refill   albuterol  (VENTOLIN  HFA) 108 (90 Base) MCG/ACT inhaler Inhale 2 puffs into the lungs  every 4 (four) hours as needed. ADDITIONAL REFILLS FROM PCP 1 each 0   allopurinol  (ZYLOPRIM ) 100 MG tablet TAKE 1 TABLET BY MOUTH DAILY TO PREVENT GOUT 90 tablet 1   amLODipine  (NORVASC ) 10 MG tablet TAKE 1 TABLET BY MOUTH DAILY 90 tablet 2   amoxicillin -clavulanate (AUGMENTIN ) 875-125 MG tablet Take 1 tablet by mouth 2 (two) times daily. 20 tablet 0   aspirin  EC 81 MG tablet Take 1 tablet (81 mg total) by mouth daily. Swallow whole. 90 tablet 1   Blood Glucose Monitoring Suppl (CONTOUR NEXT MONITOR) w/Device KIT Use to test blood sugars 4 times daily. Dx: e11.9 1 kit 1   cetirizine  (ZYRTEC ) 10 MG tablet TAKE 1 TABLET BY MOUTH DAILY 30 tablet 0   colchicine  0.6 MG tablet Take 0.6 mg by mouth daily. By Artist Lloyd - take as need for gout flare up     furosemide  (LASIX ) 40 MG tablet Take 40 mg 2 tablets daily for 5 days then decrease to 1 tablet daily. 40 tablet 3   glucose blood (CONTOUR NEXT TEST) test strip USE TO TEST BLOOD SUGARS 4 TIMES A DAY 400 strip 2   insulin  glargine, 2 Unit Dial , (TOUJEO  MAX SOLOSTAR) 300 UNIT/ML Solostar Pen Inject 30 Units into the skin daily. 9 mL 0   Insulin  Pen Needle 32G X 6 MM MISC 1 Act by Does not apply route daily. 100 each 1   KERENDIA  20 MG TABS Take 1 tablet by mouth daily.     linaclotide  (LINZESS ) 145 MCG CAPS capsule Take 1  capsule (145 mcg total) by mouth daily before breakfast. 30 capsule 5   losartan  (COZAAR ) 100 MG tablet Take 1 tablet (100 mg total) by mouth daily. 90 tablet 1   Microlet Lancets MISC USE TO CHECK BLOOD SUGARS 4 TIMES A DAY 400 each 12   ondansetron  (ZOFRAN -ODT) 4 MG disintegrating tablet Take 1 tablet (4 mg total) by mouth every 8 (eight) hours as needed for nausea or vomiting. 20 tablet 0   OZEMPIC , 0.25 OR 0.5 MG/DOSE, 2 MG/3ML SOPN Inject 0.25 mg into the skin once a week.     zinc  gluconate 50 MG tablet TAKE 1 TABLET BY MOUTH DAILY 90 tablet 0   No facility-administered medications prior to visit.    ROS Review of Systems   Constitutional:  Negative for appetite change, chills, diaphoresis, fatigue and fever.  HENT: Negative.  Negative for trouble swallowing.   Eyes: Negative.   Respiratory:  Negative for cough, chest tightness and shortness of breath.   Cardiovascular:  Negative for chest pain, palpitations and leg swelling.  Gastrointestinal:  Positive for constipation. Negative for abdominal pain, diarrhea, nausea and vomiting.  Endocrine: Negative.   Genitourinary: Negative.  Negative for difficulty urinating and dysuria.  Musculoskeletal: Negative.  Negative for arthralgias.  Skin: Negative.   Neurological:  Negative for dizziness and weakness.  Hematological:  Negative for adenopathy. Does not bruise/bleed easily.  Psychiatric/Behavioral: Negative.      Objective:  BP (!) 154/74 (BP Location: Left Arm, Patient Position: Sitting, Cuff Size: Normal)   Pulse 77   Temp 98.1 F (36.7 C) (Oral)   Ht 5' 11 (1.803 m)   Wt 179 lb 6.4 oz (81.4 kg)   SpO2 99%   BMI 25.02 kg/m   BP Readings from Last 3 Encounters:  03/14/24 (!) 154/74  02/14/24 138/72  12/06/23 132/78    Wt Readings from Last 3 Encounters:  03/14/24 179 lb 6.4 oz (81.4 kg)  02/14/24 182 lb 6.4 oz (82.7 kg)  12/06/23 185 lb (83.9 kg)    Physical Exam Vitals reviewed.  Constitutional:      Appearance: Normal appearance.  Eyes:     General: No scleral icterus.    Conjunctiva/sclera: Conjunctivae normal.  Cardiovascular:     Rate and Rhythm: Normal rate and regular rhythm.     Heart sounds: No murmur heard.    No friction rub. No gallop.     Comments: EKG--- NSR, 75 bpm LAD LVH Septal infarct pattern has improved Pulmonary:     Effort: Pulmonary effort is normal.     Breath sounds: No stridor. No wheezing, rhonchi or rales.  Abdominal:     General: Abdomen is flat.     Palpations: There is no mass.     Tenderness: There is no abdominal tenderness. There is no guarding.     Hernia: No hernia is present.   Musculoskeletal:        General: Normal range of motion.     Cervical back: Neck supple.     Right lower leg: No edema.     Left lower leg: No edema.  Lymphadenopathy:     Cervical: No cervical adenopathy.  Skin:    General: Skin is warm and dry.  Neurological:     General: No focal deficit present.     Mental Status: She is alert. Mental status is at baseline.  Psychiatric:        Mood and Affect: Mood normal.        Behavior: Behavior  normal.     Lab Results  Component Value Date   WBC 4.9 12/06/2023   HGB 11.4 (L) 12/06/2023   HCT 36.1 12/06/2023   PLT 194 12/06/2023   GLUCOSE 291 (H) 12/06/2023   CHOL 325 (H) 12/06/2023   TRIG 469 (H) 12/06/2023   HDL 39 (L) 12/06/2023   LDLDIRECT 162.0 10/27/2022   LDLCALC  12/06/2023     Comment:     . LDL cholesterol not calculated. Triglyceride levels greater than 400 mg/dL invalidate calculated LDL results. . Reference range: <100 . Desirable range <100 mg/dL for primary prevention;   <70 mg/dL for patients with CHD or diabetic patients  with > or = 2 CHD risk factors. [name] LDL-C is now calculated using the Martin-Hopkins  calculation, which is a validated novel method providing  better accuracy than the Friedewald equation in the  estimation of LDL-C.  Gladis APPLETHWAITE et al. SANDREA. 7986;689(80): 2061-2068  (http://education.[facility].com/faq/FAQ164)    ALT 17 08/01/2023   AST 21 08/01/2023   NA 134 (L) 12/06/2023   K 5.1 12/06/2023   CL 101 12/06/2023   CREATININE 2.25 (H) 12/06/2023   BUN 52 (H) 12/06/2023   CO2 23 12/06/2023   TSH 1.78 08/01/2023   HGBA1C 7.7 (A) 03/14/2024   MICROALBUR 15 10/07/2022    MM 3D SCREENING MAMMOGRAM BILATERAL BREAST Result Date: 04/17/2023 CLINICAL DATA:  Screening. EXAM: DIGITAL SCREENING BILATERAL MAMMOGRAM WITH TOMOSYNTHESIS AND CAD TECHNIQUE: Bilateral screening digital craniocaudal and mediolateral oblique mammograms were obtained. Bilateral screening digital breast  tomosynthesis was performed. The images were evaluated with computer-aided detection. COMPARISON:  Previous exam(s). ACR Breast Density Category c: The breasts are heterogeneously dense, which may obscure small masses. FINDINGS: There are no findings suspicious for malignancy. IMPRESSION: No mammographic evidence of malignancy. A result letter of this screening mammogram will be mailed directly to the patient. RECOMMENDATION: Screening mammogram in one year. (Code:SM-B-01Y) BI-RADS CATEGORY  1: Negative. Electronically Signed   By: Toribio Agreste M.D.   On: 04/17/2023 15:33    Assessment & Plan:  Need for immunization against influenza -     Flu vaccine HIGH DOSE PF(Fluzone Trivalent)  Diabetic ulcer of right great toe (HCC)- Blood sugar is adequately well controlled. -     Ambulatory referral to Podiatry -     POCT glycosylated hemoglobin (Hb A1C)  Essential hypertension- BP is adequately well controlled. -     EKG 12-Lead  Stage 4 chronic kidney disease (HCC)- Will avoid nephrotoxic agents   Myalgia due to statin     Follow-up: Return in about 4 months (around 07/14/2024).  Debby Molt, MD

## 2024-03-14 NOTE — Patient Instructions (Signed)

## 2024-03-15 LAB — HEMOGLOBIN A1C: Hemoglobin A1C: 8.5

## 2024-03-19 ENCOUNTER — Ambulatory Visit: Admitting: Internal Medicine

## 2024-03-22 ENCOUNTER — Other Ambulatory Visit: Payer: Self-pay | Admitting: Internal Medicine

## 2024-03-22 DIAGNOSIS — Z0000 Encounter for general adult medical examination without abnormal findings: Secondary | ICD-10-CM

## 2024-03-26 ENCOUNTER — Encounter: Payer: Self-pay | Admitting: *Deleted

## 2024-03-27 ENCOUNTER — Encounter: Payer: Self-pay | Admitting: Internal Medicine

## 2024-03-27 ENCOUNTER — Ambulatory Visit (INDEPENDENT_AMBULATORY_CARE_PROVIDER_SITE_OTHER): Admitting: Podiatry

## 2024-03-27 DIAGNOSIS — M2041 Other hammer toe(s) (acquired), right foot: Secondary | ICD-10-CM | POA: Diagnosis not present

## 2024-03-27 DIAGNOSIS — S90851A Superficial foreign body, right foot, initial encounter: Secondary | ICD-10-CM

## 2024-03-27 DIAGNOSIS — M2042 Other hammer toe(s) (acquired), left foot: Secondary | ICD-10-CM | POA: Diagnosis not present

## 2024-03-27 DIAGNOSIS — E1121 Type 2 diabetes mellitus with diabetic nephropathy: Secondary | ICD-10-CM

## 2024-03-27 NOTE — Progress Notes (Signed)
 Subjective:  Patient ID: Kimberly [name], female    DOB: [birth year],  MRN: [id number]  Chief Complaint  Patient presents with   Toe Pain    Right great toe wound check pt stated that it has been like this for a few months now and she is not sure why it has not healed yet     68 y.o. female presents with the above complaint. Patient presents with right hallux foreign body.  Patient states been like this for few months has not gotten better.  She is a diabetic last A1c of 8% she said it recently got infected she was placed on antibiotic is doing much better but she still has a callus and possible foreign body she does not recall how she got it.  She denies any other acute complaints.   Review of Systems: Negative except as noted in the HPI. Denies N/V/F/Ch.  Past Medical History:  Diagnosis Date   Arthritis    Carotid bruit 03/21/2022   Cataract    OD   Chronic kidney disease    Diabetes mellitus 2001   Hyperlipidemia Dx 2012   Lower extremity edema 01/06/2021    Current Outpatient Medications:    albuterol  (VENTOLIN  HFA) 108 (90 Base) MCG/ACT inhaler, Inhale 2 puffs into the lungs every 4 (four) hours as needed. ADDITIONAL REFILLS FROM PCP, Disp: 1 each, Rfl: 0   allopurinol  (ZYLOPRIM ) 100 MG tablet, TAKE 1 TABLET BY MOUTH DAILY TO PREVENT GOUT, Disp: 90 tablet, Rfl: 1   amLODipine  (NORVASC ) 10 MG tablet, TAKE 1 TABLET BY MOUTH DAILY, Disp: 90 tablet, Rfl: 2   amoxicillin -clavulanate (AUGMENTIN ) 875-125 MG tablet, Take 1 tablet by mouth 2 (two) times daily., Disp: 20 tablet, Rfl: 0   aspirin  EC 81 MG tablet, Take 1 tablet (81 mg total) by mouth daily. Swallow whole., Disp: 90 tablet, Rfl: 1   Blood Glucose Monitoring Suppl (CONTOUR NEXT MONITOR) w/Device KIT, Use to test blood sugars 4 times daily. Dx: e11.9, Disp: 1 kit, Rfl: 1   cetirizine  (ZYRTEC ) 10 MG tablet, TAKE 1 TABLET BY MOUTH DAILY, Disp: 30 tablet, Rfl: 0   colchicine  0.6 MG tablet, Take 0.6 mg by mouth daily. By Artist Lloyd - take as need for gout flare up, Disp: , Rfl:    furosemide  (LASIX ) 40 MG tablet, Take 40 mg 2 tablets daily for 5 days then decrease to 1 tablet daily., Disp: 40 tablet, Rfl: 3   glucose blood (CONTOUR NEXT TEST) test strip, USE TO TEST BLOOD SUGARS 4 TIMES A DAY, Disp: 400 strip, Rfl: 2   insulin  glargine, 2 Unit Dial , (TOUJEO  MAX SOLOSTAR) 300 UNIT/ML Solostar Pen, Inject 30 Units into the skin daily., Disp: 9 mL, Rfl: 0   Insulin  Pen Needle 32G X 6 MM MISC, 1 Act by Does not apply route daily., Disp: 100 each, Rfl: 1   KERENDIA  20 MG TABS, Take 1 tablet by mouth daily., Disp: , Rfl:    linaclotide  (LINZESS ) 145 MCG CAPS capsule, Take 1 capsule (145 mcg total) by mouth daily before breakfast., Disp: 30 capsule, Rfl: 5   losartan  (COZAAR ) 100 MG tablet, Take 1 tablet (100 mg total) by mouth daily., Disp: 90 tablet, Rfl: 1   Microlet Lancets MISC, USE TO CHECK BLOOD SUGARS 4 TIMES A DAY, Disp: 400 each, Rfl: 12   ondansetron  (ZOFRAN -ODT) 4 MG disintegrating tablet, Take 1 tablet (4 mg total) by mouth every 8 (eight) hours as needed for nausea or vomiting., Disp: 20 tablet,  Rfl: 0   OZEMPIC , 0.25 OR 0.5 MG/DOSE, 2 MG/3ML SOPN, Inject 0.25 mg into the skin once a week., Disp: , Rfl:    zinc  gluconate 50 MG tablet, TAKE 1 TABLET BY MOUTH DAILY, Disp: 90 tablet, Rfl: 0  Social History   Tobacco Use  Smoking Status Never   Passive exposure: Never  Smokeless Tobacco Never    Allergies  Allergen Reactions   Ace Inhibitors Cough   Rosuvastatin      Muscle cramps   Objective:  There were no vitals filed for this visit. There is no height or weight on file to calculate BMI. Constitutional Well developed. Well nourished.  Vascular Dorsalis pedis pulses palpable bilaterally. Posterior tibial pulses palpable bilaterally. Capillary refill normal to all digits.  No cyanosis or clubbing noted. Pedal hair growth normal.  Neurologic Normal speech. Oriented to person, place, and  time. Epicritic sensation to light touch grossly present bilaterally.  Dermatologic Superficial ulceration noted within 2 point of the foreign body.  No other signs of infection or erythema redness swelling or drainage noted.  Orthopedic: Normal joint ROM without pain or crepitus bilaterally. No visible deformities. No bony tenderness.   Radiographs: None Assessment:   1. Diabetic nephropathy associated with type 2 diabetes mellitus (HCC)   2. Hammertoe, bilateral   3. Foreign body in right foot, initial encounter    Plan:  Patient was evaluated and treated and all questions answered.  Right hallux foreign body (splinter) - All questions and concerns were discussed with the patient in extensive detail I discussed with the patient that she would benefit from removal of splinter given that she is diabetic.  She agrees with the plan.  Using chisel blade to handle and a pickup the splinter was removed without any abnormality.  No complication noted no pinpoint bleeding noted.  Appear to be superficial.  Hammertoe bilateral with underlying diabetes - Given the presence of hammertoe deformity in setting of diabetes is uncontrolled patient will benefit from diabetic shoes prescription for diabetic shoes was given to be taken to [facility]  No follow-ups on file.

## 2024-04-10 ENCOUNTER — Other Ambulatory Visit (HOSPITAL_BASED_OUTPATIENT_CLINIC_OR_DEPARTMENT_OTHER): Payer: Self-pay | Admitting: Cardiovascular Disease

## 2024-04-11 ENCOUNTER — Encounter: Payer: Self-pay | Admitting: *Deleted

## 2024-04-12 DIAGNOSIS — K080 Exfoliation of teeth due to systemic causes: Secondary | ICD-10-CM | POA: Diagnosis not present

## 2024-04-15 ENCOUNTER — Ambulatory Visit (INDEPENDENT_AMBULATORY_CARE_PROVIDER_SITE_OTHER): Admitting: Cardiovascular Disease

## 2024-04-15 ENCOUNTER — Encounter (HOSPITAL_BASED_OUTPATIENT_CLINIC_OR_DEPARTMENT_OTHER): Payer: Self-pay | Admitting: Cardiovascular Disease

## 2024-04-15 VITALS — BP 120/60 | HR 80 | Ht 71.0 in | Wt 177.0 lb

## 2024-04-15 DIAGNOSIS — I739 Peripheral vascular disease, unspecified: Secondary | ICD-10-CM | POA: Diagnosis not present

## 2024-04-15 DIAGNOSIS — N1832 Chronic kidney disease, stage 3b: Secondary | ICD-10-CM | POA: Diagnosis not present

## 2024-04-15 DIAGNOSIS — R0982 Postnasal drip: Secondary | ICD-10-CM

## 2024-04-15 DIAGNOSIS — D631 Anemia in chronic kidney disease: Secondary | ICD-10-CM

## 2024-04-15 DIAGNOSIS — I10 Essential (primary) hypertension: Secondary | ICD-10-CM | POA: Diagnosis not present

## 2024-04-15 MED ORDER — CETIRIZINE HCL 10 MG PO TABS: 10.0000 mg | ORAL_TABLET | Freq: Every day | ORAL | 0 refills | Status: DC

## 2024-04-15 NOTE — Progress Notes (Signed)
 Cardiology Office Note:  .   Date:  04/15/2024  ID:  Kimberly [name], DOB 01-26-1956, MRN [id number] PCP: Joshua Debby CROME, MD  [facility] Providers Cardiologist:  None    History of Present Illness: Kimberly [name]    Kimberly [name] is a 68 y.o. female with hypertension, hyperlipidemia, PAD, GERD, CKD 4, diabetes, and cough-variant asthma here for follow up.  She was initially seen 09/2019 for the evaluation of chest pain shortness of breath.  She saw Dr. Swaziland 09/2019 and was noted to have a murmur on exam.  She was referred to cardiology for further evaluation.  She reported unlikely cardiac chest pain that seemed related to stress.  She was referred for an ETT 11/14/2019 that was negative for ischemia.  She achieved 8.8 metabolic equivalents.  Her blood pressure increased to 227/63.  She was started on amlodipine .  She is tolerating amlodipine  well but doesn't want to add any other medication.  At her initial visit, she increased her pravastatin  to for 5 days/week, though she did sometimes skip it because it caused muscle aches. Her lipids were not at goal but she was hesitant to increase her pravastatin . She wanted to focus on diet and exercise.     She complained of pain on her L side and bilateral LE edema. The pain radiated from her lower left side/thigh down to her left foot. She went to the ED 12/16/2020 because her symptoms all occurred on her left side. CT scan showed a possible ovarian cyst. She was following her OBGYN who performed an US  in her office. Around 11/25/2020, she was infected with COVID-19. She noted her blood pressures were high at home but controlled in the office . She was asked to bring blood pressure cuff to follow up. She reported LE swelling but doppler 12/2020 was negative for DVT. She saw her nephrologist and was started on losartan .   Her blood pressure was uncontrolled so losartan  was increased, and HCTZ was switched to chlorthalidone . She wanted to stop the statin, but  agreed to take it once per week. She wasn't interested in starting Repatha . She was started on Nexlizet . She saw her PCP the following month and her blood pressure was controlled, however it was high at follow up 01/2022 so amlodipine  was restarted.  At her visit on 02/2022 she was working on her stress by exercising.  She was noted to have a carotid bruit and referred for carotid Dopplers 03/2022 which revealed mild stenosis on the left.   At her visit 08/2022 her BP remained uncontrolled.  She was in pain 2/2 gout.  Diuretic discontinued.  [facility]. She saw Dr. Serene for PAD and her disease was felt to be mild so medical management was recommended. He advised her to consider Repatha . R ABI 085, L 0.73.    Kimberly [name] experiences high blood pressure readings at the doctor's office but does not regularly monitor her blood pressure at home. She is currently taking losartan  and amlodipine  but was recently prescribed terazosin , which she is reluctant to take. Her creatinine levels have been stable around 2.0 to 2.2 since 2022. SHe noted increased edema and was started on lasix . Echo 07/2023 revealed LVEF 55-60% with mild LVH and grade 1 diastolic dysfunction. At her visit 09/2023 she wasn't taking her medication regularly 2/2 lack of refills.     Discussed the use of AI scribe software for clinical note transcription with the patient, who gave verbal consent  to proceed.  History of Present Illness Kimberly [name] has been experiencing persistent pain in her left arm since receiving a COVID and flu shot in September. The pain is constant and localized at the injection site, without exacerbation during physical activity.  She describes a burning sensation in her right ankle, which feels warm and burning. She is taking furosemide  for swelling, which has improved in her knees but persists in her feet.  Her diabetes management includes Ozempic , which was increased to 1mg  by her kidney doctor.  She reports significant weight loss and is concerned about being 'nothing but skin and bones.' Despite this, her appetite remains good, though she feels full faster.  She has a history of high cholesterol and has experienced adverse effects from statins, such as muscle pain. She has not tried Repatha  due to a preference to avoid injections.  She mentions a past injury to her toe in a pool, which resulted in a blister and required treatment by a foot doctor. She also recalls a splinter in her toe that was shaved down.  She is currently not taking aspirin , having run out and not refilled it. She questions whether she should resume taking it.  No shortness of breath when lying down but experiences it when walking up stairs. She continues to exercise regularly and feels good during workouts.  ROS:  As per HPI  Studies Reviewed: .       Echo 07/2023:  1. Left ventricular ejection fraction, by estimation, is 55 to 60%. The  left ventricle has normal function. The left ventricle has no regional  wall motion abnormalities. There is mild left ventricular hypertrophy.  Left ventricular diastolic parameters  are consistent with Grade I diastolic dysfunction (impaired relaxation).  Elevated left ventricular end-diastolic pressure.   2. Right ventricular systolic function is normal. The right ventricular  size is normal.   3. The mitral valve is abnormal. Trivial mitral valve regurgitation. No  evidence of mitral stenosis.   4. The aortic valve is tricuspid. There is moderate calcification of the  aortic valve. There is moderate thickening of the aortic valve. Aortic  valve regurgitation is trivial. Aortic valve sclerosis/calcification is  present, without any evidence of  aortic stenosis.   5. The inferior vena cava is normal in size with greater than 50%  respiratory variability, suggesting right atrial pressure of 3 mmHg.   ABI 10/2022: Summary:  Right: Resting right ankle-brachial index  indicates mild right lower  extremity arterial disease. The right toe-brachial index is abnormal.   Left: Resting left ankle-brachial index indicates moderate left lower  extremity arterial disease. The left toe-brachial index is abnormal.  Risk Assessment/Calculations:             Physical Exam:   VS:  BP 120/60 (BP Location: Left Arm, Patient Position: Sitting, Cuff Size: Normal)   Pulse 80   Ht 5' 11 (1.803 m)   Wt 177 lb (80.3 kg)   BMI 24.69 kg/m  , BMI Body mass index is 24.69 kg/m. GENERAL:  Well appearing HEENT: Pupils equal round and reactive, fundi not visualized, oral mucosa unremarkable NECK:  No jugular venous distention, waveform within normal limits, carotid upstroke brisk and symmetric, no bruits, no thyromegaly LUNGS:  Clear to auscultation bilaterally HEART:  RRR.  PMI not displaced or sustained,S1 and S2 within normal limits, no S3, no S4, no clicks, no rubs, no murmurs ABD:  Flat, positive bowel sounds normal in frequency in pitch, no bruits, no rebound, no  guarding, no midline pulsatile mass, no hepatomegaly, no splenomegaly EXT:  2 plus pulses throughout, trace LE edema, no cyanosis no clubbing SKIN:  No rashes no nodules NEURO:  Cranial nerves II through XII grossly intact, motor grossly intact throughout PSYCH:  Cognitively intact, oriented to person place and time   ASSESSMENT AND PLAN: .    Assessment & Plan # Peripheral vascular disease Peripheral vascular disease with reduced leg blood flow. ABIs as above.  Discussed managing blood pressure, cholesterol, and blood sugar to improve circulation and prevent complications. - Advise continued exercise to improve circulation. - We discussed Repatha  and she is not interested in doing self injections.  Will work to get her on Inclisiran.  She has not tolerated statins.  Resume aspirin .   # Edema of lower extremities Swelling improves with leg elevation. No signs of heart failure-related fluid retention.  Current diuretic therapy with furosemide .  Given her chronic kidney disease would like to avoid escalation of diuretics if possible. - Advise leg elevation to reduce swelling.  Compression socks. - Monitor salt intake.  # Chronic kidney disease, stage 4: Chronic kidney disease stage 4 managed with Ozempic .  Blood pressure control is excellent.  She did not tolerate Farxiga .  Continue losartan  and nephrology follow-up.  Continue Karendia.   # Type 2 diabetes mellitus Type 2 diabetes mellitus with improved control, A1c decreased to 7.7.  Still not at goal.  Current treatment includes Ozempic , well-tolerated and beneficial for cardiovascular health. - Continue Ozempic  as prescribed.  She is on insulin  as well.  Management per primary care.  # Primary hypertension Blood pressure well-controlled at 120/68 with lifestyle modifications and medication. - Continue monitoring blood pressure. - Continue amlodipine  losartan , and amlodipine  was reduced which did help her swelling.  # Hyperlipidemia Hyperlipidemia with elevated LDL and triglycerides. Statins not tolerated due to muscle pain. Discussed alternative treatments including inclisiran and Nexlizet . She is open to inclisiran pending insurance approval. - Investigate insurance coverage for inclisiran. - Consider Nexlizet  if inclisiran is not feasible.  # Left arm pain post-vaccination Persistent left arm pain since COVID and flu vaccinations in September. Pain constant at injection site, not activity-related.       Dispo: f/u 6 months  Signed, Annabella Scarce, MD

## 2024-04-15 NOTE — Patient Instructions (Addendum)
 Medication Instructions:  WILL WORK ON GETTING YOU STARTED ON LEQVIO  *If you need a refill on your cardiac medications before your next appointment, please call your pharmacy*  Lab Work: TO BE DETERMINED AFTER STARTING LEQVIO   If you have labs (blood work) drawn today and your tests are completely normal, you will receive your results only by: MyChart Message (if you have MyChart) OR A paper copy in the mail If you have any lab test that is abnormal or we need to change your treatment, we will call you to review the results.  Testing/Procedures: NONE  Follow-Up: At [facility], you and your health needs are our priority.  As part of our continuing mission to provide you with exceptional heart care, our providers are all part of one team.  This team includes your primary Cardiologist (physician) and Advanced Practice Providers or APPs (Physician Assistants and Nurse Practitioners) who all work together to provide you with the care you need, when you need it.  Your next appointment:   6 month(s)  Provider:   Annabella Scarce, MD, Rosaline Bane, NP, or Reche Finder, NP     We recommend signing up for the patient portal called MyChart.  Sign up information is provided on this After Visit Summary.  MyChart is used to connect with patients for Virtual Visits (Telemedicine).  Patients are able to view lab/test results, encounter notes, upcoming appointments, etc.  Non-urgent messages can be sent to your provider as well.   To learn more about what you can do with MyChart, go to [email or web address].   Other Instructions WILL REFILL YOUR CETIRIZINE  1 TIME ONLY, ADDITIONAL REFILLS FROM YOUR PRIMARY CARE   Inclisiran Injection What is this medication? INCLISIRAN (in kli SIR an) treats high cholesterol. It works by decreasing bad cholesterol (such as LDL) in your blood. Changes to diet and exercise are often combined with this medication. This medicine may be used for  other purposes; ask your health care provider or pharmacist if you have questions. COMMON BRAND NAME(S): LEQVIO What should I tell my care team before I take this medication? They need to know if you have any of these conditions: An unusual or allergic reaction to inclisiran, other medications, foods, dyes, or preservatives Pregnant or trying to get pregnant Breast-feeding How should I use this medication? This medication is injected under the skin. It is given by your care team in a hospital or clinic setting. Talk to your care team about the use of this medication in children. Special care may be needed. Overdosage: If you think you have taken too much of this medicine contact a poison control center or emergency room at once. NOTE: This medicine is only for you. Do not share this medicine with others. What if I miss a dose? Keep appointments for follow-up doses. It is important not to miss your dose. Call your care team if you are unable to keep an appointment. What may interact with this medication? Interactions are not expected. This list may not describe all possible interactions. Give your health care provider a list of all the medicines, herbs, non-prescription drugs, or dietary supplements you use. Also tell them if you smoke, drink alcohol, or use illegal drugs. Some items may interact with your medicine. What should I watch for while using this medication? Visit your care team for regular checks on your progress. Tell your care team if your symptoms do not start to get better or if they get worse. You may need  blood work while you are taking this medication. What side effects may I notice from receiving this medication? Side effects that you should report to your care team as soon as possible: Allergic reactions--skin rash, itching, hives, swelling of the face, lips, tongue, or throat Side effects that usually do not require medical attention (report these to your care team if they  continue or are bothersome): Joint pain Pain, redness, or irritation at injection site This list may not describe all possible side effects. Call your doctor for medical advice about side effects. You may report side effects to FDA at 1-800-FDA-1088. Where should I keep my medication? This medication is given in a hospital or clinic. It will not be stored at home. NOTE: This sheet is a summary. It may not cover all possible information. If you have questions about this medicine, talk to your doctor, pharmacist, or health care provider.  2024 Elsevier/Gold Standard (2023-05-26 00:00:00)

## 2024-04-18 ENCOUNTER — Telehealth: Payer: Self-pay | Admitting: Pharmacy Technician

## 2024-04-18 NOTE — Telephone Encounter (Signed)
 Dr. Raford,  Please enter the treatment plan for Leqvio and patient will be scheduled as soon as possible.  Thanks Kim.  Auth Submission: NO AUTH NEEDED Site of care: Site of care: [facility] Payer: BCBS MEDICARE Medication & CPT/J Code(s) submitted: Leqvio (Inclisiran) [id number] Diagnosis Code:  Route of submission (phone, fax, portal):  Phone # Fax # Auth type: Buy/Bill PB Units/visits requested: 284MG  Q3 MONTHS X2 DOSES, THEN Q6 MONTHS Reference number: [id number] Approval from: 04/18/24 to 06/26/24    [facility]: atlas aware

## 2024-04-18 NOTE — Telephone Encounter (Addendum)
 Auth Submission: NO AUTH NEEDED Site of care: Site of care: [facility] Payer: BCBS Medication & CPT/J Code(s) submitted: Leqvio (Inclisiran) [id number] Diagnosis Code: E78.5 Route of submission (phone, fax, portal): FAXED Phone # Fax # Auth type: Buy/Bill PB Units/visits requested: 284MG  Q3 MONTHS X2, THEN Q6MONTHS Reference number: [id number]  time stamp 9:17a Approval from:

## 2024-04-23 ENCOUNTER — Ambulatory Visit
Admission: RE | Admit: 2024-04-23 | Discharge: 2024-04-23 | Disposition: A | Source: Ambulatory Visit | Attending: Internal Medicine | Admitting: Internal Medicine

## 2024-04-23 DIAGNOSIS — Z0000 Encounter for general adult medical examination without abnormal findings: Secondary | ICD-10-CM

## 2024-04-26 ENCOUNTER — Other Ambulatory Visit: Payer: Self-pay | Admitting: Pharmacist Clinician (PhC)/ Clinical Pharmacy Specialist

## 2024-04-26 NOTE — Progress Notes (Signed)
 Leqvio order placed

## 2024-04-29 ENCOUNTER — Encounter: Payer: Self-pay | Admitting: Internal Medicine

## 2024-04-30 NOTE — Telephone Encounter (Signed)
 Dr. Raford, Nothing else is needed at this time.  Our pharmacist entered the treatment plan.  We will schedule patient as soon as possible.  (My-Chart message and voice mail message has been sent to patient for scheduling)

## 2024-05-10 ENCOUNTER — Other Ambulatory Visit: Payer: Self-pay | Admitting: Internal Medicine

## 2024-05-10 DIAGNOSIS — E119 Type 2 diabetes mellitus without complications: Secondary | ICD-10-CM

## 2024-05-16 ENCOUNTER — Other Ambulatory Visit (HOSPITAL_BASED_OUTPATIENT_CLINIC_OR_DEPARTMENT_OTHER): Payer: Self-pay | Admitting: *Deleted

## 2024-05-22 ENCOUNTER — Other Ambulatory Visit: Payer: Self-pay | Admitting: Internal Medicine

## 2024-05-22 DIAGNOSIS — E60 Dietary zinc deficiency: Secondary | ICD-10-CM

## 2024-05-28 ENCOUNTER — Ambulatory Visit: Admitting: Internal Medicine

## 2024-06-07 ENCOUNTER — Telehealth: Payer: Self-pay

## 2024-06-07 NOTE — Telephone Encounter (Signed)
 Patient was identified as falling into the [place] Measure - Diabetes.   Patient was: Attribution and/or data issue.  Validation/Investigation needed.  Explanation:  Patient is seeing provider at [facility]. LMOM for patient to call and schedule at [facility].  Patient needs an appt for Diabetes follow up before year end if possible.

## 2024-06-12 ENCOUNTER — Telehealth: Payer: Self-pay

## 2024-06-12 NOTE — Telephone Encounter (Signed)
 Kimberly [name] called to schedule a follow up with Dr. Viktoria.  Her last visit was in person with Dr.Tucker was 03/22/23, She was scheduled for surgery and changed her mind. She cancelled her follow up phone call scheduled for 05/11/23  First available for Dr.Tucker is April. Pt is scheduled to see Dr.Jackson-Moore on 1/14 @ 10:00. Pt agrees to date/time

## 2024-06-14 ENCOUNTER — Other Ambulatory Visit (HOSPITAL_BASED_OUTPATIENT_CLINIC_OR_DEPARTMENT_OTHER): Payer: Self-pay | Admitting: Cardiovascular Disease

## 2024-06-14 DIAGNOSIS — R0982 Postnasal drip: Secondary | ICD-10-CM

## 2024-07-03 ENCOUNTER — Telehealth: Payer: Self-pay

## 2024-07-03 NOTE — Telephone Encounter (Signed)
 Unable to reach patient I advised her via Vm that she would have to schedule an appointment before she could receive any medication. If the patient calls back please get her scheduled for an appointment with somebody that has something soon.

## 2024-07-03 NOTE — Telephone Encounter (Signed)
 Copied from CRM #[id number]. Topic: Clinical - Medical Advice >> Jul 03, 2024  8:31 AM Robinson H wrote: Reason for CRM: Patient states she went on a cruise and has a nagging cough, wants to know if she can have some cough syrup called in to the pharmacy on file.  Lebron [phone]

## 2024-07-04 ENCOUNTER — Ambulatory Visit: Admitting: Nurse Practitioner

## 2024-07-08 ENCOUNTER — Telehealth: Payer: Self-pay

## 2024-07-08 NOTE — Telephone Encounter (Signed)
 Spoke with patient and rescheduled appointment from 1/14 to 1/28 at 10:45am.. Patient wasn't happy about moving the appointment and asked to see Dr Viktoria instead.. I advised Dr Viktoria was booked out and it would be a while.  She confirmed and understood the appointment has been moved to 1/28.Kimberly [name]

## 2024-07-09 ENCOUNTER — Encounter: Payer: Self-pay | Admitting: Family Medicine

## 2024-07-09 ENCOUNTER — Ambulatory Visit (INDEPENDENT_AMBULATORY_CARE_PROVIDER_SITE_OTHER): Admitting: Family Medicine

## 2024-07-09 VITALS — BP 162/80 | Temp 98.0°F | Ht 71.0 in | Wt 183.0 lb

## 2024-07-09 DIAGNOSIS — I10 Essential (primary) hypertension: Secondary | ICD-10-CM | POA: Diagnosis not present

## 2024-07-09 DIAGNOSIS — B9689 Other specified bacterial agents as the cause of diseases classified elsewhere: Secondary | ICD-10-CM

## 2024-07-09 DIAGNOSIS — J3089 Other allergic rhinitis: Secondary | ICD-10-CM

## 2024-07-09 DIAGNOSIS — M109 Gout, unspecified: Secondary | ICD-10-CM | POA: Diagnosis not present

## 2024-07-09 DIAGNOSIS — R051 Acute cough: Secondary | ICD-10-CM | POA: Diagnosis not present

## 2024-07-09 DIAGNOSIS — R0982 Postnasal drip: Secondary | ICD-10-CM

## 2024-07-09 DIAGNOSIS — J208 Acute bronchitis due to other specified organisms: Secondary | ICD-10-CM | POA: Diagnosis not present

## 2024-07-09 LAB — POC COVID19 BINAXNOW: SARS Coronavirus 2 Ag: NEGATIVE

## 2024-07-09 LAB — POCT INFLUENZA A/B
Influenza A, POC: NEGATIVE
Influenza B, POC: NEGATIVE

## 2024-07-09 MED ORDER — CETIRIZINE HCL 10 MG PO TABS: 10.0000 mg | ORAL_TABLET | Freq: Every day | ORAL | 0 refills | Status: AC

## 2024-07-09 MED ORDER — ALBUTEROL SULFATE HFA 108 (90 BASE) MCG/ACT IN AERS: 2.0000 | INHALATION_SPRAY | RESPIRATORY_TRACT | 0 refills | Status: AC | PRN

## 2024-07-09 MED ORDER — AZITHROMYCIN 250 MG PO TABS: ORAL_TABLET | ORAL | 0 refills | Status: AC

## 2024-07-09 MED ORDER — COLCHICINE 0.6 MG PO TABS: 0.6000 mg | ORAL_TABLET | Freq: Every day | ORAL | 3 refills | Status: AC

## 2024-07-09 NOTE — Progress Notes (Signed)
 "  Acute Office Visit  Subjective:     Patient ID: Kimberly [name], female    DOB: 01-28-1956, 69 y.o.   MRN: [id number]  No chief complaint on file.   HPI  Discussed the use of AI scribe software for clinical note transcription with the patient, who gave verbal consent to proceed.  History of Present Illness Kimberly [name] is a 69 year old female with asthma who presents with a persistent cough and cold symptoms.  Cough and upper respiratory symptoms - Persistent cough since cruise to the [place], ongoing since June 30, 2024 - Sensation of something stuck in throat - Nasal drainage running down the back of throat - Frequent nose blowing - No fever above 87F - Sister and cabin mate also had cough, which resolved sooner  Constitutional and head symptoms - Body aches and feeling very sick during cruise - Current head pain associated with feeling feverish, but home temperatures have been normal - No facial pressure - No ear pain  Asthma - History of asthma - Currently out of inhaler and requests refill  Gout - History of gout - Currently out of colchicine  and requests refill  Musculoskeletal symptoms - Leg cramps during cruise, improved with exercise  Allergies and medication use - Allergic to statins and ACE inhibitors such as lisinopril - No known antibiotic allergies - Takes cetirizine  for allergies and requests refill  Diet and medication interactions - Eating grapefruit to help with cold symptoms despite awareness of potential interaction with blood pressure medication  Hypertension - Blood pressure elevated at today's visit     ROS Per HPI      Objective:    BP (!) 162/80 (BP Location: Left Arm, Patient Position: Sitting)   Temp 98 F (36.7 C) (Temporal)   Ht 5' 11 (1.803 m)   Wt 183 lb (83 kg)   BMI 25.52 kg/m    Physical Exam Vitals and nursing note reviewed.  Constitutional:      General: She is not in acute distress.     Appearance: Normal appearance.     Comments: Appears fatigued   HENT:     Head: Normocephalic and atraumatic.     Right Ear: External ear normal.     Left Ear: External ear normal.     Nose: Congestion present.     Mouth/Throat:     Mouth: Mucous membranes are moist.     Comments: Oropharyngeal cobblestoning   Eyes:     Extraocular Movements: Extraocular movements intact.     Pupils: Pupils are equal, round, and reactive to light.  Cardiovascular:     Rate and Rhythm: Normal rate and regular rhythm.     Pulses: Normal pulses.     Heart sounds: Normal heart sounds.  Pulmonary:     Effort: Pulmonary effort is normal. No respiratory distress.     Breath sounds: Normal breath sounds. No wheezing, rhonchi or rales.     Comments: Dry cough Musculoskeletal:        General: Normal range of motion.     Cervical back: Normal range of motion.     Right lower leg: No edema.     Left lower leg: No edema.  Lymphadenopathy:     Cervical: Cervical adenopathy present.  Neurological:     General: No focal deficit present.     Mental Status: She is alert and oriented to person, place, and time.  Psychiatric:        Mood and Affect:  Mood normal.        Thought Content: Thought content normal.     Results for orders placed or performed in visit on 07/09/24  POC COVID-19 BinaxNow  Result Value Ref Range   SARS Coronavirus 2 Ag Negative Negative  POCT Influenza A/B  Result Value Ref Range   Influenza A, POC Negative Negative   Influenza B, POC Negative Negative        Assessment & Plan:   Assessment and Plan Assessment & Plan Acute bacterial bronchitis with asthma exacerbation Persistent cough and nasal drainage likely progressed to bacterial bronchitis with asthma exacerbation triggered by viral infection. Azithromycin  chosen for bacterial bronchitis and anti-inflammatory benefits. - Prescribed azithromycin  (Z-Pak): 2 tablets on day 1, then 1 tablet daily for 4 days. - Prescribed  albuterol  inhaler. - Advised against colchicine  with azithromycin . - Recommended humidifier use at night.  Gout No recent flare-ups of gout. Out of colchicine . - Refilled colchicine  prescription.  Essential Hypertension Elevated blood pressure possibly due to illness or grapefruit consumption affecting medication efficacy. - Advised home blood pressure monitoring. - Discussed grapefruit impact on medication.  Allergic rhinitis Nasal drainage and congestion likely worsened by viral infection. Out of cetirizine . - Refilled cetirizine  prescription.     Orders Placed This Encounter  Procedures   POC COVID-19 BinaxNow   POCT Influenza A/B     Meds ordered this encounter  Medications   albuterol  (VENTOLIN  HFA) 108 (90 Base) MCG/ACT inhaler    Sig: Inhale 2 puffs into the lungs every 4 (four) hours as needed. ADDITIONAL REFILLS FROM PCP    Dispense:  6.7 each    Refill:  0   azithromycin  (ZITHROMAX ) 250 MG tablet    Sig: Take 2 tablets on day 1, then 1 tablet daily on days 2 through 5    Dispense:  6 tablet    Refill:  0   cetirizine  (ZYRTEC ) 10 MG tablet    Sig: Take 1 tablet (10 mg total) by mouth daily. FURTHER REFILLS FROM YOUR PRIMARY CARE    Dispense:  30 tablet    Refill:  0    No more refills until OV   colchicine  0.6 MG tablet    Sig: Take 1 tablet (0.6 mg total) by mouth daily. By Artist Lloyd - take as need for gout flare up    Dispense:  12 tablet    Refill:  3    Return if symptoms worsen or fail to improve.  [name], FNP  "

## 2024-07-09 NOTE — Patient Instructions (Signed)
 COVID and flu testing were negative today.  I have sent in azithromycin  for you to take. Take 2 tablets today, then 1 tablet daily for the next 4 days.  DO NOT take colchicine  with azithromycin .  Refilled colchicine  today  I have sent in an albuterol  inhaler for you to use 2 puffs every 4 hours as needed for wheezing.  Refilled cetirizine  today.   Follow-up with me for new or worsening symptoms.

## 2024-07-10 ENCOUNTER — Inpatient Hospital Stay: Admitting: Obstetrics & Gynecology

## 2024-07-17 ENCOUNTER — Encounter: Payer: Self-pay | Admitting: Pharmacist

## 2024-07-17 NOTE — Progress Notes (Signed)
 Pharmacy Quality Measure Review  This patient is appearing on the insurance-providing list for being at risk of failing the adherence measure for Statin Use in Persons with Diabetes (SUPD) medications this calendar year.   Per review of chart and payor information, patient has filled a medication indicated for diabetes this calendar year but is not currently filling a statin prescription.    Pt has hx of intolerance to statin. Have added adverse effect to antihyperlipidemic drug to upcoming PCP appt 07/25/24 for their review.  Darrelyn Drum, PharmD, BCPS, CPP Clinical Pharmacist Practitioner [facility] [phone]

## 2024-07-24 ENCOUNTER — Inpatient Hospital Stay: Admitting: Obstetrics & Gynecology

## 2024-07-25 ENCOUNTER — Ambulatory Visit: Admitting: Internal Medicine

## 2024-07-25 DIAGNOSIS — T466X5A Adverse effect of antihyperlipidemic and antiarteriosclerotic drugs, initial encounter: Secondary | ICD-10-CM

## 2024-07-31 ENCOUNTER — Other Ambulatory Visit: Payer: Self-pay | Admitting: Internal Medicine

## 2024-07-31 DIAGNOSIS — E119 Type 2 diabetes mellitus without complications: Secondary | ICD-10-CM

## 2024-08-02 ENCOUNTER — Encounter: Payer: Self-pay | Admitting: Obstetrics & Gynecology

## 2024-08-07 ENCOUNTER — Inpatient Hospital Stay: Admitting: Obstetrics & Gynecology

## 2024-08-07 DIAGNOSIS — N9489 Other specified conditions associated with female genital organs and menstrual cycle: Secondary | ICD-10-CM

## 2024-08-22 ENCOUNTER — Ambulatory Visit: Admitting: Internal Medicine

## 2024-11-15 ENCOUNTER — Ambulatory Visit
